# Patient Record
Sex: Male | Born: 1950 | Race: White | Hispanic: No | Marital: Married | Smoking: Former smoker
Health system: Southern US, Community
[De-identification: ages and names within clinical notes are randomized; demographics above are authoritative.]

## PROBLEM LIST (undated history)

## (undated) DIAGNOSIS — M1990 Unspecified osteoarthritis, unspecified site: Secondary | ICD-10-CM

## (undated) DIAGNOSIS — R351 Nocturia: Secondary | ICD-10-CM

## (undated) DIAGNOSIS — K219 Gastro-esophageal reflux disease without esophagitis: Secondary | ICD-10-CM

## (undated) DIAGNOSIS — M7030 Other bursitis of elbow, unspecified elbow: Secondary | ICD-10-CM

## (undated) DIAGNOSIS — R350 Frequency of micturition: Secondary | ICD-10-CM

## (undated) DIAGNOSIS — Z87448 Personal history of other diseases of urinary system: Secondary | ICD-10-CM

## (undated) DIAGNOSIS — K5730 Diverticulosis of large intestine without perforation or abscess without bleeding: Secondary | ICD-10-CM

## (undated) DIAGNOSIS — T8859XA Other complications of anesthesia, initial encounter: Secondary | ICD-10-CM

## (undated) DIAGNOSIS — G629 Polyneuropathy, unspecified: Secondary | ICD-10-CM

## (undated) DIAGNOSIS — J189 Pneumonia, unspecified organism: Secondary | ICD-10-CM

## (undated) DIAGNOSIS — Z8719 Personal history of other diseases of the digestive system: Secondary | ICD-10-CM

## (undated) DIAGNOSIS — Z973 Presence of spectacles and contact lenses: Secondary | ICD-10-CM

## (undated) DIAGNOSIS — F411 Generalized anxiety disorder: Secondary | ICD-10-CM

## (undated) DIAGNOSIS — Q600 Renal agenesis, unilateral: Secondary | ICD-10-CM

## (undated) DIAGNOSIS — Z87438 Personal history of other diseases of male genital organs: Secondary | ICD-10-CM

## (undated) DIAGNOSIS — E785 Hyperlipidemia, unspecified: Secondary | ICD-10-CM

## (undated) DIAGNOSIS — Z9889 Other specified postprocedural states: Secondary | ICD-10-CM

## (undated) DIAGNOSIS — R112 Nausea with vomiting, unspecified: Secondary | ICD-10-CM

## (undated) DIAGNOSIS — I2510 Atherosclerotic heart disease of native coronary artery without angina pectoris: Secondary | ICD-10-CM

## (undated) DIAGNOSIS — N201 Calculus of ureter: Secondary | ICD-10-CM

## (undated) DIAGNOSIS — D3502 Benign neoplasm of left adrenal gland: Secondary | ICD-10-CM

## (undated) DIAGNOSIS — G2581 Restless legs syndrome: Secondary | ICD-10-CM

## (undated) DIAGNOSIS — Z8489 Family history of other specified conditions: Secondary | ICD-10-CM

## (undated) DIAGNOSIS — T4145XA Adverse effect of unspecified anesthetic, initial encounter: Secondary | ICD-10-CM

## (undated) DIAGNOSIS — IMO0002 Reserved for concepts with insufficient information to code with codable children: Secondary | ICD-10-CM

## (undated) DIAGNOSIS — Z87442 Personal history of urinary calculi: Secondary | ICD-10-CM

## (undated) DIAGNOSIS — I10 Essential (primary) hypertension: Secondary | ICD-10-CM

## (undated) DIAGNOSIS — Z972 Presence of dental prosthetic device (complete) (partial): Secondary | ICD-10-CM

## (undated) DIAGNOSIS — K08109 Complete loss of teeth, unspecified cause, unspecified class: Secondary | ICD-10-CM

## (undated) DIAGNOSIS — Z8669 Personal history of other diseases of the nervous system and sense organs: Secondary | ICD-10-CM

## (undated) HISTORY — PX: KNEE ARTHROSCOPY: SUR90

## (undated) HISTORY — PX: ANTERIOR CERVICAL DECOMP/DISCECTOMY FUSION: SHX1161

## (undated) HISTORY — PX: HIP ARTHROSCOPY W/ LABRAL DEBRIDEMENT: SHX1749

## (undated) HISTORY — PX: OTHER SURGICAL HISTORY: SHX169

## (undated) HISTORY — PX: LUMBAR SPINE SURGERY: SHX701

## (undated) HISTORY — PX: TOTAL KNEE ARTHROPLASTY: SHX125

## (undated) HISTORY — PX: SHOULDER ARTHROSCOPY WITH SUBACROMIAL DECOMPRESSION, ROTATOR CUFF REPAIR AND BICEP TENDON REPAIR: SHX5687

---

## 2000-04-20 ENCOUNTER — Inpatient Hospital Stay (HOSPITAL_COMMUNITY): Admission: EM | Admit: 2000-04-20 | Discharge: 2000-04-23 | Payer: Self-pay | Admitting: Emergency Medicine

## 2000-04-20 DIAGNOSIS — Z8719 Personal history of other diseases of the digestive system: Secondary | ICD-10-CM

## 2000-04-20 HISTORY — DX: Personal history of other diseases of the digestive system: Z87.19

## 2001-02-20 ENCOUNTER — Encounter: Admission: RE | Admit: 2001-02-20 | Discharge: 2001-04-19 | Payer: Self-pay | Admitting: Orthopedic Surgery

## 2001-04-20 ENCOUNTER — Encounter: Admission: RE | Admit: 2001-04-20 | Discharge: 2001-04-20 | Payer: Self-pay | Admitting: Orthopedic Surgery

## 2001-05-18 ENCOUNTER — Encounter: Admission: RE | Admit: 2001-05-18 | Discharge: 2001-06-19 | Payer: Self-pay | Admitting: Orthopedic Surgery

## 2001-10-02 ENCOUNTER — Encounter: Payer: Self-pay | Admitting: Orthopedic Surgery

## 2001-10-04 ENCOUNTER — Ambulatory Visit: Admission: RE | Admit: 2001-10-04 | Discharge: 2001-10-04 | Payer: Self-pay | Admitting: Orthopedic Surgery

## 2001-10-09 ENCOUNTER — Inpatient Hospital Stay (HOSPITAL_COMMUNITY): Admission: RE | Admit: 2001-10-09 | Discharge: 2001-10-13 | Payer: Self-pay | Admitting: Orthopedic Surgery

## 2001-10-25 ENCOUNTER — Encounter: Admission: RE | Admit: 2001-10-25 | Discharge: 2002-01-23 | Payer: Self-pay | Admitting: Orthopedic Surgery

## 2001-11-28 ENCOUNTER — Observation Stay (HOSPITAL_COMMUNITY): Admission: RE | Admit: 2001-11-28 | Discharge: 2001-11-29 | Payer: Self-pay | Admitting: Orthopedic Surgery

## 2002-06-27 ENCOUNTER — Ambulatory Visit (HOSPITAL_BASED_OUTPATIENT_CLINIC_OR_DEPARTMENT_OTHER): Admission: RE | Admit: 2002-06-27 | Discharge: 2002-06-28 | Payer: Self-pay | Admitting: Orthopedic Surgery

## 2002-07-09 ENCOUNTER — Encounter: Admission: RE | Admit: 2002-07-09 | Discharge: 2002-08-23 | Payer: Self-pay | Admitting: Orthopedic Surgery

## 2003-08-06 ENCOUNTER — Encounter: Admission: RE | Admit: 2003-08-06 | Discharge: 2003-08-06 | Payer: Self-pay | Admitting: Orthopedic Surgery

## 2004-06-08 ENCOUNTER — Ambulatory Visit (HOSPITAL_COMMUNITY): Admission: RE | Admit: 2004-06-08 | Discharge: 2004-06-08 | Payer: Self-pay | Admitting: Gastroenterology

## 2006-05-17 ENCOUNTER — Encounter: Admission: RE | Admit: 2006-05-17 | Discharge: 2006-05-17 | Payer: Self-pay | Admitting: Orthopedic Surgery

## 2006-06-23 ENCOUNTER — Ambulatory Visit (HOSPITAL_COMMUNITY)
Admission: RE | Admit: 2006-06-23 | Discharge: 2006-06-23 | Payer: Self-pay | Admitting: Physical Medicine and Rehabilitation

## 2006-08-12 ENCOUNTER — Ambulatory Visit (HOSPITAL_COMMUNITY): Admission: RE | Admit: 2006-08-12 | Discharge: 2006-08-12 | Payer: Self-pay | Admitting: Orthopedic Surgery

## 2009-03-03 ENCOUNTER — Encounter: Admission: RE | Admit: 2009-03-03 | Discharge: 2009-03-03 | Payer: Self-pay | Admitting: Family Medicine

## 2009-05-21 ENCOUNTER — Inpatient Hospital Stay (HOSPITAL_COMMUNITY): Admission: RE | Admit: 2009-05-21 | Discharge: 2009-05-23 | Payer: Self-pay | Admitting: Neurosurgery

## 2010-11-23 ENCOUNTER — Other Ambulatory Visit: Payer: Self-pay | Admitting: Neurosurgery

## 2010-11-23 DIAGNOSIS — M542 Cervicalgia: Secondary | ICD-10-CM

## 2010-11-25 ENCOUNTER — Ambulatory Visit
Admission: RE | Admit: 2010-11-25 | Discharge: 2010-11-25 | Disposition: A | Discharge status: Home / Self Care | Payer: Medicare Other | Source: Ambulatory Visit | Attending: Neurosurgery | Admitting: Neurosurgery

## 2010-11-25 DIAGNOSIS — M542 Cervicalgia: Secondary | ICD-10-CM

## 2010-12-26 LAB — URINALYSIS, ROUTINE W REFLEX MICROSCOPIC
Bilirubin Urine: NEGATIVE
Ketones, ur: NEGATIVE mg/dL
Nitrite: NEGATIVE
Specific Gravity, Urine: 1.015 (ref 1.005–1.030)
Urobilinogen, UA: 0.2 mg/dL (ref 0.0–1.0)

## 2010-12-26 LAB — CBC
HCT: 43 % (ref 39.0–52.0)
Hemoglobin: 14.6 g/dL (ref 13.0–17.0)
MCHC: 34 g/dL (ref 30.0–36.0)
MCV: 95.2 fL (ref 78.0–100.0)
RDW: 13.5 % (ref 11.5–15.5)

## 2010-12-26 LAB — DIFFERENTIAL
Basophils Relative: 1 % (ref 0–1)
Lymphocytes Relative: 20 % (ref 12–46)
Lymphs Abs: 1.6 10*3/uL (ref 0.7–4.0)
Monocytes Relative: 8 % (ref 3–12)
Neutro Abs: 5.5 10*3/uL (ref 1.7–7.7)
Neutrophils Relative %: 70 % (ref 43–77)

## 2010-12-26 LAB — COMPREHENSIVE METABOLIC PANEL
Alkaline Phosphatase: 79 U/L (ref 39–117)
BUN: 11 mg/dL (ref 6–23)
Calcium: 9.9 mg/dL (ref 8.4–10.5)
Creatinine, Ser: 1.24 mg/dL (ref 0.4–1.5)
Glucose, Bld: 91 mg/dL (ref 70–99)
Total Protein: 6.4 g/dL (ref 6.0–8.3)

## 2010-12-26 LAB — PROTIME-INR: INR: 0.9 (ref 0.00–1.49)

## 2010-12-26 LAB — URINE MICROSCOPIC-ADD ON

## 2010-12-26 LAB — APTT: aPTT: 30 seconds (ref 24–37)

## 2011-01-18 ENCOUNTER — Other Ambulatory Visit: Payer: Self-pay | Admitting: Neurosurgery

## 2011-01-18 DIAGNOSIS — M47812 Spondylosis without myelopathy or radiculopathy, cervical region: Secondary | ICD-10-CM

## 2011-01-19 ENCOUNTER — Ambulatory Visit
Admission: RE | Admit: 2011-01-19 | Discharge: 2011-01-19 | Disposition: A | Discharge status: Home / Self Care | Payer: Medicare Other | Source: Ambulatory Visit | Attending: Neurosurgery | Admitting: Neurosurgery

## 2011-01-19 DIAGNOSIS — M47812 Spondylosis without myelopathy or radiculopathy, cervical region: Secondary | ICD-10-CM

## 2011-02-02 NOTE — H&P (Signed)
NAME:  EHAN, FREAS NO.:  [id number]   MEDICAL RECORD NO.:  [id number]          PATIENT TYPE:  INP   LOCATION:  3036                         FACILITY:  [facility]   PHYSICIAN:  Payton Doughty, M.D.      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   BODY OF TEXT:  This is a 60 year old gentleman who has undergone  numerous lumbar surgeries that have culminated in effusion at L3-4 and  L4-5.  His presentation is documented today for pain management and had  some difficulty with his shoulder and neck.  Underwent an MR that shows  spondylosis in disk at C5-6 and C6-7, he was sent to my office.  He had  weakness in the right upper extremity and is now admitted for an  anterior decompression and fusion at C5-6 and C6-7.   Medical history is remarkable for hypercholesterolemia.   Surgical history is remarkable for numerous back surgeries, none since  2002.   Allergies are none.   MEDICATIONS:  Tylenol No. 3 and simvastatin.   Family history is remarkable for heart disease and arthritis.   SOCIAL HISTORY:  He does not smoke, does not drink, and is on disability  because of his back.   Review of systems is remarkable for back and leg pain.   His HEENT exam is within normal limits.  He has good range of motion of  his neck with some reproduction of his arm pain.  Chest is clear.  Cardiac exam is regular rate and rhythm.  Neurologically, he is awake,  alert, and oriented.  Cranial nerves are intact.  Motor exam shows 5/5  strength throughout the upper and lower extremities, say for the right  triceps which is 5-/5.  Sensory dysesthesia described in the right C5  and C6 distribution.  Reflexes are 1 throughout, say for the right  triceps which is absent.   MR shows disks at 5-6 and 6-7, mostly has right foraminal narrowing at  both  levels.   CLINICAL IMPRESSION:  Right C6-7 radiculopathy related to disk.   The plan is for an anterior decompression and fusion at C5-6 and C6-7.  The risks and benefits have been discussed with him, he wished  to  proceed.            ______________________________  Payton Doughty, M.D.     MWR/MEDQ  D:  05/21/2009  T:  05/21/2009  Job:  [id number]

## 2011-02-02 NOTE — Op Note (Signed)
NAME:  Bradley Morgan, Bradley Morgan NO.:  [id number]   MEDICAL RECORD NO.:  [id number]          PATIENT TYPE:  INP   LOCATION:  3036                         FACILITY:  [facility]   PHYSICIAN:  Payton Doughty, M.D.      DATE OF BIRTH:  08/31/1951   DATE OF PROCEDURE:  05/21/2009  DATE OF DISCHARGE:                               OPERATIVE REPORT   PREOPERATIVE DIAGNOSIS:  Spondylosis and disk C5-6, C6-7.   POSTOPERATIVE DIAGNOSIS:  Spondylosis and disk C5-6, C6-7.   OPERATIVE PROCEDURE:  C5-6, C6-7 anterior cervical decompression and  fusion with an Aviator plate.   SURGEON:  Payton Doughty, MD   ANESTHESIA:  General endotracheal.   PREPARATION:  Prepped and draped with alcohol wipe.   COMPLICATIONS:  None.   ASSISTANT:  Stefani Dama, MD   BODY OF TEXT:  A 60 year old with severe cervical spondylosis and disk  at C5-6 and C6-7.   Taken to operating room, smoothly anesthetized and intubated, placed  spine on the operating table in the halter head traction with the neck  slightly extended.  Following shave, prep, and drape in the usual  sterile fashion, skin was incised in midline to medial border of the  sternocleidomastoid muscle on the left side.  The platysma was  identified, elevated, divided, and undermined.  Sternocleidomastoid was  identified.  Medial dissection revealed the carotid artery, retracted  laterally to left, trachea and esophagus retracted laterally to the  right, exposing the bones in the anterior cervical spine.  Marker was  placed.  Intraoperative x-ray obtained to confirm correctness of level.  Having confirmed correctness of level, the longus colli was taken down  bilaterally and the Shadow-Line self-retaining retractor placed  transversely in a cephalocaudal direction.  This exposed both 5-6 and 6-  7.  Diskectomy was carried out under gross observation at both levels as  well as curettage of the endplates.  The operating microscope was then  brought  in.  We used microdissection technique to remove the remaining  disks and divided the posterior longitudinal ligament, explored the  neural foramina bilaterally.  Following complete decompression and  removal of disk, wound was irrigated.  Hemostasis was assured.  Both  levels, there was disk.  There was more osteophyte at C6-7.  Following  complete decompression, 8-mm bone grafts were fashioned, patellar  allograft and tapped into place.  A 37-mm Aviator plate was then  selected using 12-mm screws, two in C5, two in C6, and two in C7.  Locking  mechanism was applied.  Intraoperative x-ray showed good placement of  bone graft, plate, and screws.  Successive layers of 3-0 Vicryl and 4-0  Vicryl were used to close.  Benzoin and Steri-Strips were placed and  made occlusive with Telfa and OpSite.  The patient returned to recovery  room in an Aspen collar.           ______________________________  Payton Doughty, M.D.     MWR/MEDQ  D:  05/21/2009  T:  05/22/2009  Job:  [id number]

## 2011-02-05 NOTE — Consult Note (Signed)
[place]  Patient:    Bradley, Morgan                      MRN: [id number] Proc. Date: 04/20/00 Adm. Date:  [id number] Attending:  Wilson Singer CC:         Teena Irani. Arlyce Dice, M.D., [facility]  Lilly Cove, M.D.  Jimmye Norman, M.D.   Consultation Report  DATE OF BIRTH:  [birth year]  CHIEF COMPLAINT:  Heart racing, black stool, and weakness.  ASSESSMENT: 1. Severe anemia with hemoglobin of 4.3 g/dl with melenic stool for the last    three days and history of nonsteroidal use, rule out peptic ulcer disease.    BUN is 29. 2. History of gastroesophageal reflux disease on Alka-Seltzer. 3. History of arthritis. 4. History of five back surgeries for ruptured disks in lumbosacral spine.  RECOMMENDATIONS: 1. Transfuse 4 units of blood ASAP. 2. Emergent EGD to control bleeding. 3. N.P.O. for now. 4. Protonix 40 mg IV q.d. 5. ICU bed.  HISTORY OF PRESENT ILLNESS:  [name] is a 60 year old white male who was in his usual state of health until about three days ago when he started noticing black stool. He felt weak but denies syncope or near-syncopal episodes. This morning, he felt his heart racing and presented to the emergency room at [facility]. On evaluation, he was found to have a hemoglobin of 4.3 g/dl with melenic, guaiac-positive stool on rectal exam which prompted a GI referral. The patient denies any nausea, vomiting, constipation, or diarrhea. His appetite has been fairly good. There is no history of weight loss or weight gain. There is no history of previous ulcer, jaundice, colitis. The patient denies odynophagia or dysphagia. He does have some reflux. There is no history of genitourinary or cardiorespiratory complaints, except for some palpitations experienced this morning. He denies any chest pain. He has a history of arthritis. There are no vascular or neurologic problems.  ALLERGIES:  No known drug  allergies.  CURRENT MEDICATIONS:  Over-the-counter Alka-Seltzer. The patient does not take any other medications on a regular basis.  SOCIAL HISTORY:  He is married with four children and lives in [place], [place]. He smokes two packs per day for over 20 years. He drinks alcohol but denies alcohol abuse. He did drink heavily several years ago but has not done so in the recent past. He is on disability for his back and is also retired from work prematurely. He denies the use of street drugs.  FAMILY HISTORY:  His father is a diabetic but is otherwise healthy. His mother is alive as well. He has one brother and two sisters, all of whom are healthy. There is no family history of cancer or heart disease.  REVIEW OF SYSTEMS: 1. Black stool. 2. Weakness. 3. Palpitations.  PHYSICAL EXAMINATION:  GENERAL:  Reveals a middle-aged, white male who is very pale and anxious in no acute distress.  VITAL SIGNS:  Temperature 97/4, blood pressure 81/40, respiratory rate 22 per minute, pulse 124 per minute.  HEENT:  NCAT. PERRLA. EOMI. ______  NECK:  Palpable. No JVD, thyromegaly, or lymphadenopathy.  CHEST:  Clear to auscultation. No rhonchi, wheezing, or rales.  HEART:  S1/S2 regular. No murmurs, rubs, or gallops.  ABDOMEN:  Soft, nontender with normal abdominal bowel sounds. No hepatosplenomegaly. No masses palpable.  RECTAL:  As per the ER physician revealed black, guaiac-positive stools. No other masses palpable.  LABORATORY  DATA:  Revealed hemoglobin of 4.3 g/dl, with hematocrit 45.4, white count 10.3000, platelets 240,000, 81% neutrophils. Sodium 137, potassium 3.9, chloride 106, CO2 26, BUN 29, creatinine 0.8, glucose 115. PT and PTT were within normal limits with a normal INR.  PLAN:  As outlined above.  FURTHER RECOMMENDATIONS:  EGD is being done. DD:  04/20/00 TD:  04/20/00 Job: [id number]

## 2011-02-05 NOTE — Discharge Summary (Signed)
[place]  Patient:    Bradley Morgan, Bradley Morgan                      MRN: [id number] Adm. Date:  [id number] Disc. Date: [id number] Attending:  Wilson Singer CC:         Delorse Lek, M.D.   Discharge Summary  FINAL DISCHARGE DIAGNOSIS:  Upper gastrointestinal bleed secondary to bleeding duodenal ulcer.  CONDITION ON DISCHARGE:  Stable.  DISCHARGE MEDICATIONS:  Protonix 40 mg q.d.  HISTORY OF PRESENT ILLNESS:  This 60 year old man presented with palpitations, melena, and weakness.  He was found to have a hemoglobin of 4.3 in the emergency room.  Please see initial history and physical examination for initial evaluation.  HOSPITAL COURSE:  He had been ingesting many nonsteroidal anti-inflammatory drugs prior to his episode.  He was seen by Dr. Carolyne Littles, gastroenterologist, who performed upper GI endoscopy.  This showed a duodenal ulcer which had been bleeding.  He was able to inject it and, therefore, cauterize it.  This stopped the bleeding.  Overall, he required approximately nine units of blood during his hospital stay.  On the date of his discharge, his hemoglobin was 9.9, and repeat upper GI endoscopy showed no further bleeding.  He was able to be safely sent home on Protonix 40 mg per day and was asked to follow up with Dr. Carolyne Littles in our office.  He was also asked to follow up with his primary care physician also in one to two weeks time. DD:  05/25/00 TD:  05/26/00 Job: [id number]

## 2011-02-05 NOTE — Op Note (Signed)
NAME:  RAIMUNDO, CORBIT                         ACCOUNT NO.:  [id number]   MEDICAL RECORD NO.:  [id number]                   PATIENT TYPE:  AMB   LOCATION:  DSC                                  FACILITY:  [facility]   PHYSICIAN:  Ollen Gross, MD                   DATE OF BIRTH:  1951/07/17   DATE OF PROCEDURE:  06/27/2002  DATE OF DISCHARGE:                                 OPERATIVE REPORT   PREOPERATIVE DIAGNOSES:  1. Right shoulder impingement.  2. Acromioclavicular arthrosis.  3. Rotator cuff tear.  4. Biceps tendon tear.   POSTOPERATIVE DIAGNOSES:  1. Right shoulder impingement.  2. Acromioclavicular arthrosis.  3. Rotator cuff tear.  4. Biceps tendon tear.   OPERATION:  1. Right shoulder scope.  2. Labral debridement.  3. Subacromial decompression.  4. Distal clavicle resection.  5. Biceps tenodesis.  6. Rotator cuff repair.   SURGEON:  Ollen Gross, M.D.   ASSISTANT:  [name]. Julien Girt, P.A.   ANESTHESIA:  Intrascalene general   ESTIMATED BLOOD LOSS:  Minimal   DRAINS:  None   COMPLICATIONS:  None   CONDITION:  Stable to recovery.   BRIEF CLINICAL NOTE:  The patient is a 60 year old male who has a several  month history of progressively worsening right shoulder pain which has been  refractory to nonoperative management including injections.  Exam and  history is consistent with rotator cuff tear confirmed by MRI.  He presents  now for the above mentioned procedure.   DESCRIPTION OF PROCEDURE:  After successful administration of intrascalene  block and general anesthetic, the patient was placed sitting upright in a  beach chair positioner and right upper extremity shoulder girdle isolated  and prepped and draped in the usual sterile fashion.  Standard arthroscopic  landmarks were marked and incision made for posterior portal.  Cannula was  passed into the joint with the camera. Arthroscopic visualization proceeded.  He had a tremendous amount of  frayed tissue inside his glenohumeral joint,  as well as superiorly with fraying as well as almost full tear of the biceps  tendon.  Intra-articular subcapsulars had fraying without complete tear.  The glenoid and humeral head surfaces looked normal.  Under surface of  supraspinatus also shows a tremendous amount of fraying consistent with high  grade partial tear.  Spinal needle was used to localize the anterior portal.  Small incision made.  Cannula passed and then the shaver used to debride  down the frayed labrum and frayed tissue.  The ArthroCare device is used to  essentially seal that tissue.  The ArthroCare was then used to release the  biceps from the superior glenoid.  The tendon is shown to exit through the  biceps aperture.  Under surface of supraspinatus is then debrided also with  the ArthroCare.  At this point, the glenohumeral joint is exited and  subacromial space entered.  Soft  tissue decompression is performed with the  ArthroCare.  A lateral portal is then established after isolation of the  spinal needle.  A small incision was made and then the remainder of the soft  tissue decompression completed through the lateral portal.  A 4 mm bur is  then placed through the lateral portal and a subacromial bony decompression  performed to create a flat acromial undersurface.  Anterior portal is then  utilized to resect the distal clavicle to create about a 1 cm space between  the medial acromion and distal clavicle.  Once this is completed, then the  arthroscopic equipment is removed from the subacromial space and anterior  incision made.  About a 3 cm incision was made along the anterior lateral  tip of the acromion forcing in the skin lines.  The skin was cut with #10  blade through subcutaneous tissues to the deltoid fascia which was incised.  Raphe was identified between the deltoid fibers and this is dissected down  to the humeral head.  The supraspinatus is visualized and  there is a tear  which is high grade partial.  We debrided this back to normal appearing  tissue.  A small trough is created and Mitek anchor placed.  The tendon is  sutured down into the trough utilizing the sutures attached to the anchor.  This was felt to be a very stable repair.  The bicipital groove was then  visualized.  Biceps tendon identified, placed under tension and then a Mitek  anchor again placed into the groove and then the tendon sewn down to bone to  perform a tenodesis.  It was then oversewn with #1 Ethibon for a stable  repair.  Subscapularis was visualized and palpated and there was no  subscapularis tear.  The wound was irrigated and then the subcutaneous  tissue closed with interrupted 2-0 Vicryl, subcuticular closed with running  4-0 Monocryl.  The portal sites were closed with interrupted 4-0 Nylon.  Steri-Strips and bulky sterile dressing applied.  He was placed into a  sling, awakened and transported to recovery in stable condition.                                                 Ollen Gross, MD    FA/MEDQ  D:  06/27/2002  T:  06/28/2002  Job:  [id number]

## 2011-02-05 NOTE — Procedures (Signed)
[place]. [facility]  Patient:    Bradley Morgan, Bradley Morgan                      MRN: [id number] Proc. Date: 04/20/00 Adm. Date:  [id number] Attending:  Wilson Singer CC:         Teena Irani. Arlyce Dice, M.D.             Lilly Cove, M.D.                           Procedure Report  DATE OF BIRTH:  1951/05/03  REFERRING PHYSICIAN:  PROCEDURE PERFORMED:  Esophagogastroduodenoscopy.  ENDOSCOPIST:  Anselmo Rod, M.D.  INSTRUMENT USED:  Olympus video panendoscope.  INDICATIONS FOR PROCEDURE:  Ongoing evidence of GI bleeding in a 60 year old white male with a history of known duodenal ulcer with a visible vessel injected yesterday.  Repeat EGD is ____________ relook.  The patient has not had an appropriate increase in his hemoglobin after the blood transfusion and has been having ongoing melenicstools.  PREPROCEDURE PREPARATION:  Informed consent was procured from the patient. The patient was fasted for eight hours prior to the procedure.  PREPROCEDURE PHYSICAL:  The patient had stable vital signs.  Neck supple. Chest clear to auscultation.  S1, S2 regular.  Abdomen soft with normal abdominal bowel sounds.  DESCRIPTION OF PROCEDURE:  The patient was placed in left lateral decubitus position and sedated with 60 mg of Demerol and 6 mg of Versed intravenously. Once the patient was adequately sedated and maintained on low-flow oxygen and continuous cardiac monitoring, the Olympus video panendoscope was advanced through the mouthpiece, over the tongue, into the esophagus under direct vision.  The entire esophagus appeared normal without evidence of ring, stricture, masses, lesions or esophagitis.  The scope was then advanced to the stomach.  The entire gastric mucosa appeared healthy.  There was no evidence of any fresh bleeding from the gastric mucosa.  The duodenal bulb showed evidence of a healing ulcer without any evidence of a visible vessel at this time.   Considering the clean base of the ulcer, no intervention was undertaken.  The patient tolerated the procedure well without complications.  IMPRESSION:  Healing duodenal ulcer with no evidence of visible vessel at this time.  RECOMMENDATIONS: 1. Change Protonics from IV to p.o. 2. Allow patient some clear liquids now. 3. CBC after two units of packed red blood cells. 4. Heplock IV fluids. 5. Advance diet as tolerated.  RECOMMENDATION: DD:  04/21/00 TD:  04/24/00 Job: [id number]

## 2011-02-05 NOTE — Discharge Summary (Signed)
[facility]  Patient:    Bradley Morgan, Bradley Morgan Visit Number: [id number] MRN: [id number]          Service Type: SUR Location: 4W 0470 01 Attending Physician:  Loanne Drilling Dictated by:   Sammuel Cooper Mahar, P.A.-C Admit Date:  10/09/2001 Discharge Date: 10/13/2001                             Discharge Summary  DATE OF BIRTH:  06-08-2051  ADMISSION DIAGNOSES: 1. Left knee severe osteoarthritis. 2. History of upper gastrointestinal bleed in August 2002.  DISCHARGE DIAGNOSES: 1. Status post left total knee arthroplasty, doing very well. 2. Postoperative hemorrhagic anemia which is stable, asymptomatic, and did not    require a blood transfusion. 3. Postoperative fever, resolved prior to discharge.  PROCEDURE:  Left total knee arthroplasty.  This was done by Dr. Ollen Gross with the assistance of Irena Cords, P.A.-C.  General anesthesia was used. There were no intraoperative complications.  CONSULTATIONS:  None.  HISTORY OF PRESENT ILLNESS:  The patient is a 60 year old male with a long history of left knee pain and disability prior to admission.  It had been increasing to the point that the pain is almost constant in nature.  He had been having night pain and pain at rest.  It had gotten to the point that it is interfering with his activities of daily living and quality of life.  He had failed conservative management.  Risks and benefits of the proposed surgery were discussed with the patient by Dr. Ollen Gross.  He indicated understanding and opted to proceed.  LABORATORY DATA:  On 10/02/01, CBC was within normal limits.  This was repeated on 10/09/01, and was within normal limits with the exception of red blood cells of 3.99, hemoglobin 12.4, and hematocrit of 36.9.  Hemoglobin and hematocrit were followed through his postoperative course, gradually decreasing down to 9.3 and 28.0 on 10/12/01, however, he was stable and asymptomatic at  this point, and did not require a blood transfusion.  CBC was repeated on 10/13/01, and was within normal limits with the exception of red blood cells of 2.86, hemoglobin of 8.9, and hematocrit of 26.6.  On 10/02/01, PT, INR, and PTT were all within normal limits.  Comprehensive metabolic panel from 10/02/01, was within normal limits with the exception of a glucose of 115, which is slightly elevated.  Basic metabolic panel was monitored on 10/09/01, was within normal limits, with the exception of glucose of 133.  On 10/10/01, sodium was down to 133, glucose was 139, otherwise normal.  On 10/13/01, the basic metabolic panel was within normal limits.  UA from 10/02/01, was negative with the exception of trace hemoglobin and 100 protein.  On 10/09/01, the patients blood type is ArH+, antibody screen negative.  X-rays from 10/02/01, of the chest revealed no acute disease.  No EKG seen on the chart.  HOSPITAL COURSE:  The patient was admitted to the hospital on 10/09/01, was taken to the operating room for the above listed procedure.  He tolerated the procedure very well without any intraoperative complications.  There was one Hemovac drain placed.  Tourniquet time was 62 minutes.  The patient was transferred to the recovery room in stable condition.  Appropriate antibiotics were started postoperatively.  He completed this course without any difficulty or complication.  Pain control was obtained utilizing PCA Dilaudid initially. He was transitioned over to p.o. analgesics, and his  pain control did remain adequate.  His diet was gradually advanced without any difficulty.  Bedside incentive spirometry was used utilized postoperatively.  Neurovascular checks were instituted, and remained intact without any difficulty.  The patient was started on Erixstra postoperatively, was discharged home on this without any difficulty or complications.  Physical therapy and occupational therapy were consulted.  They  assisted the patient with progressive ambulation as well as total knee protocol.  He did very well with physical therapy, eventually increasing up to ambulation of well over 100 feet independently.  The patients operative dressing was taken down on postoperative day #2, to reveal a good looking incision, no signs or symptoms of infection.  Daily dressing changes were done thereafter, and he continued to do very well without any problems.  The patient did develop a low grade fever through postoperative #2 and 3, this was felt to be likely secondary to atelectasis.  Bedside incentive spirometer was encouraged.  His temperature did resolve prior to discharge.  A CPM machine was started on the operative day.  He tolerated that relatively well through his postoperative course.  Hemovac drain was discontinued on postoperative day #2, without any difficulty or complication.  Discharge planning was consulted to assist with arranging the patients home needs, as well as home health services.  Home health physical therapy and nursing was arranged prior to the patient being discharged.  By 10/13/01, the patient had met all orthopedic goals, he was medically stable and ready for discharge to his home.  DISCHARGE MEDICATIONS: 1. Percocet 5/325 one or two tablets p.o. q.4-6h. p.r.n. pain. 2. Robaxin 500 mg one tablet p.o. q.6-8h. p.r.n. spasm. 3. Erixstra per protocol per study. 4. Over-the-counter iron supplement.  ACTIVITY:  Total knee protocol.  Weightbearing as tolerated to the operative extremity.  WOUND CARE:  Dressing changes should be done on a daily basis.  The patient may shower.  FOLLOWUP:  The patient was to follow up on October 24, 2001, with Dr. Ollen Gross.  He is instructed to call for a time for his appointment.  DIET:  He is to resume his preoperative diet as tolerated.  CONDITION ON DISCHARGE:  Stable and improved.  DISPOSITION:  The patient is being discharged to his home  with home health physical therapy and nursing per [place].  It was arranged for the patient get  an hemoglobin and hematocrit drawn on Monday, 10/16/01, by [facility].Dictated by:   Sammuel Cooper. Mahar, P.A.-C Attending Physician:  Loanne Drilling DD:  11/21/01 TD:  11/21/01 Job: [id number]

## 2011-02-05 NOTE — Op Note (Signed)
[facility]  Patient:    Bradley Morgan, Bradley Morgan Visit Number: [id number] MRN: [id number]          Service Type: SUR Location: 4W 0470 01 Attending Physician:  Loanne Drilling Dictated by:   Ollen Gross, M.D. Proc. Date: 10/09/01 Admit Date:  10/09/2001                             Operative Report  PREOPERATIVE DIAGNOSIS:  Osteoarthritis, left knee.  POSTOPERATIVE DIAGNOSIS:  Osteoarthritis, left knee.  PROCEDURE:  Left total knee arthroplasty.  SURGEON:  Ollen Gross, M.D.  ASSISTANT:  Marcie Bal. Troncale, P.A.-C.  ANESTHESIA:  General.  ESTIMATED BLOOD LOSS:  Minimal.  DRAIN:  Hemovac x 1.  COMPLICATIONS:  None.  TOURNIQUET TIME:  62 minutes at 350 mmHg.  CONDITION:  Stable to recovery.  BRIEF CLINICAL NOTE:  Bradley Morgan is a 61 year old  male who has had two previous knee arthroscopies with significantly degenerative lateral patellofemoral compartment. He has had pain, which is now refractory to nonoperative management. He presents now for left total knee arthroplasty.  PROCEDURE IN DETAIL:  After successful administration of general anesthetic, a tourniquet is placed high on the left thigh and left lower extremity, prepped and draped in the usual sterile fashion. Extremity is wrapped in Esmarch, knee flexed, and tourniquet inflated to 350 mmHg. Standard midline incision is made; the skin cut with #10 blade through subcutaneous tissue to the level of the extensor mechanism. A fresh blade is used to make a medial parapatellar arthrotomy and the soft tissue on the proximomedial tibia is subperiosteally elevated to the joint line with the knife. We did a minimal elevation medially as he had a slight valgus deformity. Soft tissue of the proximolateral tibia is elevated, attention being paid to avoid the patellar tendon on tibial tubercle. The patella is then everted, knee flexed, and ACL and PCL removed. The drill is used to create a starting  hole in the distal femur, canal irrigated and the 5 degree left valgus alignment guide placed. Referencing off the posterior condyles, rotation is marked and 11 mm is then resected off the distal femur. Sizing block is placed and size 5 is most appropriate. The rotation corresponds with the epicondylar axis. AP cuts are then made.  Tibia is subluxed forward and the menisci are removed. Extramedullary tibial alignment guide is placed and referencing proximally at the medial third of the tibial tubercle and distally on the second metatarsal axis and tibial crest. The block is subsequently _______ to remove 10 mm of the nondeficient medial side. Resection is made with an oscillating saw. The size 5 was the most appropriate tibial component. The chamfer block and the intercondylar block are then placed on the distal femur and intercondylar and chamfer cuts made. Trial size 5 posterior stabilizer, femur size 5 mobile bearing tibial tray and 10 mm posterior stabilizer rotating platform insert trial are placed. He had full extension, excellent varus/valgus balance, past 100 degrees of flexion. Patella was everted again, thickness measured to be 27, and free hand resection taken down to 15 mm. A 41 mm template is placed and the lug holes are drilled. The trial patella is placed and tracks normally.  Proximal tibia is prepared with the modular drill and keel punch. Osteophytes are removed off the posterior femur with the femoral trial in place. All trials are removed and cut bone surfaces are prepared with pulsatile lavage. Cement is mixed and  once ready for implantation, size 5 mobile bearing tibial tray, size 5 posterior stabilized femur and a 41 mm patella is cemented into place. Patella is held with a clamp. Once the cement is fully hardened and the permanent 10 mm posterior stabilizer rotating platform insert is placed. Wound is copiously irrigated with antibiotic solution and the extensor  mechanism closed over Hemovac drain with interrupted #1 PDS. Tourniquet is loosened with total time of 62 minutes. Subcutaneous is closed with interrupted 2-0 Vicryl, subcuticular with running 4-0 Monocryl. The incision is clean and dry and Steri-Strips and a bulky sterile dressing applied. The patient is subsequently awakened and transported to recovery in stable condition. Dictated by:   Ollen Gross, M.D. Attending Physician:  Loanne Drilling DD:  10/09/01 TD:  10/10/01 Job: [id number]

## 2011-02-05 NOTE — Procedures (Signed)
[place]. [facility]  Patient:    Bradley Morgan, Bradley Morgan                      MRN: [id number] Proc. Date: 04/20/00 Adm. Date:  [id number] Attending:  Wilson Singer CC:         Teena Irani. Arlyce Dice, M.D. - [facility]             Lilly Cove, M.D.                           Procedure Report  DATE OF BIRTH:  08-24-1951  PROCEDURE PERFORMED:  Esophagogastroduodenoscopy with injection of a visible vessel in the duodenal ulcer.  ENDOSCOPIST:  Anselmo Rod, M.D.  INSTRUMENT USED:  Olympus video panendoscope.  INDICATIONS FOR PROCEDURE:  Severe anemia with melanic stool and hypotension with a hemoglobin of 4.3 g/dl in a 60 year old white male with a history of nonsteroidal use.  He has increased BUN of 29.  The patient has a history of black stools for the last three day and has been hypertensive since arrival in the ER.  He is receiving his fifth unit of packed red blood cells.  PREPROCEDURE PREPARATION:  Informed consent was procured from the patient and wife.  Risks and benefits of the procedure were discussed with the family. The patient was in the fasting state prior to the procedure.  PREPROCEDURE PHYSICAL:  VITAL SIGNS:  The patient had hypotension with blood pressure of 80/40, but had been slightly improved with a liter of IV fluids, and before the EGD was done, his blood pressure was 107/60, pulse 119 per minute, respiratory rate 24 per minute.  HEENT:  PERRLA.Anic.EOMI.  NECK:  Supple.  CHEST:  Clear to auscultation, S1 and S2 regular.  ABDOMEN:  Soft and nontender with normal abdominal bowel sounds.  DESCRIPTION OF PROCEDURE:  The patient was placed in the left lateral decubitus position, and sedated with 20 mg of Demerol and 4 mg of Versed intravenously.  Once the patient was adequately sedated, maintained on low flow oxygen and continuous cardiac monitoring, the Olympus video panendoscope was advanced through the mouth  piece over the tongue into the esophagus under direct vision.  The entire esophagus appeared normal without evidence of ring, stricture, masses, lesions, or esophagitis.  The scope was then advanced in the stomach.  A small hiatal hernia was seen.  Fresh blood was seen refluxing back into the stomach from the proximal small bowel.  On examining the small bowel, there was a lipomatous lesion in the duodenal bulb, but did not seem to have any evidence of bleeding around it.  A small ulcer was seen in the duodenal bulb with a visible vessel.  This was injected with 6 cc of epinephrine.  The small bowel distal to the bulb at ____ cm appeared normal. No masses or polyps were seen.  IMPRESSION: 1. Normal-appearing esophagus and stomach. 2. A small ulcer in duodenal bulb with visible vessel injected with 6 cc of    epinephrine and bleeding controlled. 3. Normal small bowel distal to the bulb. 4. Questionable lipomatous lesion in duodenal bulb.  RECOMMENDATIONS: 1. Serial CBCs, transfuse as needed to keep the hematocrit above 25%. 2. No nonsteroidals. 3. IV Protonix 40 mg one p.o. q.d. 4. ICU bed. 5. Repeat EGD if the patient has evidence of recurrent bleeding. DD:  04/20/00 TD:  04/21/00 Job: [id number]

## 2011-02-05 NOTE — Op Note (Signed)
NAME:  Bradley Morgan, Bradley Morgan                         ACCOUNT NO.:  [id number]   MEDICAL RECORD NO.:  [id number]                   PATIENT TYPE:  AMB   LOCATION:  ENDO                                 FACILITY:  [facility]   PHYSICIAN:  Anselmo Rod, M.D.               DATE OF BIRTH:  October 12, 1950   DATE OF PROCEDURE:  06/08/2004  DATE OF DISCHARGE:                                 OPERATIVE REPORT   PROCEDURE PERFORMED:  Screening colonoscopy.   ENDOSCOPIST:  Charna Elizabeth, M.D.   INSTRUMENT USED:  Olympus video colonoscope.   INDICATION FOR PROCEDURE:  A 60 year old white male with a history of guaiac  positive stools, rule out colonic polyps, masses, etc.   PREPROCEDURE PREPARATION:  Informed consent was procured from the patient.  The patient was fasted for 8 hours prior to the procedure and prepped with a  bottle of magnesium citrate and a gallon of GoLYTELY the night prior to the  procedure.   PREPROCEDURE PHYSICAL:  Patient stable vital signs.  NECK:  Supple.  CHEST:  Clear to auscultation.  S1, S2 regular.  ABDOMEN:  Soft with normal bowel sounds.   DESCRIPTION OF THE PROCEDURE:  The patient was placed in the left lateral  decubitus position, sedated with 100 mg of Demerol and 10 mg of Versed in  slow incremental doses.  Once the patient was adequately sedated and  maintained on low flow oxygen and continuous cardiac monitoring.  The  Olympus video colonoscope was advanced from the rectum to the cecum.  With  difficulty, the patient's position had to be changed from the left lateral  to the supine and the right lateral position with gentle application of  abdominal pressure to reach the cecum.  There was a large amount of residual  stool in the colon, multiple washings were done.  The appendiceal orifice  and ileocecal valve were visualized and photographed.  Small lesions could  be missed.  Retroflexion of the rectum revealed small internal hemorrhoids.  There was scattered  diverticula throughout the colon in various stages of  formation.   IMPRESSION:  1.  Small internal hemorrhoids.  2.  Scattered diverticulosis throughout the colon.  3.  Large amount of residual stool in the colon, small lesions could have      been missed.  Multiple washings done.   RECOMMENDATIONS:  Repeat guaiac testing will be done on an outpatient basis  in the next two weeks.  Further recommendations will be made thereafter.  A  repeat colonoscopy has been recommended in the next five years, sooner if  the patient develops any abnormal symptoms in the interim.      JNM/MEDQ  D:  06/08/2004  T:  06/08/2004  Job:  [id number]   cc:   Teena Irani. Arlyce Dice, M.D.  [address]  [place]  [place] [address]  Fax: [phone]

## 2011-02-05 NOTE — Op Note (Signed)
[facility]  Patient:    Bradley Morgan, Bradley Morgan Visit Number: [id number] MRN: [id number]          Service Type: SUR Location: 1W 4782 01 Attending Physician:  Loanne Drilling Dictated by:   Ollen Gross, M.D. Proc. Date: 11/28/01 Admit Date:  11/28/2001 Discharge Date: 11/29/2001                             Operative Report  PREOPERATIVE DIAGNOSIS:  Arthrofibrosis, left total knee.  POSTOPERATIVE DIAGNOSIS:  Arthrofibrosis, left total knee.  PROCEDURE:  Post manipulation, left knee.  SURGEON:  Ollen Gross, M.D.  ANESTHESIA:  General.  COMPLICATIONS:  None.  CONDITION:  Stable to recovery.  BRIEF CLINICAL NOTE:  Mr. Leider is a 60 year old male who underwent a left total knee arthroplasty on January 27. He had an uneventful postoperative course but has had stiffness in the knee. He has tried physical therapy without benefit. He has range of motion of approximately 10 to 80. He presents now for closed manipulation.  PROCEDURE IN DETAIL:  After successful administration of mask anesthetic, the knee is manipulated by applying a flexion force with my chest on his proximal tibia. You could audibly hear the adhesions lysing. _______ flexed 115 degrees. I was also able to get him extended to within 5 degrees of full extension. He is subsequently awakened and transported to recovery in stable condition. Dictated by:   Ollen Gross, M.D. Attending Physician:  Loanne Drilling DD:  11/28/01 TD:  11/29/01 Job: [id number]

## 2011-02-05 NOTE — H&P (Signed)
[place]. [facility]  Patient:    Bradley Morgan, Bradley Morgan                      MRN: [id number] Adm. Date:  [id number] Attending:  Wilson Singer CC:         Delorse Lek, M.D. - [facility]   History and Physical  HISTORY OF PRESENT ILLNESS:  This is a 60 year old man who presents with complaints of palpitations, black stool, and weakness.  He was found to have a hemoglobin f 4.3 in the emergency room and he was hemodynamically unstable, with a blood pressure of 81/40.  He has a history of aspirin ingestion recently, for his chronic back pain.  He also has a history of tobacco abuse, but no history of excessive  alcohol abuse.  He has no previous history of peptic ulcer.  PAST MEDICAL HISTORY:  Several back surgeries.  No other serious illnesses or operations.  CURRENT MEDICATIONS: 1. Aspirin. 2. Tylenol #3.  ALLERGIES:  No known drug allergies.  SOCIAL HISTORY:  He is a married man who currently is on disability.  He smokes but does not have a history of alcohol abuse.  FAMILY HISTORY:  Noncontributory.  REVIEW OF SYSTEMS:  Apart from that mentioned above, there are no other symptoms on the review of all other systems.  PHYSICAL EXAMINATION:  GENERAL:  He is seen after he has had 5 units of blood, and now he looks hemodynamically more stable, compared to the description previously.  VITAL SIGNS:  Blood pressure 105/65, pulse 70 per minute and regular.  HEART:  Sounds present and normal.  No murmurs.  LUNGS:  Fields are clear.  ABDOMEN:  Soft, mildly tender in the epigastric area.  NEUROLOGIC:  He is alert and oriented with no focal neurological signs.  INVESTIGATIONS:  Post-transfusion hemoglobin 8.  Upper GI endoscopy was done by Dr. Anselmo Rod, and she found a duodenal ulcer with active bleeding, and she has injected epinephrine.  IMPRESSION/PLAN:  Upper gastrointestinal bleed, secondary to duodenal  ulcer bleeding, hemodynamically stable currently.  PLAN:  Will watch for further bleeding, and his hemoglobin.  Further recommendations will depend upon his progress. DD:  04/20/00 TD:  04/20/00 Job: [id number]

## 2011-02-05 NOTE — Op Note (Signed)
NAME:  [name], [name] NO.:  [id number]   MEDICAL RECORD NO.:  [id number]          PATIENT TYPE:  AMB   LOCATION:  DAY                          FACILITY:  [facility]   PHYSICIAN:  Ollen Gross, M.D.    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   PROCEDURE:  Left hip arthroscopy with labral debridement and chondroplasty.   SURGEON:  Ollen Gross, M.D.   ASSISTANT:  Avel Peace, PA-C   ANESTHESIA:  General.   ESTIMATED BLOOD LOSS:  Minimal.   COMPLICATIONS:  None.   CONDITION:  Stable to recovery room.   BRIEF CLINICAL NOTE:  [name] is a 60 year old male who has had a several  month history of progressive worsening of left hip pain and mechanical  symptoms.  Exam and history are consistent with a labral tear.  He has  failed nonoperative management and presents now for hip arthroscopy.   PROCEDURE IN DETAIL:  After successful administration of general anesthetic,  the patient was placed in the right lateral decubitus position with the left  side up.  The left lower extremity is placed over the well-padded perineal  post and a foot placed into the well-padded foot holder.  The right lower  extremity is padded on the operating table.  Under fluoroscopic guidance,  traction is applied to the hip until it was adequately distracted.  He was  then prepped and draped in the usual sterile fashion.  The spinal needles  were then passed into the anterior and posterior peritrochanteric portal  sites.  Saline was then injected into the posterior needle and outflow was  coming through the anterior needle confirming that both are intra-articular.  Nitinol wire was passed posteriorly and the needle removed and the posterior  portal established.   The camera was then passed through the cannula into the  joint and arthroscopic visualization proceeds.   The fovea showed some hypertrophy of the synovium there but no loose bodies.  Femoral head looks good throughout the entire extent that we visualized.  The posterior half of the joint superior quadrant also looked fine.  Anteriorly, there was a tear of the anterior-inferior labrum with an  associated chondral defect.  We established the anterior portal and through  the anterior portal  debrided the labral tear back to stable base with the  4.2 mm shaver and sealed it with the ArthroCare device.  Also used a shaver  to debride the unstable cartilage flap on the anterior inferior acetabulum.  There is a small area about 1 x 1.5 cm.  We debrided back to stable bony  base with stable cartilaginous edges and abraded the bone.  The rest of the  cartilage appears stable on the acetabulum.  Once completed, then the  arthroscopic equipment is removed from the anterior portal, and then 20 cc  of 0.25% Marcaine  with  epi are injected through the inflow cannula.  That is subsequently  removed and traction released off the joint.  Fluoro confirms concentric  reduction.  The portals were closed with interrupted 4-0 nylon.  Bulky  sterile dressing is applied.  He is awakened and transported to recovery in  stable condition.      Ollen Gross, M.D.  Electronically Signed     FA/MEDQ  D:  08/12/2006  T:  08/12/2006  Job:  [id number]

## 2011-02-05 NOTE — H&P (Signed)
[facility]  Patient:    Bradley Morgan, Bradley Morgan Visit Number: [id number] MRN: [id number]          Service Type: Attending:  Ollen Gross, M.D. Dictated by:   Sammuel Cooper. Mahar, P.A. Adm. Date:  10/09/01                           History and Physical  DATE OF BIRTH:  [birth year]  CHIEF COMPLAINT:  Left knee pain.  HISTORY OF PRESENT ILLNESS:  The patient is a 60 year old male with a long history of left knee pain and disability.  It has been increasing to the point that the pain is almost constant in nature at this point.  He does have pain at night and at rest.  It has gotten to the point that it is interfering with his activities of daily living and quality of life.  He has failed conservative management.  The risks and benefits of the proposed surgery were discussed with the patient by Dr. Ollen Gross.  He indicated understanding and wished to proceed.  ALLERGIES:  The patient cannot take ASPIRIN and NSAIDs secondary to a history of an upper GI bleed.  MORPHINE causes nausea and vomiting.  MEDICATIONS: 1. Protonix 40 mg q.d. 2. Vicodin p.r.n.  PAST MEDICAL HISTORY:  Upper GI bleed in August 2002.  PAST SURGICAL HISTORY: 1. Lumbar surgery x 5.  Details are unknown. 2. Left knee scope in May 2002 and August 2002.  SOCIAL HISTORY:  Denies tobacco use, denies alcohol use.  He is married and has four grown children.  He lives in a [place] house with four steps to get in.  His wife will be available to help postoperatively, and he would like home health physical therapy if possible.  FAMILY HISTORY:  Mother is alive at age 66, with arthritis and hypertension. Father is alive at age 19, with diabetes, coronary artery disease, and history of prostate cancer.  REVIEW OF SYSTEMS:  The patient denies any fevers, chills, night sweats, or bleeding tendencies.  CNS:  Denies any blurred vision, double vision, headaches, seizure, or paralysis.   CARDIOVASCULAR:  Denies any chest pain, angina, orthopnea, claudication, or palpitations.  PULMONARY:  Denies any shortness of breath, productive cough, or hemoptysis.  GASTROINTESTINAL: Denies any nausea, vomiting, constipation, diarrhea, melena, or bloody stool. GENITOURINARY:  Denies any dysuria, hematuria, or discharge.  MUSCULOSKELETAL: Denies any paresthesias, numbness, or paralysis.  PHYSICAL EXAMINATION:  VITAL SIGNS:  Blood pressure 130/88, respirations 16 and unlabored, pulse 88 and regular.  GENERAL:  A 60 year old male who is alert and oriented, in no acute distress. Well-nourished, well-groomed, pleasant, cooperative to examination.  HEENT:  Head normocephalic, atraumatic.  Pupils are equal, round, and reactive to light.  Extraocular movements intact.  Nares patent bilaterally.  Pharynx is clear, without any erythema or exudate.  NECK:  Soft and supple to palpation.  No bruits appreciated.  No thyromegaly noted.  No lymphadenopathy noted.  CHEST:  Clear to auscultation bilaterally.  No rales, rhonchi, stridor, wheezes, or friction rub.  BREASTS:  Not pertinent, not performed.  HEART:  Regular S1, S2.  Regular rate and rhythm.  No murmurs, gallops, or rubs noted.  ABDOMEN:  Positive bowel sounds throughout.  Soft and supple to palpation. Nontender, nondistended.  No organomegaly noted.  GENITOURINARY:  Not pertinent, not performed.  EXTREMITIES:  Slight effusion.  Range of motion is 5-105 degrees.  No instability.  Diffuse soft-tissue tenderness.  Slight swelling in the calf. Negative Homans.  No popliteal tenderness to palpation.  SKIN:  Intact.  Without any lesions or rashes.  LABORATORY DATA:  X-ray:  Bone on bone left knee.  Valgus deformity.  IMPRESSION: 1. Left knee severe osteoarthritis. 2. History of upper gastrointestinal bleed in August 2002.  PLAN:  Admit to [facility] on October 09, 2001, for left total knee arthroplasty by Dr. Ollen Gross.  The patient will be on the Arixtra study.  PRIMARY CARE PHYSICIAN:  The patients primary care doctor is Dr. Dara Hoyer of [place]. Dictated by:   Sammuel Cooper. Mahar, P.A. Attending:  Ollen Gross, M.D. DD:  10/09/01 TD:  10/09/01 Job: [id number]

## 2011-05-27 ENCOUNTER — Other Ambulatory Visit: Payer: Self-pay | Admitting: Neurosurgery

## 2011-05-27 DIAGNOSIS — M47816 Spondylosis without myelopathy or radiculopathy, lumbar region: Secondary | ICD-10-CM

## 2011-06-02 ENCOUNTER — Ambulatory Visit
Admission: RE | Admit: 2011-06-02 | Discharge: 2011-06-02 | Disposition: A | Discharge status: Home / Self Care | Payer: Medicare Other | Source: Ambulatory Visit | Attending: Neurosurgery | Admitting: Neurosurgery

## 2011-06-02 DIAGNOSIS — M47816 Spondylosis without myelopathy or radiculopathy, lumbar region: Secondary | ICD-10-CM

## 2011-09-05 ENCOUNTER — Emergency Department (HOSPITAL_COMMUNITY): Payer: Medicare Other

## 2011-09-05 ENCOUNTER — Emergency Department (HOSPITAL_COMMUNITY)
Admission: EM | Admit: 2011-09-05 | Discharge: 2011-09-05 | Disposition: A | Discharge status: Home / Self Care | Payer: Medicare Other | Attending: Emergency Medicine | Admitting: Emergency Medicine

## 2011-09-05 ENCOUNTER — Encounter: Payer: Self-pay | Admitting: *Deleted

## 2011-09-05 DIAGNOSIS — M79609 Pain in unspecified limb: Secondary | ICD-10-CM | POA: Insufficient documentation

## 2011-09-05 DIAGNOSIS — S63279A Dislocation of unspecified interphalangeal joint of unspecified finger, initial encounter: Secondary | ICD-10-CM | POA: Insufficient documentation

## 2011-09-05 DIAGNOSIS — IMO0001 Reserved for inherently not codable concepts without codable children: Secondary | ICD-10-CM

## 2011-09-05 DIAGNOSIS — IMO0002 Reserved for concepts with insufficient information to code with codable children: Secondary | ICD-10-CM | POA: Insufficient documentation

## 2011-09-05 DIAGNOSIS — S63289A Dislocation of proximal interphalangeal joint of unspecified finger, initial encounter: Secondary | ICD-10-CM

## 2011-09-05 DIAGNOSIS — W010XXA Fall on same level from slipping, tripping and stumbling without subsequent striking against object, initial encounter: Secondary | ICD-10-CM | POA: Insufficient documentation

## 2011-09-05 MED ORDER — OXYCODONE-ACETAMINOPHEN 5-325 MG PO TABS
1.0000 | ORAL_TABLET | Freq: Once | ORAL | Status: AC
  Administered 2011-09-05: 1 via ORAL
  Filled 2011-09-05: qty 1

## 2011-09-05 MED ORDER — OXYCODONE-ACETAMINOPHEN 5-325 MG PO TABS: 1.0000 | ORAL_TABLET | Freq: Once | ORAL | Status: AC

## 2011-09-05 MED ORDER — LIDOCAINE HCL 2 % IJ SOLN
INTRAMUSCULAR | Status: AC
  Administered 2011-09-05: 22:00:00
  Filled 2011-09-05: qty 1

## 2011-09-05 NOTE — ED Notes (Signed)
Pharmacy called and I changed Oxycodone Rx to read 1 pill q 4 hrs PRN Pain per MD Mahoney v.o.

## 2011-09-05 NOTE — ED Notes (Signed)
Patient reports he tripped over a drop cord and he hurt his middle finger on his right hand.  Denies any other injury.  The finger does appear deformed

## 2011-09-05 NOTE — ED Provider Notes (Signed)
History     CSN: [id number] Arrival date & time: 09/05/2011  7:12 PM   First MD Initiated Contact with Patient 09/05/11 2137      Chief Complaint  Patient presents with  . Hand Pain    (Consider location/radiation/quality/duration/timing/severity/associated sxs/prior treatment) Patient is a 60 y.o. male presenting with hand injury. The history is provided by the patient.  Hand Injury  The incident occurred 3 to 5 hours ago. The incident occurred at home. The injury mechanism was a fall. The pain is present in the right fingers. The quality of the pain is described as sharp. The pain is at a severity of 7/10. The pain has been constant since the incident. Pertinent negatives include no fever. He reports no foreign bodies present. The symptoms are aggravated by movement and use. He has tried nothing for the symptoms. The treatment provided no relief.    History reviewed. No pertinent past medical history.  Past Surgical History  Procedure Date  . Neck surgery     History reviewed. No pertinent family history.  History  Substance Use Topics  . Smoking status: Former Games developer  . Smokeless tobacco: Not on file  . Alcohol Use: No      Review of Systems  Constitutional: Negative for fever and chills.  HENT: Negative for neck pain.   Respiratory: Negative for cough and shortness of breath.   Cardiovascular: Negative for chest pain and palpitations.  Gastrointestinal: Negative for nausea, vomiting and abdominal pain.  Musculoskeletal: Negative for back pain.  Skin: Negative for color change and rash.  Neurological: Negative for light-headedness and headaches.  All other systems reviewed and are negative.    Allergies  Review of patient's allergies indicates no known allergies.  Home Medications   Current Outpatient Rx  Name Route Sig Dispense Refill  . DIPHENHYDRAMINE HCL (SLEEP) 25 MG PO TABS Oral Take 25 mg by mouth at bedtime as needed. For itching       BP 155/87   Pulse 95  Temp(Src) 98.5 F (36.9 C) (Oral)  Resp 20  SpO2 95%  Physical Exam  Nursing note and vitals reviewed. Constitutional: He is oriented to person, place, and time. He appears well-developed and well-nourished.  HENT:  Head: Normocephalic and atraumatic.  Eyes: EOM are normal. Pupils are equal, round, and reactive to light.  Neck:       No midline tenderness  Cardiovascular: Normal rate and regular rhythm.   Pulmonary/Chest: Effort normal and breath sounds normal. No respiratory distress.  Abdominal: Soft. There is no tenderness.  Musculoskeletal:       Hands: Neurological: He is alert and oriented to person, place, and time.  Skin: Skin is warm and dry.  Psychiatric: He has a normal mood and affect.    ED Course  Reduction of dislocation Date/Time: 09/05/2011 9:50 PM Performed by: Theotis Burrow Authorized by: Forbes Cellar Consent: Verbal consent obtained. Risks and benefits: risks, benefits and alternatives were discussed Consent given by: patient Patient understanding: patient states understanding of the procedure being performed Patient consent: the patient's understanding of the procedure matches consent given Procedure consent: procedure consent matches procedure scheduled Relevant documents: relevant documents present and verified Test results: test results available and properly labeled Site marked: the operative site was marked Imaging studies: imaging studies available Patient identity confirmed: verbally with patient Time out: Immediately prior to procedure a "time out" was called to verify the correct patient, procedure, equipment, support staff and site/side marked as required. Local anesthesia used:  yes Anesthesia: local infiltration Local anesthetic: lidocaine 2% without epinephrine Anesthetic total: 1 ml Patient sedated: no Patient tolerance: Patient tolerated the procedure well with no immediate complications.   (including  critical care time)  Labs Reviewed - No data to display Dg Finger Middle Right  09/05/2011  *RADIOLOGY REPORT*  Clinical Data: Post reduction  RIGHT MIDDLE FINGER 2+V  Comparison: 09/05/2011 at 1921 hours  Findings: Interval reduction of the distal third digit at the PIP joint.  Associated volar plate avulsion fracture on the lateral view.  Mild soft tissue swelling.  IMPRESSION: Interval reduction of the distal third digit at the PIP joint.  Associated volar plate avulsion fracture on the lateral view.  Original Report Authenticated By: Charline Bills, M.D.   Dg Finger Middle Right  09/05/2011  *RADIOLOGY REPORT*  Clinical Data: Fall, pain, right third digit deformity  RIGHT MIDDLE FINGER 2+V  Comparison: None.  Findings: Subluxation/dislocation of the distal third digit at the PIP joint.  No fracture is seen.  Mild soft tissue swelling.  IMPRESSION: Subluxation/dislocation of the distal third digit at the PIP joint.  No fracture is seen.  Original Report Authenticated By: Charline Bills, M.D.     1. Dislocation of PIP joint of finger   2. Closed fracture of middle phalanx of third finger of right hand       MDM  60 yo M who suffered a mechanical fall (tripped over a cord), landing on his R hand, with subsequent deformity and pain. Denies any other injuries. Exam remarkable for deformity noted of PIP of R middle finger; confirmed dislocation on X-ray without underlying fracture. Reduction performed as above; afterwards, pt still with significant pain with movement and limited active ROM due to pain, but full passive ROM. Post reduction X-ray shows avulsion fracture. Finger splinted, and pt to follow up with hand surgery. Discussed treatment at home, indications for return, and pt expresses understanding.        Theotis Burrow, MD 09/05/11 2329

## 2011-09-05 NOTE — ED Notes (Signed)
Ice pack provided for hand by [employer]

## 2011-09-05 NOTE — ED Notes (Signed)
Pt reports tripping over a drop cord and falling, landing on his (L) hand.  States that he hurt his middle finger.  Middle finger is noted to have a deformity.  Skin warm, dry and intact.  Neuro intact.

## 2011-09-06 NOTE — ED Provider Notes (Signed)
I saw and evaluated the patient, reviewed the resident's note and I agree with the findings and plan, supervised dislocation reduction.    Neurovasc intact. Small fracture after reduction. Will f/u with hand surgery.  Forbes Cellar, MD 09/06/11 2255

## 2011-09-17 ENCOUNTER — Other Ambulatory Visit: Payer: Self-pay | Admitting: Orthopedic Surgery

## 2011-09-17 ENCOUNTER — Ambulatory Visit
Admission: RE | Admit: 2011-09-17 | Discharge: 2011-09-17 | Disposition: A | Discharge status: Home / Self Care | Payer: Medicare Other | Source: Ambulatory Visit | Attending: Orthopedic Surgery | Admitting: Orthopedic Surgery

## 2011-09-17 DIAGNOSIS — R52 Pain, unspecified: Secondary | ICD-10-CM

## 2011-09-17 DIAGNOSIS — IMO0002 Reserved for concepts with insufficient information to code with codable children: Secondary | ICD-10-CM

## 2012-12-12 ENCOUNTER — Other Ambulatory Visit: Payer: Self-pay | Admitting: Family Medicine

## 2012-12-12 ENCOUNTER — Ambulatory Visit
Admission: RE | Admit: 2012-12-12 | Discharge: 2012-12-12 | Disposition: A | Discharge status: Home / Self Care | Payer: Medicare Other | Source: Ambulatory Visit | Attending: Family Medicine | Admitting: Family Medicine

## 2012-12-12 DIAGNOSIS — S6980XA Other specified injuries of unspecified wrist, hand and finger(s), initial encounter: Secondary | ICD-10-CM

## 2013-12-16 ENCOUNTER — Emergency Department (HOSPITAL_COMMUNITY): Payer: Medicare Other

## 2013-12-16 ENCOUNTER — Encounter (HOSPITAL_COMMUNITY): Payer: Self-pay | Admitting: Emergency Medicine

## 2013-12-16 ENCOUNTER — Observation Stay (HOSPITAL_COMMUNITY)
Admission: EM | Admit: 2013-12-16 | Discharge: 2013-12-17 | Disposition: A | Discharge status: Home / Self Care | Payer: Medicare Other | Attending: Internal Medicine | Admitting: Internal Medicine

## 2013-12-16 DIAGNOSIS — Z8249 Family history of ischemic heart disease and other diseases of the circulatory system: Secondary | ICD-10-CM

## 2013-12-16 DIAGNOSIS — L74519 Primary focal hyperhidrosis, unspecified: Secondary | ICD-10-CM | POA: Insufficient documentation

## 2013-12-16 DIAGNOSIS — Z8719 Personal history of other diseases of the digestive system: Secondary | ICD-10-CM | POA: Insufficient documentation

## 2013-12-16 DIAGNOSIS — R110 Nausea: Secondary | ICD-10-CM | POA: Insufficient documentation

## 2013-12-16 DIAGNOSIS — E669 Obesity, unspecified: Secondary | ICD-10-CM

## 2013-12-16 DIAGNOSIS — E7800 Pure hypercholesterolemia, unspecified: Secondary | ICD-10-CM

## 2013-12-16 DIAGNOSIS — Z87891 Personal history of nicotine dependence: Secondary | ICD-10-CM | POA: Insufficient documentation

## 2013-12-16 DIAGNOSIS — R0789 Other chest pain: Principal | ICD-10-CM | POA: Insufficient documentation

## 2013-12-16 DIAGNOSIS — I10 Essential (primary) hypertension: Secondary | ICD-10-CM

## 2013-12-16 DIAGNOSIS — Z886 Allergy status to analgesic agent status: Secondary | ICD-10-CM | POA: Insufficient documentation

## 2013-12-16 DIAGNOSIS — R079 Chest pain, unspecified: Secondary | ICD-10-CM

## 2013-12-16 DIAGNOSIS — R0602 Shortness of breath: Secondary | ICD-10-CM | POA: Insufficient documentation

## 2013-12-16 DIAGNOSIS — E785 Hyperlipidemia, unspecified: Secondary | ICD-10-CM

## 2013-12-16 DIAGNOSIS — G8929 Other chronic pain: Secondary | ICD-10-CM | POA: Insufficient documentation

## 2013-12-16 HISTORY — DX: Essential (primary) hypertension: I10

## 2013-12-16 LAB — CBC
HCT: 40.9 % (ref 39.0–52.0)
HCT: 41.3 % (ref 39.0–52.0)
HEMOGLOBIN: 13.4 g/dL (ref 13.0–17.0)
HEMOGLOBIN: 13.7 g/dL (ref 13.0–17.0)
MCH: 30.8 pg (ref 26.0–34.0)
MCH: 30.9 pg (ref 26.0–34.0)
MCHC: 32.8 g/dL (ref 30.0–36.0)
MCHC: 33.2 g/dL (ref 30.0–36.0)
MCV: 94 fL (ref 78.0–100.0)
MCV: 93.2 fL (ref 78.0–100.0)
Platelets: 177 10*3/uL (ref 150–400)
Platelets: 182 10*3/uL (ref 150–400)
RBC: 4.35 MIL/uL (ref 4.22–5.81)
RBC: 4.43 MIL/uL (ref 4.22–5.81)
RDW: 13.8 % (ref 11.5–15.5)
RDW: 13.7 % (ref 11.5–15.5)
WBC: 6.3 10*3/uL (ref 4.0–10.5)
WBC: 6 10*3/uL (ref 4.0–10.5)

## 2013-12-16 LAB — BASIC METABOLIC PANEL
BUN: 17 mg/dL (ref 6–23)
CALCIUM: 9.4 mg/dL (ref 8.4–10.5)
CO2: 25 meq/L (ref 19–32)
CREATININE: 1.22 mg/dL (ref 0.50–1.35)
Chloride: 101 mEq/L (ref 96–112)
GFR calc Af Amer: 73 mL/min — ABNORMAL LOW (ref >90)
GFR calc non Af Amer: 63 mL/min — ABNORMAL LOW (ref >90)
GLUCOSE: 78 mg/dL (ref 70–99)
Potassium: 5 mEq/L (ref 3.7–5.3)
Sodium: 141 mEq/L (ref 137–147)

## 2013-12-16 LAB — LIPID PANEL
CHOLESTEROL: 194 mg/dL (ref 0–200)
HDL: 39 mg/dL — ABNORMAL LOW (ref >39)
LDL Cholesterol: 122 mg/dL — ABNORMAL HIGH (ref 0–99)
Total CHOL/HDL Ratio: 5 RATIO
Triglycerides: 164 mg/dL — ABNORMAL HIGH (ref <150)
VLDL: 33 mg/dL (ref 0–40)

## 2013-12-16 LAB — TROPONIN I: Troponin I: <0.3 ng/mL (ref <0.30)

## 2013-12-16 LAB — PRO B NATRIURETIC PEPTIDE: Pro B Natriuretic peptide (BNP): 109.5 pg/mL (ref 0–125)

## 2013-12-16 LAB — I-STAT TROPONIN, ED: Troponin i, poc: 0 ng/mL (ref 0.00–0.08)

## 2013-12-16 LAB — CREATININE, SERUM
CREATININE: 1.15 mg/dL (ref 0.50–1.35)
GFR, EST AFRICAN AMERICAN: 78 mL/min — AB (ref >90)
GFR, EST NON AFRICAN AMERICAN: 67 mL/min — AB (ref >90)

## 2013-12-16 MED ORDER — NITROGLYCERIN 2 % TD OINT
0.5000 [in_us] | TOPICAL_OINTMENT | Freq: Once | TRANSDERMAL | Status: AC
  Administered 2013-12-16: 0.5 [in_us] via TOPICAL
  Filled 2013-12-16: qty 1

## 2013-12-16 MED ORDER — MORPHINE SULFATE 2 MG/ML IJ SOLN: 0.5000 mg | INTRAMUSCULAR | Status: DC | PRN

## 2013-12-16 MED ORDER — ATORVASTATIN CALCIUM 40 MG PO TABS
40.0000 mg | ORAL_TABLET | Freq: Every day | ORAL | Status: DC
  Administered 2013-12-16 – 2013-12-17 (×2): 40 mg via ORAL
  Filled 2013-12-16 (×2): qty 1

## 2013-12-16 MED ORDER — GABAPENTIN 300 MG PO CAPS
300.0000 mg | ORAL_CAPSULE | Freq: Three times a day (TID) | ORAL | Status: DC
  Administered 2013-12-16 – 2013-12-17 (×2): 300 mg via ORAL
  Filled 2013-12-16 (×4): qty 1

## 2013-12-16 MED ORDER — ASPIRIN EC 325 MG PO TBEC
325.0000 mg | DELAYED_RELEASE_TABLET | Freq: Every day | ORAL | Status: DC
  Administered 2013-12-17: 325 mg via ORAL

## 2013-12-16 MED ORDER — CLONAZEPAM 0.5 MG PO TABS
0.5000 mg | ORAL_TABLET | Freq: Two times a day (BID) | ORAL | Status: DC | PRN
  Administered 2013-12-16: 0.5 mg via ORAL
  Filled 2013-12-16: qty 1

## 2013-12-16 MED ORDER — LISINOPRIL 10 MG PO TABS
10.0000 mg | ORAL_TABLET | Freq: Every day | ORAL | Status: DC
  Filled 2013-12-16 (×2): qty 1

## 2013-12-16 MED ORDER — SODIUM CHLORIDE 0.9 % IJ SOLN
3.0000 mL | Freq: Two times a day (BID) | INTRAMUSCULAR | Status: DC
  Administered 2013-12-17: 3 mL via INTRAVENOUS

## 2013-12-16 MED ORDER — NAPHAZOLINE HCL 0.1 % OP SOLN: 1.0000 [drp] | Freq: Four times a day (QID) | OPHTHALMIC | Status: DC | PRN

## 2013-12-16 MED ORDER — ROPINIROLE HCL 0.5 MG PO TABS
0.5000 mg | ORAL_TABLET | Freq: Two times a day (BID) | ORAL | Status: DC
Start: 2013-12-16 — End: 2013-12-17
  Administered 2013-12-16 – 2013-12-17 (×2): 0.5 mg via ORAL
  Filled 2013-12-16 (×3): qty 1

## 2013-12-16 MED ORDER — PANTOPRAZOLE SODIUM 40 MG PO TBEC
40.0000 mg | DELAYED_RELEASE_TABLET | Freq: Every day | ORAL | Status: DC
  Administered 2013-12-17: 40 mg via ORAL

## 2013-12-16 MED ORDER — TRAMADOL HCL 50 MG PO TABS
50.0000 mg | ORAL_TABLET | Freq: Four times a day (QID) | ORAL | Status: DC | PRN
  Administered 2013-12-16 – 2013-12-17 (×2): 50 mg via ORAL
  Filled 2013-12-16 (×3): qty 1

## 2013-12-16 MED ORDER — NITROGLYCERIN 0.4 MG/SPRAY TL SOLN
1.0000 | Status: DC | PRN
  Filled 2013-12-16: qty 4.9

## 2013-12-16 MED ORDER — HEPARIN SODIUM (PORCINE) 5000 UNIT/ML IJ SOLN
5000.0000 [IU] | Freq: Three times a day (TID) | INTRAMUSCULAR | Status: DC
  Administered 2013-12-16 – 2013-12-17 (×2): 5000 [IU] via SUBCUTANEOUS
  Filled 2013-12-16 (×5): qty 1

## 2013-12-16 NOTE — H&P (Signed)
Hospitalist Admission History and Physical  Patient name: Bradley Morgan Medical record number: [id number] Date of birth: [birth year] Age: 63 y.o. Gender: male  Primary Care Provider: Woody Seller, MD  Chief Complaint: chest pain  History of Present Illness:This is a 63 y.o. year old male with significant past medical history of hypertension hyperlipidemia presenting with chest pain. Patient was at [facility] earlier today he number when he noticed sudden onset of central chest pressure without radiation it lasted minutes. Also had some nausea and diaphoresis associated with chest pressure. Wife states that she took him immediately to a nearby urgent care for further evaluation. Patient was given sublingual much worsened as well as aspirin with resolution of chest pressure. Patient was then brought to the ER via EMS. Patient denies any prior history of heart attack. Positive family history of heart disease in father. Former smoker quit greater than 13 years ago. Denies any prior history of esophageal reflux. On presentation to the ER, patient is hemodynamically stable. Troponins and EKG within normal limits. Asked to admit for a ACS rule out. No active CP currently.   CV RFs: age, gender, HTN, HLD, family history   Patient Active Problem List   Diagnosis Date Noted  . Chest pain 12/16/2013   Past Medical History: Past Medical History  Diagnosis Date  . Hypertension   . Hyperlipemia   . Chronic pain     neck;lower back  . Multiple gastric ulcers     Past Surgical History: Past Surgical History  Procedure Laterality Date  . Neck surgery    . Total knee arthroplasty Left   . Total hip arthroplasty Left 2002  . Joint replacement      Social History: History   Social History  . Marital Status: Married    Spouse Name: N/A    Number of Children: N/A  . Years of Education: N/A   Social History Main Topics  . Smoking status: Former Smoker    Types: Cigarettes    Quit date:  10/09/2013  . Smokeless tobacco: None  . Alcohol Use: No  . Drug Use: No  . Sexual Activity: None   Other Topics Concern  . None   Social History Narrative  . None    Family History: Family History  Problem Relation Age of Onset  . Heart failure Father   . Diabetes Father   . Hypertension Father     Allergies: Allergies  Allergen Reactions  . Aspirin     No high doses    Current Facility-Administered Medications  Medication Dose Route Frequency Provider Last Rate Last Dose  . aspirin EC tablet 325 mg  325 mg Oral Daily Shanda Howells, MD      . heparin injection 5,000 Units  5,000 Units Subcutaneous 3 times per day Shanda Howells, MD      . morphine 2 MG/ML injection 0.5-1 mg  0.5-1 mg Intravenous Q2H PRN Shanda Howells, MD      . sodium chloride 0.9 % injection 3 mL  3 mL Intravenous Q12H Shanda Howells, MD       Current Outpatient Prescriptions  Medication Sig Dispense Refill  . clonazePAM (KLONOPIN) 0.5 MG tablet Take 0.5 mg by mouth 2 (two) times daily as needed for anxiety.      . dimenhyDRINATE (DRAMAMINE) 50 MG tablet Take 50 mg by mouth every 8 (eight) hours as needed for nausea or dizziness.      . gabapentin (NEURONTIN) 300 MG capsule Take 300 mg by mouth  3 (three) times daily.      Marland Kitchen ketorolac (TORADOL) 10 MG tablet Take 10 mg by mouth every 6 (six) hours as needed for moderate pain.      Marland Kitchen lisinopril (PRINIVIL,ZESTRIL) 10 MG tablet Take 10 mg by mouth daily.      Marland Kitchen rOPINIRole (REQUIP) 0.5 MG tablet Take 0.5 mg by mouth 2 (two) times daily.      . rosuvastatin (CRESTOR) 20 MG tablet Take 20 mg by mouth daily.      Marland Kitchen tetrahydrozoline 0.05 % ophthalmic solution Place 1 drop into both eyes 3 (three) times daily as needed (dry eyes).      Marland Kitchen aspirin 325 MG tablet Take 325 mg by mouth daily.      . nitroGLYCERIN (NITROSTAT) 0.4 MG SL tablet Place 0.4 mg under the tongue every 5 (five) minutes as needed for chest pain.       Review Of Systems: 12 point ROS negative  except as noted above in HPI.  Physical Exam: Filed Vitals:   12/16/13 1830  BP: 135/78  Pulse: 74  Temp:   Resp: 12    General: alert and cooperative HEENT: PERRLA and extra ocular movement intact Heart: S1, S2 normal, no murmur, rub or gallop, regular rate and rhythm Lungs: clear to auscultation, no wheezes or rales and unlabored breathing Abdomen: abdomen is soft without significant tenderness, masses, organomegaly or guarding Extremities: extremities normal, atraumatic, no cyanosis or edema Skin:no rashes, no ecchymoses Neurology: normal without focal findings  Labs and Imaging: Lab Results  Component Value Date/Time   NA 141 12/16/2013  5:12 PM   K 5.0 12/16/2013  5:12 PM   CL 101 12/16/2013  5:12 PM   CO2 25 12/16/2013  5:12 PM   BUN 17 12/16/2013  5:12 PM   CREATININE 1.22 12/16/2013  5:12 PM   GLUCOSE 78 12/16/2013  5:12 PM   Lab Results  Component Value Date   WBC 6.3 12/16/2013   HGB 13.4 12/16/2013   HCT 40.9 12/16/2013   MCV 94.0 12/16/2013   PLT 177 12/16/2013   EKG: NSR   Dg Chest 2 View  12/16/2013   CLINICAL DATA:  Left-sided chest pain.  Short of breath.  EXAM: CHEST  2 VIEW  COMPARISON:  05/21/2009  FINDINGS: Cardiac silhouette is normal in size. Normal mediastinal and hilar contours.  Clear lungs.  No pleural effusion or pneumothorax.  Changes from a cervical spine fusion as well as right rotator cuff surgery. Bony thorax is demineralized.  IMPRESSION: No acute cardiopulmonary disease.   Electronically Signed   By: Lajean Manes M.D.   On: 12/16/2013 18:13     Assessment and Plan: [name] is a 63 y.o. year old male presenting with chest pain   Chest Pain: fairly typical sxs in pt with mutliple CV RFs. DDx also includes esophageal spasm as CP was assd with eating food and relieved by NTG. Will cycle CEs. Risk stratification labs including A1C, TSH, lipid panel. Cards consult. Start on PPI.    HTN: hemodynamically stable. Continue home meds .  HLD:  check lipid panel. Cont statin FEN/GI: heart healthy diet. PPI Prophylaxis: sub q heparin  Disposition: pending further evaluation  Code Status:Full Code.        Shanda Howells MD  Pager: [phone]

## 2013-12-16 NOTE — ED Provider Notes (Signed)
CSN: [id number]     Arrival date & time 12/16/13  1643 History   First MD Initiated Contact with Patient 12/16/13 1649     Chief Complaint  Patient presents with  . Shortness of Breath  . Chest Pain     (Consider location/radiation/quality/duration/timing/severity/associated sxs/prior Treatment) Patient is a 63 y.o. male presenting with chest pain. The history is provided by the patient.  Chest Pain Pain location:  L chest Pain quality: dull and pressure   Pain radiates to:  Does not radiate Pain radiates to the back: no   Pain severity:  Mild Onset quality:  Sudden Duration:  2 hours Timing:  Constant Progression:  Resolved Chronicity:  New Context: at rest   Context: not breathing, not raising an arm and no trauma   Relieved by:  Nitroglycerin Worsened by:  Nothing tried Ineffective treatments:  None tried Associated symptoms: diaphoresis, nausea and shortness of breath   Associated symptoms: no abdominal pain, no altered mental status, no cough, no dizziness, no fever, no lower extremity edema, no near-syncope, no numbness, no syncope, not vomiting and no weakness   Risk factors: high cholesterol, hypertension, male sex and smoking   Risk factors: no aortic disease, no coronary artery disease, no diabetes mellitus, not obese, no prior DVT/PE and no surgery     Past Medical History  Diagnosis Date  . Hypertension   . Hyperlipemia   . Chronic pain     neck;lower back  . Multiple gastric ulcers    Past Surgical History  Procedure Laterality Date  . Neck surgery    . Total knee arthroplasty Left   . Total hip arthroplasty Left 2002  . Joint replacement     Family History  Problem Relation Age of Onset  . Heart failure Father   . Diabetes Father   . Hypertension Father    History  Substance Use Topics  . Smoking status: Former Smoker    Types: Cigarettes    Quit date: 10/09/2013  . Smokeless tobacco: Not on file  . Alcohol Use: No    Review of Systems   Constitutional: Positive for diaphoresis. Negative for fever, activity change and appetite change.  HENT: Negative for congestion and rhinorrhea.   Eyes: Negative for discharge and itching.  Respiratory: Positive for shortness of breath. Negative for cough and wheezing.   Cardiovascular: Positive for chest pain. Negative for syncope and near-syncope.  Gastrointestinal: Positive for nausea. Negative for vomiting, abdominal pain, diarrhea and constipation.  Genitourinary: Negative for hematuria, decreased urine volume and difficulty urinating.  Skin: Negative for rash and wound.  Neurological: Negative for dizziness, syncope, weakness and numbness.  All other systems reviewed and are negative.      Allergies  Aspirin  Home Medications   No current outpatient prescriptions on file. BP 138/82  Pulse 77  Temp(Src) 98.6 F (37 C) (Oral)  Resp 18  Ht 6\' 1"  (1.854 m)  Wt 231 lb 9.6 oz (105.053 kg)  BMI 30.56 kg/m2  SpO2 98% Physical Exam  Vitals reviewed. Constitutional: He is oriented to person, place, and time. He appears well-developed and well-nourished. No distress.  HENT:  Head: Normocephalic and atraumatic.  Mouth/Throat: Oropharynx is clear and moist. No oropharyngeal exudate.  Eyes: Conjunctivae and EOM are normal. Pupils are equal, round, and reactive to light. Right eye exhibits no discharge. Left eye exhibits no discharge. No scleral icterus.  Neck: Normal range of motion. Neck supple.  Cardiovascular: Normal rate, regular rhythm, normal heart sounds and  intact distal pulses.  Exam reveals no gallop and no friction rub.   No murmur heard. Pulmonary/Chest: Effort normal and breath sounds normal. No respiratory distress. He has no wheezes. He has no rales.  Abdominal: Soft. He exhibits no distension and no mass. There is no tenderness.  Musculoskeletal: Normal range of motion.  Neurological: He is alert and oriented to person, place, and time. No cranial nerve deficit.  He exhibits normal muscle tone. Coordination normal.  Skin: Skin is warm. No rash noted. He is not diaphoretic.    ED Course  Procedures (including critical care time) Labs Review Labs Reviewed  BASIC METABOLIC PANEL - Abnormal; Notable for the following:    GFR calc non Af Amer 63 (*)    GFR calc Af Amer 73 (*)    All other components within normal limits  CREATININE, SERUM - Abnormal; Notable for the following:    GFR calc non Af Amer 67 (*)    GFR calc Af Amer 78 (*)    All other components within normal limits  LIPID PANEL - Abnormal; Notable for the following:    Triglycerides 164 (*)    HDL 39 (*)    LDL Cholesterol 122 (*)    All other components within normal limits  CBC  PRO B NATRIURETIC PEPTIDE  CBC  TROPONIN I  TROPONIN I  TROPONIN I  HEMOGLOBIN A1C  CBC WITH DIFFERENTIAL  COMPREHENSIVE METABOLIC PANEL  TSH  I-STAT TROPOININ, ED   Imaging Review Dg Chest 2 View  12/16/2013   CLINICAL DATA:  Left-sided chest pain.  Short of breath.  EXAM: CHEST  2 VIEW  COMPARISON:  05/21/2009  FINDINGS: Cardiac silhouette is normal in size. Normal mediastinal and hilar contours.  Clear lungs.  No pleural effusion or pneumothorax.  Changes from a cervical spine fusion as well as right rotator cuff surgery. Bony thorax is demineralized.  IMPRESSION: No acute cardiopulmonary disease.   Electronically Signed   By: Lajean Manes M.D.   On: 12/16/2013 18:13     EKG Interpretation None      MDM    MDM: 63 y.o. WM w/ PMHx of HTN, HLD, tobacco abuse w/ cc: of chest pain. Chest pain started at [employer], pressure, L chest non radiating w/ SOB, nausea, diaphoresis. Called EMS given ASA and NTG and resolved. AFVSS< well appearing, no pain in ER. EKG with no ischemic change. Trop negative. CXR negative. No hx of stress test or cath. D/w medicine who will admit for cardiac rule out. Medicine request I speak to cards who states that in the absence of ischemic change and negative trop,  thinks a stress test will be sufficient. Medicine to admit. Pt given NTG paste in ER with no recurrence of pain.   Final diagnoses:  None   Admit    Sol Passer, MD 12/17/13 0030

## 2013-12-16 NOTE — ED Notes (Signed)
Pt reports he wasn't "feeling well" this morning, states he went to [employer] and started to experience non-radiating left sided CP described as pressure. Pt reports SOB, nausea, and diaphoresis with pain. Went to [facility] given 1 nitro, 325 aspirin which relieved pain. Pt now denies pain but reports continued SOB. 100% RA. Denies cough.

## 2013-12-16 NOTE — ED Notes (Signed)
Per [place]: Pt from [facility] reports non-radiating left sided CP with SOB starting appx 2 hours of ago. Pt given 1 nitro, 324 aspirin; Pt now denies CP, but reports continued SOB. 98% RA. 86 NSR. 140/76. AO x4. NAD.

## 2013-12-17 ENCOUNTER — Other Ambulatory Visit: Payer: Self-pay

## 2013-12-17 ENCOUNTER — Observation Stay (HOSPITAL_COMMUNITY): Payer: Medicare Other

## 2013-12-17 DIAGNOSIS — I10 Essential (primary) hypertension: Secondary | ICD-10-CM | POA: Diagnosis present

## 2013-12-17 DIAGNOSIS — R079 Chest pain, unspecified: Secondary | ICD-10-CM

## 2013-12-17 DIAGNOSIS — Z8249 Family history of ischemic heart disease and other diseases of the circulatory system: Secondary | ICD-10-CM

## 2013-12-17 DIAGNOSIS — E669 Obesity, unspecified: Secondary | ICD-10-CM | POA: Diagnosis present

## 2013-12-17 DIAGNOSIS — E7800 Pure hypercholesterolemia, unspecified: Secondary | ICD-10-CM | POA: Diagnosis present

## 2013-12-17 HISTORY — PX: CARDIOVASCULAR STRESS TEST: SHX262

## 2013-12-17 LAB — COMPREHENSIVE METABOLIC PANEL
ALK PHOS: 72 U/L (ref 39–117)
ALT: 13 U/L (ref 0–53)
AST: 14 U/L (ref 0–37)
Albumin: 3.6 g/dL (ref 3.5–5.2)
BUN: 16 mg/dL (ref 6–23)
CALCIUM: 9.3 mg/dL (ref 8.4–10.5)
CO2: 28 meq/L (ref 19–32)
Chloride: 100 mEq/L (ref 96–112)
Creatinine, Ser: 1.36 mg/dL — ABNORMAL HIGH (ref 0.50–1.35)
GFR calc non Af Amer: 55 mL/min — ABNORMAL LOW (ref >90)
GFR, EST AFRICAN AMERICAN: 64 mL/min — AB (ref >90)
GLUCOSE: 98 mg/dL (ref 70–99)
POTASSIUM: 3.9 meq/L (ref 3.7–5.3)
SODIUM: 140 meq/L (ref 137–147)
Total Bilirubin: 0.2 mg/dL — ABNORMAL LOW (ref 0.3–1.2)
Total Protein: 6 g/dL (ref 6.0–8.3)

## 2013-12-17 LAB — CBC WITH DIFFERENTIAL/PLATELET
Basophils Absolute: 0 10*3/uL (ref 0.0–0.1)
Basophils Relative: 1 % (ref 0–1)
EOS PCT: 2 % (ref 0–5)
Eosinophils Absolute: 0.1 10*3/uL (ref 0.0–0.7)
HEMATOCRIT: 39.2 % (ref 39.0–52.0)
Hemoglobin: 12.9 g/dL — ABNORMAL LOW (ref 13.0–17.0)
LYMPHS ABS: 1.9 10*3/uL (ref 0.7–4.0)
LYMPHS PCT: 38 % (ref 12–46)
MCH: 31.1 pg (ref 26.0–34.0)
MCHC: 32.9 g/dL (ref 30.0–36.0)
MCV: 94.5 fL (ref 78.0–100.0)
Monocytes Absolute: 0.5 10*3/uL (ref 0.1–1.0)
Monocytes Relative: 10 % (ref 3–12)
Neutro Abs: 2.6 10*3/uL (ref 1.7–7.7)
Neutrophils Relative %: 50 % (ref 43–77)
PLATELETS: 163 10*3/uL (ref 150–400)
RBC: 4.15 MIL/uL — AB (ref 4.22–5.81)
RDW: 13.9 % (ref 11.5–15.5)
WBC: 5.1 10*3/uL (ref 4.0–10.5)

## 2013-12-17 LAB — HEMOGLOBIN A1C
Hgb A1c MFr Bld: 5.4 % (ref <5.7)
Mean Plasma Glucose: 108 mg/dL (ref <117)

## 2013-12-17 LAB — TSH: TSH: 1.332 u[IU]/mL (ref 0.350–4.500)

## 2013-12-17 LAB — TROPONIN I
Troponin I: <0.3 ng/mL (ref <0.30)
Troponin I: <0.3 ng/mL (ref <0.30)

## 2013-12-17 MED ORDER — TECHNETIUM TC 99M SESTAMIBI GENERIC - CARDIOLITE
10.0000 | Freq: Once | INTRAVENOUS | Status: AC | PRN
  Administered 2013-12-17: 10 via INTRAVENOUS

## 2013-12-17 MED ORDER — TECHNETIUM TC 99M SESTAMIBI GENERIC - CARDIOLITE
30.0000 | Freq: Once | INTRAVENOUS | Status: AC | PRN
  Administered 2013-12-17: 30 via INTRAVENOUS

## 2013-12-17 MED ORDER — REGADENOSON 0.4 MG/5ML IV SOLN
0.4000 mg | Freq: Once | INTRAVENOUS | Status: AC
  Administered 2013-12-17: 0.4 mg via INTRAVENOUS
  Filled 2013-12-17: qty 5

## 2013-12-17 MED ORDER — REGADENOSON 0.4 MG/5ML IV SOLN
INTRAVENOUS | Status: AC
  Administered 2013-12-17: 0.4 mg via INTRAVENOUS
  Filled 2013-12-17: qty 5

## 2013-12-17 NOTE — Discharge Instructions (Signed)
Chest Pain (Nonspecific) °It is often hard to give a specific diagnosis for the cause of chest pain. There is always a chance that your pain could be related to something serious, such as a heart attack or a blood clot in the lungs. You need to follow up with your caregiver for further evaluation. °CAUSES  °· Heartburn. °· Pneumonia or bronchitis. °· Anxiety or stress. °· Inflammation around your heart (pericarditis) or lung (pleuritis or pleurisy). °· A blood clot in the lung. °· A collapsed lung (pneumothorax). It can develop suddenly on its own (spontaneous pneumothorax) or from injury (trauma) to the chest. °· Shingles infection (herpes zoster virus). °The chest wall is composed of bones, muscles, and cartilage. Any of these can be the source of the pain. °· The bones can be bruised by injury. °· The muscles or cartilage can be strained by coughing or overwork. °· The cartilage can be affected by inflammation and become sore (costochondritis). °DIAGNOSIS  °Lab tests or other studies, such as X-rays, electrocardiography, stress testing, or cardiac imaging, may be needed to find the cause of your pain.  °TREATMENT  °· Treatment depends on what may be causing your chest pain. Treatment may include: °· Acid blockers for heartburn. °· Anti-inflammatory medicine. °· Pain medicine for inflammatory conditions. °· Antibiotics if an infection is present. °· You may be advised to change lifestyle habits. This includes stopping smoking and avoiding alcohol, caffeine, and chocolate. °· You may be advised to keep your head raised (elevated) when sleeping. This reduces the chance of acid going backward from your stomach into your esophagus. °· Most of the time, nonspecific chest pain will improve within 2 to 3 days with rest and mild pain medicine. °HOME CARE INSTRUCTIONS  °· If antibiotics were prescribed, take your antibiotics as directed. Finish them even if you start to feel better. °· For the next few days, avoid physical  activities that bring on chest pain. Continue physical activities as directed. °· Do not smoke. °· Avoid drinking alcohol. °· Only take over-the-counter or prescription medicine for pain, discomfort, or fever as directed by your caregiver. °· Follow your caregiver's suggestions for further testing if your chest pain does not go away. °· Keep any follow-up appointments you made. If you do not go to an appointment, you could develop lasting (chronic) problems with pain. If there is any problem keeping an appointment, you must call to reschedule. °SEEK MEDICAL CARE IF:  °· You think you are having problems from the medicine you are taking. Read your medicine instructions carefully. °· Your chest pain does not go away, even after treatment. °· You develop a rash with blisters on your chest. °SEEK IMMEDIATE MEDICAL CARE IF:  °· You have increased chest pain or pain that spreads to your arm, neck, jaw, back, or abdomen. °· You develop shortness of breath, an increasing cough, or you are coughing up blood. °· You have severe back or abdominal pain, feel nauseous, or vomit. °· You develop severe weakness, fainting, or chills. °· You have a fever. °THIS IS AN EMERGENCY. Do not wait to see if the pain will go away. Get medical help at once. Call your local emergency services (911 in [place] Do not drive yourself to the hospital. °MAKE SURE YOU:  °· Understand these instructions. °· Will watch your condition. °· Will get help right away if you are not doing well or get worse. °Document Released: 06/16/2005 Document Revised: 11/29/2011 Document Reviewed: 04/11/2008 °Exit[employer]® Patient Information ©2014 [employer]   [employer]. ° °

## 2013-12-17 NOTE — Plan of Care (Signed)
Problem: Food- and Nutrition-Related Knowledge Deficit (NB-1.1) Goal: Nutrition education Formal process to instruct or train a patient/client in a skill or to impart knowledge to help patients/clients voluntarily manage or modify food choices and eating behavior to maintain or improve health. Outcome: Completed/Met Date Met:  12/17/13  RD consulted for nutrition education regarding weight loss.  Body mass index is 30.56 kg/(m^2). Pt meets criteria for Obesity based on current BMI.  RD provided "Weight Loss Tips" handout from the Academy of [employer]. Emphasized the importance of serving sizes and provided examples of correct portions of common foods. Discussed importance of controlled and consistent intake throughout the day. Provided examples of ways to balance meals/snacks and encouraged intake of high-fiber, whole grain complex carbohydrates. Emphasized the importance of hydration with calorie-free beverages and limiting sugar-sweetened beverages. Encouraged pt to discuss physical activity options with physician. Teach back method used.  Expect good compliance.  Current diet order is heart healthy, patient is consuming approximately 100% of meals at this time. Labs and medications reviewed. No further nutrition interventions warranted at this time. RD contact information provided. If additional nutrition issues arise, please re-consult RD.  Terrace Arabia RD, LDN

## 2013-12-17 NOTE — Discharge Summary (Signed)
Physician Discharge Summary  Bradley Morgan YQI:[id number] DOB: [birth year] DOA: 12/16/2013  PCP: Woody Seller, MD  Admit date: 12/16/2013 Discharge date: 12/17/2013  Recommendations for Outpatient Follow-up:  Pt instructed to hold lisinopril until seen by PCP and his kidney function re-evaluated. To have CBC and BMP rechecked.  Discharge Diagnoses:  Active Problems:   Chest pain   Obesity, unspecified   Essential hypertension, benign   Pure hypercholesterolemia    Discharge Condition: medically stable for discharge home today   Diet recommendation: heart healthy  History of present illness:  Pt is 63 yo male with HLD, HTN, obesity, former smoker, family history of heart disease (father), presented to [facility] ED with main concern of sudden onset of central chest discomfort with no specific radiating symptoms, no specific aggravating or alleviating factors, lasted several minutes and spontaneously resolved, associated with nausea and sweating. He went to urgent care and has received nitroglycerin and aspirin which he explains helped.   Assessment/Plan:   Active Problems:  Chest pain  - in pt with multiple risk factors outlined above  - appreciate cardiology input and stress test recommendations. Stress test was negative for acute ischemia - troponins negative so far and no events on telemetry  Acute renal failure  - sudden increase in Cr overnight  - possibly due to lisinopril which pt instructed to hold for 1 week until he sees his PCP who will recheck BMP Obesity, unspecified  - discussed exercise regimen and and dietary recommendations provided  - nutritionist consulted  Essential hypertension, benign  - reasonable in patient control  Pure hypercholesterolemia  - continue statin   Code Status: Full  Family Communication: Pt at bedside   Consultants:  Cardiology  Procedures:  Stress test --> negative for ischemia Antibiotics:  None     Signed:  Leisa Lenz,  MD  [facility] 12/17/2013, 5:26 PM  Pager #: [phone]   Discharge Exam: Filed Vitals:   12/17/13 1336  BP: 141/71  Pulse: 84  Temp: 98.9 F (37.2 C)  Resp: 17   Filed Vitals:   12/17/13 1115 12/17/13 1116 12/17/13 1117 12/17/13 1336  BP:  120/70 120/71 141/71  Pulse: 83 83 78 84  Temp:    98.9 F (37.2 C)  TempSrc:    Oral  Resp:    17  Height:      Weight:      SpO2:    96%    General: Pt is alert, follows commands appropriately, not in acute distress Cardiovascular: Regular rate and rhythm, S1/S2 +, no murmurs, no rubs, no gallops Respiratory: Clear to auscultation bilaterally, no wheezing, no crackles, no rhonchi Abdominal: Soft, non tender, non distended, bowel sounds +, no guarding Extremities: no edema, no cyanosis, pulses palpable bilaterally DP and PT Neuro: Grossly nonfocal  Discharge Instructions  Discharge Orders   Future Orders Complete By Expires   Call MD for:  difficulty breathing, headache or visual disturbances  As directed    Call MD for:  persistant dizziness or light-headedness  As directed    Call MD for:  persistant nausea and vomiting  As directed    Call MD for:  severe uncontrolled pain  As directed    Diet - low sodium heart healthy  As directed    Increase activity slowly  As directed          Medication List         clonazePAM 0.5 MG tablet  Commonly known as:  KLONOPIN  Take 0.5  mg by mouth 2 (two) times daily as needed for anxiety.     dimenhyDRINATE 50 MG tablet  Commonly known as:  DRAMAMINE  Take 50 mg by mouth every 8 (eight) hours as needed for nausea or dizziness.     gabapentin 300 MG capsule  Commonly known as:  NEURONTIN  Take 300 mg by mouth 3 (three) times daily.     ketorolac 10 MG tablet  Commonly known as:  TORADOL  Take 10 mg by mouth every 6 (six) hours as needed for moderate pain.     nitroGLYCERIN 0.4 MG SL tablet  Commonly known as:  NITROSTAT  Place 0.4 mg under the tongue every 5  (five) minutes as needed for chest pain.     rOPINIRole 1 MG tablet  Commonly known as:  REQUIP  Take 1 mg by mouth 2 (two) times daily.     simvastatin 40 MG tablet  Commonly known as:  ZOCOR  Take 40 mg by mouth at bedtime.     tetrahydrozoline 0.05 % ophthalmic solution  Place 1 drop into both eyes 3 (three) times daily as needed (dry eyes).           Follow-up Information   Follow up with Woody Seller, MD. Schedule an appointment as soon as possible for a visit in 1 week.   Specialty:  Family Medicine   Contact information:   [address] [place] [address]        The results of significant diagnostics from this hospitalization (including imaging, microbiology, ancillary and laboratory) are listed below for reference.    Significant Diagnostic Studies: Dg Chest 2 View  12/16/2013   CLINICAL DATA:  Left-sided chest pain.  Short of breath.  EXAM: CHEST  2 VIEW  COMPARISON:  05/21/2009  FINDINGS: Cardiac silhouette is normal in size. Normal mediastinal and hilar contours.  Clear lungs.  No pleural effusion or pneumothorax.  Changes from a cervical spine fusion as well as right rotator cuff surgery. Bony thorax is demineralized.  IMPRESSION: No acute cardiopulmonary disease.   Electronically Signed   By: Lajean Manes M.D.   On: 12/16/2013 18:13   Nm Myocar Multi W/spect W/wall Motion / Ef  12/17/2013   CLINICAL DATA:  Chest pain  EXAM: MYOCARDIAL IMAGING WITH SPECT (REST AND PHARMACOLOGIC-STRESS)  GATED LEFT VENTRICULAR WALL MOTION STUDY  LEFT VENTRICULAR EJECTION FRACTION  TECHNIQUE: Standard myocardial SPECT imaging was performed after resting intravenous injection of 10 mCi Tc-46m sestamibi. Subsequently, intravenous infusion of Lexiscan was performed under the supervision of the Cardiology staff. At peak effect of the drug, 30 mCi Tc-73m sestamibi was injected intravenously and standard myocardial SPECT imaging was performed. Quantitative gated imaging was also  performed to evaluate left ventricular wall motion, and estimate left ventricular ejection fraction.  COMPARISON:  None.  FINDINGS: SPECT: Fixed defect in the inferoseptal region extending towards the apex is present. No stress-induced perfusion defect.  Wall motion:  Normal motion  Ejection fraction: 63%. End-diastolic volume 191 cc. End systolic volume 37 cc.  IMPRESSION: Fixed defect in the inferoseptal region but there is no stress-induced ischemia.   Electronically Signed   By: Maryclare Bean M.D.   On: 12/17/2013 16:29    Microbiology: No results found for this or any previous visit (from the past 240 hour(s)).   Labs: Basic Metabolic Panel:  Recent Labs Lab 12/16/13 1712 12/16/13 2034 12/17/13 0300  NA 141  --  140  K 5.0  --  3.9  CL 101  --  100  CO2 25  --  28  GLUCOSE 78  --  98  BUN 17  --  16  CREATININE 1.22 1.15 1.36*  CALCIUM 9.4  --  9.3   Liver Function Tests:  Recent Labs Lab 12/17/13 0300  AST 14  ALT 13  ALKPHOS 72  BILITOT 0.2*  PROT 6.0  ALBUMIN 3.6   No results found for this basename: LIPASE, AMYLASE,  in the last 168 hours No results found for this basename: AMMONIA,  in the last 168 hours CBC:  Recent Labs Lab 12/16/13 1712 12/16/13 2034 12/17/13 0300  WBC 6.3 6.0 5.1  NEUTROABS  --   --  2.6  HGB 13.4 13.7 12.9*  HCT 40.9 41.3 39.2  MCV 94.0 93.2 94.5  PLT 177 182 163   Cardiac Enzymes:  Recent Labs Lab 12/16/13 2034 12/17/13 0300 12/17/13 0817  TROPONINI <0.30 <0.30 <0.30   BNP: BNP (last 3 results)  Recent Labs  12/16/13 1658  PROBNP 109.5   CBG: No results found for this basename: GLUCAP,  in the last 168 hours  Time coordinating discharge: Over 30 minutes

## 2013-12-17 NOTE — Progress Notes (Addendum)
Per Dr. Marlou Porch - nuc is low risk and OK to DC from cardiac standpoint. He does not feel the patient needs cardiology f/u at this time. Tylerjames Hoglund PA-C

## 2013-12-17 NOTE — Consult Note (Signed)
Admit date: 12/16/2013 Referring Physician  Dr. Ernestina Patches Primary Physician Woody Seller, MD Primary Cardiologist  None Reason for Consultation  Chest pain  HPI: 63 year old male with hyperlipidemia, hypertension, obesity, father with heart disease, former smoker who presented to the emergency room with chest discomfort. He was at [facility] yesterday when noticed sudden onset central chest pressure. No radiation of symptoms. Lasted a few minutes in duration and may have been associated with nausea and diaphoresis. Went to urgent care with his wife. Nitroglycerin and aspirin seemed to help resolve chest pain. No prior MI history. No current chest pain. He was quite tired this morning.  Troponins are normal. Creatinine slightly elevated at 1.36. LDL cholesterol 122. Hemoglobin 12.9. Hemoglobin A1c 5.4  Chest x-ray is unremarkable. Personally viewed. EKG shows sinus rhythm, poor R wave progression possible old anterior infarct pattern, no ST segment changes.  PMH:   Past Medical History  Diagnosis Date  . Hypertension   . Hyperlipemia   . Chronic pain     neck;lower back  . Multiple gastric ulcers     PSH:   Past Surgical History  Procedure Laterality Date  . Neck surgery    . Total knee arthroplasty Left   . Total hip arthroplasty Left 2002  . Joint replacement     Allergies:  Aspirin Prior to Admit Meds:   Prior to Admission medications   Medication Sig Start Date End Date Taking? Authorizing Provider  clonazePAM (KLONOPIN) 0.5 MG tablet Take 0.5 mg by mouth 2 (two) times daily as needed for anxiety.   Yes Historical Provider, MD  dimenhyDRINATE (DRAMAMINE) 50 MG tablet Take 50 mg by mouth every 8 (eight) hours as needed for nausea or dizziness.   Yes Historical Provider, MD  gabapentin (NEURONTIN) 300 MG capsule Take 300 mg by mouth 3 (three) times daily.   Yes Historical Provider, MD  ketorolac (TORADOL) 10 MG tablet Take 10 mg by mouth every 6 (six) hours as needed for  moderate pain.   Yes Historical Provider, MD  lisinopril (PRINIVIL,ZESTRIL) 10 MG tablet Take 10 mg by mouth daily.   Yes Historical Provider, MD  rOPINIRole (REQUIP) 0.5 MG tablet Take 0.5 mg by mouth 2 (two) times daily.   Yes Historical Provider, MD  rosuvastatin (CRESTOR) 20 MG tablet Take 20 mg by mouth daily.   Yes Historical Provider, MD  tetrahydrozoline 0.05 % ophthalmic solution Place 1 drop into both eyes 3 (three) times daily as needed (dry eyes).   Yes Historical Provider, MD  aspirin 325 MG tablet Take 325 mg by mouth daily.    Historical Provider, MD  nitroGLYCERIN (NITROSTAT) 0.4 MG SL tablet Place 0.4 mg under the tongue every 5 (five) minutes as needed for chest pain.    Historical Provider, MD   Fam HX:    Family History  Problem Relation Age of Onset  . Heart failure Father   . Diabetes Father   . Hypertension Father    Social HX:    History   Social History  . Marital Status: Married    Spouse Name: N/A    Number of Children: N/A  . Years of Education: N/A   Occupational History  . Not on file.   Social History Main Topics  . Smoking status: Former Smoker    Types: Cigarettes    Quit date: 10/09/2013  . Smokeless tobacco: Not on file  . Alcohol Use: No  . Drug Use: No  . Sexual Activity: Not on file  Other Topics Concern  . Not on file   Social History Narrative  . No narrative on file     ROS:  Denies any strokelike symptoms, fevers, rash, syncope, palpitations, orthopnea, bleeding All 11 ROS were addressed and are negative except what is stated in the HPI   Physical Exam: Blood pressure 126/70, pulse 70, temperature 97.8 F (36.6 C), temperature source Oral, resp. rate 18, height 6\' 1"  (1.854 m), weight 231 lb 9.6 oz (105.053 kg), SpO2 99.00%.   General: Well developed, well nourished, in no acute distress Head: Eyes PERRLA, No xanthomas.   Normal cephalic and atramatic  Lungs:   Clear bilaterally to auscultation and percussion. Normal  respiratory effort. No wheezes, no rales. Heart:   HRRR S1 S2 Pulses are 2+ & equal. No murmur, rubs, gallops.  No carotid bruit. No JVD.  No abdominal bruits.  Abdomen: Bowel sounds are positive, abdomen soft and non-tender without masses. No hepatosplenomegaly. Overweight Msk:  Back normal. Normal strength and tone for age. Extremities:  No clubbing, cyanosis or edema.  DP +1 Neuro: Alert and oriented X 3, non-focal, MAE x 4 GU: Deferred Rectal: Deferred Psych:  Good affect, responds appropriately      Labs: Lab Results  Component Value Date   WBC 5.1 12/17/2013   HGB 12.9* 12/17/2013   HCT 39.2 12/17/2013   MCV 94.5 12/17/2013   PLT 163 12/17/2013     Recent Labs Lab 12/17/13 0300  NA 140  K 3.9  CL 100  CO2 28  BUN 16  CREATININE 1.36*  CALCIUM 9.3  PROT 6.0  BILITOT 0.2*  ALKPHOS 72  ALT 13  AST 14  GLUCOSE 98    Recent Labs  12/16/13 2034 12/17/13 0300  TROPONINI <0.30 <0.30   Lab Results  Component Value Date   CHOL 194 12/16/2013   HDL 39* 12/16/2013   LDLCALC 122* 12/16/2013   TRIG 164* 12/16/2013   No results found for this basename: DDIMER     Radiology:  Dg Chest 2 View  12/16/2013   CLINICAL DATA:  Left-sided chest pain.  Short of breath.  EXAM: CHEST  2 VIEW  COMPARISON:  05/21/2009  FINDINGS: Cardiac silhouette is normal in size. Normal mediastinal and hilar contours.  Clear lungs.  No pleural effusion or pneumothorax.  Changes from a cervical spine fusion as well as right rotator cuff surgery. Bony thorax is demineralized.  IMPRESSION: No acute cardiopulmonary disease.   Electronically Signed   By: Lajean Manes M.D.   On: 12/16/2013 18:13   Personally viewed.  EKG:  As above Personally viewed.   ASSESSMENT/PLAN:   63 year old male with hyperlipidemia, hypertension, obesity, father with CAD, former smoker with chest pain.  1. Chest pain-given his multiple cardiac risk factors, I will order nuclear stress test. He did state that he may have  trouble on a treadmill with his right foot. He may need to be a pharmacologic study. Differential diagnosis includes ischemia, GI, musculoskeletal. He does have a history of low back pain.  2. Hyperlipidemia-continue with Crestor. LDL currently 120. Please make sure compliance present.  3. Obesity-encourage weight loss.  Candee Furbish, MD  12/17/2013  8:06 AM

## 2013-12-17 NOTE — Progress Notes (Signed)
UR completed 

## 2013-12-24 NOTE — ED Provider Notes (Signed)
I saw and evaluated the patient, reviewed the resident's note and I agree with the findings and plan.   .Face to face Exam:  General:  Awake HEENT:  Atraumatic Resp:  Normal effort Abd:  Nondistended Neuro:No focal weakness  EKG was reviewed and discussed with resident  Dot Lanes, MD 12/24/13 [phone]

## 2014-04-26 ENCOUNTER — Emergency Department (HOSPITAL_COMMUNITY): Payer: Medicare Other

## 2014-04-26 ENCOUNTER — Encounter (HOSPITAL_COMMUNITY): Payer: Self-pay | Admitting: Emergency Medicine

## 2014-04-26 ENCOUNTER — Inpatient Hospital Stay (HOSPITAL_COMMUNITY)
Admission: EM | Admit: 2014-04-26 | Discharge: 2014-04-28 | DRG: 917 | Disposition: A | Discharge status: Home / Self Care | Payer: Medicare Other | Attending: Internal Medicine | Admitting: Internal Medicine

## 2014-04-26 DIAGNOSIS — I252 Old myocardial infarction: Secondary | ICD-10-CM

## 2014-04-26 DIAGNOSIS — R52 Pain, unspecified: Secondary | ICD-10-CM | POA: Diagnosis present

## 2014-04-26 DIAGNOSIS — Z96659 Presence of unspecified artificial knee joint: Secondary | ICD-10-CM

## 2014-04-26 DIAGNOSIS — T50904A Poisoning by unspecified drugs, medicaments and biological substances, undetermined, initial encounter: Secondary | ICD-10-CM | POA: Diagnosis present

## 2014-04-26 DIAGNOSIS — R4781 Slurred speech: Secondary | ICD-10-CM

## 2014-04-26 DIAGNOSIS — G8929 Other chronic pain: Secondary | ICD-10-CM | POA: Diagnosis present

## 2014-04-26 DIAGNOSIS — G92 Toxic encephalopathy: Secondary | ICD-10-CM | POA: Diagnosis present

## 2014-04-26 DIAGNOSIS — G929 Unspecified toxic encephalopathy: Secondary | ICD-10-CM | POA: Diagnosis present

## 2014-04-26 DIAGNOSIS — K259 Gastric ulcer, unspecified as acute or chronic, without hemorrhage or perforation: Secondary | ICD-10-CM | POA: Diagnosis present

## 2014-04-26 DIAGNOSIS — Z6831 Body mass index (BMI) 31.0-31.9, adult: Secondary | ICD-10-CM

## 2014-04-26 DIAGNOSIS — E7800 Pure hypercholesterolemia, unspecified: Secondary | ICD-10-CM

## 2014-04-26 DIAGNOSIS — Z87891 Personal history of nicotine dependence: Secondary | ICD-10-CM

## 2014-04-26 DIAGNOSIS — R200 Anesthesia of skin: Secondary | ICD-10-CM

## 2014-04-26 DIAGNOSIS — G9340 Encephalopathy, unspecified: Secondary | ICD-10-CM

## 2014-04-26 DIAGNOSIS — R29898 Other symptoms and signs involving the musculoskeletal system: Secondary | ICD-10-CM

## 2014-04-26 DIAGNOSIS — T50901A Poisoning by unspecified drugs, medicaments and biological substances, accidental (unintentional), initial encounter: Principal | ICD-10-CM

## 2014-04-26 DIAGNOSIS — Z79899 Other long term (current) drug therapy: Secondary | ICD-10-CM

## 2014-04-26 DIAGNOSIS — I10 Essential (primary) hypertension: Secondary | ICD-10-CM

## 2014-04-26 DIAGNOSIS — R531 Weakness: Secondary | ICD-10-CM

## 2014-04-26 DIAGNOSIS — G2581 Restless legs syndrome: Secondary | ICD-10-CM | POA: Diagnosis present

## 2014-04-26 DIAGNOSIS — Z96649 Presence of unspecified artificial hip joint: Secondary | ICD-10-CM

## 2014-04-26 DIAGNOSIS — E785 Hyperlipidemia, unspecified: Secondary | ICD-10-CM | POA: Diagnosis present

## 2014-04-26 DIAGNOSIS — H3410 Central retinal artery occlusion, unspecified eye: Secondary | ICD-10-CM | POA: Diagnosis present

## 2014-04-26 DIAGNOSIS — R4182 Altered mental status, unspecified: Secondary | ICD-10-CM

## 2014-04-26 DIAGNOSIS — Z8669 Personal history of other diseases of the nervous system and sense organs: Secondary | ICD-10-CM

## 2014-04-26 DIAGNOSIS — Z886 Allergy status to analgesic agent status: Secondary | ICD-10-CM

## 2014-04-26 DIAGNOSIS — E669 Obesity, unspecified: Secondary | ICD-10-CM

## 2014-04-26 DIAGNOSIS — R202 Paresthesia of skin: Secondary | ICD-10-CM

## 2014-04-26 HISTORY — DX: Personal history of other diseases of the nervous system and sense organs: Z86.69

## 2014-04-26 LAB — DIFFERENTIAL
BASOS PCT: 0 % (ref 0–1)
Basophils Absolute: 0 10*3/uL (ref 0.0–0.1)
Eosinophils Absolute: 0.1 10*3/uL (ref 0.0–0.7)
Eosinophils Relative: 3 % (ref 0–5)
LYMPHS ABS: 1.5 10*3/uL (ref 0.7–4.0)
Lymphocytes Relative: 30 % (ref 12–46)
Monocytes Absolute: 0.4 10*3/uL (ref 0.1–1.0)
Monocytes Relative: 9 % (ref 3–12)
NEUTROS ABS: 2.9 10*3/uL (ref 1.7–7.7)
NEUTROS PCT: 58 % (ref 43–77)

## 2014-04-26 LAB — CBC
HCT: 40.4 % (ref 39.0–52.0)
HEMOGLOBIN: 12.8 g/dL — AB (ref 13.0–17.0)
MCH: 29.7 pg (ref 26.0–34.0)
MCHC: 31.7 g/dL (ref 30.0–36.0)
MCV: 93.7 fL (ref 78.0–100.0)
Platelets: 173 10*3/uL (ref 150–400)
RBC: 4.31 MIL/uL (ref 4.22–5.81)
RDW: 13.4 % (ref 11.5–15.5)
WBC: 5 10*3/uL (ref 4.0–10.5)

## 2014-04-26 LAB — I-STAT TROPONIN, ED: TROPONIN I, POC: 0.01 ng/mL (ref 0.00–0.08)

## 2014-04-26 LAB — COMPREHENSIVE METABOLIC PANEL
ALBUMIN: 3.7 g/dL (ref 3.5–5.2)
ALT: 18 U/L (ref 0–53)
ANION GAP: 12 (ref 5–15)
AST: 30 U/L (ref 0–37)
Alkaline Phosphatase: 87 U/L (ref 39–117)
BILIRUBIN TOTAL: 0.4 mg/dL (ref 0.3–1.2)
BUN: 15 mg/dL (ref 6–23)
CHLORIDE: 102 meq/L (ref 96–112)
CO2: 28 mEq/L (ref 19–32)
Calcium: 9.7 mg/dL (ref 8.4–10.5)
Creatinine, Ser: 1.12 mg/dL (ref 0.50–1.35)
GFR calc Af Amer: 80 mL/min — ABNORMAL LOW (ref >90)
GFR calc non Af Amer: 69 mL/min — ABNORMAL LOW (ref >90)
Glucose, Bld: 104 mg/dL — ABNORMAL HIGH (ref 70–99)
POTASSIUM: 4.5 meq/L (ref 3.7–5.3)
Sodium: 142 mEq/L (ref 137–147)
Total Protein: 6.7 g/dL (ref 6.0–8.3)

## 2014-04-26 LAB — I-STAT CHEM 8, ED
BUN: 15 mg/dL (ref 6–23)
CHLORIDE: 103 meq/L (ref 96–112)
Calcium, Ion: 1.18 mmol/L (ref 1.13–1.30)
Creatinine, Ser: 1.2 mg/dL (ref 0.50–1.35)
Glucose, Bld: 100 mg/dL — ABNORMAL HIGH (ref 70–99)
HCT: 41 % (ref 39.0–52.0)
Hemoglobin: 13.9 g/dL (ref 13.0–17.0)
Potassium: 4.4 mEq/L (ref 3.7–5.3)
SODIUM: 141 meq/L (ref 137–147)
TCO2: 28 mmol/L (ref 0–100)

## 2014-04-26 LAB — PROTIME-INR
INR: 1.03 (ref 0.00–1.49)
PROTHROMBIN TIME: 13.5 s (ref 11.6–15.2)

## 2014-04-26 LAB — ETHANOL: Alcohol, Ethyl (B): <11 mg/dL (ref 0–11)

## 2014-04-26 LAB — APTT: aPTT: 34 seconds (ref 24–37)

## 2014-04-26 NOTE — ED Provider Notes (Signed)
CSN: [id number]     Arrival date & time 04/26/14  2159 History   First MD Initiated Contact with Patient 04/26/14 2221     Chief Complaint  Patient presents with  . Altered Mental Status     (Consider location/radiation/quality/duration/timing/severity/associated sxs/prior Treatment) HPI Comments: Patient presents to the emergency department with chief complaint of altered mental status, and weakness. He is accompanied by his family members, and states the patient has been "groggy." He also stated the patient's complaint of left-sided facial numbness for the past couple of days, and generalized weakness on the right side for the past week or so. They say that the patient has been dropping things. Patient has a history of chronic back and neck pain, and reportedly takes a lot of pain medicine for this. They state that it is common for him to take more pain medicine than is probably good for him.  History from patient is limited 2/2 to mental status.  The history is provided by the patient. No language interpreter was used.    Past Medical History  Diagnosis Date  . Hypertension   . Hyperlipemia   . Chronic pain     neck;lower back  . Multiple gastric ulcers    Past Surgical History  Procedure Laterality Date  . Neck surgery    . Total knee arthroplasty Left   . Total hip arthroplasty Left 2002  . Joint replacement    . Back surgery     Family History  Problem Relation Age of Onset  . Heart failure Father   . Diabetes Father   . Hypertension Father    History  Substance Use Topics  . Smoking status: Former Smoker    Types: Cigarettes    Quit date: 10/09/2013  . Smokeless tobacco: Not on file  . Alcohol Use: Yes    Review of Systems  All other systems reviewed and are negative.     Allergies  Morphine and related and Aspirin  Home Medications   Prior to Admission medications   Medication Sig Start Date End Date Taking? Authorizing Provider  clonazePAM (KLONOPIN)  0.5 MG tablet Take 0.5 mg by mouth 2 (two) times daily as needed for anxiety.    Historical Provider, MD  dimenhyDRINATE (DRAMAMINE) 50 MG tablet Take 50 mg by mouth every 8 (eight) hours as needed for nausea or dizziness.    Historical Provider, MD  gabapentin (NEURONTIN) 300 MG capsule Take 300 mg by mouth 3 (three) times daily.    Historical Provider, MD  ketorolac (TORADOL) 10 MG tablet Take 10 mg by mouth every 6 (six) hours as needed for moderate pain.    Historical Provider, MD  nitroGLYCERIN (NITROSTAT) 0.4 MG SL tablet Place 0.4 mg under the tongue every 5 (five) minutes as needed for chest pain.    Historical Provider, MD  rOPINIRole (REQUIP) 1 MG tablet Take 1 mg by mouth 2 (two) times daily.     Historical Provider, MD  simvastatin (ZOCOR) 40 MG tablet Take 40 mg by mouth at bedtime.    Historical Provider, MD  tetrahydrozoline 0.05 % ophthalmic solution Place 1 drop into both eyes 3 (three) times daily as needed (dry eyes).    Historical Provider, MD   BP 153/86  Pulse 101  Temp(Src) 99 F (37.2 C)  Resp 20  Ht 6\' 1"  (1.854 m)  Wt 255 lb (115.667 kg)  BMI 33.65 kg/m2  SpO2 95% Physical Exam  Nursing note and vitals reviewed. Constitutional: He is  oriented to person, place, and time. He appears well-developed and well-nourished.  Patient is sleepy but arousable  HENT:  Head: Normocephalic and atraumatic.  Eyes: Conjunctivae and EOM are normal. Pupils are equal, round, and reactive to light. Right eye exhibits no discharge. Left eye exhibits no discharge. No scleral icterus.  Neck: Normal range of motion. Neck supple. No JVD present.  Cardiovascular: Normal rate, regular rhythm and normal heart sounds.  Exam reveals no gallop and no friction rub.   No murmur heard. Pulmonary/Chest: Effort normal and breath sounds normal. No respiratory distress. He has no wheezes. He has no rales. He exhibits no tenderness.  Abdominal: Soft. He exhibits no distension and no mass. There is no  tenderness. There is no rebound and no guarding.  No focal abdominal tenderness, no RLQ tenderness or pain at McBurney's point, no RUQ tenderness or Murphy's sign, no left-sided abdominal tenderness, no fluid wave, or signs of peritonitis   Musculoskeletal: Normal range of motion. He exhibits no edema and no tenderness.  Moves all extremities  Neurological: He is alert and oriented to person, place, and time.  CN 3-12 intact, normal finger to nose, no pronator drift  Skin: Skin is warm and dry.  Psychiatric: He has a normal mood and affect. His behavior is normal. Judgment and thought content normal.    ED Course  Procedures (including critical care time) Results for orders placed during the hospital encounter of 04/26/14  ETHANOL      Result Value Ref Range   Alcohol, Ethyl (B) <11  0 - 11 mg/dL  CBC      Result Value Ref Range   WBC 5.0  4.0 - 10.5 K/uL   RBC 4.31  4.22 - 5.81 MIL/uL   Hemoglobin 12.8 (*) 13.0 - 17.0 g/dL   HCT 40.4  39.0 - 52.0 %   MCV 93.7  78.0 - 100.0 fL   MCH 29.7  26.0 - 34.0 pg   MCHC 31.7  30.0 - 36.0 g/dL   RDW 13.4  11.5 - 15.5 %   Platelets 173  150 - 400 K/uL  DIFFERENTIAL      Result Value Ref Range   Neutrophils Relative % 58  43 - 77 %   Neutro Abs 2.9  1.7 - 7.7 K/uL   Lymphocytes Relative 30  12 - 46 %   Lymphs Abs 1.5  0.7 - 4.0 K/uL   Monocytes Relative 9  3 - 12 %   Monocytes Absolute 0.4  0.1 - 1.0 K/uL   Eosinophils Relative 3  0 - 5 %   Eosinophils Absolute 0.1  0.0 - 0.7 K/uL   Basophils Relative 0  0 - 1 %   Basophils Absolute 0.0  0.0 - 0.1 K/uL  COMPREHENSIVE METABOLIC PANEL      Result Value Ref Range   Sodium 142  137 - 147 mEq/L   Potassium 4.5  3.7 - 5.3 mEq/L   Chloride 102  96 - 112 mEq/L   CO2 28  19 - 32 mEq/L   Glucose, Bld 104 (*) 70 - 99 mg/dL   BUN 15  6 - 23 mg/dL   Creatinine, Ser 1.12  0.50 - 1.35 mg/dL   Calcium 9.7  8.4 - 10.5 mg/dL   Total Protein 6.7  6.0 - 8.3 g/dL   Albumin 3.7  3.5 - 5.2 g/dL   AST  30  0 - 37 U/L   ALT 18  0 - 53 U/L  Alkaline Phosphatase 87  39 - 117 U/L   Total Bilirubin 0.4  0.3 - 1.2 mg/dL   GFR calc non Af Amer 69 (*) >90 mL/min   GFR calc Af Amer 80 (*) >90 mL/min   Anion gap 12  5 - 15  URINE RAPID DRUG SCREEN (HOSP PERFORMED)      Result Value Ref Range   Opiates POSITIVE (*) NONE DETECTED   Cocaine NONE DETECTED  NONE DETECTED   Benzodiazepines POSITIVE (*) NONE DETECTED   Amphetamines NONE DETECTED  NONE DETECTED   Tetrahydrocannabinol NONE DETECTED  NONE DETECTED   Barbiturates NONE DETECTED  NONE DETECTED  URINALYSIS, ROUTINE W REFLEX MICROSCOPIC      Result Value Ref Range   Color, Urine AMBER (*) YELLOW   APPearance CLEAR  CLEAR   Specific Gravity, Urine 1.023  1.005 - 1.030   pH 5.0  5.0 - 8.0   Glucose, UA NEGATIVE  NEGATIVE mg/dL   Hgb urine dipstick NEGATIVE  NEGATIVE   Bilirubin Urine SMALL (*) NEGATIVE   Ketones, ur NEGATIVE  NEGATIVE mg/dL   Protein, ur 30 (*) NEGATIVE mg/dL   Urobilinogen, UA 1.0  0.0 - 1.0 mg/dL   Nitrite NEGATIVE  NEGATIVE   Leukocytes, UA NEGATIVE  NEGATIVE  PROTIME-INR      Result Value Ref Range   Prothrombin Time 13.5  11.6 - 15.2 seconds   INR 1.03  0.00 - 1.49  APTT      Result Value Ref Range   aPTT 34  24 - 37 seconds  SALICYLATE LEVEL      Result Value Ref Range   Salicylate Lvl <6.6 (*) 2.8 - 20.0 mg/dL  ACETAMINOPHEN LEVEL      Result Value Ref Range   Acetaminophen (Tylenol), Serum <15.0  10 - 30 ug/mL  URINE MICROSCOPIC-ADD ON      Result Value Ref Range   Bacteria, UA FEW (*) RARE   Urine-Other MUCOUS PRESENT    I-STAT CHEM 8, ED      Result Value Ref Range   Sodium 141  137 - 147 mEq/L   Potassium 4.4  3.7 - 5.3 mEq/L   Chloride 103  96 - 112 mEq/L   BUN 15  6 - 23 mg/dL   Creatinine, Ser 1.20  0.50 - 1.35 mg/dL   Glucose, Bld 100 (*) 70 - 99 mg/dL   Calcium, Ion 1.18  1.13 - 1.30 mmol/L   TCO2 28  0 - 100 mmol/L   Hemoglobin 13.9  13.0 - 17.0 g/dL   HCT 41.0  39.0 - 52.0 %   I-STAT TROPOININ, ED      Result Value Ref Range   Troponin i, poc 0.01  0.00 - 0.08 ng/mL   Comment 3           I-STAT TROPOININ, ED      Result Value Ref Range   Troponin i, poc 0.00  0.00 - 0.08 ng/mL   Comment 3            Ct Head Wo Contrast  04/27/2014   CLINICAL DATA:  Recent falling episodes, general weakness left greater than right, increased somnolence, left facial numbness  EXAM: CT HEAD WITHOUT CONTRAST  TECHNIQUE: Contiguous axial images were obtained from the base of the skull through the vertex without intravenous contrast.  COMPARISON:  None.  FINDINGS: Mild diffuse age-related atrophy. No evidence of mass or infarct. No hemorrhage or extra-axial fluid. Calvarium is intact. No significant inflammatory  change in the paranasal sinuses. There is a radiodense 9 mm lesion in the left scalp posteriorly which appears contiguous with bone, although it is less dense than bone.  IMPRESSION: No acute intracranial findings. Incidental note of a a 9 mm mass in the left posterior scalp likely not of acute significance.   Electronically Signed   By: Skipper Cliche M.D.   On: 04/27/2014 00:27     Imaging Review No results found.   EKG Interpretation   Date/Time:  Friday April 26 2014 22:19:33 EDT Ventricular Rate:  92 PR Interval:  214 QRS Duration: 84 QT Interval:  395 QTC Calculation: 489 R Axis:   7 Text Interpretation:  Sinus rhythm Borderline prolonged PR interval  Anterior infarct, old Confirmed by DOCHERTY  MD, [place] [phone]) on 04/26/2014  10:23:47 PM      MDM   Final diagnoses:  Weakness  Slurred speech  Altered mental status, unspecified altered mental status type    Patient with altered mental status vs. overdose vs. stroke. Check labs. Will reassess. Patient is not in any apparent distress. He is oxygenating well. He is sleepy but arousable.  12:43 AM Patient seen by and discussed with Dr. Sabra Heck, who recommends admission and neurology consultation.  I  discussed patient with Dr. Nicole Kindred from neurology, who recommends MRI brain for stroke rule out. Will patient's medicine.  Patient appears to be responding to Narcan, he is sitting up, is much more alert.  12:56 AM Discussed the patient with Dr. Sheran Fava from [facility], who will admit the patient.  Montine Circle, PA-C 04/27/14 [phone]

## 2014-04-26 NOTE — ED Provider Notes (Signed)
63 year old male with a history of chronic pain presents with a complaint of altered mental status. According to family members she has had approximately one week of gradual decline everyday becoming more severely confused, persistently dropping things with his right hand and have difficulty ambulating. He has had multiple falls. The patient complains of numbness to the left side of his face and on exam he does in fact have an objective difference in sensation to the right and left side of the face. There is no obvious facial droop, cranial nerves III through XII are otherwise intact, he is able to lift all 4 extremities with normal strength but on strength testing he has a slight difference in strength of the right upper extremity which is weaker than the left upper extremity at the biceps. He can straight leg raise bilaterally, there is no pronator drift, he does have a slight slurred speech but appears somnolent. The family member states that he has a history of taking opiate medications more than he should but is not prescribe any at this time. He is prescribed Neurontin and Flexeril. Will give Narcan, CT scan of the head looking for stroke, labs, anticipate admission to the hospital.  Medical screening examination/treatment/procedure(s) were conducted as a shared visit with non-physician practitioner(s) and myself.  I personally evaluated the patient during the encounter.  Clinical Impression: Altered MS      Johnna Acosta, MD 04/27/14 [phone]

## 2014-04-26 NOTE — ED Notes (Signed)
Family reports that pt has been sleeping more and falling at home; pt awake and responsive in triage but groggy; pt c/o left sided facial numbness; pt c/o generalized weakness greater on left than right; c/o increased chronic back and neck pain

## 2014-04-27 ENCOUNTER — Encounter (HOSPITAL_COMMUNITY): Payer: Self-pay | Admitting: Internal Medicine

## 2014-04-27 ENCOUNTER — Inpatient Hospital Stay (HOSPITAL_COMMUNITY): Payer: Medicare Other

## 2014-04-27 DIAGNOSIS — G8929 Other chronic pain: Secondary | ICD-10-CM | POA: Diagnosis present

## 2014-04-27 DIAGNOSIS — R200 Anesthesia of skin: Secondary | ICD-10-CM

## 2014-04-27 DIAGNOSIS — Z886 Allergy status to analgesic agent status: Secondary | ICD-10-CM | POA: Diagnosis not present

## 2014-04-27 DIAGNOSIS — G92 Toxic encephalopathy: Secondary | ICD-10-CM | POA: Diagnosis present

## 2014-04-27 DIAGNOSIS — R29898 Other symptoms and signs involving the musculoskeletal system: Secondary | ICD-10-CM

## 2014-04-27 DIAGNOSIS — G929 Unspecified toxic encephalopathy: Secondary | ICD-10-CM | POA: Diagnosis present

## 2014-04-27 DIAGNOSIS — R209 Unspecified disturbances of skin sensation: Secondary | ICD-10-CM

## 2014-04-27 DIAGNOSIS — R202 Paresthesia of skin: Secondary | ICD-10-CM

## 2014-04-27 DIAGNOSIS — I252 Old myocardial infarction: Secondary | ICD-10-CM | POA: Diagnosis not present

## 2014-04-27 DIAGNOSIS — Z96659 Presence of unspecified artificial knee joint: Secondary | ICD-10-CM | POA: Diagnosis not present

## 2014-04-27 DIAGNOSIS — H3410 Central retinal artery occlusion, unspecified eye: Secondary | ICD-10-CM | POA: Diagnosis present

## 2014-04-27 DIAGNOSIS — I10 Essential (primary) hypertension: Secondary | ICD-10-CM

## 2014-04-27 DIAGNOSIS — T50904A Poisoning by unspecified drugs, medicaments and biological substances, undetermined, initial encounter: Secondary | ICD-10-CM | POA: Diagnosis present

## 2014-04-27 DIAGNOSIS — R5383 Other fatigue: Secondary | ICD-10-CM

## 2014-04-27 DIAGNOSIS — T50901A Poisoning by unspecified drugs, medicaments and biological substances, accidental (unintentional), initial encounter: Secondary | ICD-10-CM

## 2014-04-27 DIAGNOSIS — R5381 Other malaise: Secondary | ICD-10-CM

## 2014-04-27 DIAGNOSIS — R4182 Altered mental status, unspecified: Secondary | ICD-10-CM

## 2014-04-27 DIAGNOSIS — Z79899 Other long term (current) drug therapy: Secondary | ICD-10-CM | POA: Diagnosis not present

## 2014-04-27 DIAGNOSIS — Z96649 Presence of unspecified artificial hip joint: Secondary | ICD-10-CM | POA: Diagnosis not present

## 2014-04-27 DIAGNOSIS — Z87891 Personal history of nicotine dependence: Secondary | ICD-10-CM | POA: Diagnosis not present

## 2014-04-27 DIAGNOSIS — K259 Gastric ulcer, unspecified as acute or chronic, without hemorrhage or perforation: Secondary | ICD-10-CM | POA: Diagnosis present

## 2014-04-27 DIAGNOSIS — G2581 Restless legs syndrome: Secondary | ICD-10-CM | POA: Diagnosis present

## 2014-04-27 DIAGNOSIS — E785 Hyperlipidemia, unspecified: Secondary | ICD-10-CM | POA: Diagnosis present

## 2014-04-27 DIAGNOSIS — E669 Obesity, unspecified: Secondary | ICD-10-CM | POA: Diagnosis present

## 2014-04-27 DIAGNOSIS — Z6831 Body mass index (BMI) 31.0-31.9, adult: Secondary | ICD-10-CM | POA: Diagnosis not present

## 2014-04-27 DIAGNOSIS — R52 Pain, unspecified: Secondary | ICD-10-CM | POA: Diagnosis present

## 2014-04-27 LAB — URINALYSIS, ROUTINE W REFLEX MICROSCOPIC
Glucose, UA: NEGATIVE mg/dL
Hgb urine dipstick: NEGATIVE
KETONES UR: NEGATIVE mg/dL
Leukocytes, UA: NEGATIVE
Nitrite: NEGATIVE
Protein, ur: 30 mg/dL — AB
SPECIFIC GRAVITY, URINE: 1.023 (ref 1.005–1.030)
UROBILINOGEN UA: 1 mg/dL (ref 0.0–1.0)
pH: 5 (ref 5.0–8.0)

## 2014-04-27 LAB — RAPID URINE DRUG SCREEN, HOSP PERFORMED
AMPHETAMINES: NOT DETECTED
BARBITURATES: NOT DETECTED
BENZODIAZEPINES: POSITIVE — AB
COCAINE: NOT DETECTED
Opiates: POSITIVE — AB
TETRAHYDROCANNABINOL: NOT DETECTED

## 2014-04-27 LAB — BLOOD GAS, ARTERIAL
Acid-Base Excess: 2.5 mmol/L — ABNORMAL HIGH (ref 0.0–2.0)
Bicarbonate: 27.2 mEq/L — ABNORMAL HIGH (ref 20.0–24.0)
Drawn by: 308601
FIO2: 0.21 %
O2 Saturation: 87.3 %
PH ART: 7.398 (ref 7.350–7.450)
Patient temperature: 98.6
TCO2: 24.6 mmol/L (ref 0–100)
pCO2 arterial: 45.1 mmHg — ABNORMAL HIGH (ref 35.0–45.0)
pO2, Arterial: 53.7 mmHg — ABNORMAL LOW (ref 80.0–100.0)

## 2014-04-27 LAB — URINE MICROSCOPIC-ADD ON

## 2014-04-27 LAB — I-STAT TROPONIN, ED: Troponin i, poc: 0 ng/mL (ref 0.00–0.08)

## 2014-04-27 LAB — ACETAMINOPHEN LEVEL: Acetaminophen (Tylenol), Serum: <15 ug/mL (ref 10–30)

## 2014-04-27 LAB — SALICYLATE LEVEL: Salicylate Lvl: <2 mg/dL — ABNORMAL LOW (ref 2.8–20.0)

## 2014-04-27 MED ORDER — PANTOPRAZOLE SODIUM 40 MG PO TBEC
40.0000 mg | DELAYED_RELEASE_TABLET | Freq: Every day | ORAL | Status: DC
Start: 2014-04-27 — End: 2014-04-28
  Administered 2014-04-27 – 2014-04-28 (×2): 40 mg via ORAL
  Filled 2014-04-27 (×2): qty 1

## 2014-04-27 MED ORDER — NALOXONE HCL 1 MG/ML IJ SOLN
2.0000 mg | Freq: Once | INTRAMUSCULAR | Status: AC
  Administered 2014-04-27: 2 mg via INTRAVENOUS

## 2014-04-27 MED ORDER — SENNOSIDES-DOCUSATE SODIUM 8.6-50 MG PO TABS: 2.0000 | ORAL_TABLET | Freq: Every evening | ORAL | Status: DC | PRN

## 2014-04-27 MED ORDER — NALOXONE HCL 1 MG/ML IJ SOLN
1.0000 mg | Freq: Once | INTRAMUSCULAR | Status: DC
  Filled 2014-04-27: qty 2

## 2014-04-27 MED ORDER — KETOROLAC TROMETHAMINE 10 MG PO TABS
10.0000 mg | ORAL_TABLET | Freq: Four times a day (QID) | ORAL | Status: DC | PRN
  Administered 2014-04-27: 10 mg via ORAL
  Filled 2014-04-27 (×3): qty 1

## 2014-04-27 MED ORDER — GABAPENTIN 300 MG PO CAPS
300.0000 mg | ORAL_CAPSULE | Freq: Three times a day (TID) | ORAL | Status: DC
  Filled 2014-04-27 (×3): qty 1

## 2014-04-27 MED ORDER — NALOXONE HCL 1 MG/ML IJ SOLN
INTRAMUSCULAR | Status: AC
  Administered 2014-04-27: 2 mg via INTRAVENOUS
  Filled 2014-04-27: qty 2

## 2014-04-27 MED ORDER — LORAZEPAM 2 MG/ML IJ SOLN: 1.0000 mg | Freq: Four times a day (QID) | INTRAMUSCULAR | Status: DC | PRN

## 2014-04-27 MED ORDER — LORAZEPAM 1 MG PO TABS: 1.0000 mg | ORAL_TABLET | Freq: Four times a day (QID) | ORAL | Status: DC | PRN

## 2014-04-27 MED ORDER — ACETAMINOPHEN 650 MG RE SUPP: 650.0000 mg | RECTAL | Status: DC | PRN

## 2014-04-27 MED ORDER — SIMVASTATIN 40 MG PO TABS
40.0000 mg | ORAL_TABLET | Freq: Every day | ORAL | Status: DC
  Administered 2014-04-27: 40 mg via ORAL
  Filled 2014-04-27 (×3): qty 1

## 2014-04-27 MED ORDER — GABAPENTIN 300 MG PO CAPS
300.0000 mg | ORAL_CAPSULE | Freq: Three times a day (TID) | ORAL | Status: DC
  Administered 2014-04-28: 300 mg via ORAL
  Filled 2014-04-27: qty 1

## 2014-04-27 MED ORDER — ASPIRIN 300 MG RE SUPP
300.0000 mg | Freq: Every day | RECTAL | Status: DC
  Filled 2014-04-27: qty 1

## 2014-04-27 MED ORDER — NAPHAZOLINE HCL 0.1 % OP SOLN
1.0000 [drp] | Freq: Four times a day (QID) | OPHTHALMIC | Status: DC | PRN
  Filled 2014-04-27: qty 15

## 2014-04-27 MED ORDER — ASPIRIN 325 MG PO TABS
325.0000 mg | ORAL_TABLET | Freq: Every day | ORAL | Status: DC
  Administered 2014-04-27 – 2014-04-28 (×2): 325 mg via ORAL
  Filled 2014-04-27 (×3): qty 1

## 2014-04-27 MED ORDER — NALOXONE HCL 0.4 MG/ML IJ SOLN: 0.4000 mg | Freq: Once | INTRAMUSCULAR | Status: DC

## 2014-04-27 MED ORDER — LISINOPRIL 10 MG PO TABS
10.0000 mg | ORAL_TABLET | Freq: Every day | ORAL | Status: DC
  Administered 2014-04-27 – 2014-04-28 (×2): 10 mg via ORAL
  Filled 2014-04-27 (×3): qty 1

## 2014-04-27 MED ORDER — ACETAMINOPHEN 325 MG PO TABS
650.0000 mg | ORAL_TABLET | ORAL | Status: DC | PRN
  Administered 2014-04-27: 650 mg via ORAL
  Filled 2014-04-27: qty 2

## 2014-04-27 MED ORDER — ENOXAPARIN SODIUM 40 MG/0.4ML ~~LOC~~ SOLN
40.0000 mg | SUBCUTANEOUS | Status: DC
  Administered 2014-04-27: 40 mg via SUBCUTANEOUS
  Filled 2014-04-27 (×2): qty 0.4

## 2014-04-27 MED ORDER — NITROGLYCERIN 0.4 MG SL SUBL: 0.4000 mg | SUBLINGUAL_TABLET | SUBLINGUAL | Status: DC | PRN

## 2014-04-27 NOTE — Progress Notes (Addendum)
Received report from [place] from [facility] Awaiting arrival of pt to unit.  Pt arrived to 4N08 at [place] by [facility]. Pt slid to bed himself. Alert and oriented to self, place, and situation. Unsure of date. Bed alarm on, call bell in place. Will continue to monitor.

## 2014-04-27 NOTE — ED Provider Notes (Signed)
Medical screening examination/treatment/procedure(s) were conducted as a shared visit with non-physician practitioner(s) and myself.  I personally evaluated the patient during the encounter  Please see my separate respective documentation pertaining to this patient encounter   Johnna Acosta, MD 04/27/14 [phone]

## 2014-04-27 NOTE — H&P (Signed)
[facility] History and Physical  Bradley Morgan GHW:[id number] DOB: Aug 11, 1951 DOA: 04/26/2014  Referring physician:  Noemi Chapel PCP:  Woody Seller, MD   Chief Complaint:  Slurred speech, left sided weakness, confusion  HPI:  The patient is a 63 y.o. year-old male with history of HTN, HLD, RLS, chronic pain, gastric ulcers who presents with falls, left weakness, lethargy.  History obtained primarily from family due to patient somnolence.  The patient has severe chronic pain for which he takes numerous pain medications.  He does not take his medications as prescribed, hides his pills and refuses to have his wife supervise his medications.  Over the last week, he has become increasingly somnolent, confused, slurred in speech.  He has fallen "countless" times this week and since then, he has been walking hunched over and having problems holding objects in his right hand.  His wife states she is worried that he will fall asleep and not wake up.  She found him asleep on the kitchen floor this week with the refrigerator door wide open.  He has baseline weakness with dorsiflexion of the right foot since back surgery, but has been c/o right hand weakness and left face and left foot numbness this week which are new.    In ER, VSS, labs stable.  UDS positive for benzos (expected) and opiates (not on med list and patient denies).  Alcohol level < 11.  Symptoms got somewhat better with narcan.  Head CT unremarkable.  Case discussed with neurology who recommended screening MRI. If positive, neurology consult and further testing.  I had to sternal rub him to get him to arouse for exam and it took him 15 minutes to wake up enough to answer a few questions and complete exam.  Took significant prompting to accomplish that much.      Review of Systems:  Unable to obtain due to patient somnolence.   Past Medical History  Diagnosis Date  . Hypertension   . Hyperlipemia   . Chronic pain     neck;lower  back  . Multiple gastric ulcers    Past Surgical History  Procedure Laterality Date  . Neck surgery    . Total knee arthroplasty Left   . Total hip arthroplasty Left 2002  . Joint replacement    . Back surgery     Social History:  reports that he quit smoking about 6 months ago. His smoking use included Cigarettes. He smoked 0.00 packs per day. He does not have any smokeless tobacco history on file. He reports that he drinks alcohol. He reports that he does not use illicit drugs.  Allergies  Allergen Reactions  . Morphine And Related Shortness Of Breath  . Aspirin     No high doses    Family History  Problem Relation Age of Onset  . Heart failure Father   . Diabetes Father   . Hypertension Father      Prior to Admission medications   Medication Sig Start Date End Date Taking? Authorizing Provider  clonazePAM (KLONOPIN) 0.5 MG tablet Take 0.5 mg by mouth 2 (two) times daily as needed for anxiety.   Yes Historical Provider, MD  cyclobenzaprine (FLEXERIL) 10 MG tablet Take 10 mg by mouth 3 (three) times daily as needed for muscle spasms.   Yes Historical Provider, MD  dimenhyDRINATE (DRAMAMINE) 50 MG tablet Take 50 mg by mouth every 8 (eight) hours as needed for nausea or dizziness.   Yes Historical Provider, MD  gabapentin (NEURONTIN)  300 MG capsule Take 300 mg by mouth 3 (three) times daily.   Yes Historical Provider, MD  ketorolac (TORADOL) 10 MG tablet Take 10 mg by mouth every 6 (six) hours as needed for moderate pain.   Yes Historical Provider, MD  lisinopril (PRINIVIL,ZESTRIL) 10 MG tablet Take 10 mg by mouth daily.   Yes Historical Provider, MD  rOPINIRole (REQUIP) 1 MG tablet Take 1 mg by mouth 2 (two) times daily.    Yes Historical Provider, MD  simvastatin (ZOCOR) 40 MG tablet Take 40 mg by mouth at bedtime.   Yes Historical Provider, MD  tetrahydrozoline 0.05 % ophthalmic solution Place 1 drop into both eyes 3 (three) times daily as needed (dry eyes).   Yes Historical  Provider, MD  nitroGLYCERIN (NITROSTAT) 0.4 MG SL tablet Place 0.4 mg under the tongue every 5 (five) minutes as needed for chest pain.    Historical Provider, MD   Physical Exam: Filed Vitals:   04/26/14 2202 04/27/14 0036  BP: 153/86 142/97  Pulse: 101 85  Temp: 99 F (37.2 C) 98.1 F (36.7 C)  TempSrc:  Oral  Resp: 20 18  Height: 6\' 1"  (1.854 m)   Weight: 115.667 kg (255 lb)   SpO2: 95% 97%     General:  CM, snoring and difficult to arouse, NAD  Eyes:  PERRL, anicteric, non-injected.  ENT:  Nares clear.  OP clear, non-erythematous without plaques or exudates.  MMM.  Graham cracker partially chewed in mouth while asleep  Neck:  Supple without TM or JVD.    Lymph:  No cervical, supraclavicular, or submandibular LAD.  Cardiovascular:  RRR, normal S1, S2, without m/r/g.  2+ pulses, warm extremities  Respiratory:  CTA bilaterally without increased WOB.  Abdomen:  NABS.  Soft, ND/NT.    Skin:  Appears to have linear burn marks on inner left knee  Musculoskeletal:  Normal bulk and tone.  No LE edema.  Psychiatric:  Asleep and difficult to arouse Neurologic:  Face symmetric, but numb over lower third of left face.  4/5 strength right hand and foot drop right foot.  Difficulty with hip flexion of left leg secondary to pain.  Decreased sensation over left foot.  No dysmetria hand, left leg heel to shin unable to assess secondary to pain but right appears intact.   Labs on Admission:  Basic Metabolic Panel:  Recent Labs Lab 04/26/14 2236 04/26/14 2248  NA 142 141  K 4.5 4.4  CL 102 103  CO2 28  --   GLUCOSE 104* 100*  BUN 15 15  CREATININE 1.12 1.20  CALCIUM 9.7  --    Liver Function Tests:  Recent Labs Lab 04/26/14 2236  AST 30  ALT 18  ALKPHOS 87  BILITOT 0.4  PROT 6.7  ALBUMIN 3.7   No results found for this basename: LIPASE, AMYLASE,  in the last 168 hours No results found for this basename: AMMONIA,  in the last 168 hours CBC:  Recent Labs Lab  04/26/14 2236 04/26/14 2248  WBC 5.0  --   NEUTROABS 2.9  --   HGB 12.8* 13.9  HCT 40.4 41.0  MCV 93.7  --   PLT 173  --    Cardiac Enzymes: No results found for this basename: CKTOTAL, CKMB, CKMBINDEX, TROPONINI,  in the last 168 hours  BNP (last 3 results)  Recent Labs  12/16/13 1658  PROBNP 109.5   CBG: No results found for this basename: GLUCAP,  in the last 168 hours  Radiological Exams on Admission: Ct Head Wo Contrast  04/27/2014   CLINICAL DATA:  Recent falling episodes, general weakness left greater than right, increased somnolence, left facial numbness  EXAM: CT HEAD WITHOUT CONTRAST  TECHNIQUE: Contiguous axial images were obtained from the base of the skull through the vertex without intravenous contrast.  COMPARISON:  None.  FINDINGS: Mild diffuse age-related atrophy. No evidence of mass or infarct. No hemorrhage or extra-axial fluid. Calvarium is intact. No significant inflammatory change in the paranasal sinuses. There is a radiodense 9 mm lesion in the left scalp posteriorly which appears contiguous with bone, although it is less dense than bone.  IMPRESSION: No acute intracranial findings. Incidental note of a a 9 mm mass in the left posterior scalp likely not of acute significance.   Electronically Signed   By: Skipper Cliche M.D.   On: 04/27/2014 00:27    EKG: Independently reviewed.  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  Assessment/Plan Active Problems:   * No active hospital problems. *  ---  Right hand weakness and left face and foot numbness, somnolence, and slurred speech most likely due to sedating medications, but rule out stroke.  Patient with risk factors of HTN, HLD, obesity, and former smoker. -  Telemetry and neuro checks -  RN stroke swallow -  MRI brain  -  Minimize sedating medications, including dramamine, flexeril, requip -  D/c clonazepam and start CIWA with prn ativan for signs of withdrawal -   Continue gabapentin, but hold next dose until morning -  Needs pain clinic follow up.  At high risk of fatal unintentional overdose.  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  Patient almost obtunded and still c/o severe pain wanting more pain medication.   -  Start with spine X-rays -  Reexamine once not so sedated and consider MRI of painful spine areas -  No additional sedating pain medications until reassessed.    Diet:  Healthy heart Access:  PIV IVF:  yes Proph:  lovenox  Code Status: full Family Communication: patient and family Disposition Plan: Admit to telemetry  Time spent: 60 min Janece Canterbury [facility] Pager [phone]  If 7PM-7AM, please contact night-coverage [email or web address] Password [facility] 04/27/2014, 12:54 AM

## 2014-04-28 DIAGNOSIS — G9340 Encephalopathy, unspecified: Secondary | ICD-10-CM | POA: Diagnosis present

## 2014-04-28 DIAGNOSIS — T50901A Poisoning by unspecified drugs, medicaments and biological substances, accidental (unintentional), initial encounter: Principal | ICD-10-CM

## 2014-04-28 NOTE — Discharge Summary (Signed)
Physician Discharge Summary  Bradley Morgan BJS:[id number] DOB: [birth year] DOA: 04/26/2014  PCP: Bradley Seller, MD  Admit date: 04/26/2014 Discharge date: 04/28/2014  Time spent: 35 minutes  Recommendations for Outpatient Follow-up:  1. Follow up with PCP in 2-4 weeks. 2. Will consider referring to pain clinic as an outpatinet  Discharge Diagnoses:  Active Problems:   Numbness and tingling of left side of face   Weakness of right hand   Drug overdose   Discharge Condition: stable  Diet recommendation: heart healthy  Filed Weights   04/26/14 2202 04/27/14 0218 04/27/14 0500  Weight: 115.667 kg (255 lb) 105.3 kg (232 lb 2.3 oz) 107.911 kg (237 lb 14.4 oz)    History of present illness:  63 y.o. year-old male with history of HTN, HLD, RLS, chronic pain, gastric ulcers who presents with falls, left weakness, lethargy. History obtained primarily from family due to patient somnolence. The patient has severe chronic pain for which he takes numerous pain medications. He does not take his medications as prescribed, hides his pills and refuses to have his wife supervise his medications. Over the last week, he has become increasingly somnolent, confused, slurred in speech. He has fallen "countless" times this week and since then, he has been walking hunched over and having problems holding objects in his right hand. His wife states she is worried that he will fall asleep and not wake up   Hospital Course:  Numbness and tingling of left side of face/  Acute encephalopathy due to Drug overdose - held narcotics on admission. Pt was able to protect airways. - MRI brain showed no CVA. - UDS positive for BZD and opiates, pt also taking flexeril at home. - have instructed wife she needs to take control of this medications and follow up at the pain clinic. - pt has had previous neck surgeries.  Procedures:  MRI  Consultations:  none  Discharge Exam: Filed Vitals:   04/28/14 0538  BP:  140/71  Pulse: 82  Temp: 98 F (36.7 C)  Resp: 18    General: A&O x3 Cardiovascular: RRR Respiratory: good air movement CTA B/L  Discharge Instructions You were cared for by a hospitalist during your hospital stay. If you have any questions about your discharge medications or the care you received while you were in the hospital after you are discharged, you can call the unit and asked to speak with the hospitalist on call if the hospitalist that took care of you is not available. Once you are discharged, your primary care physician will handle any further medical issues. Please note that NO REFILLS for any discharge medications will be authorized once you are discharged, as it is imperative that you return to your primary care physician (or establish a relationship with a primary care physician if you do not have one) for your aftercare needs so that they can reassess your need for medications and monitor your lab values.      Discharge Instructions   Diet - low sodium heart healthy    Complete by:  As directed      Increase activity slowly    Complete by:  As directed             Medication List         clonazePAM 0.5 MG tablet  Commonly known as:  KLONOPIN  Take 0.5 mg by mouth 2 (two) times daily as needed for anxiety.     cyclobenzaprine 10 MG tablet  Commonly known as:  FLEXERIL  Take 10 mg by mouth 3 (three) times daily as needed for muscle spasms.     dimenhyDRINATE 50 MG tablet  Commonly known as:  DRAMAMINE  Take 50 mg by mouth every 8 (eight) hours as needed for nausea or dizziness.     gabapentin 300 MG capsule  Commonly known as:  NEURONTIN  Take 300 mg by mouth 3 (three) times daily.     ketorolac 10 MG tablet  Commonly known as:  TORADOL  Take 10 mg by mouth every 6 (six) hours as needed for moderate pain.     lisinopril 10 MG tablet  Commonly known as:  PRINIVIL,ZESTRIL  Take 10 mg by mouth daily.     nitroGLYCERIN 0.4 MG SL tablet  Commonly known  as:  NITROSTAT  Place 0.4 mg under the tongue every 5 (five) minutes as needed for chest pain.     rOPINIRole 1 MG tablet  Commonly known as:  REQUIP  Take 1 mg by mouth 2 (two) times daily.     simvastatin 40 MG tablet  Commonly known as:  ZOCOR  Take 40 mg by mouth at bedtime.     tetrahydrozoline 0.05 % ophthalmic solution  Place 1 drop into both eyes 3 (three) times daily as needed (dry eyes).       Allergies  Allergen Reactions  . Morphine And Related Shortness Of Breath  . Aspirin     No high doses   Follow-up Information   Follow up with Bradley Seller, MD In 3 weeks. (hospital follow up)    Specialty:  Family Medicine   Contact information:   [address] [place]  [address]        The results of significant diagnostics from this hospitalization (including imaging, microbiology, ancillary and laboratory) are listed below for reference.    Significant Diagnostic Studies: Ct Head Wo Contrast  04/27/2014   CLINICAL DATA:  Recent falling episodes, general weakness left greater than right, increased somnolence, left facial numbness  EXAM: CT HEAD WITHOUT CONTRAST  TECHNIQUE: Contiguous axial images were obtained from the base of the skull through the vertex without intravenous contrast.  COMPARISON:  None.  FINDINGS: Mild diffuse age-related atrophy. No evidence of mass or infarct. No hemorrhage or extra-axial fluid. Calvarium is intact. No significant inflammatory change in the paranasal sinuses. There is a radiodense 9 mm lesion in the left scalp posteriorly which appears contiguous with bone, although it is less dense than bone.  IMPRESSION: No acute intracranial findings. Incidental note of a a 9 mm mass in the left posterior scalp likely not of acute significance.   Electronically Signed   By: Skipper Cliche M.D.   On: 04/27/2014 00:27   [name]  04/27/2014   CLINICAL DATA:  63 year old male with lower extremity weakness, lethargy, confusion,  slurred speech Initial encounter.  EXAM: MRI HEAD WITHOUT CONTRAST  TECHNIQUE: Multiplanar, multiecho pulse sequences of the brain and surrounding structures were obtained without intravenous contrast.  COMPARISON:  Head CT without contrast 04/26/2014. Cervical spine MRI 11/25/2010.  FINDINGS: Mild susceptibility artifact in the upper cervical spine compatible with previous surgery. Otherwise negative visualized cervical spine.  Cerebral volume is within normal limits for age. No restricted diffusion to suggest acute infarction. No midline shift, mass effect, evidence of mass lesion, ventriculomegaly, extra-axial collection or acute intracranial hemorrhage. Cervicomedullary junction and pituitary are within normal limits. Major intracranial vascular flow voids are preserved.  Pearline Cables and white matter signal  is within normal limits for age throughout the brain.  Visible internal auditory structures appear normal. Visualized paranasal sinuses and mastoids are clear. Visualized orbit soft tissues are within normal limits. Visualized scalp soft tissues are within normal limits.  IMPRESSION: Negative non contrast MRI appearance of the brain.   Electronically Signed   By: Lars Pinks M.D.   On: 04/27/2014 17:24    Microbiology: No results found for this or any previous visit (from the past 240 hour(s)).   Labs: Basic Metabolic Panel:  Recent Labs Lab 04/26/14 2236 04/26/14 2248  NA 142 141  K 4.5 4.4  CL 102 103  CO2 28  --   GLUCOSE 104* 100*  BUN 15 15  CREATININE 1.12 1.20  CALCIUM 9.7  --    Liver Function Tests:  Recent Labs Lab 04/26/14 2236  AST 30  ALT 18  ALKPHOS 87  BILITOT 0.4  PROT 6.7  ALBUMIN 3.7   No results found for this basename: LIPASE, AMYLASE,  in the last 168 hours No results found for this basename: AMMONIA,  in the last 168 hours CBC:  Recent Labs Lab 04/26/14 2236 04/26/14 2248  WBC 5.0  --   NEUTROABS 2.9  --   HGB 12.8* 13.9  HCT 40.4 41.0  MCV 93.7  --    PLT 173  --    Cardiac Enzymes: No results found for this basename: CKTOTAL, CKMB, CKMBINDEX, TROPONINI,  in the last 168 hours BNP: BNP (last 3 results)  Recent Labs  12/16/13 1658  PROBNP 109.5   CBG: No results found for this basename: GLUCAP,  in the last 168 hours   Signed:  Charlynne Cousins  [facility] 04/28/2014, 9:04 AM

## 2014-04-28 NOTE — Evaluation (Signed)
Physical Therapy Evaluation Patient Details Name: Bradley Morgan MRN: [id number] DOB: 07-11-1951 Today's Date: 04/28/2014   History of Present Illness  Patient is a 63 yo male admitted 04/26/14 with slurred speech, Lt-sided weakness, and confusion.  MRI was negative for stroke. ? drug overdose per MD note.  PMH:  ETOH abuse, HTN, chronic pain, back surgery, knee surgery.  Clinical Impression  Patient presents with problems listed below.  Per patient and wife, patient at baseline functional mobility, with decreased balance.  Patient has had multiple falls.  Recommended patient use RW at all times to avoid falls.  Recommended OP PT for balance therapy - patient declined.  Patient to d/c home today.    Follow Up Recommendations Outpatient PT;Supervision/Assistance - 24 hour (Patient declined any f/u PT)    Equipment Recommendations  None recommended by PT    Recommendations for Other Services       Precautions / Restrictions Precautions Precautions: Fall Precaution Comments: Patient and wife report patient "falls all the time" Restrictions Weight Bearing Restrictions: No      Mobility  Bed Mobility Overal bed mobility: Independent                Transfers Overall transfer level: Needs assistance Equipment used: None Transfers: Sit to/from Stand Sit to Stand: Supervision         General transfer comment: Supervision for safety only  Ambulation/Gait Ambulation/Gait assistance: Min assist Ambulation Distance (Feet): 120 Feet Assistive device: None Gait Pattern/deviations: Step-through pattern;Decreased stride length;Ataxic;Staggering left;Staggering right;Wide base of support Gait velocity: Decreased Gait velocity interpretation: Below normal speed for age/gender General Gait Details: Patient with unsteady gait - reports this is baseline.  Patient staggers to both sides.  Loses balance with any high level activity such as turning head.  Requires assist to prevent fall.   Instructed patient (and wife) to use RW at all times during gait to try to avoid falls.  Stairs            Wheelchair Mobility    Modified Rankin (Stroke Patients Only) Modified Rankin (Stroke Patients Only) Pre-Morbid Rankin Score: Moderate disability Modified Rankin: Moderately severe disability     Balance Overall balance assessment: Needs assistance         Standing balance support: No upper extremity supported;During functional activity Standing balance-Leahy Scale: Fair Standing balance comment: Patient with decreased balance with any dynamic activity.             High level balance activites: Turns;Sudden stops;Head turns High Level Balance Comments: Loss of balance, requiring assist to prevent falls with any high level balance activity.             Pertinent Vitals/Pain Pain Assessment: No/denies pain    Home Living Family/patient expects to be discharged to:: Private residence Living Arrangements: Spouse/significant other Available Help at Discharge: Family;Available 24 hours/day Type of Home: House Home Access: Level entry     Home Layout: One level Home Equipment: Walker - 2 wheels;Cane - single point      Prior Function Level of Independence: Independent         Comments: Reports he has been instructed to use cane at all times by MD.  Patient does not use cane.     Hand Dominance        Extremity/Trunk Assessment   Upper Extremity Assessment: Overall WFL for tasks assessed           Lower Extremity Assessment: Overall WFL for tasks assessed (Rt DF/PF weak from  prior back issue/surgery)         Communication   Communication: Expressive difficulties  Cognition Arousal/Alertness: Awake/alert Behavior During Therapy: Impulsive Overall Cognitive Status: History of cognitive impairments - at baseline                      General Comments      Exercises        Assessment/Plan    PT Assessment All  further PT needs can be met in the next venue of care  PT Diagnosis Difficulty walking;Abnormality of gait;Altered mental status   PT Problem List Decreased balance;Decreased mobility;Decreased cognition;Decreased safety awareness;Decreased knowledge of precautions  PT Treatment Interventions     PT Goals (Current goals can be found in the Care Plan section) Acute Rehab PT Goals Patient Stated Goal: To go home    Frequency     Barriers to discharge        Co-evaluation               End of Session Equipment Utilized During Treatment: Gait belt Activity Tolerance: Patient tolerated treatment well Patient left: in bed;with call bell/phone within reach;with family/visitor present (sitting EOB; SLP in room) Nurse Communication: Mobility status         Time: 1046-1101 PT Time Calculation (min): 15 min   Charges:   PT Evaluation $Initial PT Evaluation Tier I: 1 Procedure PT Treatments $Gait Training: 8-22 mins   PT G CodesDespina Pole 04/28/2014, 5:24 PM Carita Pian. Sanjuana Kava, [place] Pager [phone]

## 2014-04-28 NOTE — Evaluation (Signed)
Speech Language Pathology Evaluation Patient Details Name: Bradley Morgan MRN: [id number] DOB: 1950-10-07 Today's Date: 04/28/2014 Time: 7544-9201 SLP Time Calculation (min): 16 min  Problem List:  Patient Active Problem List   Diagnosis Date Noted  . Acute encephalopathy 04/28/2014  . Numbness and tingling of left side of face 04/27/2014  . Weakness of right hand 04/27/2014  . Drug overdose 04/27/2014  . Obesity, unspecified 12/17/2013  . Essential hypertension, benign 12/17/2013  . Pure hypercholesterolemia 12/17/2013  . Chest pain 12/16/2013   Past Medical History:  Past Medical History  Diagnosis Date  . Hypertension   . Hyperlipemia   . Chronic pain     neck;lower back  . Multiple gastric ulcers    Past Surgical History:  Past Surgical History  Procedure Laterality Date  . Neck surgery      3 total  . Total knee arthroplasty Left   . Total hip arthroplasty Left 2002  . Joint replacement    . Back surgery      5 back  . Rotator cuff repair Bilateral    HPI:  63 y.o. year-old male with history of HTN, HLD, RLS, chronic pain, gastric ulcers who presents with falls, left weakness, lethargy. Diagnosed with acute encephalopathy due to drug overdose (UDS positive for BZD and opiates).    Assessment / Plan / Recommendation Clinical Impression  Cognitive-linguistic evaluation complete. Patient with obvious impairements in sustained attention (easily distracted), judgement, reasoning, and memory however per patient and spouse who was present with evaluation, this is baseline for patient. All acute changes in mentation have subsided per wife. Educated patient and wife on need for close supervision with high risk activities such as medication management, cooking, etc in order to maximize safety due to deficits noted above. Wife verbalized understanding. No f/u SLP needs indicated at this time given chronic nature of cognitive impairements.     SLP Assessment  Patient does not  need any further Speech Lanaguage Pathology Services    Follow Up Recommendations  None       Pertinent Vitals/Pain Pain Assessment: No/denies pain       SLP Evaluation Prior Functioning  Cognitive/Linguistic Baseline: Baseline deficits Baseline deficit details: see clinical impression statement  Lives With: Spouse   Cognition  Overall Cognitive Status: History of cognitive impairments - at baseline    Comprehension  Auditory Comprehension Overall Auditory Comprehension: Appears within functional limits for tasks assessed Visual Recognition/Discrimination Discrimination: Not tested Reading Comprehension Reading Status: Not tested    Expression Expression Primary Mode of Expression: Verbal Verbal Expression Overall Verbal Expression: Appears within functional limits for tasks assessed   Oral / Motor Motor Speech Overall Motor Speech: Other (comment) (mumbles)   [name], CCC-SLP [phone]  [name] [name] 04/28/2014, 11:09 AM

## 2014-04-29 NOTE — Progress Notes (Signed)
UR complete.  Galen Malkowski RN, MSN 

## 2014-04-29 NOTE — Care Management Note (Addendum)
  Page 1 of 1   04/29/2014     10:32:57 AM CARE MANAGEMENT NOTE 04/29/2014  Patient:  Bradley Morgan, Bradley Morgan   Account Number:  [id number]  Date Initiated:  04/29/2014  Documentation initiated by:  Lorne Skeens  Subjective/Objective Assessment:   Patient was admitted with  Numbness and tingling of left side of face    Weakness of right hand    Drug overdose. Lives at home with wife     Action/Plan:   Will follow for discharge needs pending PT/OT evals and physician orders.   Anticipated DC Date:     Anticipated DC Plan:  HOME/SELF CARE         Choice offered to / List presented to:             Status of service:  Completed, signed off Medicare Important Message given?  NA - LOS <3 / Initial given by admissions (If response is "NO", the following Medicare IM given date fields will be blank) Date Medicare IM given:   Medicare IM given by:   Date Additional Medicare IM given:   Additional Medicare IM given by:    Discharge Disposition:  HOME/SELF CARE  Per UR Regulation:  Reviewed for med. necessity/level of care/duration of stay  If discussed at [place] of Stay Meetings, dates discussed:    Comments:

## 2014-06-27 ENCOUNTER — Other Ambulatory Visit: Payer: Self-pay | Admitting: Surgical

## 2014-07-02 ENCOUNTER — Encounter (HOSPITAL_COMMUNITY): Payer: Self-pay | Admitting: Pharmacy Technician

## 2014-07-03 ENCOUNTER — Other Ambulatory Visit (HOSPITAL_COMMUNITY): Payer: Self-pay | Admitting: *Deleted

## 2014-07-03 NOTE — Patient Instructions (Addendum)
Bradley Morgan  07/03/2014                           YOUR PROCEDURE IS SCHEDULED ON: 07/11/14                ENTER FROM [address] - GO TO PARKING DECK               LOOK FOR [employer]ING  / [employer]                              FOLLOW  SIGNS TO SHORT STAY CENTER                 ARRIVE AT SHORT STAY AT:  11:00 AM               CALL THIS NUMBER IF ANY PROBLEMS THE DAY OF SURGERY :               [phone]                                REMEMBER:   Do not eat food  AFTER MIDNIGHT               MAY HAVE CLEAR LIQUIDS UNTIL 7:00 AM     CLEAR LIQUID DIET   Foods Allowed                                                                     Foods Excluded  Coffee and tea, regular and decaf                             liquids that you cannot  Plain Jell-O in any flavor                                             see through such as: Fruit ices (not with fruit pulp)                                     milk, soups, orange juice  Iced Popsicles                                    All solid food Carbonated beverages, regular and diet                                    Cranberry, grape and apple juices Sports drinks like Gatorade Lightly seasoned clear broth or consume(fat free) Sugar, honey syrup                    Take these medicines the morning of surgery with  A SIPS OF WATER :     PROTONIX / CLARITIN / GABAPENTIN / FLEXERIL / HYDROCODONE / MAY TAKE CLONAZEPAM IF NEEDED    Do not wear jewelry, make-up   Do not wear lotions, powders, or perfumes.   Do not shave legs or underarms 12 hrs. before surgery (men may shave face)  Do not bring valuables to the hospital.  Contacts, dentures or bridgework may not be worn into surgery.  Leave suitcase in the car. After surgery it may be brought to your room.  For patients admitted to the hospital more than one night, checkout time is            11:00 AM                                                          ________________________________________________________________________                                                                                                  [place]  Before surgery, you can play an important role.  Because skin is not sterile, your skin needs to be as free of germs as possible.  You can reduce the number of germs on your skin by washing with CHG (chlorahexidine gluconate) soap before surgery.  CHG is an antiseptic cleaner which kills germs and bonds with the skin to continue killing germs even after washing. Please DO NOT use if you have an allergy to CHG or antibacterial soaps.  If your skin becomes reddened/irritated stop using the CHG and inform your nurse when you arrive at Short Stay. Do not shave (including legs and underarms) for at least 48 hours prior to the first CHG shower.  You may shave your face. Please follow these instructions carefully:   1.  Shower with CHG Soap the night before surgery and the  morning of Surgery.   2.  If you choose to wash your hair, wash your hair first as usual with your  normal  Shampoo.   3.  After you shampoo, rinse your hair and body thoroughly to remove the  shampoo.                                         4.  Use CHG as you would any other liquid soap.  You can apply chg directly  to the skin and wash . Gently wash with scrungie or clean wascloth    5.  Apply the CHG Soap to your body ONLY FROM THE NECK DOWN.   Do not use on open                           Wound or open sores. Avoid contact with eyes, ears mouth and  genitals (private parts).                        Genitals (private parts) with your normal soap.              6.  Wash thoroughly, paying special attention to the area where your surgery  will be performed.   7.  Thoroughly rinse your body with warm water from the neck down.   8.  DO NOT shower/wash with your normal soap after using and rinsing off  the CHG Soap .                 9.  Pat yourself dry with a clean towel.             10.  Wear clean pajamas.             11.  Place clean sheets on your bed the night of your first shower and do not  sleep with pets.  Day of Surgery : Do not apply any lotions/deodorants the morning of surgery.  Please wear clean clothes to the hospital/surgery center.  FAILURE TO FOLLOW THESE INSTRUCTIONS MAY RESULT IN THE CANCELLATION OF YOUR SURGERY    PATIENT SIGNATURE_________________________________  ______________________________________________________________________     [name]  An incentive spirometer is a tool that can help keep your lungs clear and active. This tool measures how well you are filling your lungs with each breath. Taking long deep breaths may help reverse or decrease the chance of developing breathing (pulmonary) problems (especially infection) following:  A long period of time when you are unable to move or be active. BEFORE THE PROCEDURE   If the spirometer includes an indicator to show your best effort, your nurse or respiratory therapist will set it to a desired goal.  If possible, sit up straight or lean slightly forward. Try not to slouch.  Hold the incentive spirometer in an upright position. INSTRUCTIONS FOR USE  1. Sit on the edge of your bed if possible, or sit up as far as you can in bed or on a chair. 2. Hold the incentive spirometer in an upright position. 3. Breathe out normally. 4. Place the mouthpiece in your mouth and seal your lips tightly around it. 5. Breathe in slowly and as deeply as possible, raising the piston or the ball toward the top of the column. 6. Hold your breath for 3-5 seconds or for as long as possible. Allow the piston or ball to fall to the bottom of the column. 7. Remove the mouthpiece from your mouth and breathe out normally. 8. Rest for a few seconds and repeat Steps 1 through 7 at least 10 times every 1-2 hours when you are awake.  Take your time and take a few normal breaths between deep breaths. 9. The spirometer may include an indicator to show your best effort. Use the indicator as a goal to work toward during each repetition. 10. After each set of 10 deep breaths, practice coughing to be sure your lungs are clear. If you have an incision (the cut made at the time of surgery), support your incision when coughing by placing a pillow or rolled up towels firmly against it. Once you are able to get out of bed, walk around indoors and cough well. You may stop using the incentive spirometer when instructed by your caregiver.  RISKS AND COMPLICATIONS  Take your time so you do not get dizzy or light-headed.  If you are in pain, you may need to take or ask for pain medication before doing incentive spirometry. It is harder to take a deep breath if you are having pain. AFTER USE  Rest and breathe slowly and easily.  It can be helpful to keep track of a log of your progress. Your caregiver can provide you with a simple table to help with this. If you are using the spirometer at home, follow these instructions: [place] IF:   You are having difficultly using the spirometer.  You have trouble using the spirometer as often as instructed.  Your pain medication is not giving enough relief while using the spirometer.  You develop fever of 100.5 F (38.1 C) or higher. SEEK IMMEDIATE MEDICAL CARE IF:   You cough up bloody sputum that had not been present before.  You develop fever of 102 F (38.9 C) or greater.  You develop worsening pain at or near the incision site. MAKE SURE YOU:   Understand these instructions.  Will watch your condition.  Will get help right away if you are not doing well or get worse. Document Released: 01/17/2007 Document Revised: 11/29/2011 Document Reviewed: 03/20/2007 [facility] Patient Information 2014 [place],  [place].   ________________________________________________________________________

## 2014-07-05 ENCOUNTER — Encounter (HOSPITAL_COMMUNITY)
Admission: RE | Admit: 2014-07-05 | Discharge: 2014-07-05 | Disposition: A | Discharge status: Home / Self Care | Payer: Medicare Other | Source: Ambulatory Visit | Attending: Orthopedic Surgery | Admitting: Orthopedic Surgery

## 2014-07-05 ENCOUNTER — Encounter (HOSPITAL_COMMUNITY): Payer: Self-pay

## 2014-07-05 ENCOUNTER — Encounter (INDEPENDENT_AMBULATORY_CARE_PROVIDER_SITE_OTHER): Payer: Self-pay

## 2014-07-05 DIAGNOSIS — Z01812 Encounter for preprocedural laboratory examination: Secondary | ICD-10-CM | POA: Diagnosis present

## 2014-07-05 HISTORY — DX: Adverse effect of unspecified anesthetic, initial encounter: T41.45XA

## 2014-07-05 HISTORY — DX: Other specified postprocedural states: R11.2

## 2014-07-05 HISTORY — DX: Other specified postprocedural states: Z98.890

## 2014-07-05 HISTORY — DX: Restless legs syndrome: G25.81

## 2014-07-05 HISTORY — DX: Unspecified osteoarthritis, unspecified site: M19.90

## 2014-07-05 HISTORY — DX: Family history of other specified conditions: Z84.89

## 2014-07-05 HISTORY — DX: Other complications of anesthesia, initial encounter: T88.59XA

## 2014-07-05 HISTORY — DX: Gastro-esophageal reflux disease without esophagitis: K21.9

## 2014-07-05 LAB — CBC
HCT: 40.9 % (ref 39.0–52.0)
HEMOGLOBIN: 13.3 g/dL (ref 13.0–17.0)
MCH: 29.9 pg (ref 26.0–34.0)
MCHC: 32.5 g/dL (ref 30.0–36.0)
MCV: 91.9 fL (ref 78.0–100.0)
PLATELETS: 172 10*3/uL (ref 150–400)
RBC: 4.45 MIL/uL (ref 4.22–5.81)
RDW: 13.4 % (ref 11.5–15.5)
WBC: 5.8 10*3/uL (ref 4.0–10.5)

## 2014-07-05 LAB — URINALYSIS, ROUTINE W REFLEX MICROSCOPIC
Bilirubin Urine: NEGATIVE
Glucose, UA: NEGATIVE mg/dL
Hgb urine dipstick: NEGATIVE
Ketones, ur: NEGATIVE mg/dL
Leukocytes, UA: NEGATIVE
Nitrite: NEGATIVE
Protein, ur: NEGATIVE mg/dL
Specific Gravity, Urine: 1.017 (ref 1.005–1.030)
Urobilinogen, UA: 0.2 mg/dL (ref 0.0–1.0)
pH: 6.5 (ref 5.0–8.0)

## 2014-07-05 LAB — PROTIME-INR
INR: 1.01 (ref 0.00–1.49)
Prothrombin Time: 13.4 seconds (ref 11.6–15.2)

## 2014-07-05 LAB — COMPREHENSIVE METABOLIC PANEL
ALT: 13 U/L (ref 0–53)
AST: 17 U/L (ref 0–37)
Albumin: 4 g/dL (ref 3.5–5.2)
Alkaline Phosphatase: 89 U/L (ref 39–117)
Anion gap: 11 (ref 5–15)
BUN: 19 mg/dL (ref 6–23)
CO2: 28 mEq/L (ref 19–32)
Calcium: 9.4 mg/dL (ref 8.4–10.5)
Chloride: 102 mEq/L (ref 96–112)
Creatinine, Ser: 1.06 mg/dL (ref 0.50–1.35)
GFR calc Af Amer: 86 mL/min — ABNORMAL LOW (ref >90)
GFR calc non Af Amer: 74 mL/min — ABNORMAL LOW (ref >90)
Glucose, Bld: 91 mg/dL (ref 70–99)
Potassium: 4.8 mEq/L (ref 3.7–5.3)
Sodium: 141 mEq/L (ref 137–147)
Total Bilirubin: 0.4 mg/dL (ref 0.3–1.2)
Total Protein: 6.9 g/dL (ref 6.0–8.3)

## 2014-07-05 NOTE — Progress Notes (Signed)
07/05/14 1329  OBSTRUCTIVE SLEEP APNEA  Have you ever been diagnosed with sleep apnea through a sleep study? No  Do you snore loudly (loud enough to be heard through closed doors)?  1  Do you often feel tired, fatigued, or sleepy during the daytime? 0  Has anyone observed you stop breathing during your sleep? 1  Do you have, or are you being treated for high blood pressure? 1  BMI more than 35 kg/m2? 0  Age over 61 years old? 1  Neck circumference greater than 40 cm/16 inches? 1  Gender: 1  Obstructive Sleep Apnea Score 6  Score 4 or greater  Results sent to PCP

## 2014-07-05 NOTE — H&P (Signed)
Bradley Morgan is an 63 y.o. male.   Chief Complaint: left shoulder pain HPI: Th patient presented with the chief complaint of left shoulder pain after a fall at home. He fell out of a chair and landed on his left shoulder. He developed pain, decreased ROM, and weakness in the left shoulder. MRI of the left shoulder revealed a tear of the left rotator cuff.   Past Medical History  Diagnosis Date  . Hypertension   . Hyperlipemia   . Chronic pain     neck;lower back  . Multiple gastric ulcers     Past Surgical History  Procedure Laterality Date  . Neck surgery      3 total  . Total knee arthroplasty Left   . Total hip arthroplasty Left 2002  . Joint replacement    . Back surgery      5 back  . Rotator cuff repair Bilateral     Family History  Problem Relation Age of Onset  . Heart failure Father   . Diabetes Father   . Hypertension Father   . Anxiety disorder Mother     also tended to overdose on anxiety medication   Social History:  reports that he quit smoking about 15 years ago. His smoking use included Cigarettes. He smoked 1.00 packs per day. He reports that he does not drink alcohol or use illicit drugs.  Allergies:  Allergies  Allergen Reactions  . Morphine And Related Shortness Of Breath  . Aspirin     No high doses     Current outpatient prescriptions: acetaminophen (TYLENOL) 500 MG tablet, Take 1,000 mg by mouth as needed for mild pain or headache., Disp: , Rfl: ;   clonazePAM (KLONOPIN) 0.5 MG tablet, Take 0.5 mg by mouth 2 (two) times daily as needed for anxiety., Disp: , Rfl: ;   cyclobenzaprine (FLEXERIL) 10 MG tablet, Take 10 mg by mouth 3 (three) times daily as needed for muscle spasms., Disp: , Rfl:  dimenhyDRINATE (DRAMAMINE) 50 MG tablet, Take 50 mg by mouth every 8 (eight) hours as needed for nausea or dizziness., Disp: , Rfl: ;   gabapentin (NEURONTIN) 300 MG capsule, Take 300 mg by mouth 3 (three) times daily., Disp: , Rfl: ;    HYDROcodone-acetaminophen (NORCO) 10-325 MG per tablet, Take 1 tablet by mouth 4 (four) times daily., Disp: , Rfl:  ketorolac (TORADOL) 10 MG tablet, Take 10 mg by mouth every 6 (six) hours as needed for moderate pain., Disp: , Rfl: ;   lisinopril (PRINIVIL,ZESTRIL) 10 MG tablet, Take 10 mg by mouth at bedtime. , Disp: , Rfl: ;   Loratadine-Pseudoephedrine (CLARITIN-D 12 HOUR PO), Take 1 tablet by mouth every morning., Disp: , Rfl:  nitroGLYCERIN (NITROSTAT) 0.4 MG SL tablet, Place 0.4 mg under the tongue every 5 (five) minutes as needed for chest pain., Disp: , Rfl: ;   rOPINIRole (REQUIP) 1 MG tablet, Take 1 mg by mouth 2 (two) times daily. , Disp: , Rfl: ;   simvastatin (ZOCOR) 40 MG tablet, Take 40 mg by mouth at bedtime., Disp: , Rfl: ;   tetrahydrozoline 0.05 % ophthalmic solution, Place 1 drop into both eyes 3 (three) times daily as needed (dry eyes)., Disp: , Rfl:   Review of Systems  Constitutional: Negative.   HENT: Negative.   Eyes: Negative.   Respiratory: Negative.   Cardiovascular: Negative.   Gastrointestinal: Negative.   Genitourinary: Negative.   Musculoskeletal: Positive for back pain, falls and joint pain. Negative for  myalgias and neck pain.       Left shoulder pain  Skin: Negative.   Neurological: Negative.   Endo/Heme/Allergies: Negative.   Psychiatric/Behavioral: Negative.    Vitals Weight: 247 lb Height: 73in Body Surface Area: 2.4 m Body Mass Index: 32.59 kg/m BP: 160/98 (Sitting, Left Arm, Standard) HR: 72 bpm  Physical Exam  Constitutional: He is oriented to person, place, and time. He appears well-developed. No distress.  Obese  HENT:  Head: Normocephalic and atraumatic.  Right Ear: External ear normal.  Left Ear: External ear normal.  Nose: Nose normal.  Mouth/Throat: Oropharynx is clear and moist.  Eyes: Conjunctivae and EOM are normal.  Neck: Normal range of motion. Neck supple.  Cardiovascular: Normal rate, regular rhythm, normal  heart sounds and intact distal pulses.   No murmur heard. Respiratory: Effort normal and breath sounds normal. No respiratory distress. He has no wheezes.  GI: Soft. Bowel sounds are normal. He exhibits no distension. There is no tenderness.  Neurological: He is alert and oriented to person, place, and time. He has normal strength and normal reflexes. No sensory deficit.  Skin: No rash noted. He is not diaphoretic. No erythema.  Psychiatric: He has a normal mood and affect. His behavior is normal.     Assessment/Plan Left shoulder pain Open repair, open acromionectomy and repair of the rotator cuff on the left. Possible graft and possible anchor. Risks and benefits of the surgery discussed. We may or may not need to use a graft material that is made from calf skin. This is approved by the FDA and I have not had a rejection of that graft yet. Also, we may need to use anchors. These are polyethylene anchors that stay in the bone and we use those anchors to suture the tendon down in certain cases where the tendon is completely pulled off the bone. There is always a chance of a secondary infection obviously with any surgery but we do use antibiotics preop.    H&P performed BY Dr. Latanya Maudlin H&P documented by Ardeen Jourdain, PA-C  [place], Cullen Vanallen Ander Purpura 07/05/2014, 8:56 AM

## 2014-07-10 NOTE — Anesthesia Preprocedure Evaluation (Addendum)
Anesthesia Evaluation  Patient identified by MRN, date of birth, ID band Patient awake    Reviewed: Allergy & Precautions, H&P , NPO status , Patient's Chart, lab work & pertinent test results  History of Anesthesia Complications (+) PONV and history of anesthetic complications (denies any family history of problems with anesthesia, reports that he was "aware" during a back surgery)  Airway Mallampati: II TM Distance: >3 FB Neck ROM: Full    Dental  (+) Edentulous Upper, Edentulous Lower   Pulmonary former smoker,  breath sounds clear to auscultation  Pulmonary exam normal       Cardiovascular Exercise Tolerance: Good hypertension, Pt. on medications Rhythm:Regular Rate:Normal     Neuro/Psych Anxiety negative neurological ROS     GI/Hepatic Neg liver ROS, GERD-  Medicated and Controlled,  Endo/Other  negative endocrine ROS  Renal/GU negative Renal ROS  negative genitourinary   Musculoskeletal  (+) Arthritis -, Osteoarthritis,    Abdominal   Peds negative pediatric ROS (+)  Hematology negative hematology ROS (+)   Anesthesia Other Findings   Reproductive/Obstetrics negative OB ROS                          Anesthesia Physical Anesthesia Plan  ASA: II  Anesthesia Plan: General   Post-op Pain Management:    Induction: Intravenous  Airway Management Planned: Oral ETT  Additional Equipment:   Intra-op Plan:   Post-operative Plan: Extubation in OR  Informed Consent: I have reviewed the patients History and Physical, chart, labs and discussed the procedure including the risks, benefits and alternatives for the proposed anesthesia with the patient or authorized representative who has indicated his/her understanding and acceptance.   Dental advisory given  Plan Discussed with: CRNA  Anesthesia Plan Comments:         Anesthesia Quick Evaluation

## 2014-07-11 ENCOUNTER — Encounter (HOSPITAL_COMMUNITY)
Admission: RE | Disposition: A | Discharge status: Home / Self Care | Payer: Self-pay | Source: Ambulatory Visit | Attending: Orthopedic Surgery

## 2014-07-11 ENCOUNTER — Encounter (HOSPITAL_COMMUNITY): Payer: Medicare Other | Admitting: Anesthesiology

## 2014-07-11 ENCOUNTER — Observation Stay (HOSPITAL_COMMUNITY)
Admission: RE | Admit: 2014-07-11 | Discharge: 2014-07-12 | Disposition: A | Discharge status: Home / Self Care | Payer: Medicare Other | Source: Ambulatory Visit | Attending: Orthopedic Surgery | Admitting: Orthopedic Surgery

## 2014-07-11 ENCOUNTER — Observation Stay (HOSPITAL_COMMUNITY): Payer: Medicare Other

## 2014-07-11 ENCOUNTER — Ambulatory Visit (HOSPITAL_COMMUNITY): Payer: Medicare Other | Admitting: Anesthesiology

## 2014-07-11 ENCOUNTER — Encounter (HOSPITAL_COMMUNITY): Payer: Self-pay | Admitting: *Deleted

## 2014-07-11 DIAGNOSIS — G8929 Other chronic pain: Secondary | ICD-10-CM | POA: Diagnosis not present

## 2014-07-11 DIAGNOSIS — M75122 Complete rotator cuff tear or rupture of left shoulder, not specified as traumatic: Principal | ICD-10-CM | POA: Insufficient documentation

## 2014-07-11 DIAGNOSIS — M7542 Impingement syndrome of left shoulder: Secondary | ICD-10-CM | POA: Insufficient documentation

## 2014-07-11 DIAGNOSIS — Z6832 Body mass index (BMI) 32.0-32.9, adult: Secondary | ICD-10-CM | POA: Insufficient documentation

## 2014-07-11 DIAGNOSIS — Y92009 Unspecified place in unspecified non-institutional (private) residence as the place of occurrence of the external cause: Secondary | ICD-10-CM

## 2014-07-11 DIAGNOSIS — E669 Obesity, unspecified: Secondary | ICD-10-CM | POA: Diagnosis not present

## 2014-07-11 DIAGNOSIS — Y9389 Activity, other specified: Secondary | ICD-10-CM | POA: Insufficient documentation

## 2014-07-11 DIAGNOSIS — M545 Low back pain: Secondary | ICD-10-CM | POA: Insufficient documentation

## 2014-07-11 DIAGNOSIS — G2581 Restless legs syndrome: Secondary | ICD-10-CM | POA: Insufficient documentation

## 2014-07-11 DIAGNOSIS — W07XXXA Fall from chair, initial encounter: Secondary | ICD-10-CM | POA: Diagnosis not present

## 2014-07-11 DIAGNOSIS — K219 Gastro-esophageal reflux disease without esophagitis: Secondary | ICD-10-CM | POA: Diagnosis not present

## 2014-07-11 DIAGNOSIS — I10 Essential (primary) hypertension: Secondary | ICD-10-CM | POA: Insufficient documentation

## 2014-07-11 DIAGNOSIS — Y92018 Other place in single-family (private) house as the place of occurrence of the external cause: Secondary | ICD-10-CM | POA: Insufficient documentation

## 2014-07-11 DIAGNOSIS — E785 Hyperlipidemia, unspecified: Secondary | ICD-10-CM | POA: Diagnosis not present

## 2014-07-11 DIAGNOSIS — Y998 Other external cause status: Secondary | ICD-10-CM | POA: Insufficient documentation

## 2014-07-11 DIAGNOSIS — Z885 Allergy status to narcotic agent status: Secondary | ICD-10-CM | POA: Diagnosis not present

## 2014-07-11 DIAGNOSIS — M1990 Unspecified osteoarthritis, unspecified site: Secondary | ICD-10-CM | POA: Diagnosis not present

## 2014-07-11 DIAGNOSIS — F419 Anxiety disorder, unspecified: Secondary | ICD-10-CM | POA: Diagnosis not present

## 2014-07-11 DIAGNOSIS — Z886 Allergy status to analgesic agent status: Secondary | ICD-10-CM | POA: Insufficient documentation

## 2014-07-11 DIAGNOSIS — W19XXXA Unspecified fall, initial encounter: Secondary | ICD-10-CM

## 2014-07-11 DIAGNOSIS — M75120 Complete rotator cuff tear or rupture of unspecified shoulder, not specified as traumatic: Secondary | ICD-10-CM | POA: Diagnosis present

## 2014-07-11 HISTORY — PX: SHOULDER OPEN ROTATOR CUFF REPAIR: SHX2407

## 2014-07-11 SURGERY — REPAIR, ROTATOR CUFF, OPEN
Anesthesia: General | Site: Shoulder | Laterality: Left

## 2014-07-11 MED ORDER — SIMVASTATIN 40 MG PO TABS
40.0000 mg | ORAL_TABLET | Freq: Every day | ORAL | Status: DC
  Administered 2014-07-11: 40 mg via ORAL
  Filled 2014-07-11 (×2): qty 1

## 2014-07-11 MED ORDER — FLEET ENEMA 7-19 GM/118ML RE ENEM: 1.0000 | ENEMA | Freq: Once | RECTAL | Status: AC | PRN

## 2014-07-11 MED ORDER — FENTANYL CITRATE 0.05 MG/ML IJ SOLN
25.0000 ug | INTRAMUSCULAR | Status: DC | PRN
  Administered 2014-07-11 (×2): 50 ug via INTRAVENOUS

## 2014-07-11 MED ORDER — SODIUM CHLORIDE 0.9 % IR SOLN
Status: AC
  Filled 2014-07-11: qty 1

## 2014-07-11 MED ORDER — CEFAZOLIN SODIUM-DEXTROSE 2-3 GM-% IV SOLR
2.0000 g | INTRAVENOUS | Status: AC
  Administered 2014-07-11: 3 g via INTRAVENOUS

## 2014-07-11 MED ORDER — ROCURONIUM BROMIDE 100 MG/10ML IV SOLN
INTRAVENOUS | Status: DC | PRN
  Administered 2014-07-11: 15 mg via INTRAVENOUS
  Administered 2014-07-11: 50 mg via INTRAVENOUS

## 2014-07-11 MED ORDER — PROPOFOL 10 MG/ML IV BOLUS
INTRAVENOUS | Status: AC
  Filled 2014-07-11: qty 20

## 2014-07-11 MED ORDER — PHENOL 1.4 % MT LIQD
1.0000 | OROMUCOSAL | Status: DC | PRN
  Filled 2014-07-11: qty 177

## 2014-07-11 MED ORDER — BUPIVACAINE LIPOSOME 1.3 % IJ SUSP
INTRAMUSCULAR | Status: DC | PRN
  Administered 2014-07-11: 20 mL

## 2014-07-11 MED ORDER — HYDROMORPHONE HCL 2 MG/ML IJ SOLN
INTRAMUSCULAR | Status: AC
  Filled 2014-07-11: qty 1

## 2014-07-11 MED ORDER — ONDANSETRON HCL 4 MG/2ML IJ SOLN
INTRAMUSCULAR | Status: DC | PRN
  Administered 2014-07-11: 4 mg via INTRAVENOUS

## 2014-07-11 MED ORDER — ONDANSETRON HCL 4 MG PO TABS: 4.0000 mg | ORAL_TABLET | Freq: Four times a day (QID) | ORAL | Status: DC | PRN

## 2014-07-11 MED ORDER — LACTATED RINGERS IV SOLN
INTRAVENOUS | Status: DC
  Administered 2014-07-12: 02:00:00 via INTRAVENOUS

## 2014-07-11 MED ORDER — BUPIVACAINE LIPOSOME 1.3 % IJ SUSP
20.0000 mL | Freq: Once | INTRAMUSCULAR | Status: DC
  Filled 2014-07-11: qty 20

## 2014-07-11 MED ORDER — NAPHAZOLINE HCL 0.1 % OP SOLN
1.0000 [drp] | Freq: Four times a day (QID) | OPHTHALMIC | Status: DC | PRN
  Filled 2014-07-11: qty 15

## 2014-07-11 MED ORDER — MIDAZOLAM HCL 2 MG/2ML IJ SOLN
INTRAMUSCULAR | Status: AC
  Filled 2014-07-11: qty 2

## 2014-07-11 MED ORDER — LIDOCAINE HCL (CARDIAC) 20 MG/ML IV SOLN
INTRAVENOUS | Status: DC | PRN
  Administered 2014-07-11: 50 mg via INTRAVENOUS

## 2014-07-11 MED ORDER — PROPOFOL 10 MG/ML IV BOLUS
INTRAVENOUS | Status: DC | PRN
  Administered 2014-07-11: 170 mg via INTRAVENOUS
  Administered 2014-07-11: 10 mg via INTRAVENOUS
  Administered 2014-07-11: 20 mg via INTRAVENOUS

## 2014-07-11 MED ORDER — LACTATED RINGERS IV SOLN
INTRAVENOUS | Status: DC
Start: 2014-07-11 — End: 2014-07-11
  Administered 2014-07-11: 16:00:00 via INTRAVENOUS

## 2014-07-11 MED ORDER — POLYETHYLENE GLYCOL 3350 17 G PO PACK
17.0000 g | PACK | Freq: Every day | ORAL | Status: DC | PRN
  Filled 2014-07-11: qty 1

## 2014-07-11 MED ORDER — HYDROMORPHONE HCL 1 MG/ML IJ SOLN
INTRAMUSCULAR | Status: AC
  Filled 2014-07-11: qty 1

## 2014-07-11 MED ORDER — MIDAZOLAM HCL 5 MG/5ML IJ SOLN
INTRAMUSCULAR | Status: DC | PRN
  Administered 2014-07-11: 2 mg via INTRAVENOUS

## 2014-07-11 MED ORDER — HYDROCODONE-ACETAMINOPHEN 10-325 MG PO TABS
1.0000 | ORAL_TABLET | Freq: Four times a day (QID) | ORAL | Status: DC
  Administered 2014-07-11 – 2014-07-12 (×3): 1 via ORAL
  Filled 2014-07-11 (×3): qty 1

## 2014-07-11 MED ORDER — OXYCODONE-ACETAMINOPHEN 5-325 MG PO TABS: 1.0000 | ORAL_TABLET | ORAL | Status: DC | PRN

## 2014-07-11 MED ORDER — ROCURONIUM BROMIDE 100 MG/10ML IV SOLN
INTRAVENOUS | Status: AC
  Filled 2014-07-11: qty 1

## 2014-07-11 MED ORDER — GLYCOPYRROLATE 0.2 MG/ML IJ SOLN
INTRAMUSCULAR | Status: DC | PRN
  Administered 2014-07-11: 0.4 mg via INTRAVENOUS

## 2014-07-11 MED ORDER — FENTANYL CITRATE 0.05 MG/ML IJ SOLN
INTRAMUSCULAR | Status: DC | PRN
  Administered 2014-07-11: 50 ug via INTRAVENOUS
  Administered 2014-07-11: 100 ug via INTRAVENOUS

## 2014-07-11 MED ORDER — PANTOPRAZOLE SODIUM 20 MG PO TBEC
20.0000 mg | DELAYED_RELEASE_TABLET | Freq: Every day | ORAL | Status: DC
  Administered 2014-07-11 – 2014-07-12 (×2): 20 mg via ORAL
  Filled 2014-07-11 (×2): qty 1

## 2014-07-11 MED ORDER — ONDANSETRON HCL 4 MG/2ML IJ SOLN: 4.0000 mg | Freq: Once | INTRAMUSCULAR | Status: DC | PRN

## 2014-07-11 MED ORDER — FENTANYL CITRATE 0.05 MG/ML IJ SOLN
INTRAMUSCULAR | Status: AC
  Filled 2014-07-11: qty 5

## 2014-07-11 MED ORDER — EPHEDRINE SULFATE 50 MG/ML IJ SOLN
INTRAMUSCULAR | Status: DC | PRN
  Administered 2014-07-11 (×3): 5 mg via INTRAVENOUS

## 2014-07-11 MED ORDER — LACTATED RINGERS IV SOLN
INTRAVENOUS | Status: DC | PRN
  Administered 2014-07-11 (×2): via INTRAVENOUS

## 2014-07-11 MED ORDER — HYDROMORPHONE HCL 1 MG/ML IJ SOLN
0.5000 mg | INTRAMUSCULAR | Status: DC | PRN
  Administered 2014-07-11 (×3): 0.5 mg via INTRAVENOUS
  Administered 2014-07-12 (×3): 1 mg via INTRAVENOUS
  Filled 2014-07-11 (×6): qty 1

## 2014-07-11 MED ORDER — HYDROMORPHONE HCL 1 MG/ML IJ SOLN
0.2500 mg | INTRAMUSCULAR | Status: DC | PRN
  Administered 2014-07-11 (×4): 0.5 mg via INTRAVENOUS

## 2014-07-11 MED ORDER — ACETAMINOPHEN 325 MG PO TABS: 650.0000 mg | ORAL_TABLET | Freq: Four times a day (QID) | ORAL | Status: DC | PRN

## 2014-07-11 MED ORDER — METHOCARBAMOL 1000 MG/10ML IJ SOLN
500.0000 mg | Freq: Four times a day (QID) | INTRAMUSCULAR | Status: DC | PRN
  Administered 2014-07-11 – 2014-07-12 (×2): 500 mg via INTRAVENOUS
  Filled 2014-07-11 (×2): qty 5

## 2014-07-11 MED ORDER — LIDOCAINE HCL (CARDIAC) 20 MG/ML IV SOLN
INTRAVENOUS | Status: AC
  Filled 2014-07-11: qty 5

## 2014-07-11 MED ORDER — FENTANYL CITRATE 0.05 MG/ML IJ SOLN
INTRAMUSCULAR | Status: AC
Start: 2014-07-11 — End: 2014-07-12
  Filled 2014-07-11: qty 2

## 2014-07-11 MED ORDER — ACETAMINOPHEN 500 MG PO TABS: 1000.0000 mg | ORAL_TABLET | ORAL | Status: DC | PRN

## 2014-07-11 MED ORDER — ONDANSETRON HCL 4 MG/2ML IJ SOLN: 4.0000 mg | Freq: Four times a day (QID) | INTRAMUSCULAR | Status: DC | PRN

## 2014-07-11 MED ORDER — BISACODYL 5 MG PO TBEC
5.0000 mg | DELAYED_RELEASE_TABLET | Freq: Every day | ORAL | Status: DC | PRN
Start: 2014-07-11 — End: 2014-07-12

## 2014-07-11 MED ORDER — ACETAMINOPHEN 650 MG RE SUPP
650.0000 mg | Freq: Four times a day (QID) | RECTAL | Status: DC | PRN
Start: 2014-07-11 — End: 2014-07-12

## 2014-07-11 MED ORDER — HYDROMORPHONE HCL 1 MG/ML IJ SOLN
INTRAMUSCULAR | Status: DC | PRN
  Administered 2014-07-11: 0.5 mg via INTRAVENOUS
  Administered 2014-07-11: .25 mg via INTRAVENOUS
  Administered 2014-07-11: 1 mg via INTRAVENOUS
  Administered 2014-07-11: .25 mg via INTRAVENOUS

## 2014-07-11 MED ORDER — CYCLOBENZAPRINE HCL 10 MG PO TABS: 10.0000 mg | ORAL_TABLET | Freq: Three times a day (TID) | ORAL | Status: DC | PRN

## 2014-07-11 MED ORDER — THROMBIN 5000 UNITS EX SOLR
CUTANEOUS | Status: AC
  Filled 2014-07-11: qty 5000

## 2014-07-11 MED ORDER — HYDROMORPHONE HCL 1 MG/ML IJ SOLN
INTRAMUSCULAR | Status: AC
  Administered 2014-07-12: 1 mg via INTRAVENOUS
  Filled 2014-07-11: qty 1

## 2014-07-11 MED ORDER — CLONAZEPAM 0.5 MG PO TABS: 0.5000 mg | ORAL_TABLET | Freq: Two times a day (BID) | ORAL | Status: DC | PRN

## 2014-07-11 MED ORDER — ONDANSETRON HCL 4 MG/2ML IJ SOLN
INTRAMUSCULAR | Status: AC
  Filled 2014-07-11: qty 2

## 2014-07-11 MED ORDER — NITROGLYCERIN 0.4 MG SL SUBL: 0.4000 mg | SUBLINGUAL_TABLET | SUBLINGUAL | Status: DC | PRN

## 2014-07-11 MED ORDER — MENTHOL 3 MG MT LOZG
1.0000 | LOZENGE | OROMUCOSAL | Status: DC | PRN
  Filled 2014-07-11: qty 9

## 2014-07-11 MED ORDER — GABAPENTIN 300 MG PO CAPS
300.0000 mg | ORAL_CAPSULE | Freq: Three times a day (TID) | ORAL | Status: DC
  Administered 2014-07-11 – 2014-07-12 (×2): 300 mg via ORAL
  Filled 2014-07-11 (×4): qty 1

## 2014-07-11 MED ORDER — CEFAZOLIN SODIUM 1-5 GM-% IV SOLN
1.0000 g | Freq: Four times a day (QID) | INTRAVENOUS | Status: AC
  Administered 2014-07-11 – 2014-07-12 (×3): 1 g via INTRAVENOUS
  Filled 2014-07-11 (×4): qty 50

## 2014-07-11 MED ORDER — ACETAMINOPHEN 10 MG/ML IV SOLN
1000.0000 mg | Freq: Once | INTRAVENOUS | Status: AC
  Administered 2014-07-11: 1000 mg via INTRAVENOUS
  Filled 2014-07-11: qty 100

## 2014-07-11 MED ORDER — METHOCARBAMOL 500 MG PO TABS
500.0000 mg | ORAL_TABLET | Freq: Four times a day (QID) | ORAL | Status: DC | PRN
Start: 2014-07-11 — End: 2014-07-12
  Administered 2014-07-11: 500 mg via ORAL
  Filled 2014-07-11: qty 1

## 2014-07-11 MED ORDER — LISINOPRIL 10 MG PO TABS
10.0000 mg | ORAL_TABLET | Freq: Every day | ORAL | Status: DC
  Administered 2014-07-11: 10 mg via ORAL
  Filled 2014-07-11 (×2): qty 1

## 2014-07-11 MED ORDER — POLYMYXIN B SULFATE 500000 UNITS IJ SOLR
INTRAMUSCULAR | Status: DC | PRN
  Administered 2014-07-11: 14:00:00

## 2014-07-11 MED ORDER — OXYCODONE-ACETAMINOPHEN 5-325 MG PO TABS
1.0000 | ORAL_TABLET | ORAL | Status: DC | PRN
  Administered 2014-07-11 – 2014-07-12 (×4): 2 via ORAL
  Filled 2014-07-11 (×3): qty 2

## 2014-07-11 MED ORDER — THROMBIN 5000 UNITS EX SOLR
CUTANEOUS | Status: DC | PRN
  Administered 2014-07-11: 5000 [IU] via TOPICAL

## 2014-07-11 MED ORDER — NEOSTIGMINE METHYLSULFATE 10 MG/10ML IV SOLN
INTRAVENOUS | Status: DC | PRN
Start: 2014-07-11 — End: 2014-07-11
  Administered 2014-07-11: 3 mg via INTRAVENOUS

## 2014-07-11 MED ORDER — OXYCODONE-ACETAMINOPHEN 5-325 MG PO TABS
ORAL_TABLET | ORAL | Status: AC
  Administered 2014-07-12: 2 via ORAL
  Filled 2014-07-11: qty 2

## 2014-07-11 MED ORDER — ROPINIROLE HCL 1 MG PO TABS
1.0000 mg | ORAL_TABLET | Freq: Two times a day (BID) | ORAL | Status: DC
  Administered 2014-07-11 – 2014-07-12 (×2): 1 mg via ORAL
  Filled 2014-07-11 (×3): qty 1

## 2014-07-11 MED ORDER — CEFAZOLIN SODIUM-DEXTROSE 2-3 GM-% IV SOLR
INTRAVENOUS | Status: AC
  Filled 2014-07-11: qty 50

## 2014-07-11 SURGICAL SUPPLY — 47 items
ADH SKN CLS APL DERMABOND .7 (GAUZE/BANDAGES/DRESSINGS) ×1
BAG SPEC THK2 15X12 ZIP CLS (MISCELLANEOUS)
BAG ZIPLOCK 12X15 (MISCELLANEOUS) IMPLANT
BLADE OSCILLATING/SAGITTAL (BLADE) ×2
BLADE SW THK.38XMED LNG THN (BLADE) ×1 IMPLANT
BNDG COHESIVE 6X5 TAN NS LF (GAUZE/BANDAGES/DRESSINGS) ×2 IMPLANT
BUR OVAL CARBIDE 4.0 (BURR) ×2 IMPLANT
CLEANER TIP ELECTROSURG 2X2 (MISCELLANEOUS) ×2 IMPLANT
DERMABOND ADVANCED (GAUZE/BANDAGES/DRESSINGS) ×1
DERMABOND ADVANCED .7 DNX12 (GAUZE/BANDAGES/DRESSINGS) ×1 IMPLANT
DRAPE POUCH INSTRU U-SHP 10X18 (DRAPES) ×2 IMPLANT
DRSG AQUACEL AG ADV 3.5X 6 (GAUZE/BANDAGES/DRESSINGS) ×2 IMPLANT
DURAPREP 26ML APPLICATOR (WOUND CARE) ×2 IMPLANT
ELECT REM PT RETURN 9FT ADLT (ELECTROSURGICAL) ×2
ELECTRODE REM PT RTRN 9FT ADLT (ELECTROSURGICAL) ×1 IMPLANT
GLOVE BIOGEL PI IND STRL 6.5 (GLOVE) ×1 IMPLANT
GLOVE BIOGEL PI IND STRL 8 (GLOVE) ×1 IMPLANT
GLOVE BIOGEL PI INDICATOR 6.5 (GLOVE) ×1
GLOVE BIOGEL PI INDICATOR 8 (GLOVE) ×4
GLOVE ECLIPSE 8.0 STRL XLNG CF (GLOVE) ×2 IMPLANT
GLOVE SURG SS PI 6.5 STRL IVOR (GLOVE) ×2 IMPLANT
GOWN STRL REUS W/TWL LRG LVL3 (GOWN DISPOSABLE) ×4 IMPLANT
GOWN STRL REUS W/TWL XL LVL3 (GOWN DISPOSABLE) ×1 IMPLANT
KIT BASIN OR (CUSTOM PROCEDURE TRAY) ×2 IMPLANT
KIT POSITION SHOULDER SCHLEI (MISCELLANEOUS) ×2 IMPLANT
MANIFOLD NEPTUNE II (INSTRUMENTS) ×2 IMPLANT
NDL MA TROC 1/2 (NEEDLE) IMPLANT
NEEDLE MA TROC 1/2 (NEEDLE) IMPLANT
NS IRRIG 1000ML POUR BTL (IV SOLUTION) IMPLANT
PACK SHOULDER CUSTOM OPM052 (CUSTOM PROCEDURE TRAY) ×2 IMPLANT
PATCH TISSUE MEND 3X3CM (Orthopedic Implant) ×1 IMPLANT
POSITIONER SURGICAL ARM (MISCELLANEOUS) ×1 IMPLANT
SLING ARM IMMOBILIZER LRG (SOFTGOODS) ×2 IMPLANT
SPONGE LAP 4X18 X RAY DECT (DISPOSABLE) IMPLANT
SPONGE SURGIFOAM ABS GEL 100 (HEMOSTASIS) ×2 IMPLANT
STAPLER VISISTAT 35W (STAPLE) IMPLANT
SUCTION FRAZIER 12FR DISP (SUCTIONS) ×2 IMPLANT
SUT BONE WAX W31G (SUTURE) ×2 IMPLANT
SUT ETHIBOND NAB CT1 #1 30IN (SUTURE) ×3 IMPLANT
SUT MNCRL AB 4-0 PS2 18 (SUTURE) ×2 IMPLANT
SUT VIC AB 0 CT1 27 (SUTURE) ×2
SUT VIC AB 0 CT1 27XBRD ANTBC (SUTURE) IMPLANT
SUT VIC AB 1 CT1 27 (SUTURE) ×4
SUT VIC AB 1 CT1 27XBRD ANTBC (SUTURE) ×2 IMPLANT
SUT VIC AB 2-0 CT1 27 (SUTURE) ×4
SUT VIC AB 2-0 CT1 TAPERPNT 27 (SUTURE) ×2 IMPLANT
TOWEL OR 17X26 10 PK STRL BLUE (TOWEL DISPOSABLE) ×2 IMPLANT

## 2014-07-11 NOTE — Progress Notes (Signed)
Patient fell at home tripping over puppy. Sustained injury to left wrist. To make Dr Gladstone Lighter aware.

## 2014-07-11 NOTE — Anesthesia Postprocedure Evaluation (Signed)
  Anesthesia Post-op Note  Patient: Bradley Morgan  Procedure(s) Performed: Procedure(s) (LRB): ROTATOR CUFF REPAIR SHOULDER OPEN WITH  GRAFT  (Left)  Patient Location: PACU  Anesthesia Type: General  Level of Consciousness: awake and alert   Airway and Oxygen Therapy: Patient Spontanous Breathing  Post-op Pain: mild  Post-op Assessment: Post-op Vital signs reviewed, Patient's Cardiovascular Status Stable, Respiratory Function Stable, Patent Airway and No signs of Nausea or vomiting  Last Vitals:  Filed Vitals:   07/11/14 1112  BP: 161/83  Pulse: 95  Temp: 36.8 C  Resp: 20    Post-op Vital Signs: stable   Complications: No apparent anesthesia complications

## 2014-07-11 NOTE — Interval H&P Note (Signed)
History and Physical Interval Note:  07/11/2014 12:40 PM  Bradley Morgan  has presented today for surgery, with the diagnosis of LEFT SHOULDER ROTATOR CUFF TEAR  The various methods of treatment have been discussed with the patient and family. After consideration of risks, benefits and other options for treatment, the patient has consented to  Procedure(s): [place] (Left) as a surgical intervention .  The patient's history has been reviewed, patient examined, no change in status, stable for surgery.  I have reviewed the patient's chart and labs.  Questions were answered to the patient's satisfaction.     Shanine Kreiger A

## 2014-07-11 NOTE — Brief Op Note (Signed)
07/11/2014  2:30 PM  PATIENT:  Monna Fam  63 y.o. male  PRE-OPERATIVE DIAGNOSIS:  LEFT SHOULDER ROTATOR CUFF TEAR  POST-OPERATIVE DIAGNOSIS:  left shoulder rotator cuff tear  PROCEDURE:  Procedure(s): ROTATOR CUFF REPAIR SHOULDER OPEN WITH  GRAFT  (Left)  SURGEON:  Surgeon(s) and Role:    * Tobi Bastos, MD - Primary  PHYSICIAN ASSISTANT:Amber Bloomfield PA   ASSISTANTS: Ardeen Jourdain PA  ANESTHESIA:   general  EBL:     BLOOD ADMINISTERED:none  DRAINS: none   LOCAL MEDICATIONS USED:  BUPIVICAINE  Exparel 20cc  SPECIMEN:  No Specimen  DISPOSITION OF SPECIMEN:  N/A  COUNTS:  YES  TOURNIQUET:  * No tourniquets in log *  DICTATION: .Other Dictation: Dictation Number [id number]  PLAN OF CARE: Admit for overnight observation  PATIENT DISPOSITION:  Stable in OR   Delay start of Pharmacological VTE agent (>24hrs) due to surgical blood loss or risk of bleeding: yes

## 2014-07-11 NOTE — Discharge Instructions (Signed)
Keep your sling on at all times, including sleeping in your sling. °The only time you should remove your sling is to shower only but you need to keep your hand against your chest while you shower.  °Do not remove your dressing.  °Call Dr. Gioffre if any wound complications or temperature of 101 degrees F or over.  °Call the office for an appointment to see Dr. Gioffre in two weeks: [phone] and ask for Dr. Gioffre's nurse, Tammy Johnson.  °

## 2014-07-11 NOTE — Transfer of Care (Signed)
Immediate Anesthesia Transfer of Care Note  Patient: Bradley Morgan  Procedure(s) Performed: Procedure(s) (LRB): ROTATOR CUFF REPAIR SHOULDER OPEN WITH  GRAFT  (Left)  Patient Location: PACU  Anesthesia Type: General  Level of Consciousness: sedated, patient cooperative and responds to stimulation  Airway & Oxygen Therapy: Patient Spontanous Breathing and Patient connected to face mask oxgen  Post-op Assessment: Report given to PACU RN and Post -op Vital signs reviewed and stable  Post vital signs: Reviewed and stable  Complications: No apparent anesthesia complications

## 2014-07-12 ENCOUNTER — Encounter (HOSPITAL_COMMUNITY): Payer: Self-pay | Admitting: Orthopedic Surgery

## 2014-07-12 DIAGNOSIS — M75122 Complete rotator cuff tear or rupture of left shoulder, not specified as traumatic: Secondary | ICD-10-CM | POA: Diagnosis not present

## 2014-07-12 NOTE — Evaluation (Addendum)
Occupational Therapy Evaluation Patient Details Name: CHAITANYA AMEDEE MRN: [id number] DOB: 12-Aug-1951 Today's Date: 07/12/2014    History of Present Illness Pt is s/p left ROTATOR CUFF REPAIR SHOULDER OPEN WITH  GRAFT     Clinical Impression   Pt up to the bathroom to use urinal. Pt states he usually uses a cane so noted pt holding to wall and furniture today. Pt is familiar with shoulder care information from a previous shoulder surgery. Pt only with a pullover shirt so donned shirt over top of the sling on the L UE. Emphasized need for button down shirts for home. Pt with a soft wrist brace for L UE as he reports a recent fall  But he did not have the brace on. Assisted pt to don L wrist brace before donning sling. Instructed pt on all shoulder care and he reports feeling ok to verbalize information to his wife on how to assist. Pt needed intermittent cues to not move L shoulder initially and keep UE supported on pillow.  Demonstrated how to flex/extend digits and elbow exercises. Pt verbalizes understanding of how to perform. Wrist not encouraged due to pain with movement in wrist/wrist support required.  Encouraged ice frequently to L shoulder also. No further acute OT needs.     Follow Up Recommendations  Supervision/Assistance - 24 hour;No OT follow up    Equipment Recommendations  None recommended by OT    Recommendations for Other Services       Precautions / Restrictions Precautions Precautions: Shoulder Type of Shoulder Precautions: Issued shoulder care handout and reviewed all information with pt Shoulder Interventions: Shoulder sling/immobilizer;Off for dressing/bathing/exercises Precaution Booklet Issued: Yes (comment) Required Braces or Orthoses: Other Brace/Splint Other Brace/Splint: wrist brace for L UE Restrictions Weight Bearing Restrictions: No      Mobility Bed Mobility                  Transfers                      Balance                                             ADL                                               Vision                     Perception     Praxis      Pertinent Vitals/Pain Pain Assessment: 0-10 Pain Score: 7  Pain Location: L shoulder Pain Descriptors / Indicators: Aching Pain Intervention(s): Repositioned;Patient requesting pain meds-RN notified;Ice applied     Hand Dominance Right   Extremity/Trunk Assessment             Communication Communication Communication: No difficulties   Cognition Arousal/Alertness: Awake/alert Behavior During Therapy: WFL for tasks assessed/performed Overall Cognitive Status: Within Functional Limits for tasks assessed                     General Comments       Exercises       Shoulder Instructions Shoulder Instructions Donning/doffing shirt without moving shoulder: Patient able to independently direct caregiver  Method for sponge bathing under operated UE: Patient able to independently direct caregiver Donning/doffing sling/immobilizer: Patient able to independently direct caregiver Correct positioning of sling/immobilizer: Patient able to independently direct caregiver Sling wearing schedule (on at all times/off for ADL's): Independent Proper positioning of operated UE when showering: Independent Positioning of UE while sleeping: Patient able to independently direct caregiver    Home Living Family/patient expects to be discharged to:: Private residence Living Arrangements: Spouse/significant other                           Home Equipment: Bedside commode;Cane - single point          Prior Functioning/Environment Level of Independence: Independent with assistive device(s)             OT Diagnosis: Generalized weakness   OT Problem List:     OT Treatment/Interventions:      OT Goals(Current goals can be found in the care plan section) Acute Rehab OT Goals Patient  Stated Goal: home  OT Frequency:     Barriers to D/C:            Co-evaluation              End of Session    Activity Tolerance: Patient tolerated treatment well Patient left: in chair;with call bell/phone within reach   Time: 1012-1100 OT Time Calculation (min): 48 min Charges:  OT General Charges $OT Visit: 1 Procedure OT Evaluation $Initial OT Evaluation Tier I: 1 Procedure OT Treatments $Self Care/Home Management : 38-52 mins G-Codes: OT G-codes **NOT FOR INPATIENT CLASS** Functional Assessment Tool Used: clinical judgement Functional Limitation: Self care Self Care Current Status (F5732): At least 20 percent but less than 40 percent impaired, limited or restricted Self Care Goal Status (K0254): At least 20 percent but less than 40 percent impaired, limited or restricted Self Care Discharge Status [phone]): At least 20 percent but less than 40 percent impaired, limited or restricted  Jules Schick [phone] 07/12/2014, 11:17 AM

## 2014-07-12 NOTE — Op Note (Signed)
NAME:  Bradley Morgan, Bradley Morgan NO.:  [id number]  MEDICAL RECORD NO.:  [id number]  LOCATION:  4920                         FACILITY:  [facility]  PHYSICIAN:  Kipp Brood. Barton Want, M.D.DATE OF BIRTH:  [birth year]  DATE OF PROCEDURE:  07/11/2014 DATE OF DISCHARGE:                              OPERATIVE REPORT   SURGEON:  Kipp Brood. Gladstone Lighter, M.D.  ASSISTANT:  Ardeen Jourdain, [place]  PREOPERATIVE DIAGNOSIS: 1. Rotator cuff tendon tear, left shoulder. 2. Severe impingement syndrome, left shoulder.  POSTOPERATIVE DIAGNOSIS: 1. Rotator cuff tendon tear, left shoulder. 2. Severe impingement syndrome, left shoulder.  PROCEDURE: 1. Open acromionectomy and acromioplasty, left shoulder. 2. Repair of a tear of the rotator cuff tendon, left shoulder. 3. Would be a TissueMend graft to the rotator cuff tendon, left     shoulder.  DESCRIPTION OF PROCEDURE:  Under general anesthesia, a routine orthopedic prep and draping of the left upper extremity was carried out. He had 2 g of IV Ancef.  Note prior to surgery, we did the appropriate time-out.  We also marked the appropriate left shoulder in the holding area.  At this time, with the patient in the beach chair position, as I mentioned, he had 2 g of IV Ancef.  An incision was made over the anterior aspect of the left shoulder.  Bleeders were identified and cauterized.  I identified the acromion and then by sharp dissection, I separated the deltoid tendon, I split a small proximal portion of the deltoid muscle.  The self-retaining retractors were advanced.  I then noted he had a large area of impingement into the rotator cuff.  I protected the underlying cuff and then with the oscillating saw, then acromionectomy and then used a bur for acromioplasty.  Once we re- established the subacromial space, I then bone waxed the undersurface of the acromion.  We did a partial excision of the subdeltoid bursa as well.  We noted a large hole in the  rotator cuff.  I sutured that primarily, then we reinforced it with a TissueMend 3 x 3 graft.  No anchors were necessary.  We thoroughly irrigated out the area, reapproximated deltoid tendon muscle in usual fashion.  Subcutaneous was closed with 0 Vicryl.  We used a subcuticular Monocryl suture and sterile dressings were applied.  He was placed in a shoulder immobilizer.  Note, the patient had a tick that we noticed on his chest that was removed, and it was on the left lower chest.  We removed it with a forceps.  He had certainly no signs of any [facility] spotted fever or Lyme disease preop.  He will be observed postop.          ______________________________ Kipp Brood. Gladstone Lighter, M.D.     RAG/MEDQ  D:  07/11/2014  T:  07/11/2014  Job:  [id number]

## 2014-07-15 NOTE — Discharge Summary (Signed)
Physician Discharge Summary   Patient ID: Bradley Morgan MRN: [id number] DOB/AGE: Aug 05, 1951 63 y.o.  Admit date: 07/11/2014 Discharge date: 07/12/2014  Primary Diagnosis: Left shoulder rotator cuff tear   Admission Diagnoses:  Past Medical History  Diagnosis Date  . Hypertension   . Hyperlipemia   . Chronic pain     neck;lower back  . Multiple gastric ulcers     ruptured gastric ulcer 2001  . Complication of anesthesia     ANESTHESIA DID NOT TAKE EFFECT WITH BACK SURGERY "I FELT THEM CUT"  . PONV (postoperative nausea and vomiting)   . Family history of anesthesia complication     "hard time waking up my dad" many yrs ago  . Arthritis   . Rotator cuff tear     left  . GERD (gastroesophageal reflux disease)   . History of transfusion   . Restless leg syndrome   . Anxiety    Discharge Diagnoses:   Active Problems:   Complete rotator cuff tear  Estimated body mass index is 34.46 kg/(m^2) as calculated from the following:   Height as of this encounter: 5' 11" (1.803 m).   Weight as of this encounter: 112.038 kg (247 lb).  Procedure:  Procedure(s) (LRB): ROTATOR CUFF REPAIR SHOULDER OPEN WITH  GRAFT  (Left)   Consults: None  HPI: The patient presented with the chief complaint of left shoulder pain after a fall at home. He fell out of a chair and landed on his left shoulder. He developed pain, decreased ROM, and weakness in the left shoulder. MRI of the left shoulder revealed a tear of the left rotator cuff.   Laboratory Data: Hospital Outpatient Visit on 07/05/2014  Component Date Value Ref Range Status  . WBC 07/05/2014 5.8  4.0 - 10.5 K/uL Final  . RBC 07/05/2014 4.45  4.22 - 5.81 MIL/uL Final  . Hemoglobin 07/05/2014 13.3  13.0 - 17.0 g/dL Final  . HCT 07/05/2014 40.9  39.0 - 52.0 % Final  . MCV 07/05/2014 91.9  78.0 - 100.0 fL Final  . MCH 07/05/2014 29.9  26.0 - 34.0 pg Final  . MCHC 07/05/2014 32.5  30.0 - 36.0 g/dL Final  . RDW 07/05/2014 13.4  11.5 -  15.5 % Final  . Platelets 07/05/2014 172  150 - 400 K/uL Final  . Sodium 07/05/2014 141  137 - 147 mEq/L Final  . Potassium 07/05/2014 4.8  3.7 - 5.3 mEq/L Final  . Chloride 07/05/2014 102  96 - 112 mEq/L Final  . CO2 07/05/2014 28  19 - 32 mEq/L Final  . Glucose, Bld 07/05/2014 91  70 - 99 mg/dL Final  . BUN 07/05/2014 19  6 - 23 mg/dL Final  . Creatinine, Ser 07/05/2014 1.06  0.50 - 1.35 mg/dL Final  . Calcium 07/05/2014 9.4  8.4 - 10.5 mg/dL Final  . Total Protein 07/05/2014 6.9  6.0 - 8.3 g/dL Final  . Albumin 07/05/2014 4.0  3.5 - 5.2 g/dL Final  . AST 07/05/2014 17  0 - 37 U/L Final  . ALT 07/05/2014 13  0 - 53 U/L Final  . Alkaline Phosphatase 07/05/2014 89  39 - 117 U/L Final  . Total Bilirubin 07/05/2014 0.4  0.3 - 1.2 mg/dL Final  . GFR calc non Af Amer 07/05/2014 74* >90 mL/min Final  . GFR calc Af Amer 07/05/2014 86* >90 mL/min Final   Comment: (NOTE)  The eGFR has been calculated using the CKD EPI equation.                          This calculation has not been validated in all clinical situations.                          eGFR's persistently <90 mL/min signify possible Chronic Kidney                          Disease.  . Anion gap 07/05/2014 11  5 - 15 Final  . Prothrombin Time 07/05/2014 13.4  11.6 - 15.2 seconds Final  . INR 07/05/2014 1.01  0.00 - 1.49 Final  . Color, Urine 07/05/2014 YELLOW  YELLOW Final  . APPearance 07/05/2014 CLEAR  CLEAR Final  . Specific Gravity, Urine 07/05/2014 1.017  1.005 - 1.030 Final  . pH 07/05/2014 6.5  5.0 - 8.0 Final  . Glucose, UA 07/05/2014 NEGATIVE  NEGATIVE mg/dL Final  . Hgb urine dipstick 07/05/2014 NEGATIVE  NEGATIVE Final  . Bilirubin Urine 07/05/2014 NEGATIVE  NEGATIVE Final  . Ketones, ur 07/05/2014 NEGATIVE  NEGATIVE mg/dL Final  . Protein, ur 07/05/2014 NEGATIVE  NEGATIVE mg/dL Final  . Urobilinogen, UA 07/05/2014 0.2  0.0 - 1.0 mg/dL Final  . Nitrite 07/05/2014 NEGATIVE  NEGATIVE Final  .  Leukocytes, UA 07/05/2014 NEGATIVE  NEGATIVE Final   MICROSCOPIC NOT DONE ON URINES WITH NEGATIVE PROTEIN, BLOOD, LEUKOCYTES, NITRITE, OR GLUCOSE <1000 mg/dL.     X-Rays:Dg Wrist 2 Views Left  07/11/2014   CLINICAL DATA:  [place] 5 days ago, LEFT wrist pain, question wrist/distal forearm injury, post LEFT shoulder surgery today  EXAM: LEFT WRIST - 2 VIEW  COMPARISON:  Portable exam 1520 hr compared to LEFT hand radiographs 12/12/2012  FINDINGS: Joint spaces preserved.  Osseous mineralization normal for technique.  No acute fracture, dislocation or bone destruction.  Soft tissue swelling at dorsum of carpus.  IMPRESSION: No definite acute osseous abnormalities.   Electronically Signed   By: Lavonia Dana M.D.   On: 07/11/2014 18:53    EKG: Orders placed during the hospital encounter of 04/26/14  . EKG 12-LEAD  . EKG 12-LEAD  . EKG     Hospital Course: PERLIE SCHEURING is a 63 y.o. who was admitted to [facility]. They were brought to the operating room on 07/11/2014 - 07/12/2014 and underwent Procedure(s): [place] .  Patient tolerated the procedure well and was later transferred to the recovery room and then to the orthopaedic floor for postoperative care.  They were given PO and IV analgesics for pain control following their surgery.  They were given 24 hours of postoperative antibiotics of  Anti-infectives   Start     Dose/Rate Route Frequency Ordered Stop   07/11/14 1900  ceFAZolin (ANCEF) IVPB 1 g/50 mL premix     1 g 100 mL/hr over 30 Minutes Intravenous Every 6 hours 07/11/14 1641 07/12/14 0636   07/11/14 1355  polymyxin B 500,000 Units, bacitracin 50,000 Units in sodium chloride irrigation 0.9 % 500 mL irrigation  Status:  Discontinued       As needed 07/11/14 1355 07/11/14 1447   07/11/14 1112  ceFAZolin (ANCEF) IVPB 2 g/50 mL premix     2 g 100 mL/hr over 30 Minutes Intravenous On call to O.R. 07/11/14 1112 07/11/14  1307    OT were  ordered. Discharge planning consulted to help with postop disposition and equipment needs.  Patient had a fair night on the evening of surgery.  Patient was seen in rounds and was ready to go home.   Diet: Regular diet Activity:Wear sling at all times Follow-up:in 2 weeks Disposition - Home Discharged Condition: good   Discharge Instructions   Call MD / Call 911    Complete by:  As directed   If you experience chest pain or shortness of breath, CALL 911 and be transported to the hospital emergency room.  If you develope a fever above 101 F, pus (white drainage) or increased drainage or redness at the wound, or calf pain, call your surgeon's office.     Constipation Prevention    Complete by:  As directed   Drink plenty of fluids.  Prune juice may be helpful.  You may use a stool softener, such as Colace (over the counter) 100 mg twice a day.  Use MiraLax (over the counter) for constipation as needed.     Diet - low sodium heart healthy    Complete by:  As directed      Discharge instructions    Complete by:  As directed   Keep your sling on at all times, including sleeping in your sling. Do not remove your dressing The only time you should remove your sling is to shower only but you need to keep your hand against your chest while you shower.  Call Dr. Gioffre if any wound complications or temperature of 101 degrees F or over.  Call the office for an appointment to see Dr. Gioffre in two weeks: [phone] and ask for Dr. Gioffre's nurse, Tammy Johnson.     Driving restrictions    Complete by:  As directed   No driving     Increase activity slowly as tolerated    Complete by:  As directed      Lifting restrictions    Complete by:  As directed   No lifting            Medication List    STOP taking these medications       acetaminophen 500 MG tablet  Commonly known as:  TYLENOL     HYDROcodone-acetaminophen 10-325 MG per tablet  Commonly known as:  NORCO      TAKE these  medications       CLARITIN-D 12 HOUR PO  Take 1 tablet by mouth every morning.     clonazePAM 0.5 MG tablet  Commonly known as:  KLONOPIN  Take 0.5 mg by mouth 2 (two) times daily as needed for anxiety.     cyclobenzaprine 10 MG tablet  Commonly known as:  FLEXERIL  Take 1 tablet (10 mg total) by mouth 3 (three) times daily as needed for muscle spasms.     dimenhyDRINATE 50 MG tablet  Commonly known as:  DRAMAMINE  Take 50 mg by mouth every 8 (eight) hours as needed for nausea or dizziness.     gabapentin 300 MG capsule  Commonly known as:  NEURONTIN  Take 300 mg by mouth 3 (three) times daily.     ketorolac 10 MG tablet  Commonly known as:  TORADOL  Take 10 mg by mouth every 6 (six) hours as needed for moderate pain.     lisinopril 10 MG tablet  Commonly known as:  PRINIVIL,ZESTRIL  Take 10 mg by mouth at bedtime.     nitroGLYCERIN   0.4 MG SL tablet  Commonly known as:  NITROSTAT  Place 0.4 mg under the tongue every 5 (five) minutes as needed for chest pain.     oxyCODONE-acetaminophen 5-325 MG per tablet  Commonly known as:  PERCOCET/ROXICET  Take 1-2 tablets by mouth every 4 (four) hours as needed for moderate pain.     pantoprazole 20 MG tablet  Commonly known as:  PROTONIX  Take 20 mg by mouth daily.     rOPINIRole 1 MG tablet  Commonly known as:  REQUIP  Take 1 mg by mouth 2 (two) times daily.     simvastatin 40 MG tablet  Commonly known as:  ZOCOR  Take 40 mg by mouth at bedtime.     tetrahydrozoline 0.05 % ophthalmic solution  Place 1 drop into both eyes 3 (three) times daily as needed (dry eyes).           Follow-up Information   Follow up with GIOFFRE,RONALD A, MD. Schedule an appointment as soon as possible for a visit in 2 weeks.   Specialty:  Orthopedic Surgery   Contact information:   [address] [place] [address] [phone]       Signed: Ardeen Jourdain, PA-C Orthopaedic Surgery 07/15/2014, 9:07 AM

## 2014-10-04 ENCOUNTER — Encounter (HOSPITAL_COMMUNITY): Payer: Self-pay | Admitting: Emergency Medicine

## 2014-10-04 ENCOUNTER — Emergency Department (HOSPITAL_COMMUNITY): Payer: Self-pay

## 2014-10-04 ENCOUNTER — Emergency Department (HOSPITAL_COMMUNITY)
Admission: EM | Admit: 2014-10-04 | Discharge: 2014-10-04 | Disposition: A | Discharge status: Home / Self Care | Payer: Self-pay | Attending: Emergency Medicine | Admitting: Emergency Medicine

## 2014-10-04 DIAGNOSIS — E785 Hyperlipidemia, unspecified: Secondary | ICD-10-CM | POA: Insufficient documentation

## 2014-10-04 DIAGNOSIS — M1990 Unspecified osteoarthritis, unspecified site: Secondary | ICD-10-CM | POA: Insufficient documentation

## 2014-10-04 DIAGNOSIS — G2581 Restless legs syndrome: Secondary | ICD-10-CM | POA: Insufficient documentation

## 2014-10-04 DIAGNOSIS — Z87891 Personal history of nicotine dependence: Secondary | ICD-10-CM | POA: Insufficient documentation

## 2014-10-04 DIAGNOSIS — K219 Gastro-esophageal reflux disease without esophagitis: Secondary | ICD-10-CM | POA: Insufficient documentation

## 2014-10-04 DIAGNOSIS — Z79899 Other long term (current) drug therapy: Secondary | ICD-10-CM | POA: Insufficient documentation

## 2014-10-04 DIAGNOSIS — I493 Ventricular premature depolarization: Secondary | ICD-10-CM | POA: Insufficient documentation

## 2014-10-04 DIAGNOSIS — R0602 Shortness of breath: Secondary | ICD-10-CM | POA: Insufficient documentation

## 2014-10-04 DIAGNOSIS — R002 Palpitations: Secondary | ICD-10-CM

## 2014-10-04 DIAGNOSIS — Z79891 Long term (current) use of opiate analgesic: Secondary | ICD-10-CM | POA: Insufficient documentation

## 2014-10-04 DIAGNOSIS — I10 Essential (primary) hypertension: Secondary | ICD-10-CM | POA: Insufficient documentation

## 2014-10-04 DIAGNOSIS — G8929 Other chronic pain: Secondary | ICD-10-CM | POA: Insufficient documentation

## 2014-10-04 DIAGNOSIS — Z791 Long term (current) use of non-steroidal anti-inflammatories (NSAID): Secondary | ICD-10-CM | POA: Insufficient documentation

## 2014-10-04 DIAGNOSIS — R42 Dizziness and giddiness: Secondary | ICD-10-CM | POA: Insufficient documentation

## 2014-10-04 LAB — BASIC METABOLIC PANEL
ANION GAP: 11 (ref 5–15)
BUN: 16 mg/dL (ref 6–23)
CALCIUM: 9.5 mg/dL (ref 8.4–10.5)
CO2: 25 mmol/L (ref 19–32)
Chloride: 103 mEq/L (ref 96–112)
Creatinine, Ser: 0.91 mg/dL (ref 0.50–1.35)
GFR calc non Af Amer: 90 mL/min — ABNORMAL LOW (ref >90)
Glucose, Bld: 100 mg/dL — ABNORMAL HIGH (ref 70–99)
Potassium: 4 mmol/L (ref 3.5–5.1)
Sodium: 139 mmol/L (ref 135–145)

## 2014-10-04 LAB — CBC WITH DIFFERENTIAL/PLATELET
BASOS ABS: 0 10*3/uL (ref 0.0–0.1)
BASOS PCT: 0 % (ref 0–1)
EOS PCT: 1 % (ref 0–5)
Eosinophils Absolute: 0.1 10*3/uL (ref 0.0–0.7)
HCT: 36.4 % — ABNORMAL LOW (ref 39.0–52.0)
HEMOGLOBIN: 11.7 g/dL — AB (ref 13.0–17.0)
Lymphocytes Relative: 19 % (ref 12–46)
Lymphs Abs: 1 10*3/uL (ref 0.7–4.0)
MCH: 29 pg (ref 26.0–34.0)
MCHC: 32.1 g/dL (ref 30.0–36.0)
MCV: 90.3 fL (ref 78.0–100.0)
MONOS PCT: 5 % (ref 3–12)
Monocytes Absolute: 0.3 10*3/uL (ref 0.1–1.0)
Neutro Abs: 4 10*3/uL (ref 1.7–7.7)
Neutrophils Relative %: 75 % (ref 43–77)
Platelets: 190 10*3/uL (ref 150–400)
RBC: 4.03 MIL/uL — AB (ref 4.22–5.81)
RDW: 14 % (ref 11.5–15.5)
WBC: 5.3 10*3/uL (ref 4.0–10.5)

## 2014-10-04 LAB — PROTIME-INR
INR: 1.01 (ref 0.00–1.49)
PROTHROMBIN TIME: 13.4 s (ref 11.6–15.2)

## 2014-10-04 LAB — BRAIN NATRIURETIC PEPTIDE: B NATRIURETIC PEPTIDE 5: 82.3 pg/mL (ref 0.0–100.0)

## 2014-10-04 LAB — TROPONIN I: Troponin I: <0.03 ng/mL (ref <0.031)

## 2014-10-04 LAB — APTT: APTT: 30 s (ref 24–37)

## 2014-10-04 MED ORDER — METOCLOPRAMIDE HCL 5 MG/ML IJ SOLN
10.0000 mg | Freq: Once | INTRAMUSCULAR | Status: AC
  Administered 2014-10-04: 10 mg via INTRAVENOUS
  Filled 2014-10-04: qty 2

## 2014-10-04 MED ORDER — KETOROLAC TROMETHAMINE 30 MG/ML IJ SOLN
30.0000 mg | Freq: Once | INTRAMUSCULAR | Status: AC
  Administered 2014-10-04: 30 mg via INTRAVENOUS
  Filled 2014-10-04: qty 1

## 2014-10-04 NOTE — ED Notes (Addendum)
Pt comes via EMS from Dr office, c/o "feeling bad, can't take a good deep breath". Pt reports SOB, N, denies vomiting.  Pt reports new onset yesterday, denies cardiac hx, denies pain. NAD noted at this time.

## 2014-10-04 NOTE — ED Provider Notes (Signed)
CSN: [id number]     Arrival date & time 10/04/14  1415 History   First MD Initiated Contact with Patient 10/04/14 1438     Chief Complaint  Patient presents with  . Atrial Fibrillation     (Consider location/radiation/quality/duration/timing/severity/associated sxs/prior Treatment) HPI Comments: PT comes in with cc of afib. Pt has hx of HTN, HL. Reports that he was at his PCP's office for palpitations and dib. Pt's sx started about 2 days ago. He gets palpitations, feels that he has some dib with that. PT's sx are intermittent. Pt has no hx of PE, DVT and denies any exogenous estrogen use, long distance travels or surgery in the past 6 weeks, active cancer, recent immobilization. Pt has no cardiac dz. He had an EKG as PCP, which was interpreted as afib, and pt was sent to the ER. PT is noted to be in sinus rhythm when i saw him.   Patient is a 64 y.o. male presenting with atrial fibrillation. The history is provided by the patient.  Atrial Fibrillation Associated symptoms include shortness of breath. Pertinent negatives include no chest pain and no headaches.    Past Medical History  Diagnosis Date  . Hypertension   . Hyperlipemia   . Chronic pain     neck;lower back  . Multiple gastric ulcers     ruptured gastric ulcer 2001  . Complication of anesthesia     ANESTHESIA DID NOT TAKE EFFECT WITH BACK SURGERY "I FELT THEM CUT"  . PONV (postoperative nausea and vomiting)   . Family history of anesthesia complication     "hard time waking up my dad" many yrs ago  . Arthritis   . Rotator cuff tear     left  . GERD (gastroesophageal reflux disease)   . History of transfusion   . Restless leg syndrome   . Anxiety    Past Surgical History  Procedure Laterality Date  . Neck surgery  2012    2  total RODS / PLATES IN NECK  . Total knee arthroplasty Left   . Joint replacement    . Back surgery      5 back  . Rotator cuff repair Bilateral   . Hip surgery      LEFT - TORN TENDON   . Shoulder open rotator cuff repair Left 07/11/2014    Procedure: ROTATOR CUFF REPAIR SHOULDER OPEN WITH  GRAFT ;  Surgeon: Tobi Bastos, MD;  Location: [facility];  Service: Orthopedics;  Laterality: Left;   Family History  Problem Relation Age of Onset  . Heart failure Father   . Diabetes Father   . Hypertension Father   . Anxiety disorder Mother     also tended to overdose on anxiety medication   History  Substance Use Topics  . Smoking status: Former Smoker    Types: Cigarettes    Quit date: 10/09/1998  . Smokeless tobacco: Not on file  . Alcohol Use: No    Review of Systems  Constitutional: Negative for fever, chills and activity change.  Eyes: Negative for visual disturbance.  Respiratory: Positive for shortness of breath. Negative for cough and chest tightness.   Cardiovascular: Positive for palpitations. Negative for chest pain.  Gastrointestinal: Negative for abdominal distention.  Genitourinary: Negative for dysuria, enuresis and difficulty urinating.  Musculoskeletal: Negative for arthralgias and neck pain.  Neurological: Positive for dizziness. Negative for light-headedness and headaches.  Psychiatric/Behavioral: Negative for confusion.      Allergies  Morphine and related and  Aspirin  Home Medications   Prior to Admission medications   Medication Sig Start Date End Date Taking? Authorizing Provider  clonazePAM (KLONOPIN) 0.5 MG tablet Take 0.5 mg by mouth 2 (two) times daily as needed for anxiety.    Historical Provider, MD  cyclobenzaprine (FLEXERIL) 10 MG tablet Take 1 tablet (10 mg total) by mouth 3 (three) times daily as needed for muscle spasms. 07/11/14   Amber Renelda Loma, PA-C  dimenhyDRINATE (DRAMAMINE) 50 MG tablet Take 50 mg by mouth every 8 (eight) hours as needed for nausea or dizziness.    Historical Provider, MD  gabapentin (NEURONTIN) 300 MG capsule Take 300 mg by mouth 3 (three) times daily.    Historical Provider, MD  ketorolac  (TORADOL) 10 MG tablet Take 10 mg by mouth every 6 (six) hours as needed for moderate pain.    Historical Provider, MD  lisinopril (PRINIVIL,ZESTRIL) 10 MG tablet Take 10 mg by mouth at bedtime.     Historical Provider, MD  Loratadine-Pseudoephedrine (CLARITIN-D 12 HOUR PO) Take 1 tablet by mouth every morning.    Historical Provider, MD  nitroGLYCERIN (NITROSTAT) 0.4 MG SL tablet Place 0.4 mg under the tongue every 5 (five) minutes as needed for chest pain.    Historical Provider, MD  oxyCODONE-acetaminophen (PERCOCET/ROXICET) 5-325 MG per tablet Take 1-2 tablets by mouth every 4 (four) hours as needed for moderate pain. 07/11/14   Amber Renelda Loma, PA-C  pantoprazole (PROTONIX) 20 MG tablet Take 20 mg by mouth daily.    Historical Provider, MD  rOPINIRole (REQUIP) 1 MG tablet Take 1 mg by mouth 2 (two) times daily.     Historical Provider, MD  simvastatin (ZOCOR) 40 MG tablet Take 40 mg by mouth at bedtime.    Historical Provider, MD  tetrahydrozoline 0.05 % ophthalmic solution Place 1 drop into both eyes 3 (three) times daily as needed (dry eyes).    Historical Provider, MD   BP 171/88 mmHg  Pulse 87  Temp(Src) 98.3 F (36.8 C) (Oral)  Resp 15  SpO2 95% Physical Exam  Constitutional: He is oriented to person, place, and time. He appears well-developed.  HENT:  Head: Normocephalic and atraumatic.  Eyes: Conjunctivae and EOM are normal. Pupils are equal, round, and reactive to light.  Neck: Normal range of motion. Neck supple. No JVD present.  Cardiovascular: Normal rate and regular rhythm.   No murmur heard. Pulmonary/Chest: Effort normal and breath sounds normal.  Abdominal: Soft. Bowel sounds are normal. He exhibits no distension. There is no tenderness. There is no rebound and no guarding.  Musculoskeletal: He exhibits no edema or tenderness.  Neurological: He is alert and oriented to person, place, and time.  Skin: Skin is warm.  Nursing note and vitals reviewed.   ED  Course  Procedures (including critical care time) Labs Review Labs Reviewed  CBC WITH DIFFERENTIAL - Abnormal; Notable for the following:    RBC 4.03 (*)    Hemoglobin 11.7 (*)    HCT 36.4 (*)    All other components within normal limits  BASIC METABOLIC PANEL - Abnormal; Notable for the following:    Glucose, Bld 100 (*)    GFR calc non Af Amer 90 (*)    All other components within normal limits  BRAIN NATRIURETIC PEPTIDE  APTT  PROTIME-INR  TROPONIN I  TSH    Imaging Review Dg Chest 2 View  10/04/2014   CLINICAL DATA:  Shortness of breath.  EXAM: CHEST  2 VIEW  COMPARISON:  December 16, 2013.  FINDINGS: No pneumothorax or significant pleural effusion is noted. Stable cardiomediastinal silhouette. Status post surgical fusion of lower cervical spine. Elevated left hemidiaphragm is noted with associated left basilar subsegmental atelectasis. Right lung is clear.  IMPRESSION: Elevated left hemidiaphragm is noted with associated left basilar subsegmental atelectasis. Given that this is a new finding, paralysis of the left hemidiaphragm cannot be excluded.   Electronically Signed   By: Sabino Dick M.D.   On: 10/04/2014 15:26     EKG Interpretation   Date/Time:  Friday October 04 2014 14:28:16 EST Ventricular Rate:  83 PR Interval:  223 QRS Duration: 89 QT Interval:  395 QTC Calculation: 464 R Axis:   1 Text Interpretation:  Sinus rhythm Prolonged PR interval Confirmed by  Kathrynn Humble, MD, Thelma Comp [phone]) on 10/04/2014 4:16:01 PM      MDM   Final diagnoses:  Shortness of breath    Pt comes in with cc of dib and palpitations, and concerns for Afib. Pt is in sinus rhythm here. He had 2 EKGs, and the 1st ekg showed PVC with sinus rhythm. Reviewed the EKG from the PCP's office, and it has a lot of artifact, but also appears to be sinus. I had another EDP review the EKG, and Cardiologist look at both of the EKG here as well, and they all agree that there is no signs of afib.  Results  discussed with pt and family. He is certainly having PCP. There are no PE concerns. Trop and BNP are normal. Pt to get tele monitoring for extended period of time, and if there is no afib, we fill comfortable sending him home. Advised seeing PCP and getting a HOLTER. Pt and family agree with the plan.  Varney Biles, MD 10/04/14 1645

## 2014-10-04 NOTE — Discharge Instructions (Signed)
We saw you in the ER for the shortness of breath and palpitations. All the results in the ER are normal, labs and imaging. THERE IS NO AFIB seen here. You have PVC (read below). We are not sure what is causing your symptoms. The workup in the ER is not complete, and is limited to screening for life threatening and emergent conditions only, so please see a primary care doctor for further evaluation or call the Cardiologist.   Cardiac Event Monitoring A cardiac event monitor is a small recording device used to help detect abnormal heart rhythms (arrhythmias). The monitor is used to record heart rhythm when noticeable symptoms such as the following occur:  Fast heartbeats (palpitations), such as heart racing or fluttering.  Dizziness.  Fainting or light-headedness.  Unexplained weakness. The monitor is wired to two electrodes placed on your chest. Electrodes are flat, sticky disks that attach to your skin. The monitor can be worn for up to 30 days. You will wear the monitor at all times, except when bathing.  HOW TO USE YOUR CARDIAC EVENT MONITOR A technician will prepare your chest for the electrode placement. The technician will show you how to place the electrodes, how to work the monitor, and how to replace the batteries. Take time to practice using the monitor before you leave the office. Make sure you understand how to send the information from the monitor to your health care provider. This requires a telephone with a landline, not a cell phone. You need to:  Wear your monitor at all times, except when you are in water:  Do not get the monitor wet.  Take the monitor off when bathing. Do not swim or use a hot tub with it on.  Keep your skin clean. Do not put body lotion or moisturizer on your chest.  Change the electrodes daily or any time they stop sticking to your skin. You might need to use tape to keep them on.  It is possible that your skin under the electrodes could become  irritated. To keep this from happening, try to put the electrodes in slightly different places on your chest. However, they must remain in the area under your left breast and in the upper right section of your chest.  Make sure the monitor is safely clipped to your clothing or in a location close to your body that your health care provider recommends.  Press the button to record when you feel symptoms of heart trouble, such as dizziness, weakness, light-headedness, palpitations, thumping, shortness of breath, unexplained weakness, or a fluttering or racing heart. The monitor is always on and records what happened slightly before you pressed the button, so do not worry about being too late to get good information.  Keep a diary of your activities, such as walking, doing chores, and taking medicine. It is especially important to note what you were doing when you pushed the button to record your symptoms. This will help your health care provider determine what might be contributing to your symptoms. The information stored in your monitor will be reviewed by your health care provider alongside your diary entries.  Send the recorded information as recommended by your health care provider. It is important to understand that it will take some time for your health care provider to process the results.  Change the batteries as recommended by your health care provider. SEEK IMMEDIATE MEDICAL CARE IF:   You have chest pain.  You have extreme difficulty breathing or shortness of  breath.  You develop a very fast heartbeat that persists.  You develop dizziness that does not go away.  You faint or constantly feel you are about to faint. Document Released: 06/15/2008 Document Revised: 01/21/2014 Document Reviewed: 03/05/2013 [facility] Patient Information 2015 [place], [place]. This information is not intended to replace advice given to you by your health care provider. Make sure you discuss any questions you  have with your health care provider. Premature Ventricular Contraction Premature ventricular contraction (PVC) is an irregularity of the heart rhythm involving extra or skipped heartbeats. In some cases, they may occur without obvious cause or heart disease. Other times, they can be caused by an electrolyte change in the blood. These need to be corrected. They can also be seen when there is not enough oxygen going to the heart. A common cause of this is plaque or cholesterol buildup. This buildup decreases the blood supply to the heart. In addition, extra beats may be caused or aggravated by:  Excessive smoking.  Alcohol consumption.  Caffeine.  Certain medications  Some street drugs. SYMPTOMS   The sensation of feeling your heart skipping a beat (palpitations).  In many cases, the person may have no symptoms. SIGNS AND TESTS   A physical examination may show an occasional irregularity, but if the PVC beats do not happen often, they may not be found on physical exam.  Blood pressure is usually normal.  Other tests that may find extra beats of the heart are:  An EKG (electrocardiogram)  A Holter monitor which can monitor your heart over longer periods of time  An Angiogram (study of the heart arteries). TREATMENT  Usually extra heartbeats do not need treatment. The condition is treated only if symptoms are severe or if extra beats are very frequent or are causing problems. An underlying cause, if discovered, may also require treatment.  Treatment may also be needed if there may be a risk for other more serious cardiac arrhythmias.  PREVENTION   Moderation in caffeine, alcohol, and tobacco use may reduce the risk of ectopic heartbeats in some people.  Exercise often helps people who lead a sedentary (inactive) lifestyle. PROGNOSIS  PVC heartbeats are generally harmless and do not need treatment.  RISKS AND COMPLICATIONS   Ventricular tachycardia (occasionally).  There  usually are no complications.  Other arrhythmias (occasionally). SEEK IMMEDIATE MEDICAL CARE IF:   You feel palpitations that are frequent or continual.  You develop chest pain or other problems such as shortness of breath, sweating, or nausea and vomiting.  You become light-headed or faint (pass out).  You get worse or do not improve with treatment. Document Released: 04/23/2004 Document Revised: 11/29/2011 Document Reviewed: 11/03/2007 [facility] Patient Information 2015 [place], [place]. This information is not intended to replace advice given to you by your health care provider. Make sure you discuss any questions you have with your health care provider.

## 2014-10-05 ENCOUNTER — Emergency Department (HOSPITAL_COMMUNITY)
Admission: EM | Admit: 2014-10-05 | Discharge: 2014-10-05 | Disposition: A | Discharge status: Home / Self Care | Payer: Medicare Other | Attending: Emergency Medicine | Admitting: Emergency Medicine

## 2014-10-05 ENCOUNTER — Encounter (HOSPITAL_COMMUNITY): Payer: Self-pay | Admitting: *Deleted

## 2014-10-05 ENCOUNTER — Emergency Department (HOSPITAL_COMMUNITY): Payer: Medicare Other

## 2014-10-05 DIAGNOSIS — G8929 Other chronic pain: Secondary | ICD-10-CM | POA: Insufficient documentation

## 2014-10-05 DIAGNOSIS — G2581 Restless legs syndrome: Secondary | ICD-10-CM | POA: Insufficient documentation

## 2014-10-05 DIAGNOSIS — E785 Hyperlipidemia, unspecified: Secondary | ICD-10-CM | POA: Insufficient documentation

## 2014-10-05 DIAGNOSIS — M1990 Unspecified osteoarthritis, unspecified site: Secondary | ICD-10-CM | POA: Diagnosis not present

## 2014-10-05 DIAGNOSIS — Z87891 Personal history of nicotine dependence: Secondary | ICD-10-CM | POA: Diagnosis not present

## 2014-10-05 DIAGNOSIS — K219 Gastro-esophageal reflux disease without esophagitis: Secondary | ICD-10-CM | POA: Insufficient documentation

## 2014-10-05 DIAGNOSIS — R1084 Generalized abdominal pain: Secondary | ICD-10-CM | POA: Insufficient documentation

## 2014-10-05 DIAGNOSIS — I10 Essential (primary) hypertension: Secondary | ICD-10-CM | POA: Insufficient documentation

## 2014-10-05 DIAGNOSIS — K5900 Constipation, unspecified: Secondary | ICD-10-CM | POA: Diagnosis not present

## 2014-10-05 DIAGNOSIS — R110 Nausea: Secondary | ICD-10-CM | POA: Diagnosis not present

## 2014-10-05 DIAGNOSIS — F419 Anxiety disorder, unspecified: Secondary | ICD-10-CM | POA: Diagnosis not present

## 2014-10-05 DIAGNOSIS — R109 Unspecified abdominal pain: Secondary | ICD-10-CM

## 2014-10-05 LAB — CBC WITH DIFFERENTIAL/PLATELET
Basophils Absolute: 0 10*3/uL (ref 0.0–0.1)
Basophils Relative: 0 % (ref 0–1)
EOS ABS: 0.1 10*3/uL (ref 0.0–0.7)
EOS PCT: 1 % (ref 0–5)
HCT: 35.8 % — ABNORMAL LOW (ref 39.0–52.0)
HEMOGLOBIN: 11.9 g/dL — AB (ref 13.0–17.0)
Lymphocytes Relative: 19 % (ref 12–46)
Lymphs Abs: 1.1 10*3/uL (ref 0.7–4.0)
MCH: 29.7 pg (ref 26.0–34.0)
MCHC: 33.2 g/dL (ref 30.0–36.0)
MCV: 89.3 fL (ref 78.0–100.0)
MONOS PCT: 6 % (ref 3–12)
Monocytes Absolute: 0.3 10*3/uL (ref 0.1–1.0)
NEUTROS ABS: 4.2 10*3/uL (ref 1.7–7.7)
NEUTROS PCT: 74 % (ref 43–77)
Platelets: 184 10*3/uL (ref 150–400)
RBC: 4.01 MIL/uL — ABNORMAL LOW (ref 4.22–5.81)
RDW: 13.9 % (ref 11.5–15.5)
WBC: 5.6 10*3/uL (ref 4.0–10.5)

## 2014-10-05 LAB — COMPREHENSIVE METABOLIC PANEL
ALT: 17 U/L (ref 0–53)
AST: 20 U/L (ref 0–37)
Albumin: 3.8 g/dL (ref 3.5–5.2)
Alkaline Phosphatase: 77 U/L (ref 39–117)
Anion gap: 5 (ref 5–15)
BILIRUBIN TOTAL: 1 mg/dL (ref 0.3–1.2)
BUN: 15 mg/dL (ref 6–23)
CO2: 29 mmol/L (ref 19–32)
CREATININE: 1.03 mg/dL (ref 0.50–1.35)
Calcium: 9.5 mg/dL (ref 8.4–10.5)
Chloride: 106 mEq/L (ref 96–112)
GFR, EST AFRICAN AMERICAN: 89 mL/min — AB (ref >90)
GFR, EST NON AFRICAN AMERICAN: 76 mL/min — AB (ref >90)
GLUCOSE: 108 mg/dL — AB (ref 70–99)
POTASSIUM: 3.5 mmol/L (ref 3.5–5.1)
Sodium: 140 mmol/L (ref 135–145)
Total Protein: 6 g/dL (ref 6.0–8.3)

## 2014-10-05 LAB — CBG MONITORING, ED: Glucose-Capillary: 98 mg/dL (ref 70–99)

## 2014-10-05 LAB — URINALYSIS, ROUTINE W REFLEX MICROSCOPIC
Bilirubin Urine: NEGATIVE
GLUCOSE, UA: NEGATIVE mg/dL
Hgb urine dipstick: NEGATIVE
KETONES UR: NEGATIVE mg/dL
LEUKOCYTES UA: NEGATIVE
Nitrite: NEGATIVE
Protein, ur: NEGATIVE mg/dL
Specific Gravity, Urine: 1.016 (ref 1.005–1.030)
Urobilinogen, UA: 0.2 mg/dL (ref 0.0–1.0)
pH: 7 (ref 5.0–8.0)

## 2014-10-05 LAB — LIPASE, BLOOD: Lipase: 31 U/L (ref 11–59)

## 2014-10-05 LAB — POC OCCULT BLOOD, ED: FECAL OCCULT BLD: NEGATIVE

## 2014-10-05 MED ORDER — GI COCKTAIL ~~LOC~~
30.0000 mL | Freq: Once | ORAL | Status: AC
  Administered 2014-10-05: 30 mL via ORAL
  Filled 2014-10-05: qty 30

## 2014-10-05 MED ORDER — ONDANSETRON HCL 4 MG/2ML IJ SOLN
4.0000 mg | Freq: Once | INTRAMUSCULAR | Status: AC
  Administered 2014-10-05: 4 mg via INTRAVENOUS
  Filled 2014-10-05: qty 2

## 2014-10-05 MED ORDER — IOHEXOL 300 MG/ML  SOLN
80.0000 mL | Freq: Once | INTRAMUSCULAR | Status: AC | PRN
  Administered 2014-10-05: 80 mL via INTRAVENOUS

## 2014-10-05 MED ORDER — HYDROMORPHONE HCL 1 MG/ML IJ SOLN
1.0000 mg | Freq: Once | INTRAMUSCULAR | Status: AC
  Administered 2014-10-05: 1 mg via INTRAVENOUS
  Filled 2014-10-05: qty 1

## 2014-10-05 MED ORDER — PANTOPRAZOLE SODIUM 40 MG IV SOLR
40.0000 mg | Freq: Once | INTRAVENOUS | Status: AC
  Administered 2014-10-05: 40 mg via INTRAVENOUS
  Filled 2014-10-05: qty 40

## 2014-10-05 MED ORDER — ONDANSETRON HCL 4 MG PO TABS: 4.0000 mg | ORAL_TABLET | Freq: Four times a day (QID) | ORAL | Status: DC

## 2014-10-05 NOTE — ED Notes (Signed)
Missed IV insertion attempt.

## 2014-10-05 NOTE — Discharge Instructions (Signed)

## 2014-10-05 NOTE — ED Notes (Signed)
Pt reports abd pain x 3 days. Pt states he was seen here earlier for abnormal HR and was dc'd around 5:30pm. Pt has hx of stomach ulcers. Pt reports pain is burning sensation in lower abd. Per pt's wife. Pt has not had a bowel movement in 3-4 days. Pt has nausea.

## 2014-10-05 NOTE — ED Provider Notes (Signed)
Care assumed from Dr. Christy Gentles. CT scan negative for acute abnormalities. Advised patient to increase Protonix or add Zantac. He will also follow-up closely with his primary doctor. Abdomen soft without rigidity, rebound, or guarding at time of discharge.  Clinical Impression: 1. Abdominal pain       Bradley Siren III, MD 10/05/14 1037

## 2014-10-05 NOTE — ED Notes (Signed)
Pt signed signature pad, but it did not cross over.

## 2014-10-05 NOTE — ED Notes (Signed)
No bm for 3-4 days

## 2014-10-05 NOTE — ED Notes (Signed)
The pt is c/o abd pain for 2-3ays.  He was seen  Here earlier for another problem and he was given toradol.  The pt thinks the toradol  Hurt his stomach

## 2014-10-05 NOTE — ED Provider Notes (Signed)
CSN: [id number]     Arrival date & time 10/05/14  0449 History   First MD Initiated Contact with Patient 10/05/14 0534     Chief Complaint  Patient presents with  . Abdominal Pain     Patient is a 64 y.o. male presenting with abdominal pain. The history is provided by the patient and a significant other.  Abdominal Pain Pain location:  Generalized Pain quality: burning   Pain severity:  Moderate Onset quality:  Gradual Duration:  3 days Timing:  Intermittent Progression:  Worsening Chronicity:  New Relieved by:  Nothing Worsened by:  Palpation Associated symptoms: constipation, dysuria and nausea   Associated symptoms: no chest pain, no diarrhea, no fever and no vomiting   Risk factors: has not had multiple surgeries   Patient reports abdominal burning for over 3 days He reports constipation He reports h/o ulcers previously and feels similar    Past Medical History  Diagnosis Date  . Hypertension   . Hyperlipemia   . Chronic pain     neck;lower back  . Multiple gastric ulcers     ruptured gastric ulcer 2001  . Complication of anesthesia     ANESTHESIA DID NOT TAKE EFFECT WITH BACK SURGERY "I FELT THEM CUT"  . PONV (postoperative nausea and vomiting)   . Family history of anesthesia complication     "hard time waking up my dad" many yrs ago  . Arthritis   . Rotator cuff tear     left  . GERD (gastroesophageal reflux disease)   . History of transfusion   . Restless leg syndrome   . Anxiety    Past Surgical History  Procedure Laterality Date  . Neck surgery  2012    2  total RODS / PLATES IN NECK  . Total knee arthroplasty Left   . Joint replacement    . Back surgery      5 back  . Rotator cuff repair Bilateral   . Hip surgery      LEFT - TORN TENDON  . Shoulder open rotator cuff repair Left 07/11/2014    Procedure: ROTATOR CUFF REPAIR SHOULDER OPEN WITH  GRAFT ;  Surgeon: Tobi Bastos, MD;  Location: [facility];  Service: Orthopedics;  Laterality: Left;    Family History  Problem Relation Age of Onset  . Heart failure Father   . Diabetes Father   . Hypertension Father   . Anxiety disorder Mother     also tended to overdose on anxiety medication   History  Substance Use Topics  . Smoking status: Former Smoker    Types: Cigarettes    Quit date: 10/09/1998  . Smokeless tobacco: Not on file  . Alcohol Use: No    Review of Systems  Constitutional: Negative for fever.  Cardiovascular: Negative for chest pain.  Gastrointestinal: Positive for nausea, abdominal pain and constipation. Negative for vomiting and diarrhea.  Genitourinary: Positive for dysuria.  All other systems reviewed and are negative.     Allergies  Morphine and related and Aspirin  Home Medications   Prior to Admission medications   Medication Sig Start Date End Date Taking? Authorizing Provider  clonazePAM (KLONOPIN) 0.5 MG tablet Take 0.5 mg by mouth 2 (two) times daily as needed for anxiety.    Historical Provider, MD  cyclobenzaprine (FLEXERIL) 10 MG tablet Take 1 tablet (10 mg total) by mouth 3 (three) times daily as needed for muscle spasms. 07/11/14   Amber Renelda Loma, PA-C  dimenhyDRINATE (DRAMAMINE)  50 MG tablet Take 50 mg by mouth every 8 (eight) hours as needed for nausea or dizziness.    Historical Provider, MD  gabapentin (NEURONTIN) 300 MG capsule Take 300 mg by mouth 3 (three) times daily.    Historical Provider, MD  ketorolac (TORADOL) 10 MG tablet Take 10 mg by mouth every 6 (six) hours as needed for moderate pain.    Historical Provider, MD  lisinopril (PRINIVIL,ZESTRIL) 10 MG tablet Take 10 mg by mouth at bedtime.     Historical Provider, MD  Loratadine-Pseudoephedrine (CLARITIN-D 12 HOUR PO) Take 1 tablet by mouth every morning.    Historical Provider, MD  nitroGLYCERIN (NITROSTAT) 0.4 MG SL tablet Place 0.4 mg under the tongue every 5 (five) minutes as needed for chest pain.    Historical Provider, MD  oxyCODONE-acetaminophen  (PERCOCET/ROXICET) 5-325 MG per tablet Take 1-2 tablets by mouth every 4 (four) hours as needed for moderate pain. 07/11/14   Amber Renelda Loma, PA-C  pantoprazole (PROTONIX) 20 MG tablet Take 20 mg by mouth daily.    Historical Provider, MD  rOPINIRole (REQUIP) 1 MG tablet Take 1 mg by mouth 2 (two) times daily.     Historical Provider, MD  simvastatin (ZOCOR) 40 MG tablet Take 40 mg by mouth at bedtime.    Historical Provider, MD  tetrahydrozoline 0.05 % ophthalmic solution Place 1 drop into both eyes 3 (three) times daily as needed (dry eyes).    Historical Provider, MD   BP 148/86 mmHg  Pulse 76  Temp(Src) 98.1 F (36.7 C) (Oral)  Resp 18  Ht 6\' 1"  (1.854 m)  Wt 246 lb (111.585 kg)  BMI 32.46 kg/m2  SpO2 98% Physical Exam CONSTITUTIONAL: Well developed/well nourished HEAD: Normocephalic/atraumatic EYES: EOMI/PERRL ENMT: Mucous membranes moist NECK: supple no meningeal signs SPINE/BACK:entire spine nontender CV: S1/S2 noted, no murmurs/rubs/gallops noted LUNGS: Lungs are clear to auscultation bilaterally, no apparent distress ABDOMEN: soft, diffuse moderate tenderness, no rebound or guarding, bowel sounds noted throughout abdomen GU:no cva tenderness, no hernia noted, chaperone present Rectal - minimal stool in vault.  No impaction.  No blood/melena noted.  No rectal mass noted.  Chaperone present NEURO: Pt is awake/alert/appropriate, moves all extremitiesx4.  No facial droop.   EXTREMITIES: pulses normal/equal, full ROM SKIN: warm, color normal PSYCH: no abnormalities of mood noted, alert and oriented to situation  ED Course  Procedures   7:43 AM Pt with continued abd pain of unclear etiology Will proceed with CT imaging D/w dr Doy Mince - f/u on CT imaging If negative can likely be discharged home Labs Review Labs Reviewed  COMPREHENSIVE METABOLIC PANEL - Abnormal; Notable for the following:    Glucose, Bld 108 (*)    GFR calc non Af Amer 76 (*)    GFR calc Af  Amer 89 (*)    All other components within normal limits  CBC WITH DIFFERENTIAL - Abnormal; Notable for the following:    RBC 4.01 (*)    Hemoglobin 11.9 (*)    HCT 35.8 (*)    All other components within normal limits  LIPASE, BLOOD  URINALYSIS, ROUTINE W REFLEX MICROSCOPIC  POC OCCULT BLOOD, ED    Imaging Review Dg Chest 2 View  10/04/2014   CLINICAL DATA:  Shortness of breath.  EXAM: CHEST  2 VIEW  COMPARISON:  December 16, 2013.  FINDINGS: No pneumothorax or significant pleural effusion is noted. Stable cardiomediastinal silhouette. Status post surgical fusion of lower cervical spine. Elevated left hemidiaphragm is noted with associated  left basilar subsegmental atelectasis. Right lung is clear.  IMPRESSION: Elevated left hemidiaphragm is noted with associated left basilar subsegmental atelectasis. Given that this is a new finding, paralysis of the left hemidiaphragm cannot be excluded.   Electronically Signed   By: Sabino Dick M.D.   On: 10/04/2014 15:26     EKG Interpretation   Date/Time:  Saturday October 05 2014 06:11:05 EST Ventricular Rate:  79 PR Interval:  227 QRS Duration: 90 QT Interval:  397 QTC Calculation: 455 R Axis:   15 Text Interpretation:  Sinus rhythm Prolonged PR interval Borderline T  abnormalities, inferior leads No significant change since last tracing  Confirmed by Christy Gentles  MD, Krystn Dermody ([id number]) on 10/05/2014 6:16:16 AM     Medications  ondansetron (ZOFRAN) injection 4 mg (4 mg Intravenous Given 10/05/14 0700)  pantoprazole (PROTONIX) injection 40 mg (40 mg Intravenous Given 10/05/14 0700)  HYDROmorphone (DILAUDID) injection 1 mg (1 mg Intravenous Given 10/05/14 0731)  ondansetron (ZOFRAN) injection 4 mg (4 mg Intravenous Given 10/05/14 0731)    MDM   Final diagnoses:  None    Nursing notes including past medical history and social history reviewed and considered in documentation Labs/vital reviewed myself and considered during evaluation Previous  records reviewed and considered     Sharyon Cable, MD 10/05/14 0745

## 2015-03-05 ENCOUNTER — Other Ambulatory Visit: Payer: Self-pay

## 2015-03-05 DIAGNOSIS — L02413 Cutaneous abscess of right upper limb: Secondary | ICD-10-CM

## 2015-03-06 ENCOUNTER — Ambulatory Visit (HOSPITAL_BASED_OUTPATIENT_CLINIC_OR_DEPARTMENT_OTHER)
Admission: RE | Admit: 2015-03-06 | Discharge: 2015-03-06 | Disposition: A | Discharge status: Home / Self Care | Payer: Medicare Other | Source: Ambulatory Visit | Attending: Orthopedic Surgery | Admitting: Orthopedic Surgery

## 2015-03-06 ENCOUNTER — Encounter (HOSPITAL_BASED_OUTPATIENT_CLINIC_OR_DEPARTMENT_OTHER): Payer: Self-pay | Admitting: Anesthesiology

## 2015-03-06 ENCOUNTER — Encounter (HOSPITAL_BASED_OUTPATIENT_CLINIC_OR_DEPARTMENT_OTHER)
Admission: RE | Disposition: A | Discharge status: Home / Self Care | Payer: Self-pay | Source: Ambulatory Visit | Attending: Orthopedic Surgery

## 2015-03-06 ENCOUNTER — Ambulatory Visit (HOSPITAL_BASED_OUTPATIENT_CLINIC_OR_DEPARTMENT_OTHER): Payer: Medicare Other | Admitting: Anesthesiology

## 2015-03-06 DIAGNOSIS — G8929 Other chronic pain: Secondary | ICD-10-CM | POA: Insufficient documentation

## 2015-03-06 DIAGNOSIS — K219 Gastro-esophageal reflux disease without esophagitis: Secondary | ICD-10-CM | POA: Diagnosis not present

## 2015-03-06 DIAGNOSIS — Z79899 Other long term (current) drug therapy: Secondary | ICD-10-CM | POA: Insufficient documentation

## 2015-03-06 DIAGNOSIS — Z886 Allergy status to analgesic agent status: Secondary | ICD-10-CM | POA: Insufficient documentation

## 2015-03-06 DIAGNOSIS — F419 Anxiety disorder, unspecified: Secondary | ICD-10-CM | POA: Insufficient documentation

## 2015-03-06 DIAGNOSIS — G2581 Restless legs syndrome: Secondary | ICD-10-CM | POA: Insufficient documentation

## 2015-03-06 DIAGNOSIS — L02413 Cutaneous abscess of right upper limb: Secondary | ICD-10-CM | POA: Diagnosis not present

## 2015-03-06 DIAGNOSIS — Z96652 Presence of left artificial knee joint: Secondary | ICD-10-CM | POA: Insufficient documentation

## 2015-03-06 DIAGNOSIS — Z87891 Personal history of nicotine dependence: Secondary | ICD-10-CM | POA: Diagnosis not present

## 2015-03-06 DIAGNOSIS — M1990 Unspecified osteoarthritis, unspecified site: Secondary | ICD-10-CM | POA: Diagnosis not present

## 2015-03-06 DIAGNOSIS — I10 Essential (primary) hypertension: Secondary | ICD-10-CM | POA: Diagnosis not present

## 2015-03-06 DIAGNOSIS — Z885 Allergy status to narcotic agent status: Secondary | ICD-10-CM | POA: Insufficient documentation

## 2015-03-06 DIAGNOSIS — E785 Hyperlipidemia, unspecified: Secondary | ICD-10-CM | POA: Insufficient documentation

## 2015-03-06 HISTORY — PX: INCISION AND DRAINAGE ABSCESS: SHX5864

## 2015-03-06 LAB — POCT I-STAT, CHEM 8
BUN: 20 mg/dL (ref 6–20)
CALCIUM ION: 1.18 mmol/L (ref 1.13–1.30)
CHLORIDE: 104 mmol/L (ref 101–111)
CREATININE: 1.2 mg/dL (ref 0.61–1.24)
Glucose, Bld: 104 mg/dL — ABNORMAL HIGH (ref 65–99)
HCT: 41 % (ref 39.0–52.0)
Hemoglobin: 13.9 g/dL (ref 13.0–17.0)
POTASSIUM: 4.7 mmol/L (ref 3.5–5.1)
Sodium: 137 mmol/L (ref 135–145)
TCO2: 22 mmol/L (ref 0–100)

## 2015-03-06 SURGERY — INCISION AND DRAINAGE, ABSCESS
Anesthesia: General | Laterality: Right

## 2015-03-06 MED ORDER — MEPERIDINE HCL 25 MG/ML IJ SOLN: 6.2500 mg | INTRAMUSCULAR | Status: DC | PRN

## 2015-03-06 MED ORDER — LACTATED RINGERS IV SOLN
INTRAVENOUS | Status: DC
  Administered 2015-03-06 (×2): via INTRAVENOUS

## 2015-03-06 MED ORDER — OXYCODONE HCL 5 MG/5ML PO SOLN: 5.0000 mg | Freq: Once | ORAL | Status: AC | PRN

## 2015-03-06 MED ORDER — SCOPOLAMINE 1 MG/3DAYS TD PT72
MEDICATED_PATCH | TRANSDERMAL | Status: AC
  Filled 2015-03-06: qty 1

## 2015-03-06 MED ORDER — OXYCODONE HCL 5 MG PO TABS
ORAL_TABLET | ORAL | Status: AC
  Filled 2015-03-06: qty 1

## 2015-03-06 MED ORDER — BUPIVACAINE HCL (PF) 0.25 % IJ SOLN
INTRAMUSCULAR | Status: DC | PRN
  Administered 2015-03-06: 6 mL

## 2015-03-06 MED ORDER — PROPOFOL 10 MG/ML IV BOLUS
INTRAVENOUS | Status: DC | PRN
  Administered 2015-03-06: 200 mg via INTRAVENOUS

## 2015-03-06 MED ORDER — DEXTROSE 5 % IV SOLN
1.0000 g | INTRAVENOUS | Status: DC | PRN
  Administered 2015-03-06: 1 g via INTRAVENOUS

## 2015-03-06 MED ORDER — OXYCODONE-ACETAMINOPHEN 10-325 MG PO TABS: 1.0000 | ORAL_TABLET | ORAL | Status: DC | PRN

## 2015-03-06 MED ORDER — OXYCODONE HCL 5 MG PO TABS
5.0000 mg | ORAL_TABLET | Freq: Once | ORAL | Status: AC | PRN
  Administered 2015-03-06: 5 mg via ORAL

## 2015-03-06 MED ORDER — FENTANYL CITRATE (PF) 100 MCG/2ML IJ SOLN
INTRAMUSCULAR | Status: AC
  Filled 2015-03-06: qty 4

## 2015-03-06 MED ORDER — ONDANSETRON HCL 4 MG/2ML IJ SOLN
INTRAMUSCULAR | Status: DC | PRN
  Administered 2015-03-06: 4 mg via INTRAVENOUS

## 2015-03-06 MED ORDER — SULFAMETHOXAZOLE-TRIMETHOPRIM 400-80 MG PO TABS: 1.0000 | ORAL_TABLET | Freq: Two times a day (BID) | ORAL | Status: DC

## 2015-03-06 MED ORDER — 0.9 % SODIUM CHLORIDE (POUR BTL) OPTIME
TOPICAL | Status: DC | PRN
  Administered 2015-03-06: 1000 mL

## 2015-03-06 MED ORDER — CHLORHEXIDINE GLUCONATE 4 % EX LIQD: 60.0000 mL | Freq: Once | CUTANEOUS | Status: DC

## 2015-03-06 MED ORDER — MIDAZOLAM HCL 5 MG/5ML IJ SOLN
INTRAMUSCULAR | Status: DC | PRN
  Administered 2015-03-06: 2 mg via INTRAVENOUS

## 2015-03-06 MED ORDER — LIDOCAINE HCL (CARDIAC) 20 MG/ML IV SOLN
INTRAVENOUS | Status: DC | PRN
  Administered 2015-03-06: 50 mg via INTRAVENOUS

## 2015-03-06 MED ORDER — FENTANYL CITRATE (PF) 100 MCG/2ML IJ SOLN
INTRAMUSCULAR | Status: DC | PRN
  Administered 2015-03-06 (×4): 50 ug via INTRAVENOUS

## 2015-03-06 MED ORDER — HYDROMORPHONE HCL 1 MG/ML IJ SOLN: 0.2500 mg | INTRAMUSCULAR | Status: DC | PRN

## 2015-03-06 MED ORDER — SCOPOLAMINE 1 MG/3DAYS TD PT72
1.0000 | MEDICATED_PATCH | TRANSDERMAL | Status: DC
  Administered 2015-03-06: 1.5 mg via TRANSDERMAL

## 2015-03-06 MED ORDER — MIDAZOLAM HCL 2 MG/2ML IJ SOLN
INTRAMUSCULAR | Status: AC
  Filled 2015-03-06: qty 2

## 2015-03-06 MED ORDER — DEXAMETHASONE SODIUM PHOSPHATE 4 MG/ML IJ SOLN
INTRAMUSCULAR | Status: DC | PRN
  Administered 2015-03-06: 10 mg via INTRAVENOUS

## 2015-03-06 SURGICAL SUPPLY — 50 items
BAG DECANTER FOR FLEXI CONT (MISCELLANEOUS) IMPLANT
BLADE MINI RND TIP GREEN BEAV (BLADE) IMPLANT
BLADE SURG 15 STRL LF DISP TIS (BLADE) ×1 IMPLANT
BLADE SURG 15 STRL SS (BLADE) ×2
BNDG CMPR 9X4 STRL LF SNTH (GAUZE/BANDAGES/DRESSINGS) ×1
BNDG COHESIVE 1X5 TAN STRL LF (GAUZE/BANDAGES/DRESSINGS) IMPLANT
BNDG COHESIVE 3X5 TAN STRL LF (GAUZE/BANDAGES/DRESSINGS) IMPLANT
BNDG COHESIVE 4X5 TAN STRL (GAUZE/BANDAGES/DRESSINGS) ×1 IMPLANT
BNDG ESMARK 4X9 LF (GAUZE/BANDAGES/DRESSINGS) ×1 IMPLANT
BNDG GAUZE ELAST 4 BULKY (GAUZE/BANDAGES/DRESSINGS) ×1 IMPLANT
CHLORAPREP W/TINT 26ML (MISCELLANEOUS) ×1 IMPLANT
CORDS BIPOLAR (ELECTRODE) ×2 IMPLANT
COVER BACK TABLE 60X90IN (DRAPES) ×2 IMPLANT
COVER MAYO STAND STRL (DRAPES) ×2 IMPLANT
CUFF TOURNIQUET SINGLE 18IN (TOURNIQUET CUFF) IMPLANT
DRAPE EXTREMITY T 121X128X90 (DRAPE) ×2 IMPLANT
DRAPE SURG 17X23 STRL (DRAPES) ×2 IMPLANT
DRSG KUZMA FLUFF (GAUZE/BANDAGES/DRESSINGS) ×1 IMPLANT
GAUZE PACKING IODOFORM 1/4X15 (GAUZE/BANDAGES/DRESSINGS) ×1 IMPLANT
GAUZE SPONGE 4X4 12PLY STRL (GAUZE/BANDAGES/DRESSINGS) ×2 IMPLANT
GAUZE XEROFORM 1X8 LF (GAUZE/BANDAGES/DRESSINGS) ×1 IMPLANT
GLOVE BIOGEL PI IND STRL 7.0 (GLOVE) IMPLANT
GLOVE BIOGEL PI IND STRL 8.5 (GLOVE) ×1 IMPLANT
GLOVE BIOGEL PI INDICATOR 7.0 (GLOVE) ×1
GLOVE BIOGEL PI INDICATOR 8.5 (GLOVE) ×1
GLOVE ECLIPSE 6.5 STRL STRAW (GLOVE) ×1 IMPLANT
GLOVE EXAM NITRILE MD LF STRL (GLOVE) ×1 IMPLANT
GLOVE SURG ORTHO 8.0 STRL STRW (GLOVE) ×2 IMPLANT
GOWN STRL REUS W/ TWL LRG LVL3 (GOWN DISPOSABLE) ×1 IMPLANT
GOWN STRL REUS W/TWL LRG LVL3 (GOWN DISPOSABLE) ×2
GOWN STRL REUS W/TWL XL LVL3 (GOWN DISPOSABLE) ×2 IMPLANT
LOOP VESSEL MAXI BLUE (MISCELLANEOUS) IMPLANT
NDL PRECISIONGLIDE 27X1.5 (NEEDLE) IMPLANT
NEEDLE PRECISIONGLIDE 27X1.5 (NEEDLE) ×2 IMPLANT
NS IRRIG 1000ML POUR BTL (IV SOLUTION) ×2 IMPLANT
PACK BASIN DAY SURGERY FS (CUSTOM PROCEDURE TRAY) ×2 IMPLANT
PAD CAST 3X4 CTTN HI CHSV (CAST SUPPLIES) IMPLANT
PADDING CAST ABS 4INX4YD NS (CAST SUPPLIES)
PADDING CAST ABS COTTON 4X4 ST (CAST SUPPLIES) ×1 IMPLANT
PADDING CAST COTTON 3X4 STRL (CAST SUPPLIES)
SPLINT PLASTER CAST XFAST 3X15 (CAST SUPPLIES) IMPLANT
SPLINT PLASTER XTRA FASTSET 3X (CAST SUPPLIES)
STOCKINETTE 4X48 STRL (DRAPES) ×2 IMPLANT
SWAB COLLECTION DEVICE MRSA (MISCELLANEOUS) ×1 IMPLANT
SYR BULB 3OZ (MISCELLANEOUS) ×2 IMPLANT
SYR CONTROL 10ML LL (SYRINGE) ×1 IMPLANT
TOWEL OR 17X24 6PK STRL BLUE (TOWEL DISPOSABLE) ×2 IMPLANT
TUBE ANAEROBIC SPECIMEN COL (MISCELLANEOUS) ×1 IMPLANT
TUBE FEEDING 5FR 15 INCH (TUBING) IMPLANT
UNDERPAD 30X30 (UNDERPADS AND DIAPERS) ×2 IMPLANT

## 2015-03-06 NOTE — Discharge Instructions (Addendum)

## 2015-03-06 NOTE — H&P (Signed)
Bradley Morgan is a 64 year-old right-hand dominant male referred by Dr. Rosendo Gros for consultation with respect to an infection on his right proximal forearm. This has been present for six weeks.  He has been seen at [facility].  He has been on multiple antibiotics including Septra, doxycycline, Keflex, apparently no cultures have been taken.  He complains of constant, severe, stabbing burning pain with a feeling of numbness and weakness in the area.  There is no history of injury.   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   He has no history of MRSA.  He is not on any anticoagulants.  ALLERGIES:     Morphine and aspirin.   MEDICATIONS:    Carvedilol, cephalexin, clonazepam, cyclobenzaprine, doxycycline , fexofenadine, hydrocodone-acetaminophen, Lisinopril, mupirocin calcium, pantoprazole sodium, ropinirole, simvastatin, sulfamethoxazole-trimethoprim, Viagra.  SURGICAL HISTORY:     He has had surgery on his neck, back, shoulders and knee.  FAMILY MEDICAL HISTORY:    Positive for diabetes, heart disease, high blood pressure, arthritis.  SOCIAL HISTORY:     He does not smoke or drink.  Married.  Disabled.    REVIEW OF SYSTEMS:     Positive for weight loss, fever, glasses, high blood pressure, stomach ulcer, skin ulcers, lumps, otherwise negative. Bradley Morgan is an 64 y.o. male.   Chief Complaint: lesion with drainage right arm HPI: see above  Past Medical History  Diagnosis Date  . Hypertension   . Hyperlipemia   . Chronic pain     neck;lower back  . Multiple gastric ulcers     ruptured gastric ulcer 2001  . Complication of anesthesia     ANESTHESIA DID NOT TAKE EFFECT WITH BACK SURGERY "I FELT THEM CUT"  . PONV (postoperative nausea and vomiting)   . Family history of anesthesia complication     "hard time waking up my dad" many yrs ago  . Arthritis   . Rotator cuff tear     left  . GERD (gastroesophageal reflux disease)   . History of transfusion   .  Restless leg syndrome   . Anxiety     Past Surgical History  Procedure Laterality Date  . Neck surgery  2012    2  total RODS / PLATES IN NECK  . Total knee arthroplasty Left   . Joint replacement    . Back surgery      5 back  . Rotator cuff repair Bilateral   . Hip surgery      LEFT - TORN TENDON  . Shoulder open rotator cuff repair Left 07/11/2014    Procedure: ROTATOR CUFF REPAIR SHOULDER OPEN WITH  GRAFT ;  Surgeon: Tobi Bastos, MD;  Location: [facility];  Service: Orthopedics;  Laterality: Left;    Family History  Problem Relation Age of Onset  . Heart failure Father   . Diabetes Father   . Hypertension Father   . Anxiety disorder Mother     also tended to overdose on anxiety medication   Social History:  reports that he quit smoking about 16 years ago. His smoking use included Cigarettes. He does not have any smokeless tobacco history on file. He reports that he does not drink alcohol or use illicit drugs.  Allergies:  Allergies  Allergen Reactions  . Morphine And Related Shortness Of Breath  . Aspirin     No high doses    No prescriptions prior to admission    No results found for this or any  previous visit (from the past 48 hour(s)).  No results found.   Pertinent items are noted in HPI.  There were no vitals taken for this visit.  General appearance: alert, cooperative and appears stated age Head: Normocephalic, without obvious abnormality Neck: no JVD Resp: clear to auscultation bilaterally Cardio: regular rate and rhythm, S1, S2 normal, no murmur, click, rub or gallop GI: soft, non-tender; bowel sounds normal; no masses,  no organomegaly Extremities: lesion with drainage right arm Pulses: 2+ and symmetric Skin: Skin color, texture, turgor normal. No rashes or lesions Neurologic: Grossly normal Incision/Wound: na  Assessment/Plan RADIOGRAPHS:     X-rays are negative.   DIAGNOSIS:      Infection, right arm.  RECOMMENDATIONS/PLAN:   I would  recommend I&D  as an outpatient.  We will also biopsy this to be certain there is not an insidious underlying pathology.   Bradley Morgan 03/06/2015, 11:13 AM

## 2015-03-06 NOTE — Op Note (Signed)
Dictation Number [id number]

## 2015-03-06 NOTE — Transfer of Care (Signed)
Immediate Anesthesia Transfer of Care Note  Patient: Bradley Morgan  Procedure(s) Performed: Procedure(s): INCISION AND DRAINAGE ABSCESS RIGHT ARM (Right)  Patient Location: PACU  Anesthesia Type:General  Level of Consciousness: awake and patient cooperative  Airway & Oxygen Therapy: Patient Spontanous Breathing and Patient connected to face mask oxygen  Post-op Assessment: Report given to RN and Post -op Vital signs reviewed and stable  Post vital signs: Reviewed and stable  Last Vitals:  Filed Vitals:   03/06/15 1213  BP: 163/89  Pulse: 93  Temp: 36.9 C  Resp: 18    Complications: No apparent anesthesia complications

## 2015-03-06 NOTE — Anesthesia Procedure Notes (Signed)
Procedure Name: LMA Insertion Date/Time: 03/06/2015 1:19 PM Performed by: Toula Moos L Pre-anesthesia Checklist: Patient identified, Emergency Drugs available, Suction available, Patient being monitored and Timeout performed Patient Re-evaluated:Patient Re-evaluated prior to inductionOxygen Delivery Method: Circle System Utilized Preoxygenation: Pre-oxygenation with 100% oxygen Intubation Type: IV induction Ventilation: Mask ventilation without difficulty LMA: LMA inserted LMA Size: 5.0 Number of attempts: 1 Airway Equipment and Method: Bite block Placement Confirmation: positive ETCO2 Tube secured with: Tape Dental Injury: Teeth and Oropharynx as per pre-operative assessment

## 2015-03-06 NOTE — Brief Op Note (Signed)
03/06/2015  1:53 PM  PATIENT:  Monna Fam  64 y.o. male  PRE-OPERATIVE DIAGNOSIS:  infection abscess right arm  POST-OPERATIVE DIAGNOSIS:  infection abscess right arm  PROCEDURE:  Procedure(s): INCISION AND DRAINAGE ABSCESS RIGHT ARM (Right)  SURGEON:  Surgeon(s) and Role:    * Daryll Brod, MD - Primary  PHYSICIAN ASSISTANT:   ASSISTANTS: none   ANESTHESIA:   local and general  EBL:  Total I/O In: 1000 [I.V.:1000] Out: -   BLOOD ADMINISTERED:none  DRAINS: none   LOCAL MEDICATIONS USED:  BUPIVICAINE   SPECIMEN:  Excision  DISPOSITION OF SPECIMEN:  PATHOLOGY  COUNTS:  YES  TOURNIQUET:   Total Tourniquet Time Documented: Upper Arm (Right) - 17 minutes Total: Upper Arm (Right) - 17 minutes   DICTATION: .Other Dictation: Dictation Number [id number]  PLAN OF CARE: Discharge to home after PACU  PATIENT DISPOSITION:  PACU - hemodynamically stable.

## 2015-03-06 NOTE — Anesthesia Postprocedure Evaluation (Signed)
  Anesthesia Post-op Note  Patient: Bradley Morgan  Procedure(s) Performed: Procedure(s): INCISION AND DRAINAGE ABSCESS RIGHT ARM (Right)  Patient Location: PACU  Anesthesia Type: General   Level of Consciousness: awake, alert  and oriented  Airway and Oxygen Therapy: Patient Spontanous Breathing  Post-op Pain: mild  Post-op Assessment: Post-op Vital signs reviewed  Post-op Vital Signs: Reviewed  Last Vitals:  Filed Vitals:   03/06/15 1500  BP: 140/77  Pulse: 88  Temp: 36.5 C  Resp: 16    Complications: No apparent anesthesia complications

## 2015-03-06 NOTE — Anesthesia Preprocedure Evaluation (Signed)
Anesthesia Evaluation  Patient identified by MRN, date of birth, ID band Patient awake    Reviewed: Allergy & Precautions, NPO status , Patient's Chart, lab work & pertinent test results  History of Anesthesia Complications (+) PONV  Airway Mallampati: I  TM Distance: >3 FB Neck ROM: Full    Dental  (+) Lower Dentures   Pulmonary former smoker,  breath sounds clear to auscultation        Cardiovascular hypertension, Pt. on medications Rhythm:Regular Rate:Normal     Neuro/Psych    GI/Hepatic PUD, GERD-  Medicated and Controlled,  Endo/Other    Renal/GU      Musculoskeletal   Abdominal   Peds  Hematology   Anesthesia Other Findings   Reproductive/Obstetrics                             Anesthesia Physical Anesthesia Plan  ASA: II  Anesthesia Plan: General   Post-op Pain Management:    Induction: Intravenous  Airway Management Planned: LMA  Additional Equipment:   Intra-op Plan:   Post-operative Plan: Extubation in OR  Informed Consent: I have reviewed the patients History and Physical, chart, labs and discussed the procedure including the risks, benefits and alternatives for the proposed anesthesia with the patient or authorized representative who has indicated his/her understanding and acceptance.   Dental advisory given  Plan Discussed with: CRNA, Anesthesiologist and Surgeon  Anesthesia Plan Comments:         Anesthesia Quick Evaluation

## 2015-03-07 ENCOUNTER — Encounter (HOSPITAL_BASED_OUTPATIENT_CLINIC_OR_DEPARTMENT_OTHER): Payer: Self-pay | Admitting: Orthopedic Surgery

## 2015-03-07 ENCOUNTER — Other Ambulatory Visit (HOSPITAL_COMMUNITY): Payer: Self-pay | Admitting: *Deleted

## 2015-03-07 ENCOUNTER — Encounter (HOSPITAL_COMMUNITY)
Admission: RE | Admit: 2015-03-07 | Discharge: 2015-03-07 | Disposition: A | Discharge status: Home / Self Care | Payer: Medicare Other | Source: Ambulatory Visit | Attending: Orthopedic Surgery | Admitting: Orthopedic Surgery

## 2015-03-07 DIAGNOSIS — Z9889 Other specified postprocedural states: Secondary | ICD-10-CM | POA: Insufficient documentation

## 2015-03-07 DIAGNOSIS — L02419 Cutaneous abscess of limb, unspecified: Secondary | ICD-10-CM | POA: Diagnosis present

## 2015-03-07 MED ORDER — SODIUM CHLORIDE 0.9 % IV SOLN
400.0000 mg | INTRAVENOUS | Status: DC
  Administered 2015-03-07: 400 mg via INTRAVENOUS
  Filled 2015-03-07: qty 8

## 2015-03-07 NOTE — Discharge Instructions (Signed)
Daptomycin injection What is this medicine? DAPTOMYCIN (DAP toe MYE sin) is a lipopeptide antibiotic. It is used to treat certain kinds of bacterial infections. It will not work for colds, flu, or other viral infections. This medicine may be used for other purposes; ask your health care provider or pharmacist if you have questions. COMMON BRAND NAME(S): Cubicin What should I tell my health care provider before I take this medicine? They need to know if you have any of these conditions: -kidney disease -an unusual or allergic reaction to daptomycin, other medicines, foods, dyes, or preservatives -pregnant or trying to get pregnant -breast-feeding How should I use this medicine? This medicine is for infusion into a vein. It is usually given by a health care professional in a hospital or clinic setting. If you get this medicine at home, you will be taught how to prepare and give this medicine. Use exactly as directed. Take your medicine at regular intervals. Do not take your medicine more often than directed. Take all of your medicine as directed even if you think you are better. Do not skip doses or stop your medicine early. It is important that you put your used needles and syringes in a special sharps container. Do not put them in a trash can. If you do not have a sharps container, call your pharmacist or healthcare provider to get one. Talk to your pediatrician regarding the use of this medicine in children. Special care may be needed. Overdosage: If you think you have taken too much of this medicine contact a poison control center or emergency room at once. NOTE: This medicine is only for you. Do not share this medicine with others. What if I miss a dose? If you miss a dose, take it as soon as you can. If it is almost time for your next dose, take only that dose. Do not take double or extra doses. What may interact with this medicine? -birth control pills -some antibiotics like  tobramycin This list may not describe all possible interactions. Give your health care provider a list of all the medicines, herbs, non-prescription drugs, or dietary supplements you use. Also tell them if you smoke, drink alcohol, or use illegal drugs. Some items may interact with your medicine. What should I watch for while using this medicine? Your condition will be monitored carefully while you are receiving this medicine. Do not treat diarrhea with over the counter products. Contact your doctor if you have diarrhea that lasts more than 2 days or if it is severe and watery. What side effects may I notice from receiving this medicine? Side effects that you should report to your doctor or health care professional as soon as possible: -allergic reactions like skin rash, itching or hives, swelling of the face, lips, or tongue -breathing problems -fever, infection -high or low blood pressure -muscle pain -numb or tingling pain -trouble passing urine or change in the amount of urine -unusually tired or weak -vomiting Side effects that usually do not require medical attention (report to your doctor or health care professional if they continue or are bothersome): -constipation or diarrhea -trouble sleeping -headache -nausea -stomach upset This list may not describe all possible side effects. Call your doctor for medical advice about side effects. You may report side effects to FDA at 1-800-FDA-1088. Where should I keep my medicine? Keep out of the reach of children. If you are using this medicine at home, you will be instructed on how to store this medicine. Throw away   any unused medicine after the expiration date on the label. NOTE: This sheet is a summary. It may not cover all possible information. If you have questions about this medicine, talk to your doctor, pharmacist, or health care provider.  2015, Elsevier/Gold Standard. (2013-04-13 07:33:48)  

## 2015-03-07 NOTE — Op Note (Signed)
NAME:  Bradley Morgan, Bradley Morgan NO.:  [id number]  MEDICAL RECORD NO.:  [id number]  LOCATION:                                 FACILITY:  PHYSICIAN:  Daryll Brod, M.D.       DATE OF BIRTH:  March 07, 1951  DATE OF PROCEDURE:  03/06/2015 DATE OF DISCHARGE:                              OPERATIVE REPORT   PREOPERATIVE DIAGNOSIS:  Infection, right arm.  POSTOPERATIVE DIAGNOSIS:  Infection, right arm.  OPERATION:  Incision and drainage with biopsy, right arm.  SURGEON:  Daryll Brod, MD  ASSISTANT:  None.  ANESTHESIA:  General with local infiltration.  ANESTHESIOLOGIST:  Lorrene Reid, MD  HISTORY:  The patient is a 64 year old male with a history of infection of his right arm that has been going on for a considerable period time. He has been on multiple antibiotics, this had not cleared.  He is admitted for incision and drainage, biopsy of a lesion on the proximal right forearm.  He is aware that there is no guarantee with the surgery; possibility of infection; recurrence of injury to arteries, nerves, tendons; incomplete relief of symptoms; dystrophy.  In preoperative area, the patient was seen, the extremity marked by both patient and surgeon.  PROCEDURE IN DETAIL:  The patient was brought to the operating room, where general anesthetic was carried out without difficulty under the direction of Dr. Al Corpus.  He was prepped using Betadine scrub and solution with the right arm free.  Time-out taken, confirming patient and procedure.  The arm was elevated for exsanguination.  Tourniquet placed high on the arm was inflated to 250 mmHg.  A biopsy was then taken of the draining area to be certain there was no underlying problem, this was sent to Pathology.  The wound was opened leading to an area of necrotic tissue, this had a secondary lesion more lateral to it, thus the incision was extended to include both area of necrotic tissue was removed.  Cultures were taken, aerobic,  anaerobic, fungal, acid-fast bacteria.  The area was thoroughly debrided of necrotic tissue with sharp dissection.  This was then copiously irrigated with saline and the wound packed with Iodoform gauze.  Sterile compressive dressing was applied.  On deflation of the tourniquet, all fingers immediately pinked.  He was taken to the recovery room for observation in satisfactory condition.  He will be discharged home to return in 4 days on Cubicin as an outpatient along with Septra DS for antibiotic coverage.  It is noted that part of fascia was debrided.          ______________________________ Daryll Brod, M.D.     GK/MEDQ  D:  03/06/2015  T:  03/07/2015  Job:  [id number]

## 2015-03-08 ENCOUNTER — Encounter (HOSPITAL_COMMUNITY): Payer: Self-pay | Admitting: Emergency Medicine

## 2015-03-08 ENCOUNTER — Emergency Department (HOSPITAL_COMMUNITY)
Admission: EM | Admit: 2015-03-08 | Discharge: 2015-03-08 | Disposition: A | Discharge status: Home / Self Care | Payer: Medicare Other | Attending: Emergency Medicine | Admitting: Emergency Medicine

## 2015-03-08 DIAGNOSIS — F419 Anxiety disorder, unspecified: Secondary | ICD-10-CM | POA: Insufficient documentation

## 2015-03-08 DIAGNOSIS — M1990 Unspecified osteoarthritis, unspecified site: Secondary | ICD-10-CM | POA: Diagnosis not present

## 2015-03-08 DIAGNOSIS — K219 Gastro-esophageal reflux disease without esophagitis: Secondary | ICD-10-CM | POA: Diagnosis not present

## 2015-03-08 DIAGNOSIS — E785 Hyperlipidemia, unspecified: Secondary | ICD-10-CM | POA: Insufficient documentation

## 2015-03-08 DIAGNOSIS — G8929 Other chronic pain: Secondary | ICD-10-CM | POA: Diagnosis not present

## 2015-03-08 DIAGNOSIS — I10 Essential (primary) hypertension: Secondary | ICD-10-CM | POA: Insufficient documentation

## 2015-03-08 DIAGNOSIS — Z79899 Other long term (current) drug therapy: Secondary | ICD-10-CM | POA: Insufficient documentation

## 2015-03-08 DIAGNOSIS — L089 Local infection of the skin and subcutaneous tissue, unspecified: Secondary | ICD-10-CM | POA: Insufficient documentation

## 2015-03-08 DIAGNOSIS — Z87891 Personal history of nicotine dependence: Secondary | ICD-10-CM | POA: Insufficient documentation

## 2015-03-08 DIAGNOSIS — K259 Gastric ulcer, unspecified as acute or chronic, without hemorrhage or perforation: Secondary | ICD-10-CM | POA: Diagnosis not present

## 2015-03-08 DIAGNOSIS — M79631 Pain in right forearm: Secondary | ICD-10-CM | POA: Diagnosis present

## 2015-03-08 MED ORDER — SODIUM CHLORIDE 0.9 % IV SOLN
4.0000 mg/kg | INTRAVENOUS | Status: AC
  Administered 2015-03-08: 419 mg via INTRAVENOUS
  Filled 2015-03-08: qty 8.38

## 2015-03-08 MED ORDER — OXYCODONE-ACETAMINOPHEN 5-325 MG PO TABS
2.0000 | ORAL_TABLET | Freq: Once | ORAL | Status: AC
  Administered 2015-03-08: 2 via ORAL
  Filled 2015-03-08: qty 2

## 2015-03-08 NOTE — ED Provider Notes (Signed)
CSN: [id number]     Arrival date & time 03/08/15  4481 History   First MD Initiated Contact with Patient 03/08/15 1000     Chief Complaint  Patient presents with  . IV Medication     (Consider location/radiation/quality/duration/timing/severity/associated sxs/prior Treatment) HPI   Pt with infection of right forearm x 6 weeks, on multiple rounds of antibiotics, I&D with biopsy and cultures by Dr Daryll Brod two days ago, presents for IV antibiotics.  Dr Fredna Dow has prescribed PO bactrim and IV daptomycin that patient has been getting at Short Stay but it is closed over the weekend.  Pt notes pain in his right forearm and stiffness in his hand that is unchanged.  Denies fevers, chills, myalgias, swelling in his arm, weakness or numbness in his hand.   Per Rx paper pt has, Dr Fredna Dow has prescribed 4mg /kg daptomycin IV Q 24 hours.    Past Medical History  Diagnosis Date  . Hypertension   . Hyperlipemia   . Chronic pain     neck;lower back  . Multiple gastric ulcers     ruptured gastric ulcer 2001  . Complication of anesthesia     ANESTHESIA DID NOT TAKE EFFECT WITH BACK SURGERY "I FELT THEM CUT"  . PONV (postoperative nausea and vomiting)   . Family history of anesthesia complication     "hard time waking up my dad" many yrs ago  . Arthritis   . Rotator cuff tear     left  . GERD (gastroesophageal reflux disease)   . History of transfusion   . Restless leg syndrome   . Anxiety    Past Surgical History  Procedure Laterality Date  . Neck surgery  2012    2  total RODS / PLATES IN NECK  . Total knee arthroplasty Left   . Joint replacement    . Back surgery      5 back  . Rotator cuff repair Bilateral   . Hip surgery      LEFT - TORN TENDON  . Shoulder open rotator cuff repair Left 07/11/2014    Procedure: ROTATOR CUFF REPAIR SHOULDER OPEN WITH  GRAFT ;  Surgeon: Tobi Bastos, MD;  Location: [facility];  Service: Orthopedics;  Laterality: Left;  . Incision and drainage abscess  Right 03/06/2015    Procedure: INCISION AND DRAINAGE ABSCESS RIGHT ARM;  Surgeon: Daryll Brod, MD;  Location: [facility];  Service: Orthopedics;  Laterality: Right;   Family History  Problem Relation Age of Onset  . Heart failure Father   . Diabetes Father   . Hypertension Father   . Anxiety disorder Mother     also tended to overdose on anxiety medication   History  Substance Use Topics  . Smoking status: Former Smoker    Types: Cigarettes    Quit date: 10/09/1998  . Smokeless tobacco: Not on file  . Alcohol Use: No    Review of Systems  Constitutional: Negative for fever and chills.  Respiratory: Negative for shortness of breath.   Cardiovascular: Negative for chest pain.  Gastrointestinal: Negative for nausea, vomiting, abdominal pain and diarrhea.  Musculoskeletal: Negative for myalgias.  Skin: Positive for wound. Negative for rash.  Allergic/Immunologic: Negative for immunocompromised state.  Neurological: Negative for weakness and numbness.      Allergies  Morphine and related and Aspirin  Home Medications   Prior to Admission medications   Medication Sig Start Date End Date Taking? Authorizing Provider  clonazePAM (KLONOPIN) 0.5 MG  tablet Take 0.5 mg by mouth 2 (two) times daily as needed for anxiety.    Historical Provider, MD  cyclobenzaprine (FLEXERIL) 10 MG tablet Take 1 tablet (10 mg total) by mouth 3 (three) times daily as needed for muscle spasms. 07/11/14   Amber Constable, PA-C  gabapentin (NEURONTIN) 300 MG capsule Take 300 mg by mouth 3 (three) times daily.    Historical Provider, MD  lisinopril (PRINIVIL,ZESTRIL) 10 MG tablet Take 10 mg by mouth at bedtime.     Historical Provider, MD  Loratadine-Pseudoephedrine (CLARITIN-D 12 HOUR PO) Take 1 tablet by mouth every morning.    Historical Provider, MD  ondansetron (ZOFRAN) 4 MG tablet Take 1 tablet (4 mg total) by mouth every 6 (six) hours. 10/05/14   Serita Grit, MD  oxyCODONE-acetaminophen  (PERCOCET) 10-325 MG per tablet Take 1 tablet by mouth every 4 (four) hours as needed for pain. 03/06/15   Daryll Brod, MD  oxyCODONE-acetaminophen (PERCOCET/ROXICET) 5-325 MG per tablet Take 1-2 tablets by mouth every 4 (four) hours as needed for moderate pain. 07/11/14   Amber Constable, PA-C  pantoprazole (PROTONIX) 20 MG tablet Take 20 mg by mouth daily.    Historical Provider, MD  rOPINIRole (REQUIP) 1 MG tablet Take 1 mg by mouth 2 (two) times daily.     Historical Provider, MD  simvastatin (ZOCOR) 40 MG tablet Take 40 mg by mouth at bedtime.    Historical Provider, MD  sulfamethoxazole-trimethoprim (BACTRIM) 400-80 MG per tablet Take 1 tablet by mouth 2 (two) times daily. 03/06/15   Daryll Brod, MD  tetrahydrozoline 0.05 % ophthalmic solution Place 1 drop into both eyes 3 (three) times daily as needed (dry eyes).    Historical Provider, MD   BP 149/78 mmHg  Pulse 90  Temp(Src) 98 F (36.7 C) (Oral)  Resp 18  Ht 5\' 10"  (1.778 m)  Wt 231 lb (104.781 kg)  BMI 33.15 kg/m2  SpO2 94% Physical Exam  Constitutional: He appears well-developed and well-nourished. No distress.  HENT:  Head: Normocephalic and atraumatic.  Neck: Neck supple.  Pulmonary/Chest: Effort normal.  Musculoskeletal:  Right forearm with bandage in place.  Right hand with 5/5 grip strength, sensation intact, radial pulses intact, capillary refill < 2 seconds, no edema.    Neurological: He is alert.  Skin: He is not diaphoretic.  Nursing note and vitals reviewed.   ED Course  Procedures (including critical care time) Labs Review Labs Reviewed - No data to display  Imaging Review No results found.   EKG Interpretation None      MDM   Final diagnoses:  Skin infection    Afebrile, nontoxic patient with ongoing right arm infection, receiving IV abx per Dr Fredna Dow.  Is to go to short stay during the week and ED during the weekend for IV daptomycin.  Received infusion without side effects or any problems.   D/C  home with return precautions. Will return tomorrow for same.   Discussed result, findings, treatment, and follow up  with patient.  Pt given return precautions.  Pt verbalizes understanding and agrees with plan.        Clayton Bibles, PA-C 03/08/15 [place], MD 03/18/15 [phone]

## 2015-03-08 NOTE — Discharge Instructions (Signed)
Read the information below.  You may return to the Emergency Department at any time for worsening condition or any new symptoms that concern you.   If you develop uncontrolled pain, weakness or numbness of the extremity, severe discoloration of the skin, or you are unable to move your fingers, call Dr Levell July office and return to the ER for a recheck.   If you develop rash, itching or swelling of the mouth or throat, difficulty swallowing or breathing, call 911 and return to the ER immediately for a recheck.

## 2015-03-08 NOTE — Progress Notes (Signed)
ANTIBIOTIC CONSULT NOTE - INITIAL  Pharmacy Consult for Cubicin Indication: R-arm infection, s/p I&D with biopsy  Allergies  Allergen Reactions  . Morphine And Related Shortness Of Breath  . Aspirin     No high doses    Patient Measurements: Height: 5\' 10"  (177.8 cm) Weight: 231 lb (104.781 kg) IBW/kg (Calculated) : 73 Adjusted Body Weight:   Vital Signs: Temp: 98 F (36.7 C) (06/18 0952) Temp Source: Oral (06/18 0952) BP: 144/80 mmHg (06/18 1020) Pulse Rate: 85 (06/18 1020) Intake/Output from previous day:   Intake/Output from this shift:    Labs:  Recent Labs  03/06/15 1245  HGB 13.9  CREATININE 1.20   Estimated Creatinine Clearance: 77.4 mL/min (by C-G formula based on Cr of 1.2). No results for input(s): VANCOTROUGH, VANCOPEAK, VANCORANDOM, GENTTROUGH, GENTPEAK, GENTRANDOM, TOBRATROUGH, TOBRAPEAK, TOBRARND, AMIKACINPEAK, AMIKACINTROU, AMIKACIN in the last 72 hours.   Microbiology: Recent Results (from the past 720 hour(s))  Anaerobic culture     Status: None (Preliminary result)   Collection Time: 03/06/15  1:38 PM  Result Value Ref Range Status   Specimen Description ABSCESS ARM RIGHT  Final   Special Requests RIGHT ARM MASS PT ON SEPTRA,DOXYCYCLINE,KEFLEX  Final   Gram Stain   Final    FEW WBC PRESENT,BOTH PMN AND MONONUCLEAR NO SQUAMOUS EPITHELIAL CELLS SEEN NO ORGANISMS SEEN Performed at [facility]    Culture   Final    NO ANAEROBES ISOLATED; CULTURE IN PROGRESS FOR 5 DAYS Performed at [facility]    Report Status PENDING  Incomplete  Fungus Culture with Smear     Status: None (Preliminary result)   Collection Time: 03/06/15  1:38 PM  Result Value Ref Range Status   Specimen Description ABSCESS ARM RIGHT  Final   Special Requests RIGHT ARM MASS PT ON SEPTRA,DOXYCYCLINE,KEFLEX  Final   Fungal Smear   Final    NO YEAST OR FUNGAL ELEMENTS SEEN Performed at [facility]    Culture   Final    CULTURE IN PROGRESS FOR  FOUR WEEKS Performed at [facility]    Report Status PENDING  Incomplete  AFB culture with smear     Status: None (Preliminary result)   Collection Time: 03/06/15  1:38 PM  Result Value Ref Range Status   Specimen Description ABSCESS ARM RIGHT  Final   Special Requests RIGHT ARM MASS PT ON SEPTRA,DOXYCYCLINE,KEFLEX  Final   Acid Fast Smear   Final    NO ACID FAST BACILLI SEEN Performed at [facility]    Culture   Final    CULTURE WILL BE EXAMINED FOR 6 WEEKS BEFORE ISSUING A FINAL REPORT Performed at [facility]    Report Status PENDING  Incomplete  Culture, routine-abscess     Status: None (Preliminary result)   Collection Time: 03/06/15  1:38 PM  Result Value Ref Range Status   Specimen Description ABSCESS ARM RIGHT  Final   Special Requests RIGHT ARM MASS PT ON SEPTRA,DOXYCYCLINE,KEFLEX  Final   Gram Stain   Final    FEW WBC PRESENT,BOTH PMN AND MONONUCLEAR NO SQUAMOUS EPITHELIAL CELLS SEEN NO ORGANISMS SEEN Performed at [facility]    Culture   Final    Culture reincubated for better growth Performed at [facility]    Report Status PENDING  Incomplete    Medical History: Past Medical History  Diagnosis Date  . Hypertension   . Hyperlipemia   . Chronic pain  neck;lower back  . Multiple gastric ulcers     ruptured gastric ulcer 2001  . Complication of anesthesia     ANESTHESIA DID NOT TAKE EFFECT WITH BACK SURGERY "I FELT THEM CUT"  . PONV (postoperative nausea and vomiting)   . Family history of anesthesia complication     "hard time waking up my dad" many yrs ago  . Arthritis   . Rotator cuff tear     left  . GERD (gastroesophageal reflux disease)   . History of transfusion   . Restless leg syndrome   . Anxiety     Medications:  Scheduled:  Assessment: 64yo male with infection of R-forearm x 6 wks and failing Septra, doxycycline, and Cephalexin.  No cx have been taken during this time.  Pt is now s/p I&D  with intra-operative cx (p) from 6/16.  He is presenting to ED for Cubicin dose today, which has received previously in Short Stay.  I have verified with RN that he is here for 1 dose and then is going home, as Short Stay is closed today.  Cr 1.2 on 6/16, est CrCl ~38ml/min  Goal of Therapy:  Treatment of infection  Plan:  Cubicin 4mg /kg IV x 1  Gracy Bruins, PharmD Clinical Pharmacist [facility]

## 2015-03-08 NOTE — ED Notes (Signed)
Pt here for IV antibiotics for infection in right arm. Pt usually goes to short stay.

## 2015-03-09 ENCOUNTER — Encounter (HOSPITAL_COMMUNITY): Payer: Self-pay | Admitting: Emergency Medicine

## 2015-03-09 ENCOUNTER — Emergency Department (HOSPITAL_COMMUNITY)
Admission: EM | Admit: 2015-03-09 | Discharge: 2015-03-09 | Disposition: A | Discharge status: Home / Self Care | Payer: Medicare Other | Attending: Emergency Medicine | Admitting: Emergency Medicine

## 2015-03-09 DIAGNOSIS — K219 Gastro-esophageal reflux disease without esophagitis: Secondary | ICD-10-CM | POA: Diagnosis not present

## 2015-03-09 DIAGNOSIS — Z79899 Other long term (current) drug therapy: Secondary | ICD-10-CM | POA: Diagnosis not present

## 2015-03-09 DIAGNOSIS — I10 Essential (primary) hypertension: Secondary | ICD-10-CM | POA: Insufficient documentation

## 2015-03-09 DIAGNOSIS — G8929 Other chronic pain: Secondary | ICD-10-CM | POA: Diagnosis not present

## 2015-03-09 DIAGNOSIS — Z792 Long term (current) use of antibiotics: Secondary | ICD-10-CM | POA: Diagnosis not present

## 2015-03-09 DIAGNOSIS — G2581 Restless legs syndrome: Secondary | ICD-10-CM | POA: Diagnosis not present

## 2015-03-09 DIAGNOSIS — L089 Local infection of the skin and subcutaneous tissue, unspecified: Secondary | ICD-10-CM | POA: Diagnosis not present

## 2015-03-09 DIAGNOSIS — Z87891 Personal history of nicotine dependence: Secondary | ICD-10-CM | POA: Diagnosis not present

## 2015-03-09 DIAGNOSIS — E785 Hyperlipidemia, unspecified: Secondary | ICD-10-CM | POA: Diagnosis not present

## 2015-03-09 DIAGNOSIS — F419 Anxiety disorder, unspecified: Secondary | ICD-10-CM | POA: Insufficient documentation

## 2015-03-09 DIAGNOSIS — M1990 Unspecified osteoarthritis, unspecified site: Secondary | ICD-10-CM | POA: Insufficient documentation

## 2015-03-09 MED ORDER — SODIUM CHLORIDE 0.9 % IV SOLN
4.0000 mg/kg | INTRAVENOUS | Status: AC
  Administered 2015-03-09: 419 mg via INTRAVENOUS
  Filled 2015-03-09 (×2): qty 8.38

## 2015-03-09 NOTE — Progress Notes (Signed)
ANTIBIOTIC CONSULT NOTE - INITIAL  Pharmacy Consult for Cubicin Indication: R-arm infection, s/p I&D with biopsy  Allergies  Allergen Reactions  . Morphine And Related Shortness Of Breath  . Aspirin     No high doses    Patient Measurements: Height: 5\' 10"  (177.8 cm) Weight: 231 lb (104.781 kg) IBW/kg (Calculated) : 73 Adjusted Body Weight:   Vital Signs: Temp: 98.4 F (36.9 C) (06/19 1041) Temp Source: Oral (06/19 1041) BP: 142/75 mmHg (06/19 1209) Pulse Rate: 84 (06/19 1209) Intake/Output from previous day:   Intake/Output from this shift:    Labs:  Recent Labs  03/06/15 1245  HGB 13.9  CREATININE 1.20   Estimated Creatinine Clearance: 77.4 mL/min (by C-G formula based on Cr of 1.2). No results for input(s): VANCOTROUGH, VANCOPEAK, VANCORANDOM, GENTTROUGH, GENTPEAK, GENTRANDOM, TOBRATROUGH, TOBRAPEAK, TOBRARND, AMIKACINPEAK, AMIKACINTROU, AMIKACIN in the last 72 hours.   Microbiology: Recent Results (from the past 720 hour(s))  Anaerobic culture     Status: None (Preliminary result)   Collection Time: 03/06/15  1:38 PM  Result Value Ref Range Status   Specimen Description ABSCESS ARM RIGHT  Final   Special Requests RIGHT ARM MASS PT ON SEPTRA,DOXYCYCLINE,KEFLEX  Final   Gram Stain   Final    FEW WBC PRESENT,BOTH PMN AND MONONUCLEAR NO SQUAMOUS EPITHELIAL CELLS SEEN NO ORGANISMS SEEN Performed at [facility]    Culture   Final    NO ANAEROBES ISOLATED; CULTURE IN PROGRESS FOR 5 DAYS Performed at [facility]    Report Status PENDING  Incomplete  Fungus Culture with Smear     Status: None (Preliminary result)   Collection Time: 03/06/15  1:38 PM  Result Value Ref Range Status   Specimen Description ABSCESS ARM RIGHT  Final   Special Requests RIGHT ARM MASS PT ON SEPTRA,DOXYCYCLINE,KEFLEX  Final   Fungal Smear   Final    NO YEAST OR FUNGAL ELEMENTS SEEN Performed at [facility]    Culture   Final    CULTURE IN PROGRESS FOR  FOUR WEEKS Performed at [facility]    Report Status PENDING  Incomplete  AFB culture with smear     Status: None (Preliminary result)   Collection Time: 03/06/15  1:38 PM  Result Value Ref Range Status   Specimen Description ABSCESS ARM RIGHT  Final   Special Requests RIGHT ARM MASS PT ON SEPTRA,DOXYCYCLINE,KEFLEX  Final   Acid Fast Smear   Final    NO ACID FAST BACILLI SEEN Performed at [facility]    Culture   Final    CULTURE WILL BE EXAMINED FOR 6 WEEKS BEFORE ISSUING A FINAL REPORT Performed at [facility]    Report Status PENDING  Incomplete  Culture, routine-abscess     Status: None (Preliminary result)   Collection Time: 03/06/15  1:38 PM  Result Value Ref Range Status   Specimen Description ABSCESS ARM RIGHT  Final   Special Requests RIGHT ARM MASS PT ON SEPTRA,DOXYCYCLINE,KEFLEX  Final   Gram Stain   Final    FEW WBC PRESENT,BOTH PMN AND MONONUCLEAR NO SQUAMOUS EPITHELIAL CELLS SEEN NO ORGANISMS SEEN Performed at [facility]    Culture   Final    RARE STAPHYLOCOCCUS AUREUS Note: RIFAMPIN AND GENTAMICIN SHOULD NOT BE USED AS SINGLE DRUGS FOR TREATMENT OF STAPH INFECTIONS. Performed at [facility]    Report Status PENDING  Incomplete    Medical History: Past Medical History  Diagnosis Date  .  Hypertension   . Hyperlipemia   . Chronic pain     neck;lower back  . Multiple gastric ulcers     ruptured gastric ulcer 2001  . Complication of anesthesia     ANESTHESIA DID NOT TAKE EFFECT WITH BACK SURGERY "I FELT THEM CUT"  . PONV (postoperative nausea and vomiting)   . Family history of anesthesia complication     "hard time waking up my dad" many yrs ago  . Arthritis   . Rotator cuff tear     left  . GERD (gastroesophageal reflux disease)   . History of transfusion   . Restless leg syndrome   . Anxiety     Medications:  Scheduled:   Assessment: 64yo male with infection of R-forearm x 6 wks and failing  Septra, doxycycline, and Cephalexin. No cx have been taken during this time. Pt is now s/p I&D with intra-operative cx (p) from 6/16. He is presenting to ED for Cubicin dose today, which has received previously in Short Stay. I have verified with RN that he is here for 1 dose and then is going home, as Short Stay is closed today.  Cr 1.2 on 6/16, est CrCl ~66ml/min  Goal of Therapy:  Treatment of infection  Plan:  Cubicin 4mg /kg IV x 1  Gracy Bruins, PharmD Clinical Pharmacist [facility]

## 2015-03-09 NOTE — ED Notes (Signed)
Pt here for IV antibiotics. Pt usually goes to short stay.

## 2015-03-09 NOTE — ED Notes (Signed)
Pt had surgery on right arm for abscess on his arm. He usually goes to short stay to receive IV abx called Cubicin but short stay is closed today and they were instructed to come here.

## 2015-03-09 NOTE — ED Provider Notes (Signed)
CSN: [id number]     Arrival date & time 03/09/15  1010 History   First MD Initiated Contact with Patient 03/09/15 1159     Chief Complaint  Patient presents with  . IV Medication     HPI As the emergency department with a skin infection of his right upper extremity.  He is currently receiving IV antibiotics as an outpatient.  He is receiving daptomycin.  He normally gets a short stay to receive his IV antibiotic but they're closed on the weekend therefore he was instructed to come to the emergency department for 4 mg/kg daptomycin dose.  He has no complaints at this time.  No fevers or chills.  He feels well.   Past Medical History  Diagnosis Date  . Hypertension   . Hyperlipemia   . Chronic pain     neck;lower back  . Multiple gastric ulcers     ruptured gastric ulcer 2001  . Complication of anesthesia     ANESTHESIA DID NOT TAKE EFFECT WITH BACK SURGERY "I FELT THEM CUT"  . PONV (postoperative nausea and vomiting)   . Family history of anesthesia complication     "hard time waking up my dad" many yrs ago  . Arthritis   . Rotator cuff tear     left  . GERD (gastroesophageal reflux disease)   . History of transfusion   . Restless leg syndrome   . Anxiety    Past Surgical History  Procedure Laterality Date  . Neck surgery  2012    2  total RODS / PLATES IN NECK  . Total knee arthroplasty Left   . Joint replacement    . Back surgery      5 back  . Rotator cuff repair Bilateral   . Hip surgery      LEFT - TORN TENDON  . Shoulder open rotator cuff repair Left 07/11/2014    Procedure: ROTATOR CUFF REPAIR SHOULDER OPEN WITH  GRAFT ;  Surgeon: Tobi Bastos, MD;  Location: [facility];  Service: Orthopedics;  Laterality: Left;  . Incision and drainage abscess Right 03/06/2015    Procedure: INCISION AND DRAINAGE ABSCESS RIGHT ARM;  Surgeon: Daryll Brod, MD;  Location: [facility];  Service: Orthopedics;  Laterality: Right;   Family History  Problem Relation Age of  Onset  . Heart failure Father   . Diabetes Father   . Hypertension Father   . Anxiety disorder Mother     also tended to overdose on anxiety medication   History  Substance Use Topics  . Smoking status: Former Smoker    Types: Cigarettes    Quit date: 10/09/1998  . Smokeless tobacco: Not on file  . Alcohol Use: No    Review of Systems  Constitutional: Negative for fever and fatigue.      Allergies  Morphine and related and Aspirin  Home Medications   Prior to Admission medications   Medication Sig Start Date End Date Taking? Authorizing Provider  clonazePAM (KLONOPIN) 0.5 MG tablet Take 0.5 mg by mouth 2 (two) times daily as needed for anxiety.   Yes Historical Provider, MD  gabapentin (NEURONTIN) 300 MG capsule Take 300 mg by mouth 3 (three) times daily.   Yes Historical Provider, MD  lisinopril (PRINIVIL,ZESTRIL) 10 MG tablet Take 10 mg by mouth at bedtime.    Yes Historical Provider, MD  Loratadine-Pseudoephedrine (CLARITIN-D 12 HOUR PO) Take 1 tablet by mouth every morning.   Yes Historical Provider, MD  oxyCODONE-acetaminophen (  PERCOCET) 10-325 MG per tablet Take 1 tablet by mouth every 4 (four) hours as needed for pain. 03/06/15  Yes Daryll Brod, MD  pantoprazole (PROTONIX) 20 MG tablet Take 20 mg by mouth daily.   Yes Historical Provider, MD  rOPINIRole (REQUIP) 1 MG tablet Take 1 mg by mouth 2 (two) times daily.    Yes Historical Provider, MD  simvastatin (ZOCOR) 40 MG tablet Take 40 mg by mouth at bedtime.   Yes Historical Provider, MD  sulfamethoxazole-trimethoprim (BACTRIM) 400-80 MG per tablet Take 1 tablet by mouth 2 (two) times daily. 03/06/15  Yes Daryll Brod, MD  tetrahydrozoline 0.05 % ophthalmic solution Place 1 drop into both eyes 3 (three) times daily as needed (dry eyes).   Yes Historical Provider, MD  cyclobenzaprine (FLEXERIL) 10 MG tablet Take 1 tablet (10 mg total) by mouth 3 (three) times daily as needed for muscle spasms. Patient not taking: Reported on  03/08/2015 07/11/14   Ardeen Jourdain, PA-C  ondansetron (ZOFRAN) 4 MG tablet Take 1 tablet (4 mg total) by mouth every 6 (six) hours. Patient not taking: Reported on 03/08/2015 10/05/14   Serita Grit, MD  oxyCODONE-acetaminophen (PERCOCET/ROXICET) 5-325 MG per tablet Take 1-2 tablets by mouth every 4 (four) hours as needed for moderate pain. Patient not taking: Reported on 03/08/2015 07/11/14   Amber Constable, PA-C   BP 142/75 mmHg  Pulse 84  Temp(Src) 98.4 F (36.9 C) (Oral)  Resp 16  Ht 5\' 10"  (1.778 m)  Wt 231 lb (104.781 kg)  BMI 33.15 kg/m2  SpO2 95% Physical Exam  Constitutional: He is oriented to person, place, and time. He appears well-developed and well-nourished.  HENT:  Head: Normocephalic.  Eyes: EOM are normal.  Neck: Normal range of motion.  Pulmonary/Chest: Effort normal.  Abdominal: He exhibits no distension.  Musculoskeletal: Normal range of motion.  Neurological: He is alert and oriented to person, place, and time.  Psychiatric: He has a normal mood and affect.  Nursing note and vitals reviewed.   ED Course  Procedures (including critical care time) Labs Review Labs Reviewed - No data to display  Imaging Review No results found.   EKG Interpretation None      MDM   Final diagnoses:  Skin infection       Jola Schmidt, MD 03/09/15 1506

## 2015-03-10 ENCOUNTER — Encounter (HOSPITAL_COMMUNITY)
Admission: RE | Admit: 2015-03-10 | Discharge: 2015-03-10 | Disposition: A | Discharge status: Home / Self Care | Payer: Medicare Other | Source: Ambulatory Visit | Attending: Orthopedic Surgery | Admitting: Orthopedic Surgery

## 2015-03-10 DIAGNOSIS — L02419 Cutaneous abscess of limb, unspecified: Secondary | ICD-10-CM | POA: Diagnosis not present

## 2015-03-10 LAB — CULTURE, ROUTINE-ABSCESS

## 2015-03-10 MED ORDER — SODIUM CHLORIDE 0.9 % IV SOLN
400.0000 mg | INTRAVENOUS | Status: DC
  Administered 2015-03-10: 400 mg via INTRAVENOUS
  Filled 2015-03-10: qty 8

## 2015-03-11 ENCOUNTER — Encounter (HOSPITAL_COMMUNITY)
Admission: RE | Admit: 2015-03-11 | Discharge: 2015-03-11 | Disposition: A | Discharge status: Home / Self Care | Payer: Medicare Other | Source: Ambulatory Visit | Attending: Orthopedic Surgery | Admitting: Orthopedic Surgery

## 2015-03-11 DIAGNOSIS — L02419 Cutaneous abscess of limb, unspecified: Secondary | ICD-10-CM | POA: Diagnosis not present

## 2015-03-11 LAB — ANAEROBIC CULTURE

## 2015-03-11 MED ORDER — SODIUM CHLORIDE 0.9 % IV SOLN
4.0000 mg/kg | INTRAVENOUS | Status: DC
  Administered 2015-03-11: 419 mg via INTRAVENOUS
  Filled 2015-03-11: qty 8.38

## 2015-03-12 ENCOUNTER — Encounter (HOSPITAL_COMMUNITY)
Admission: RE | Admit: 2015-03-12 | Discharge: 2015-03-12 | Disposition: A | Discharge status: Home / Self Care | Payer: Medicare Other | Source: Ambulatory Visit | Attending: Orthopedic Surgery | Admitting: Orthopedic Surgery

## 2015-03-12 ENCOUNTER — Other Ambulatory Visit (HOSPITAL_COMMUNITY): Payer: Self-pay | Admitting: *Deleted

## 2015-03-12 DIAGNOSIS — L02419 Cutaneous abscess of limb, unspecified: Secondary | ICD-10-CM | POA: Diagnosis not present

## 2015-03-12 MED ORDER — SODIUM CHLORIDE 0.9 % IV SOLN
4.0000 mg/kg | INTRAVENOUS | Status: DC
  Administered 2015-03-12: 419 mg via INTRAVENOUS
  Filled 2015-03-12: qty 8.38

## 2015-03-13 ENCOUNTER — Encounter (HOSPITAL_COMMUNITY)
Admission: RE | Admit: 2015-03-13 | Discharge: 2015-03-13 | Disposition: A | Discharge status: Home / Self Care | Payer: Medicare Other | Source: Ambulatory Visit | Attending: Orthopedic Surgery | Admitting: Orthopedic Surgery

## 2015-03-13 DIAGNOSIS — L02419 Cutaneous abscess of limb, unspecified: Secondary | ICD-10-CM | POA: Diagnosis not present

## 2015-03-13 MED ORDER — SODIUM CHLORIDE 0.9 % IV SOLN
4.0000 mg/kg | INTRAVENOUS | Status: DC
  Administered 2015-03-13: 419 mg via INTRAVENOUS
  Filled 2015-03-13 (×3): qty 8.38

## 2015-03-14 ENCOUNTER — Encounter (HOSPITAL_COMMUNITY)
Admission: RE | Admit: 2015-03-14 | Discharge: 2015-03-14 | Disposition: A | Discharge status: Home / Self Care | Payer: Medicare Other | Source: Ambulatory Visit | Attending: Orthopedic Surgery | Admitting: Orthopedic Surgery

## 2015-03-14 DIAGNOSIS — L02419 Cutaneous abscess of limb, unspecified: Secondary | ICD-10-CM | POA: Diagnosis not present

## 2015-03-14 MED ORDER — SODIUM CHLORIDE 0.9 % IV SOLN
4.0000 mg/kg | INTRAVENOUS | Status: DC
  Administered 2015-03-14: 419 mg via INTRAVENOUS
  Filled 2015-03-14: qty 8.38

## 2015-03-15 ENCOUNTER — Encounter (HOSPITAL_COMMUNITY): Payer: Self-pay | Admitting: *Deleted

## 2015-03-15 ENCOUNTER — Emergency Department (HOSPITAL_COMMUNITY)
Admission: EM | Admit: 2015-03-15 | Discharge: 2015-03-15 | Disposition: A | Discharge status: Home / Self Care | Payer: Medicare Other | Attending: Emergency Medicine | Admitting: Emergency Medicine

## 2015-03-15 DIAGNOSIS — F419 Anxiety disorder, unspecified: Secondary | ICD-10-CM | POA: Diagnosis not present

## 2015-03-15 DIAGNOSIS — I10 Essential (primary) hypertension: Secondary | ICD-10-CM | POA: Diagnosis not present

## 2015-03-15 DIAGNOSIS — Z79899 Other long term (current) drug therapy: Secondary | ICD-10-CM | POA: Insufficient documentation

## 2015-03-15 DIAGNOSIS — L089 Local infection of the skin and subcutaneous tissue, unspecified: Secondary | ICD-10-CM | POA: Diagnosis present

## 2015-03-15 DIAGNOSIS — M1990 Unspecified osteoarthritis, unspecified site: Secondary | ICD-10-CM | POA: Diagnosis not present

## 2015-03-15 DIAGNOSIS — E785 Hyperlipidemia, unspecified: Secondary | ICD-10-CM | POA: Insufficient documentation

## 2015-03-15 DIAGNOSIS — Z87891 Personal history of nicotine dependence: Secondary | ICD-10-CM | POA: Diagnosis not present

## 2015-03-15 DIAGNOSIS — K219 Gastro-esophageal reflux disease without esophagitis: Secondary | ICD-10-CM | POA: Insufficient documentation

## 2015-03-15 DIAGNOSIS — G8929 Other chronic pain: Secondary | ICD-10-CM | POA: Diagnosis not present

## 2015-03-15 MED ORDER — DAPTOMYCIN 500 MG IV SOLR
410.0000 mg | Freq: Once | INTRAVENOUS | Status: AC
  Administered 2015-03-15: 410 mg via INTRAVENOUS
  Filled 2015-03-15: qty 8.2

## 2015-03-15 NOTE — Progress Notes (Signed)
Familiar with patient from Short Stay visits.  To get daptomycin infusion in ED today (6/25) and tomorrow (6/26) per plans from short stay orders.  Dose of Daptomycin is 410mg  (4mg /kg)    Ander Purpura D. Thereasa Iannello, PharmD, BCPS Clinical Pharmacist Pager: [phone] 03/15/2015 10:12 AM

## 2015-03-15 NOTE — ED Notes (Signed)
Pt arrived via POV for IV abx.  Pt had surgery on right arm for abscess and has to come get abx daily per pt.  Pt a x 4, NAD.  Denies fevers.

## 2015-03-15 NOTE — ED Provider Notes (Addendum)
CSN: [id number]     Arrival date & time 03/15/15  9528 History   First MD Initiated Contact with Patient 03/15/15 [phone]     Chief Complaint  Patient presents with  . IV Medication     (Consider location/radiation/quality/duration/timing/severity/associated sxs/prior Treatment) HPI Comments: Pt with hx of soft tissue infection of the right arm presenting for Daptomycin dose for  Today.  Pt is on bactrim and daptomycin for infection.  Pt and wife state the wound is healing and deny and fever, redness, drainage or other sx at this time.  The history is provided by the patient and the spouse.    Past Medical History  Diagnosis Date  . Hypertension   . Hyperlipemia   . Chronic pain     neck;lower back  . Multiple gastric ulcers     ruptured gastric ulcer 2001  . Complication of anesthesia     ANESTHESIA DID NOT TAKE EFFECT WITH BACK SURGERY "I FELT THEM CUT"  . PONV (postoperative nausea and vomiting)   . Family history of anesthesia complication     "hard time waking up my dad" many yrs ago  . Arthritis   . Rotator cuff tear     left  . GERD (gastroesophageal reflux disease)   . History of transfusion   . Restless leg syndrome   . Anxiety    Past Surgical History  Procedure Laterality Date  . Neck surgery  2012    2  total RODS / PLATES IN NECK  . Total knee arthroplasty Left   . Joint replacement    . Back surgery      5 back  . Rotator cuff repair Bilateral   . Hip surgery      LEFT - TORN TENDON  . Shoulder open rotator cuff repair Left 07/11/2014    Procedure: ROTATOR CUFF REPAIR SHOULDER OPEN WITH  GRAFT ;  Surgeon: Tobi Bastos, MD;  Location: [facility];  Service: Orthopedics;  Laterality: Left;  . Incision and drainage abscess Right 03/06/2015    Procedure: INCISION AND DRAINAGE ABSCESS RIGHT ARM;  Surgeon: Daryll Brod, MD;  Location: [place];  Service: Orthopedics;  Laterality: Right;   Family History  Problem Relation Age of Onset  . Heart  failure Father   . Diabetes Father   . Hypertension Father   . Anxiety disorder Mother     also tended to overdose on anxiety medication   History  Substance Use Topics  . Smoking status: Former Smoker    Types: Cigarettes    Quit date: 10/09/1998  . Smokeless tobacco: Not on file  . Alcohol Use: No    Review of Systems  All other systems reviewed and are negative.     Allergies  Morphine and related and Aspirin  Home Medications   Prior to Admission medications   Medication Sig Start Date End Date Taking? Authorizing Provider  clonazePAM (KLONOPIN) 0.5 MG tablet Take 0.5 mg by mouth 2 (two) times daily as needed for anxiety.    Historical Provider, MD  cyclobenzaprine (FLEXERIL) 10 MG tablet Take 1 tablet (10 mg total) by mouth 3 (three) times daily as needed for muscle spasms. Patient not taking: Reported on 03/08/2015 07/11/14   Ardeen Jourdain, PA-C  gabapentin (NEURONTIN) 300 MG capsule Take 300 mg by mouth 3 (three) times daily.    Historical Provider, MD  lisinopril (PRINIVIL,ZESTRIL) 10 MG tablet Take 10 mg by mouth at bedtime.     Historical Provider,  MD  Loratadine-Pseudoephedrine (CLARITIN-D 12 HOUR PO) Take 1 tablet by mouth every morning.    Historical Provider, MD  ondansetron (ZOFRAN) 4 MG tablet Take 1 tablet (4 mg total) by mouth every 6 (six) hours. Patient not taking: Reported on 03/08/2015 10/05/14   Serita Grit, MD  oxyCODONE-acetaminophen (PERCOCET) 10-325 MG per tablet Take 1 tablet by mouth every 4 (four) hours as needed for pain. 03/06/15   Daryll Brod, MD  oxyCODONE-acetaminophen (PERCOCET/ROXICET) 5-325 MG per tablet Take 1-2 tablets by mouth every 4 (four) hours as needed for moderate pain. Patient not taking: Reported on 03/08/2015 07/11/14   Ardeen Jourdain, PA-C  pantoprazole (PROTONIX) 20 MG tablet Take 20 mg by mouth daily.    Historical Provider, MD  rOPINIRole (REQUIP) 1 MG tablet Take 1 mg by mouth 2 (two) times daily.     Historical Provider, MD   simvastatin (ZOCOR) 40 MG tablet Take 40 mg by mouth at bedtime.    Historical Provider, MD  sulfamethoxazole-trimethoprim (BACTRIM) 400-80 MG per tablet Take 1 tablet by mouth 2 (two) times daily. 03/06/15   Daryll Brod, MD  tetrahydrozoline 0.05 % ophthalmic solution Place 1 drop into both eyes 3 (three) times daily as needed (dry eyes).    Historical Provider, MD   There were no vitals taken for this visit. Physical Exam  Constitutional: He is oriented to person, place, and time. He appears well-developed and well-nourished. No distress.  HENT:  Head: Normocephalic and atraumatic.  Eyes: EOM are normal. Pupils are equal, round, and reactive to light.  Cardiovascular: Normal rate.   Pulmonary/Chest: Effort normal.  Musculoskeletal:  Right arm bandage without drainage or exudate on the bandage.  Good mobility and color in the hand  Neurological: He is alert and oriented to person, place, and time.  Skin: Skin is warm and dry.  Psychiatric: He has a normal mood and affect. His behavior is normal.  Nursing note and vitals reviewed.   ED Course  Procedures (including critical care time) Labs Review Labs Reviewed - No data to display  Imaging Review No results found.   EKG Interpretation None      MDM   Final diagnoses:  Soft tissue infection    Patient presenting here for his dose of IV daptomycin because short stays closed today. He is getting this antibiotic for a right arm infection.  Patient has no other complaints at this time.  Last dose was yesterday.    Blanchie Dessert, MD 03/15/15 [place], MD 03/15/15 [phone]

## 2015-03-16 ENCOUNTER — Encounter (HOSPITAL_COMMUNITY): Payer: Self-pay

## 2015-03-16 ENCOUNTER — Emergency Department (HOSPITAL_COMMUNITY)
Admission: EM | Admit: 2015-03-16 | Discharge: 2015-03-16 | Disposition: A | Discharge status: Home / Self Care | Payer: Medicare Other | Attending: Emergency Medicine | Admitting: Emergency Medicine

## 2015-03-16 DIAGNOSIS — S51801A Unspecified open wound of right forearm, initial encounter: Secondary | ICD-10-CM | POA: Insufficient documentation

## 2015-03-16 DIAGNOSIS — Y999 Unspecified external cause status: Secondary | ICD-10-CM | POA: Insufficient documentation

## 2015-03-16 DIAGNOSIS — Z79899 Other long term (current) drug therapy: Secondary | ICD-10-CM | POA: Diagnosis not present

## 2015-03-16 DIAGNOSIS — G2581 Restless legs syndrome: Secondary | ICD-10-CM | POA: Diagnosis not present

## 2015-03-16 DIAGNOSIS — Y939 Activity, unspecified: Secondary | ICD-10-CM | POA: Insufficient documentation

## 2015-03-16 DIAGNOSIS — X58XXXA Exposure to other specified factors, initial encounter: Secondary | ICD-10-CM | POA: Diagnosis not present

## 2015-03-16 DIAGNOSIS — Z87891 Personal history of nicotine dependence: Secondary | ICD-10-CM | POA: Diagnosis not present

## 2015-03-16 DIAGNOSIS — Y929 Unspecified place or not applicable: Secondary | ICD-10-CM | POA: Diagnosis not present

## 2015-03-16 DIAGNOSIS — S59911A Unspecified injury of right forearm, initial encounter: Secondary | ICD-10-CM | POA: Diagnosis present

## 2015-03-16 DIAGNOSIS — I10 Essential (primary) hypertension: Secondary | ICD-10-CM | POA: Insufficient documentation

## 2015-03-16 DIAGNOSIS — M1990 Unspecified osteoarthritis, unspecified site: Secondary | ICD-10-CM | POA: Insufficient documentation

## 2015-03-16 DIAGNOSIS — E785 Hyperlipidemia, unspecified: Secondary | ICD-10-CM | POA: Diagnosis not present

## 2015-03-16 DIAGNOSIS — K219 Gastro-esophageal reflux disease without esophagitis: Secondary | ICD-10-CM | POA: Diagnosis not present

## 2015-03-16 DIAGNOSIS — F419 Anxiety disorder, unspecified: Secondary | ICD-10-CM | POA: Diagnosis not present

## 2015-03-16 DIAGNOSIS — T148XXA Other injury of unspecified body region, initial encounter: Secondary | ICD-10-CM

## 2015-03-16 DIAGNOSIS — G8929 Other chronic pain: Secondary | ICD-10-CM | POA: Insufficient documentation

## 2015-03-16 MED ORDER — SODIUM CHLORIDE 0.9 % IV SOLN
410.0000 mg | Freq: Once | INTRAVENOUS | Status: AC
  Administered 2015-03-16: 410 mg via INTRAVENOUS
  Filled 2015-03-16: qty 8.2

## 2015-03-16 NOTE — ED Provider Notes (Signed)
CSN: [id number]     Arrival date & time 03/16/15  1032 History   First MD Initiated Contact with Patient 03/16/15 1041     Chief Complaint  Patient presents with  . IV Medication     (Consider location/radiation/quality/duration/timing/severity/associated sxs/prior Treatment) HPI Comments: The patient is a 64 year old male who has a history of a right arm infection which has required a surgical procedure, he is currently getting IV at about exam every day, daptomycin. He is tolerating the medications well. He is unable to go to the clinic to get this today so he comes to the emergency department. He has no systemic symptoms, no complaints other than arm pain which is a chronic finding since he had the infection. He does not state that it is getting worse, it is unchanged, he has no other complaints  The history is provided by the patient.    Past Medical History  Diagnosis Date  . Hypertension   . Hyperlipemia   . Chronic pain     neck;lower back  . Multiple gastric ulcers     ruptured gastric ulcer 2001  . Complication of anesthesia     ANESTHESIA DID NOT TAKE EFFECT WITH BACK SURGERY "I FELT THEM CUT"  . PONV (postoperative nausea and vomiting)   . Family history of anesthesia complication     "hard time waking up my dad" many yrs ago  . Arthritis   . Rotator cuff tear     left  . GERD (gastroesophageal reflux disease)   . History of transfusion   . Restless leg syndrome   . Anxiety    Past Surgical History  Procedure Laterality Date  . Neck surgery  2012    2  total RODS / PLATES IN NECK  . Total knee arthroplasty Left   . Joint replacement    . Back surgery      5 back  . Rotator cuff repair Bilateral   . Hip surgery      LEFT - TORN TENDON  . Shoulder open rotator cuff repair Left 07/11/2014    Procedure: ROTATOR CUFF REPAIR SHOULDER OPEN WITH  GRAFT ;  Surgeon: Tobi Bastos, MD;  Location: [facility];  Service: Orthopedics;  Laterality: Left;  . Incision and  drainage abscess Right 03/06/2015    Procedure: INCISION AND DRAINAGE ABSCESS RIGHT ARM;  Surgeon: Daryll Brod, MD;  Location: [place];  Service: Orthopedics;  Laterality: Right;   Family History  Problem Relation Age of Onset  . Heart failure Father   . Diabetes Father   . Hypertension Father   . Anxiety disorder Mother     also tended to overdose on anxiety medication   History  Substance Use Topics  . Smoking status: Former Smoker    Types: Cigarettes    Quit date: 10/09/1998  . Smokeless tobacco: Not on file  . Alcohol Use: No    Review of Systems  Constitutional: Negative for fever.  Skin: Positive for wound.      Allergies  Morphine and related and Aspirin  Home Medications   Prior to Admission medications   Medication Sig Start Date End Date Taking? Authorizing Provider  clonazePAM (KLONOPIN) 0.5 MG tablet Take 0.5 mg by mouth 2 (two) times daily as needed for anxiety.   Yes Historical Provider, MD  gabapentin (NEURONTIN) 300 MG capsule Take 300 mg by mouth 3 (three) times daily.   Yes Historical Provider, MD  lisinopril (PRINIVIL,ZESTRIL) 10 MG tablet Take  10 mg by mouth at bedtime.    Yes Historical Provider, MD  Loratadine-Pseudoephedrine (CLARITIN-D 12 HOUR PO) Take 1 tablet by mouth every morning.   Yes Historical Provider, MD  oxyCODONE-acetaminophen (PERCOCET) 10-325 MG per tablet Take 1 tablet by mouth every 4 (four) hours as needed for pain. 03/06/15  Yes Daryll Brod, MD  pantoprazole (PROTONIX) 20 MG tablet Take 20 mg by mouth daily.   Yes Historical Provider, MD  rOPINIRole (REQUIP) 1 MG tablet Take 1 mg by mouth 2 (two) times daily.    Yes Historical Provider, MD  simvastatin (ZOCOR) 40 MG tablet Take 40 mg by mouth at bedtime.   Yes Historical Provider, MD  sulfamethoxazole-trimethoprim (BACTRIM) 400-80 MG per tablet Take 1 tablet by mouth 2 (two) times daily. 03/06/15  Yes Daryll Brod, MD  tetrahydrozoline 0.05 % ophthalmic solution Place 1  drop into both eyes 3 (three) times daily as needed (dry eyes).   Yes Historical Provider, MD  cyclobenzaprine (FLEXERIL) 10 MG tablet Take 1 tablet (10 mg total) by mouth 3 (three) times daily as needed for muscle spasms. Patient not taking: Reported on 03/08/2015 07/11/14   Ardeen Jourdain, PA-C  ondansetron (ZOFRAN) 4 MG tablet Take 1 tablet (4 mg total) by mouth every 6 (six) hours. Patient not taking: Reported on 03/08/2015 10/05/14   Serita Grit, MD  oxyCODONE-acetaminophen (PERCOCET/ROXICET) 5-325 MG per tablet Take 1-2 tablets by mouth every 4 (four) hours as needed for moderate pain. Patient not taking: Reported on 03/08/2015 07/11/14   Amber Constable, PA-C   BP 132/74 mmHg  Pulse 78  Temp(Src) 98.8 F (37.1 C) (Oral)  Resp 18  SpO2 93% Physical Exam  Constitutional: He appears well-developed and well-nourished. No distress.  HENT:  Head: Normocephalic.  Eyes: Conjunctivae are normal. No scleral icterus.  Cardiovascular: Normal rate and regular rhythm.   Pulmonary/Chest: Effort normal and breath sounds normal.  Musculoskeletal: Normal range of motion. He exhibits tenderness ( ttp over the open wound of the right forearm). He exhibits no edema.  Neurological: He is alert. Coordination normal.  Sensation and motor intact  Skin: Skin is warm and dry. He is not diaphoretic.  Wound inspected after dressings have been taken down, there is good granulation tissue, no purulence, no foul smell, no drainage, no surrounding erythema or induration, compartments are soft    ED Course  Procedures (including critical care time) Labs Review Labs Reviewed - No data to display  Imaging Review No results found.    MDM   Final diagnoses:  Open wound    The patient has normal neurologic function and vascular perfusion distal to his forearm injury. It appears to be healing, vital signs show no fever, he is stable for discharge after at about it. He will follow up tomorrow in the clinic  to find out if he needs to go further with an T biotics  Antibiotics given, stable for d/c.  Filed Vitals:   03/16/15 1155 03/16/15 1200 03/16/15 1230 03/16/15 1315  BP: 118/81 125/75 122/79 132/74  Pulse: 75 72 70 78  Temp:      TempSrc:      Resp: 18  20 18   SpO2: 94% 95% 95% 93%     Noemi Chapel, MD 03/16/15 1324

## 2015-03-16 NOTE — Progress Notes (Signed)
Familiar with patient from Short Stay visits.  Received daptomycin infusion in ED yesterday (6/25). To get another dose today (6/26) per plans from short stay orders.  Dose of Daptomycin is 410mg  (4mg /kg)   Ander Purpura D. Kristian Mogg, PharmD, BCPS Clinical Pharmacist Pager: [phone] 03/16/2015 11:06 AM

## 2015-03-16 NOTE — ED Notes (Signed)
Pt. Presents for IV abx for recent surgical abcess. Pt. AxO x4. Surgical incision dressed and cleaned.

## 2015-03-17 ENCOUNTER — Encounter (HOSPITAL_COMMUNITY)
Admission: RE | Admit: 2015-03-17 | Discharge: 2015-03-17 | Disposition: A | Discharge status: Home / Self Care | Payer: Medicare Other | Source: Ambulatory Visit | Attending: Orthopedic Surgery | Admitting: Orthopedic Surgery

## 2015-03-17 DIAGNOSIS — L02419 Cutaneous abscess of limb, unspecified: Secondary | ICD-10-CM | POA: Diagnosis not present

## 2015-03-17 MED ORDER — SODIUM CHLORIDE 0.9 % IV SOLN
4.0000 mg/kg | INTRAVENOUS | Status: DC
  Administered 2015-03-17: 419 mg via INTRAVENOUS
  Filled 2015-03-17: qty 8.38

## 2015-04-03 LAB — FUNGUS CULTURE W SMEAR: Fungal Smear: NONE SEEN

## 2015-04-18 LAB — AFB CULTURE WITH SMEAR (NOT AT ARMC): Acid Fast Smear: NONE SEEN

## 2015-05-02 ENCOUNTER — Encounter (HOSPITAL_COMMUNITY): Payer: Self-pay | Admitting: Emergency Medicine

## 2015-05-02 ENCOUNTER — Emergency Department (HOSPITAL_COMMUNITY)
Admission: EM | Admit: 2015-05-02 | Discharge: 2015-05-02 | Disposition: A | Discharge status: Home / Self Care | Payer: Medicare Other | Attending: Emergency Medicine | Admitting: Emergency Medicine

## 2015-05-02 DIAGNOSIS — E785 Hyperlipidemia, unspecified: Secondary | ICD-10-CM | POA: Diagnosis not present

## 2015-05-02 DIAGNOSIS — F419 Anxiety disorder, unspecified: Secondary | ICD-10-CM | POA: Diagnosis not present

## 2015-05-02 DIAGNOSIS — G8929 Other chronic pain: Secondary | ICD-10-CM | POA: Diagnosis not present

## 2015-05-02 DIAGNOSIS — I10 Essential (primary) hypertension: Secondary | ICD-10-CM | POA: Insufficient documentation

## 2015-05-02 DIAGNOSIS — K219 Gastro-esophageal reflux disease without esophagitis: Secondary | ICD-10-CM | POA: Insufficient documentation

## 2015-05-02 DIAGNOSIS — R1030 Lower abdominal pain, unspecified: Secondary | ICD-10-CM | POA: Diagnosis not present

## 2015-05-02 DIAGNOSIS — R339 Retention of urine, unspecified: Secondary | ICD-10-CM | POA: Insufficient documentation

## 2015-05-02 DIAGNOSIS — Z79899 Other long term (current) drug therapy: Secondary | ICD-10-CM | POA: Diagnosis not present

## 2015-05-02 DIAGNOSIS — R300 Dysuria: Secondary | ICD-10-CM | POA: Diagnosis present

## 2015-05-02 DIAGNOSIS — G2581 Restless legs syndrome: Secondary | ICD-10-CM | POA: Diagnosis not present

## 2015-05-02 DIAGNOSIS — M1990 Unspecified osteoarthritis, unspecified site: Secondary | ICD-10-CM | POA: Diagnosis not present

## 2015-05-02 DIAGNOSIS — Z87891 Personal history of nicotine dependence: Secondary | ICD-10-CM | POA: Insufficient documentation

## 2015-05-02 DIAGNOSIS — R338 Other retention of urine: Secondary | ICD-10-CM

## 2015-05-02 LAB — URINALYSIS, ROUTINE W REFLEX MICROSCOPIC
BILIRUBIN URINE: NEGATIVE
Glucose, UA: NEGATIVE mg/dL
KETONES UR: NEGATIVE mg/dL
Leukocytes, UA: NEGATIVE
Nitrite: NEGATIVE
Protein, ur: 30 mg/dL — AB
Specific Gravity, Urine: 1.018 (ref 1.005–1.030)
Urobilinogen, UA: 0.2 mg/dL (ref 0.0–1.0)
pH: 5.5 (ref 5.0–8.0)

## 2015-05-02 LAB — BASIC METABOLIC PANEL
Anion gap: 10 (ref 5–15)
BUN: 17 mg/dL (ref 6–20)
CALCIUM: 9.3 mg/dL (ref 8.9–10.3)
CO2: 23 mmol/L (ref 22–32)
CREATININE: 1.13 mg/dL (ref 0.61–1.24)
Chloride: 107 mmol/L (ref 101–111)
GFR calc Af Amer: >60 mL/min (ref >60)
GFR calc non Af Amer: >60 mL/min (ref >60)
GLUCOSE: 104 mg/dL — AB (ref 65–99)
Potassium: 4.1 mmol/L (ref 3.5–5.1)
SODIUM: 140 mmol/L (ref 135–145)

## 2015-05-02 LAB — CBC WITH DIFFERENTIAL/PLATELET
Basophils Absolute: 0 10*3/uL (ref 0.0–0.1)
Basophils Relative: 0 % (ref 0–1)
Eosinophils Absolute: 0.1 10*3/uL (ref 0.0–0.7)
Eosinophils Relative: 1 % (ref 0–5)
HCT: 41.7 % (ref 39.0–52.0)
HEMOGLOBIN: 13.4 g/dL (ref 13.0–17.0)
LYMPHS PCT: 12 % (ref 12–46)
Lymphs Abs: 0.9 10*3/uL (ref 0.7–4.0)
MCH: 30.2 pg (ref 26.0–34.0)
MCHC: 32.1 g/dL (ref 30.0–36.0)
MCV: 94.1 fL (ref 78.0–100.0)
MONOS PCT: 4 % (ref 3–12)
Monocytes Absolute: 0.3 10*3/uL (ref 0.1–1.0)
NEUTROS ABS: 6.1 10*3/uL (ref 1.7–7.7)
NEUTROS PCT: 83 % — AB (ref 43–77)
Platelets: 175 10*3/uL (ref 150–400)
RBC: 4.43 MIL/uL (ref 4.22–5.81)
RDW: 15.2 % (ref 11.5–15.5)
WBC: 7.3 10*3/uL (ref 4.0–10.5)

## 2015-05-02 LAB — URINE MICROSCOPIC-ADD ON

## 2015-05-02 NOTE — Discharge Instructions (Signed)
Acute Urinary Retention °Acute urinary retention is the temporary inability to urinate. °This is a common problem in older men. As men age their prostates become larger and block the flow of urine from the bladder. This is usually a problem that has come on gradually.  °HOME CARE INSTRUCTIONS °If you are sent home with a Foley catheter and a drainage system, you will need to discuss the best course of action with your health care provider. While the catheter is in, maintain a good intake of fluids. Keep the drainage bag emptied and lower than your catheter. This is so that contaminated urine will not flow back into your bladder, which could lead to a urinary tract infection. °There are two main types of drainage bags. One is a large bag that usually is used at night. It has a good capacity that will allow you to sleep through the night without having to empty it. The second type is called a leg bag. It has a smaller capacity, so it needs to be emptied more frequently. However, the main advantage is that it can be attached by a leg strap and can go underneath your clothing, allowing you the freedom to move about or leave your home. °Only take over-the-counter or prescription medicines for pain, discomfort, or fever as directed by your health care provider.  °SEEK MEDICAL CARE IF: °· You develop a low-grade fever. °· You experience spasms or leakage of urine with the spasms. °SEEK IMMEDIATE MEDICAL CARE IF:  °· You develop chills or fever. °· Your catheter stops draining urine. °· Your catheter falls out. °· You start to develop increased bleeding that does not respond to rest and increased fluid intake. °MAKE SURE YOU: °· Understand these instructions. °· Will watch your condition. °· Will get help right away if you are not doing well or get worse. °Document Released: 12/13/2000 Document Revised: 09/11/2013 Document Reviewed: 02/15/2013 °[employer]® Patient Information ©2015 [employer]. This information is not  intended to replace advice given to you by your health care provider. Make sure you discuss any questions you have with your health care provider. ° °

## 2015-05-02 NOTE — ED Notes (Signed)
Pt from home with c/o urge to urinate without being able to void.  Pt reports he last voided at 1-2 am.  Pt reports 8/10 discomfort in his genital region.  Denies change in appearance of urine or other complaints.  Pt in NAD, A&O.

## 2015-05-02 NOTE — ED Provider Notes (Signed)
CSN: [id number]     Arrival date & time 05/02/15  1032 History   First MD Initiated Contact with Patient 05/02/15 1037     Chief Complaint  Patient presents with  . Dysuria      Patient is a 64 y.o. male presenting with dysuria. The history is provided by the patient. No language interpreter was used.  Dysuria   Mr. Pontillo presents for evaluation of urinary retention. He states that he last voided at 1 in the morning and has been unable to void since that time. He has urge to urinate but cannot pass urine. He has some lower abdominal/genital pain.  He does have some associated nausea. He denies any fevers, vomiting, diarrhea. He has a history of multiple orthopedic problems and hypertension. He denies any history of kidney disease or diabetes. 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 Symptoms are moderate, constant, worsening.   Past Medical History  Diagnosis Date  . Hypertension   . Hyperlipemia   . Chronic pain     neck;lower back  . Multiple gastric ulcers     ruptured gastric ulcer 2001  . Complication of anesthesia     ANESTHESIA DID NOT TAKE EFFECT WITH BACK SURGERY "I FELT THEM CUT"  . PONV (postoperative nausea and vomiting)   . Family history of anesthesia complication     "hard time waking up my dad" many yrs ago  . Arthritis   . Rotator cuff tear     left  . GERD (gastroesophageal reflux disease)   . History of transfusion   . Restless leg syndrome   . Anxiety    Past Surgical History  Procedure Laterality Date  . Neck surgery  2012    2  total RODS / PLATES IN NECK  . Total knee arthroplasty Left   . Joint replacement    . Back surgery      5 back  . Rotator cuff repair Bilateral   . Hip surgery      LEFT - TORN TENDON  . Shoulder open rotator cuff repair Left 07/11/2014    Procedure: ROTATOR CUFF REPAIR SHOULDER OPEN WITH  GRAFT ;  Surgeon: Tobi Bastos, MD;  Location: [facility];  Service: Orthopedics;  Laterality: Left;  . Incision  and drainage abscess Right 03/06/2015    Procedure: INCISION AND DRAINAGE ABSCESS RIGHT ARM;  Surgeon: Daryll Brod, MD;  Location: [place];  Service: Orthopedics;  Laterality: Right;   Family History  Problem Relation Age of Onset  . Heart failure Father   . Diabetes Father   . Hypertension Father   . Anxiety disorder Mother     also tended to overdose on anxiety medication   Social History  Substance Use Topics  . Smoking status: Former Smoker    Types: Cigarettes    Quit date: 10/09/1998  . Smokeless tobacco: None  . Alcohol Use: No    Review of Systems  Genitourinary: Positive for dysuria.  All other systems reviewed and are negative.     Allergies  Morphine and related and Aspirin  Home Medications   Prior to Admission medications   Medication Sig Start Date End Date Taking? Authorizing Provider  clonazePAM (KLONOPIN) 0.5 MG tablet Take 0.5 mg by mouth 2 (two) times daily as needed for anxiety.    Historical Provider, MD  cyclobenzaprine (FLEXERIL) 10 MG tablet Take 1 tablet (10 mg total) by mouth 3 (three) times daily as needed for  muscle spasms. Patient not taking: Reported on 03/08/2015 07/11/14   Ardeen Jourdain, PA-C  gabapentin (NEURONTIN) 300 MG capsule Take 300 mg by mouth 3 (three) times daily.    Historical Provider, MD  lisinopril (PRINIVIL,ZESTRIL) 10 MG tablet Take 10 mg by mouth at bedtime.     Historical Provider, MD  Loratadine-Pseudoephedrine (CLARITIN-D 12 HOUR PO) Take 1 tablet by mouth every morning.    Historical Provider, MD  ondansetron (ZOFRAN) 4 MG tablet Take 1 tablet (4 mg total) by mouth every 6 (six) hours. Patient not taking: Reported on 03/08/2015 10/05/14   Serita Grit, MD  oxyCODONE-acetaminophen (PERCOCET) 10-325 MG per tablet Take 1 tablet by mouth every 4 (four) hours as needed for pain. 03/06/15   Daryll Brod, MD  oxyCODONE-acetaminophen (PERCOCET/ROXICET) 5-325 MG per tablet Take 1-2 tablets by mouth every 4 (four) hours  as needed for moderate pain. Patient not taking: Reported on 03/08/2015 07/11/14   Ardeen Jourdain, PA-C  pantoprazole (PROTONIX) 20 MG tablet Take 20 mg by mouth daily.    Historical Provider, MD  rOPINIRole (REQUIP) 1 MG tablet Take 1 mg by mouth 2 (two) times daily.     Historical Provider, MD  simvastatin (ZOCOR) 40 MG tablet Take 40 mg by mouth at bedtime.    Historical Provider, MD  sulfamethoxazole-trimethoprim (BACTRIM) 400-80 MG per tablet Take 1 tablet by mouth 2 (two) times daily. 03/06/15   Daryll Brod, MD  tetrahydrozoline 0.05 % ophthalmic solution Place 1 drop into both eyes 3 (three) times daily as needed (dry eyes).    Historical Provider, MD   BP 167/80 mmHg  Pulse 87  Temp(Src) 98.4 F (36.9 C) (Oral)  Resp 14  Ht 6' (1.829 m)  Wt 230 lb (104.327 kg)  BMI 31.19 kg/m2  SpO2 99% Physical Exam  Constitutional: He is oriented to person, place, and time. He appears well-developed and well-nourished.  HENT:  Head: Normocephalic and atraumatic.  Cardiovascular: Normal rate and regular rhythm.   No murmur heard. Pulmonary/Chest: Effort normal and breath sounds normal. No respiratory distress.  Abdominal: Soft. There is no rebound and no guarding.  Mild suprapubic tenderness  Genitourinary: Penis normal. No penile tenderness.  Musculoskeletal: He exhibits no edema or tenderness.  Neurological: He is alert and oriented to person, place, and time.  5 out of 5 strength in bilateral lower extremities, sensation to light touch intact throughout bilateral lower extremities, no saddle anesthesia.  Skin: Skin is warm and dry.  Psychiatric: He has a normal mood and affect. His behavior is normal.  Nursing note and vitals reviewed.   ED Course  Procedures (including critical care time) Labs Review Labs Reviewed  URINALYSIS, ROUTINE W REFLEX MICROSCOPIC (NOT AT [facility]) - Abnormal; Notable for the following:    Hgb urine dipstick TRACE (*)    Protein, ur 30 (*)    All other  components within normal limits  BASIC METABOLIC PANEL - Abnormal; Notable for the following:    Glucose, Bld 104 (*)    All other components within normal limits  CBC WITH DIFFERENTIAL/PLATELET - Abnormal; Notable for the following:    Neutrophils Relative % 83 (*)    All other components within normal limits  URINE MICROSCOPIC-ADD ON - Abnormal; Notable for the following:    Bacteria, UA FEW (*)    All other components within normal limits  URINE CULTURE    Imaging Review No results found. I, Loyce Flaming, personally reviewed and evaluated these images and lab results as part  of my medical decision-making.   EKG Interpretation None      MDM   Final diagnoses:  Acute urinary retention    Patient here for inability to urinate, and out cath in the ED demonstrated 700 mL of urine and bladder. Patient is asymptomatic after and not fully. There is no evidence of acute UTI or acute kidney injury. Patient doesn't have any neurologic deficits on examination and no history concerning for acute spinal cord injury. He is on multiple chronic pain and sedating medicines that could contribute to urinary retention but these are ongoing medications with no recent dose adjustments. Discussed with patient treatment options for urinary retention that include DC home with careful return precautions versus Foley catheter placement. Patient prefers no Foley this time but will return if he has evidence of recurrent urinary retention.    Quintella Reichert, MD 05/02/15 1318

## 2015-05-03 LAB — URINE CULTURE: Culture: NO GROWTH

## 2015-12-31 ENCOUNTER — Emergency Department (HOSPITAL_COMMUNITY)
Admission: EM | Admit: 2015-12-31 | Discharge: 2015-12-31 | Disposition: A | Discharge status: Home / Self Care | Payer: Medicare Other | Attending: Emergency Medicine | Admitting: Emergency Medicine

## 2015-12-31 ENCOUNTER — Encounter (HOSPITAL_COMMUNITY): Payer: Self-pay | Admitting: Emergency Medicine

## 2015-12-31 DIAGNOSIS — K219 Gastro-esophageal reflux disease without esophagitis: Secondary | ICD-10-CM | POA: Insufficient documentation

## 2015-12-31 DIAGNOSIS — G2581 Restless legs syndrome: Secondary | ICD-10-CM | POA: Insufficient documentation

## 2015-12-31 DIAGNOSIS — Z87891 Personal history of nicotine dependence: Secondary | ICD-10-CM | POA: Diagnosis not present

## 2015-12-31 DIAGNOSIS — E785 Hyperlipidemia, unspecified: Secondary | ICD-10-CM | POA: Insufficient documentation

## 2015-12-31 DIAGNOSIS — G8929 Other chronic pain: Secondary | ICD-10-CM | POA: Diagnosis not present

## 2015-12-31 DIAGNOSIS — T50901A Poisoning by unspecified drugs, medicaments and biological substances, accidental (unintentional), initial encounter: Secondary | ICD-10-CM

## 2015-12-31 DIAGNOSIS — Y9289 Other specified places as the place of occurrence of the external cause: Secondary | ICD-10-CM | POA: Insufficient documentation

## 2015-12-31 DIAGNOSIS — T402X1A Poisoning by other opioids, accidental (unintentional), initial encounter: Secondary | ICD-10-CM | POA: Diagnosis present

## 2015-12-31 DIAGNOSIS — M1990 Unspecified osteoarthritis, unspecified site: Secondary | ICD-10-CM | POA: Diagnosis not present

## 2015-12-31 DIAGNOSIS — F419 Anxiety disorder, unspecified: Secondary | ICD-10-CM | POA: Insufficient documentation

## 2015-12-31 DIAGNOSIS — I10 Essential (primary) hypertension: Secondary | ICD-10-CM | POA: Diagnosis not present

## 2015-12-31 DIAGNOSIS — Y998 Other external cause status: Secondary | ICD-10-CM | POA: Diagnosis not present

## 2015-12-31 DIAGNOSIS — Y9389 Activity, other specified: Secondary | ICD-10-CM | POA: Diagnosis not present

## 2015-12-31 DIAGNOSIS — Z79899 Other long term (current) drug therapy: Secondary | ICD-10-CM | POA: Insufficient documentation

## 2015-12-31 LAB — ACETAMINOPHEN LEVEL: Acetaminophen (Tylenol), Serum: 37 ug/mL — ABNORMAL HIGH (ref 10–30)

## 2015-12-31 LAB — CBG MONITORING, ED: Glucose-Capillary: 99 mg/dL (ref 65–99)

## 2015-12-31 NOTE — Discharge Instructions (Signed)

## 2015-12-31 NOTE — ED Notes (Signed)
EMS report initial RR-4 prior to narcan then RR-12 after, CBG-145, BP-126/73, SpO-99% on room.

## 2015-12-31 NOTE — ED Provider Notes (Addendum)
CSN: [id number]     Arrival date & time 12/31/15  2040 History   First MD Initiated Contact with Patient 12/31/15 2051     Chief Complaint  Patient presents with  . Drug Overdose     (Consider location/radiation/quality/duration/timing/severity/associated sxs/prior Treatment) HPI Comments: Patient here after taking an extra dose of his home pain medication. He took 2 tablets of 10 mg hydrocodone tablets. Patient has chronic pain in his right elbow and had it tapped today by his orthopedist and have more pain after the procedure. Denies any fever. Denies any distal numbness or tingling to his right hand. After he took the extra dose of pain meds, he became more sleepy and lethargic. His wife called EMS who found the patient to have sonorous respirations and was given 1 mg of IV Narcan with good response.  Patient is a 65 y.o. male presenting with Overdose. The history is provided by the patient.  Drug Overdose    Past Medical History  Diagnosis Date  . Hypertension   . Hyperlipemia   . Chronic pain     neck;lower back  . Multiple gastric ulcers     ruptured gastric ulcer 2001  . Complication of anesthesia     ANESTHESIA DID NOT TAKE EFFECT WITH BACK SURGERY "I FELT THEM CUT"  . PONV (postoperative nausea and vomiting)   . Family history of anesthesia complication     "hard time waking up my dad" many yrs ago  . Arthritis   . Rotator cuff tear     left  . GERD (gastroesophageal reflux disease)   . History of transfusion   . Restless leg syndrome   . Anxiety    Past Surgical History  Procedure Laterality Date  . Neck surgery  2012    2  total RODS / PLATES IN NECK  . Total knee arthroplasty Left   . Joint replacement    . Back surgery      5 back  . Rotator cuff repair Bilateral   . Hip surgery      LEFT - TORN TENDON  . Shoulder open rotator cuff repair Left 07/11/2014    Procedure: ROTATOR CUFF REPAIR SHOULDER OPEN WITH  GRAFT ;  Surgeon: Tobi Bastos, MD;   Location: [facility];  Service: Orthopedics;  Laterality: Left;  . Incision and drainage abscess Right 03/06/2015    Procedure: INCISION AND DRAINAGE ABSCESS RIGHT ARM;  Surgeon: Daryll Brod, MD;  Location: [place];  Service: Orthopedics;  Laterality: Right;   Family History  Problem Relation Age of Onset  . Heart failure Father   . Diabetes Father   . Hypertension Father   . Anxiety disorder Mother     also tended to overdose on anxiety medication   Social History  Substance Use Topics  . Smoking status: Former Smoker    Types: Cigarettes    Quit date: 10/09/1998  . Smokeless tobacco: None  . Alcohol Use: No    Review of Systems  All other systems reviewed and are negative.     Allergies  Morphine and related and Aspirin  Home Medications   Prior to Admission medications   Medication Sig Start Date End Date Taking? Authorizing Provider  clonazePAM (KLONOPIN) 0.5 MG tablet Take 0.5 mg by mouth 2 (two) times daily as needed for anxiety.    Historical Provider, MD  cyclobenzaprine (FLEXERIL) 10 MG tablet Take 1 tablet (10 mg total) by mouth 3 (three) times daily as needed  for muscle spasms. Patient not taking: Reported on 03/08/2015 07/11/14   Ardeen Jourdain, PA-C  gabapentin (NEURONTIN) 300 MG capsule Take 300 mg by mouth 3 (three) times daily.    Historical Provider, MD  HYDROcodone-acetaminophen (NORCO) 10-325 MG per tablet Take 1 tablet by mouth every 6 (six) hours as needed for moderate pain or severe pain.    Historical Provider, MD  lisinopril (PRINIVIL,ZESTRIL) 10 MG tablet Take 10 mg by mouth at bedtime.     Historical Provider, MD  Loratadine-Pseudoephedrine (CLARITIN-D 12 HOUR PO) Take 1 tablet by mouth every morning.    Historical Provider, MD  ondansetron (ZOFRAN) 4 MG tablet Take 1 tablet (4 mg total) by mouth every 6 (six) hours. Patient taking differently: Take 4 mg by mouth every 8 (eight) hours as needed for nausea or vomiting.  10/05/14   Serita Grit, MD  oxyCODONE-acetaminophen (PERCOCET) 10-325 MG per tablet Take 1 tablet by mouth every 4 (four) hours as needed for pain. Patient not taking: Reported on 05/02/2015 03/06/15   Daryll Brod, MD  oxyCODONE-acetaminophen (PERCOCET/ROXICET) 5-325 MG per tablet Take 1-2 tablets by mouth every 4 (four) hours as needed for moderate pain. Patient not taking: Reported on 03/08/2015 07/11/14   Ardeen Jourdain, PA-C  pantoprazole (PROTONIX) 20 MG tablet Take 20 mg by mouth daily.    Historical Provider, MD  rOPINIRole (REQUIP) 1 MG tablet Take 1 mg by mouth 2 (two) times daily.     Historical Provider, MD  simvastatin (ZOCOR) 40 MG tablet Take 40 mg by mouth at bedtime.    Historical Provider, MD  sulfamethoxazole-trimethoprim (BACTRIM) 400-80 MG per tablet Take 1 tablet by mouth 2 (two) times daily. Patient not taking: Reported on 05/02/2015 03/06/15   Daryll Brod, MD  tetrahydrozoline 0.05 % ophthalmic solution Place 1 drop into both eyes 3 (three) times daily as needed (dry eyes).    Historical Provider, MD   Pulse 74  Temp(Src) 98.2 F (36.8 C) (Oral)  Ht 6' (1.829 m)  Wt 111.131 kg  BMI 33.22 kg/m2  SpO2 92% Physical Exam  Constitutional: He is oriented to person, place, and time. He appears well-developed and well-nourished.  Non-toxic appearance. No distress.  HENT:  Head: Normocephalic and atraumatic.  Eyes: Conjunctivae, EOM and lids are normal. Pupils are equal, round, and reactive to light.  Neck: Normal range of motion. Neck supple. No tracheal deviation present. No thyroid mass present.  Cardiovascular: Normal rate, regular rhythm and normal heart sounds.  Exam reveals no gallop.   No murmur heard. Pulmonary/Chest: Effort normal and breath sounds normal. No stridor. No respiratory distress. He has no decreased breath sounds. He has no wheezes. He has no rhonchi. He has no rales.  Abdominal: Soft. Normal appearance and bowel sounds are normal. He exhibits no distension. There is no  tenderness. There is no rebound and no CVA tenderness.  Musculoskeletal: Normal range of motion. He exhibits no edema or tenderness.  Neurological: He is alert and oriented to person, place, and time. He has normal strength. No cranial nerve deficit or sensory deficit. GCS eye subscore is 4. GCS verbal subscore is 5. GCS motor subscore is 6.  Skin: Skin is warm and dry. No abrasion and no rash noted.  Psychiatric: He has a normal mood and affect. His speech is normal and behavior is normal. He expresses no suicidal ideation. He expresses no suicidal plans and no homicidal plans.  Nursing note and vitals reviewed.   ED Course  Procedures (including critical  care time) Labs Review Labs Reviewed  ACETAMINOPHEN LEVEL    Imaging Review No results found. I have personally reviewed and evaluated these images and lab results as part of my medical decision-making.   EKG Interpretation None      MDM   Final diagnoses:  None    10:49 PM Patient rechecked and is sleepy but easily aroused. We'll continue to monitor  11:25 PM Patient able to ambulate this time. No assistance needed and patient stable for discharge Lacretia Leigh, MD 12/31/15 2249  Lacretia Leigh, MD 12/31/15 2325

## 2015-12-31 NOTE — ED Notes (Signed)
Pt presents to ED via EMS for incidental overdose. Pt taken his usual Vicodin 10-325x2 morning, 2x in afternoon, then took extra 2 tonight after a procedure at right elbow today to draw off fluid. Pt reported to passed out and immediately woke up after 1mg  narcan administration by EMS. Spouse called EMS. Pt still drowsy but answer questions appropriately.

## 2015-12-31 NOTE — ED Notes (Signed)
Bed: RESB Expected date:  Expected time:  Means of arrival:  Comments: EMS 65 yo male overdosed on pain meds/narcan 1 mg

## 2017-07-15 NOTE — Progress Notes (Signed)
Please place orders in EPIC as patient  Is being scheduled for a pre-op appointment! Thank you!

## 2017-07-17 ENCOUNTER — Ambulatory Visit: Payer: Self-pay | Admitting: Orthopedic Surgery

## 2017-07-28 ENCOUNTER — Other Ambulatory Visit (HOSPITAL_COMMUNITY): Payer: Self-pay | Admitting: Emergency Medicine

## 2017-07-28 NOTE — Patient Instructions (Addendum)
Bradley Morgan  07/28/2017   Your procedure is scheduled on: 08-03-17  Report to [facility] Main  Entrance     Report to admitting at 1:00PM   Call this number if you have problems the morning of surgery  [phone]   Remember: ONLY 1 PERSON MAY GO WITH YOU TO SHORT STAY TO GET  READY MORNING OF YOUR SURGERY.    Do not eat food After Midnight. You may have clear liquids from midnight until 930am day of surgery. Nothing by mouth after 930am!!     Take these medicines the morning of surgery with A SIP OF WATER: carvedilol(coreg), clonazepam(klonopin) if needed, pantoprazole(protonix),                                 You may not have any metal on your body including hair pins and              piercings  Do not wear jewelry, make-up, lotions, powders or perfumes, deodorant                   Men may shave face and neck.   Do not bring valuables to the hospital. [place].  Contacts, dentures or bridgework may not be worn into surgery.  Leave suitcase in the car. After surgery it may be brought to your room.                Please read over the following fact sheets you were given: _____________________________________________________________________    CLEAR LIQUID DIET   Foods Allowed                                                                     Foods Excluded  Coffee and tea, regular and decaf                             liquids that you cannot  Plain Jell-O in any flavor                                             see through such as: Fruit ices (not with fruit pulp)                                     milk, soups, orange juice  Iced Popsicles                                    All solid food Carbonated beverages, regular and diet  Cranberry, grape and apple juices Sports drinks like Gatorade Lightly seasoned clear broth or consume(fat free) Sugar, honey  syrup  Sample Menu Breakfast                                Lunch                                     Supper Cranberry juice                    Beef broth                            Chicken broth Jell-O                                     Grape juice                           Apple juice Coffee or tea                        Jell-O                                      Popsicle                                                Coffee or tea                        Coffee or tea  _____________________________________________________________________  [facility] - Preparing for Surgery Before surgery, you can play an important role.  Because skin is not sterile, your skin needs to be as free of germs as possible.  You can reduce the number of germs on your skin by washing with CHG (chlorahexidine gluconate) soap before surgery.  CHG is an antiseptic cleaner which kills germs and bonds with the skin to continue killing germs even after washing. Please DO NOT use if you have an allergy to CHG or antibacterial soaps.  If your skin becomes reddened/irritated stop using the CHG and inform your nurse when you arrive at Short Stay. Do not shave (including legs and underarms) for at least 48 hours prior to the first CHG shower.  You may shave your face/neck. Please follow these instructions carefully:  1.  Shower with CHG Soap the night before surgery and the  morning of Surgery.  2.  If you choose to wash your hair, wash your hair first as usual with your  normal  shampoo.  3.  After you shampoo, rinse your hair and body thoroughly to remove the  shampoo.                           4.  Use CHG as you would any other liquid soap.  You can apply chg directly  to the skin and wash  Gently with a scrungie or clean washcloth.  5.  Apply the CHG Soap to your body ONLY FROM THE NECK DOWN.   Do not use on face/ open                           Wound or open sores. Avoid contact with eyes, ears mouth  and genitals (private parts).                       Wash face,  Genitals (private parts) with your normal soap.             6.  Wash thoroughly, paying special attention to the area where your surgery  will be performed.  7.  Thoroughly rinse your body with warm water from the neck down.  8.  DO NOT shower/wash with your normal soap after using and rinsing off  the CHG Soap.                9.  Pat yourself dry with a clean towel.            10.  Wear clean pajamas.            11.  Place clean sheets on your bed the night of your first shower and do not  sleep with pets. Day of Surgery : Do not apply any lotions/deodorants the morning of surgery.  Please wear clean clothes to the hospital/surgery center.  FAILURE TO FOLLOW THESE INSTRUCTIONS MAY RESULT IN THE CANCELLATION OF YOUR SURGERY PATIENT SIGNATURE_________________________________  NURSE SIGNATURE__________________________________  ________________________________________________________________________   Bradley Morgan  An incentive spirometer is a tool that can help keep your lungs clear and active. This tool measures how well you are filling your lungs with each breath. Taking long deep breaths may help reverse or decrease the chance of developing breathing (pulmonary) problems (especially infection) following:  A long period of time when you are unable to move or be active. BEFORE THE PROCEDURE   If the spirometer includes an indicator to show your best effort, your nurse or respiratory therapist will set it to a desired goal.  If possible, sit up straight or lean slightly forward. Try not to slouch.  Hold the incentive spirometer in an upright position. INSTRUCTIONS FOR USE  1. Sit on the edge of your bed if possible, or sit up as far as you can in bed or on a chair. 2. Hold the incentive spirometer in an upright position. 3. Breathe out normally. 4. Place the mouthpiece in your mouth and seal your lips tightly  around it. 5. Breathe in slowly and as deeply as possible, raising the piston or the ball toward the top of the column. 6. Hold your breath for 3-5 seconds or for as long as possible. Allow the piston or ball to fall to the bottom of the column. 7. Remove the mouthpiece from your mouth and breathe out normally. 8. Rest for a few seconds and repeat Steps 1 through 7 at least 10 times every 1-2 hours when you are awake. Take your time and take a few normal breaths between deep breaths. 9. The spirometer may include an indicator to show your best effort. Use the indicator as a goal to work toward during each repetition. 10. After each set of 10 deep breaths, practice coughing to be sure your lungs are clear. If you have an incision (the cut made at the time of  surgery), support your incision when coughing by placing a pillow or rolled up towels firmly against it. Once you are able to get out of bed, walk around indoors and cough well. You may stop using the incentive spirometer when instructed by your caregiver.  RISKS AND COMPLICATIONS  Take your time so you do not get dizzy or light-headed.  If you are in pain, you may need to take or ask for pain medication before doing incentive spirometry. It is harder to take a deep breath if you are having pain. AFTER USE  Rest and breathe slowly and easily.  It can be helpful to keep track of a log of your progress. Your caregiver can provide you with a simple table to help with this. If you are using the spirometer at home, follow these instructions: [place] IF:   You are having difficultly using the spirometer.  You have trouble using the spirometer as often as instructed.  Your pain medication is not giving enough relief while using the spirometer.  You develop fever of 100.5 F (38.1 C) or higher. SEEK IMMEDIATE MEDICAL CARE IF:   You cough up bloody sputum that had not been present before.  You develop fever of 102 F (38.9 C) or  greater.  You develop worsening pain at or near the incision site. MAKE SURE YOU:   Understand these instructions.  Will watch your condition.  Will get help right away if you are not doing well or get worse. Document Released: 01/17/2007 Document Revised: 11/29/2011 Document Reviewed: 03/20/2007 Exit[employer] Patient Information 2014 ExitCare, [place].   ________________________________________________________________________  WHAT IS A BLOOD TRANSFUSION? Blood Transfusion Information  A transfusion is the replacement of blood or some of its parts. Blood is made up of multiple cells which provide different functions.  Red blood cells carry oxygen and are used for blood loss replacement.  White blood cells fight against infection.  Platelets control bleeding.  Plasma helps clot blood.  Other blood products are available for specialized needs, such as hemophilia or other clotting disorders. BEFORE THE TRANSFUSION  Who gives blood for transfusions?   Healthy volunteers who are fully evaluated to make sure their blood is safe. This is blood bank blood. Transfusion therapy is the safest it has ever been in the practice of medicine. Before blood is taken from a donor, a complete history is taken to make sure that person has no history of diseases nor engages in risky social behavior (examples are intravenous drug use or sexual activity with multiple partners). The donor's travel history is screened to minimize risk of transmitting infections, such as malaria. The donated blood is tested for signs of infectious diseases, such as HIV and hepatitis. The blood is then tested to be sure it is compatible with you in order to minimize the chance of a transfusion reaction. If you or a relative donates blood, this is often done in anticipation of surgery and is not appropriate for emergency situations. It takes many days to process the donated blood. RISKS AND COMPLICATIONS Although transfusion therapy  is very safe and saves many lives, the main dangers of transfusion include:   Getting an infectious disease.  Developing a transfusion reaction. This is an allergic reaction to something in the blood you were given. Every precaution is taken to prevent this. The decision to have a blood transfusion has been considered carefully by your caregiver before blood is given. Blood is not given unless the benefits outweigh the risks. AFTER THE  TRANSFUSION  Right after receiving a blood transfusion, you will usually feel much better and more energetic. This is especially true if your red blood cells have gotten low (anemic). The transfusion raises the level of the red blood cells which carry oxygen, and this usually causes an energy increase.  The nurse administering the transfusion will monitor you carefully for complications. HOME CARE INSTRUCTIONS  No special instructions are needed after a transfusion. You may find your energy is better. Speak with your caregiver about any limitations on activity for underlying diseases you may have. SEEK MEDICAL CARE IF:   Your condition is not improving after your transfusion.  You develop redness or irritation at the intravenous (IV) site. SEEK IMMEDIATE MEDICAL CARE IF:  Any of the following symptoms occur over the next 12 hours:  Shaking chills.  You have a temperature by mouth above 102 F (38.9 C), not controlled by medicine.  Chest, back, or muscle pain.  People around you feel you are not acting correctly or are confused.  Shortness of breath or difficulty breathing.  Dizziness and fainting.  You get a rash or develop hives.  You have a decrease in urine output.  Your urine turns a dark color or changes to pink, red, or brown. Any of the following symptoms occur over the next 10 days:  You have a temperature by mouth above 102 F (38.9 C), not controlled by medicine.  Shortness of breath.  Weakness after normal activity.  The white  part of the eye turns yellow (jaundice).  You have a decrease in the amount of urine or are urinating less often.  Your urine turns a dark color or changes to pink, red, or brown. Document Released: 09/03/2000 Document Revised: 11/29/2011 Document Reviewed: 04/22/2008 [facility] Patient Information 2014 [place], [place].  _______________________________________________________________________

## 2017-07-28 NOTE — Progress Notes (Signed)
Clearance Dr Redmond Pulling on chart 05-02-17

## 2017-07-29 ENCOUNTER — Other Ambulatory Visit: Payer: Self-pay

## 2017-07-29 ENCOUNTER — Encounter (HOSPITAL_COMMUNITY): Payer: Self-pay

## 2017-07-29 ENCOUNTER — Encounter (HOSPITAL_COMMUNITY)
Admission: RE | Admit: 2017-07-29 | Discharge: 2017-07-29 | Disposition: A | Discharge status: Home / Self Care | Payer: Medicare Other | Source: Ambulatory Visit | Attending: Orthopedic Surgery | Admitting: Orthopedic Surgery

## 2017-07-29 DIAGNOSIS — Z0183 Encounter for blood typing: Secondary | ICD-10-CM | POA: Insufficient documentation

## 2017-07-29 DIAGNOSIS — Z01818 Encounter for other preprocedural examination: Secondary | ICD-10-CM | POA: Diagnosis present

## 2017-07-29 DIAGNOSIS — T84023A Instability of internal left knee prosthesis, initial encounter: Secondary | ICD-10-CM | POA: Diagnosis not present

## 2017-07-29 DIAGNOSIS — R9431 Abnormal electrocardiogram [ECG] [EKG]: Secondary | ICD-10-CM | POA: Diagnosis not present

## 2017-07-29 DIAGNOSIS — I10 Essential (primary) hypertension: Secondary | ICD-10-CM | POA: Insufficient documentation

## 2017-07-29 DIAGNOSIS — I440 Atrioventricular block, first degree: Secondary | ICD-10-CM | POA: Insufficient documentation

## 2017-07-29 DIAGNOSIS — Z01812 Encounter for preprocedural laboratory examination: Secondary | ICD-10-CM | POA: Diagnosis not present

## 2017-07-29 DIAGNOSIS — Y838 Other surgical procedures as the cause of abnormal reaction of the patient, or of later complication, without mention of misadventure at the time of the procedure: Secondary | ICD-10-CM | POA: Insufficient documentation

## 2017-07-29 LAB — COMPREHENSIVE METABOLIC PANEL
ALK PHOS: 92 U/L (ref 38–126)
ALT: 14 U/L — ABNORMAL LOW (ref 17–63)
ANION GAP: 9 (ref 5–15)
AST: 18 U/L (ref 15–41)
Albumin: 4.1 g/dL (ref 3.5–5.0)
BILIRUBIN TOTAL: 0.3 mg/dL (ref 0.3–1.2)
BUN: 22 mg/dL — AB (ref 6–20)
CALCIUM: 9.4 mg/dL (ref 8.9–10.3)
CO2: 25 mmol/L (ref 22–32)
Chloride: 108 mmol/L (ref 101–111)
Creatinine, Ser: 0.97 mg/dL (ref 0.61–1.24)
GFR calc Af Amer: >60 mL/min (ref >60)
Glucose, Bld: 96 mg/dL (ref 65–99)
POTASSIUM: 4.7 mmol/L (ref 3.5–5.1)
Sodium: 142 mmol/L (ref 135–145)
TOTAL PROTEIN: 6.6 g/dL (ref 6.5–8.1)

## 2017-07-29 LAB — CBC
HCT: 38.7 % — ABNORMAL LOW (ref 39.0–52.0)
Hemoglobin: 12.3 g/dL — ABNORMAL LOW (ref 13.0–17.0)
MCH: 30.1 pg (ref 26.0–34.0)
MCHC: 31.8 g/dL (ref 30.0–36.0)
MCV: 94.6 fL (ref 78.0–100.0)
Platelets: 192 10*3/uL (ref 150–400)
RBC: 4.09 MIL/uL — AB (ref 4.22–5.81)
RDW: 13.7 % (ref 11.5–15.5)
WBC: 8 10*3/uL (ref 4.0–10.5)

## 2017-07-29 LAB — PROTIME-INR
INR: 0.92
PROTHROMBIN TIME: 12.3 s (ref 11.4–15.2)

## 2017-07-29 LAB — ABO/RH: ABO/RH(D): A POS

## 2017-07-29 LAB — APTT: aPTT: 29 seconds (ref 24–36)

## 2017-07-29 LAB — SURGICAL PCR SCREEN
MRSA, PCR: NEGATIVE
STAPHYLOCOCCUS AUREUS: NEGATIVE

## 2017-07-29 NOTE — Progress Notes (Signed)
Patient presents to PAT appt in wheelchair with with accompanying. When patient asked to stand and step on scale for weight , RN observed patient guarding RUE. patient reports that after stretching his right arm over his head upon waking this morning, he has been experiencing pain to right shoulder. Patient reports hx of right shoulder rotator cuff repair. RN assessed patient(over clothest) to have appropriate blood return to RUE digits and appropriately palpable radial and brachial pulse. RUE did not appear discolored. Patient report [place] from shoulder radiates to right above elbow. Patient also denies stroke sx when asked (see EKG 07-29-17). Patient able to complete PAT appt, however endorsed progression of pain as appt continued, compensating by continued guarding of affected extremity. RN strongly encouraged patient to visit emergency room right after appt to have extremity assessed by a physician. Patient agrees, however wife states "we dont want anything to stop this surgery but how long is the wait time over there, I can't wait all day; I work from home. ". RN explained unaware of wait time there and again stressed importance of patient being seen by a doctor to determine severity of injury and to prevent worsening. Patient and wife both verbalized understanding.

## 2017-07-29 NOTE — Progress Notes (Signed)
   07/29/17 1101  OBSTRUCTIVE SLEEP APNEA  Have you ever been diagnosed with sleep apnea through a sleep study? No  Do you snore loudly (loud enough to be heard through closed doors)?  1  Do you often feel tired, fatigued, or sleepy during the daytime (such as falling asleep during driving or talking to someone)? 1  Has anyone observed you stop breathing during your sleep? 1  Do you have, or are you being treated for high blood pressure? 1  BMI more than 35 kg/m2? 1  Age > 50 (1-yes) 1  Neck circumference greater than:Male 16 inches or larger, Male 17inches or larger? 1  Male Gender (Yes=1) 1  Obstructive Sleep Apnea Score 8

## 2017-07-31 ENCOUNTER — Ambulatory Visit: Payer: Self-pay | Admitting: Orthopedic Surgery

## 2017-07-31 NOTE — H&P (View-Only) (Signed)
Bradley Morgan DOB: 1951-07-21 Married / Language: English / Race: White Male Date of admission:  08/03/2017 CC:  Unstable left total knee History of Present Illness The patient is a 66 year old male who comes in  for a preoperative History and Physical. The patient is scheduled for a left polyethylene liner revision versus total knee revision to be performed by Dr. Dione Plover. Aluisio, MD at [facility] on 08-03-2017. The patient is a 66 year old male who is 15 years out from left total knee arthroplasty. The patient states that he is not doing well at this time. The pain is under poor control at this time and describe their pain as moderate to severe. They are currently on Hydrocodone (Rx from PCP) for their pain. The patient feels that they are progressing poorly at this time. Bradley Morgan had has original knee was put in back in 2003. He has been having some progressive pain and discomfort over the past year or more. He has always had a little bit of discomfort in the knee but is changed over the past year he describes burning sensation on the front of his thigh and knee. He describes some night pain and actually states that when he is lying flat the pain is worse in the evening but he does have pain at rest and some increased pain with weightbearing and walking. There is some numbness and tingling in the leg but he said that for a while and may be associated with his 5 back surgeries. He states the leg does feel weak and has been buckling on him. He has noticed some swelling, popping, and buckling with the knee. He does describe some pain in and around the left hip but the knee is the more problematic of the two. He states when he is walking, the knee feels like it shifting and buckling and it is worse with incline decline, going up and down stairs. He is felt to have an unstable knee on exam and would require revision of the left knee. Risks and benefits of the surgery have been discussed  at length with the patient by Dr. Wynelle Link. It is felt that he would at least require revision of the polyethylene liner for stability but it has been discussed that he may require full revision of the previous total knee.  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) [10/09/2001]: Chronic Pain  Anxiety Disorder  Depression  R LF PIP dislocation  Hypertension  Gastric Ulcer  Gastroesophageal Reflux Disease     Allergies  nonsteroidals [12/30/2000]: Ulcers ASA [12/30/2000]: Bleeding Ulcers MORPHINE [11/08/2005]: Confusion, Difficulty Breathing  Family History  Heart Disease  father Diabetes Mellitus  father Cancer  mother Hypertension  mother and father Depression  mother Congestive Heart Failure  father Kidney disease  mother Osteoarthritis  mother  Social History Drug/Alcohol Rehab (Currently)  no Drug/Alcohol Rehab (Previously)  no Pain Contract  no Exercise  Exercises daily; does running / walking Marital status  married Number of flights of stairs before winded  less than 1 Illicit drug use  no Tobacco use  former smoker Children  4 Current work status  disabled [place] With Family, Home, Straight to Outpatient Therapy. Outpatient Therapy is just a line exchange performed on 08/03/2017  Medication History  Gabapentin (300MG  Capsule, Oral) Active. Hydrocodone-Acetaminophen (10-325MG  Tablet, Oral) Active.  Zocor (Oral) Specific strength unknown - Active. ROPINIRole HCl (Oral) Specific strength unknown - Active. ClonazePAM (Oral) Specific strength unknown - Active. Protonix (Oral) Specific strength unknown - Active. BP med Active.  Past Surgical History  Total Hip Replacement  left Neck Disc Surgery  Hip Fracture and Surgery  left Spinal Fusion  neck and lower  back Rotator Cuff Repair  bilateral Arthroscopy of Knee  left Spinal Surgery    Review of Systems  General Not Present- Chills, Fatigue, Fever, Memory Loss, Night Sweats, Weight Gain and Weight Loss. Skin Not Present- Eczema, Hives, Itching, Lesions and Rash. HEENT Not Present- Dentures, Double Vision, Headache, Hearing Loss, Tinnitus and Visual Loss. Respiratory Not Present- Allergies, Chronic Cough, Coughing up blood, Shortness of breath at rest and Shortness of breath with exertion. Cardiovascular Not Present- Chest Pain, Difficulty Breathing Lying Down, Murmur, Palpitations, Racing/skipping heartbeats and Swelling. Gastrointestinal Not Present- Abdominal Pain, Bloody Stool, Constipation, Diarrhea, Difficulty Swallowing, Heartburn, Jaundice, Loss of appetitie, Nausea and Vomiting. Male Genitourinary Not Present- Blood in Urine, Discharge, Flank Pain, Incontinence, Painful Urination, Urgency, Urinary frequency, Urinary Retention, Urinating at Night and Weak urinary stream. Musculoskeletal Not Present- Back Pain, Joint Pain, Joint Swelling, Morning Stiffness, Muscle Pain, Muscle Weakness and Spasms. Neurological Not Present- Blackout spells, Difficulty with balance, Dizziness, Paralysis, Tremor and Weakness. Psychiatric Not Present- Insomnia.  Vitals  Weight: 240 lb Height: 73in Weight was reported by patient. Height was reported by patient. Body Surface Area: 2.33 m Body Mass Index: 31.66 kg/m  Pulse: 84 (Regular)  BP: 152/86 (Sitting, Left Arm, Standard)   Physical Exam  General Mental Status -Alert, cooperative and good historian. General Appearance-pleasant, Not in acute distress. Orientation-Oriented X3. Build & Nutrition-Well nourished and Well developed.  Head and Neck Head-normocephalic, atraumatic . Neck Global Assessment - supple, no bruit auscultated on the right, no bruit auscultated on the left.  Eye Pupil - Bilateral-Regular and  Round. Motion - Bilateral-EOMI.  Chest and Lung Exam Auscultation Breath sounds - clear at anterior chest wall. Adventitious sounds - Coarse rales - Right Lower Lobe (Posterior) and Right Upper Lobe (Posterior).  Cardiovascular Auscultation Rhythm - Regular rate and rhythm. Heart Sounds - S1 WNL and S2 WNL. Murmurs & Other Heart Sounds - Auscultation of the heart reveals - No Murmurs.  Abdomen Palpation/Percussion Tenderness - Abdomen is non-tender to palpation. Rigidity (guarding) - Abdomen is soft. Auscultation Auscultation of the abdomen reveals - Bowel sounds normal.  Male Genitourinary Note: Not done, not pertinent to present illness   Musculoskeletal Note: Left knee shows a previous anterior incision, he has near full extension with flexion back to 115. Slight effusionsome varus and valgus instability is noted at full extension with moderate laxity noted at 45 of flexion with varus and valgus stressing. He has a positive anterior drawer maneuver with shifting of tibia anteriorly. Neurovascular intact throughout the lower extremity.  X-rays AP and lateral view of the left knee shows a left total knee replacement arthroplasty. 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 Kathryne Eriksson - Patient has been seen preoperatively and felt to be stable for  surgery.  IV TXA  Anesthesia Issues: Difficult to wake up in the past  Patient was instructed on what medications to stop prior to surgery.  Signed electronically by Joelene Millin, III  PA-C

## 2017-07-31 NOTE — H&P (Signed)
Bradley Morgan DOB: [birth year] Married / Language: English / Race: White Male Date of admission:  08/03/2017 CC:  Unstable left total knee History of Present Illness The patient is a 66 year old male who comes in  for a preoperative History and Physical. The patient is scheduled for a left polyethylene liner revision versus total knee revision to be performed by Dr. Dione Plover. Aluisio, MD at [facility] on 08-03-2017. The patient is a 66 year old male who is 15 years out from left total knee arthroplasty. The patient states that he is not doing well at this time. The pain is under poor control at this time and describe their pain as moderate to severe. They are currently on Hydrocodone (Rx from PCP) for their pain. The patient feels that they are progressing poorly at this time. Bradley Morgan had has original knee was put in back in 2003. He has been having some progressive pain and discomfort over the past year or more. He has always had a little bit of discomfort in the knee but is changed over the past year he describes burning sensation on the front of his thigh and knee. He describes some night pain and actually states that when he is lying flat the pain is worse in the evening but he does have pain at rest and some increased pain with weightbearing and walking. There is some numbness and tingling in the leg but he said that for a while and may be associated with his 5 back surgeries. He states the leg does feel weak and has been buckling on him. He has noticed some swelling, popping, and buckling with the knee. He does describe some pain in and around the left hip but the knee is the more problematic of the two. He states when he is walking, the knee feels like it shifting and buckling and it is worse with incline decline, going up and down stairs. He is felt to have an unstable knee on exam and would require revision of the left knee. Risks and benefits of the surgery have been discussed  at length with the patient by Dr. Wynelle Link. It is felt that he would at least require revision of the polyethylene liner for stability but it has been discussed that he may require full revision of the previous total knee.  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) [10/09/2001]: Chronic Pain  Anxiety Disorder  Depression  R LF PIP dislocation  Hypertension  Gastric Ulcer  Gastroesophageal Reflux Disease     Allergies  nonsteroidals [12/30/2000]: Ulcers ASA [12/30/2000]: Bleeding Ulcers MORPHINE [11/08/2005]: Confusion, Difficulty Breathing  Family History  Heart Disease  father Diabetes Mellitus  father Cancer  mother Hypertension  mother and father Depression  mother Congestive Heart Failure  father Kidney disease  mother Osteoarthritis  mother  Social History Drug/Alcohol Rehab (Currently)  no Drug/Alcohol Rehab (Previously)  no Pain Contract  no Exercise  Exercises daily; does running / walking Marital status  married Number of flights of stairs before winded  less than 1 Illicit drug use  no Tobacco use  former smoker Children  4 Current work status  disabled [place] With Family, Home, Straight to Outpatient Therapy. Outpatient Therapy is just a line exchange performed on 08/03/2017  Medication History  Gabapentin (300MG  Capsule, Oral) Active. Hydrocodone-Acetaminophen (10-325MG  Tablet, Oral) Active.  Zocor (Oral) Specific strength unknown - Active. ROPINIRole HCl (Oral) Specific strength unknown - Active. ClonazePAM (Oral) Specific strength unknown - Active. Protonix (Oral) Specific strength unknown - Active. BP med Active.  Past Surgical History  Total Hip Replacement  left Neck Disc Surgery  Hip Fracture and Surgery  left Spinal Fusion  neck and lower  back Rotator Cuff Repair  bilateral Arthroscopy of Knee  left Spinal Surgery    Review of Systems  General Not Present- Chills, Fatigue, Fever, Memory Loss, Night Sweats, Weight Gain and Weight Loss. Skin Not Present- Eczema, Hives, Itching, Lesions and Rash. HEENT Not Present- Dentures, Double Vision, Headache, Hearing Loss, Tinnitus and Visual Loss. Respiratory Not Present- Allergies, Chronic Cough, Coughing up blood, Shortness of breath at rest and Shortness of breath with exertion. Cardiovascular Not Present- Chest Pain, Difficulty Breathing Lying Down, Murmur, Palpitations, Racing/skipping heartbeats and Swelling. Gastrointestinal Not Present- Abdominal Pain, Bloody Stool, Constipation, Diarrhea, Difficulty Swallowing, Heartburn, Jaundice, Loss of appetitie, Nausea and Vomiting. Male Genitourinary Not Present- Blood in Urine, Discharge, Flank Pain, Incontinence, Painful Urination, Urgency, Urinary frequency, Urinary Retention, Urinating at Night and Weak urinary stream. Musculoskeletal Not Present- Back Pain, Joint Pain, Joint Swelling, Morning Stiffness, Muscle Pain, Muscle Weakness and Spasms. Neurological Not Present- Blackout spells, Difficulty with balance, Dizziness, Paralysis, Tremor and Weakness. Psychiatric Not Present- Insomnia.  Vitals  Weight: 240 lb Height: 73in Weight was reported by patient. Height was reported by patient. Body Surface Area: 2.33 m Body Mass Index: 31.66 kg/m  Pulse: 84 (Regular)  BP: 152/86 (Sitting, Left Arm, Standard)   Physical Exam  General Mental Status -Alert, cooperative and good historian. General Appearance-pleasant, Not in acute distress. Orientation-Oriented X3. Build & Nutrition-Well nourished and Well developed.  Head and Neck Head-normocephalic, atraumatic . Neck Global Assessment - supple, no bruit auscultated on the right, no bruit auscultated on the left.  Eye Pupil - Bilateral-Regular and  Round. Motion - Bilateral-EOMI.  Chest and Lung Exam Auscultation Breath sounds - clear at anterior chest wall. Adventitious sounds - Coarse rales - Right Lower Lobe (Posterior) and Right Upper Lobe (Posterior).  Cardiovascular Auscultation Rhythm - Regular rate and rhythm. Heart Sounds - S1 WNL and S2 WNL. Murmurs & Other Heart Sounds - Auscultation of the heart reveals - No Murmurs.  Abdomen Palpation/Percussion Tenderness - Abdomen is non-tender to palpation. Rigidity (guarding) - Abdomen is soft. Auscultation Auscultation of the abdomen reveals - Bowel sounds normal.  Male Genitourinary Note: Not done, not pertinent to present illness   Musculoskeletal Note: Left knee shows a previous anterior incision, he has near full extension with flexion back to 115. Slight effusionsome varus and valgus instability is noted at full extension with moderate laxity noted at 45 of flexion with varus and valgus stressing. He has a positive anterior drawer maneuver with shifting of tibia anteriorly. Neurovascular intact throughout the lower extremity.  X-rays AP and lateral view of the left knee shows a left total knee replacement arthroplasty. Components appear to be in good position and alignment x-rays also show a healed fibular fracture there appears to be some changes of lucency noted under the tibial tray especially on the medial side   Assessment & Plan Status post total left knee replacement (W73.710) Note:Surgical Plans: Left Knee Polyethylene Liner Revision versus Left Total Knee Hip Revision  Disposition: Home with family, Straight to outpatient on Friday Nov 16th if only liner exchange  PCP: Dr. Kathryne Eriksson - Patient has been seen preoperatively and felt to be stable for  surgery.  IV TXA  Anesthesia Issues: Difficult to wake up in the past  Patient was instructed on what medications to stop prior to surgery.  Signed electronically by Joelene Millin, III  PA-C

## 2017-08-03 ENCOUNTER — Other Ambulatory Visit: Payer: Self-pay

## 2017-08-03 ENCOUNTER — Inpatient Hospital Stay (HOSPITAL_COMMUNITY): Payer: Medicare Other | Admitting: Anesthesiology

## 2017-08-03 ENCOUNTER — Encounter (HOSPITAL_COMMUNITY): Payer: Self-pay

## 2017-08-03 ENCOUNTER — Inpatient Hospital Stay (HOSPITAL_COMMUNITY)
Admission: RE | Admit: 2017-08-03 | Discharge: 2017-08-04 | DRG: 489 | Disposition: A | Discharge status: Home / Self Care | Payer: Medicare Other | Source: Ambulatory Visit | Attending: Orthopedic Surgery | Admitting: Orthopedic Surgery

## 2017-08-03 ENCOUNTER — Encounter (HOSPITAL_COMMUNITY)
Admission: RE | Disposition: A | Discharge status: Home / Self Care | Payer: Self-pay | Source: Ambulatory Visit | Attending: Orthopedic Surgery

## 2017-08-03 DIAGNOSIS — T84023A Instability of internal left knee prosthesis, initial encounter: Principal | ICD-10-CM | POA: Diagnosis present

## 2017-08-03 DIAGNOSIS — K219 Gastro-esophageal reflux disease without esophagitis: Secondary | ICD-10-CM | POA: Diagnosis present

## 2017-08-03 DIAGNOSIS — E785 Hyperlipidemia, unspecified: Secondary | ICD-10-CM | POA: Diagnosis present

## 2017-08-03 DIAGNOSIS — G8929 Other chronic pain: Secondary | ICD-10-CM | POA: Diagnosis present

## 2017-08-03 DIAGNOSIS — Z87891 Personal history of nicotine dependence: Secondary | ICD-10-CM | POA: Diagnosis not present

## 2017-08-03 DIAGNOSIS — F419 Anxiety disorder, unspecified: Secondary | ICD-10-CM | POA: Diagnosis present

## 2017-08-03 DIAGNOSIS — Z886 Allergy status to analgesic agent status: Secondary | ICD-10-CM

## 2017-08-03 DIAGNOSIS — Y838 Other surgical procedures as the cause of abnormal reaction of the patient, or of later complication, without mention of misadventure at the time of the procedure: Secondary | ICD-10-CM | POA: Diagnosis present

## 2017-08-03 DIAGNOSIS — Z885 Allergy status to narcotic agent status: Secondary | ICD-10-CM | POA: Diagnosis not present

## 2017-08-03 DIAGNOSIS — Z23 Encounter for immunization: Secondary | ICD-10-CM | POA: Diagnosis present

## 2017-08-03 DIAGNOSIS — M25562 Pain in left knee: Secondary | ICD-10-CM | POA: Diagnosis present

## 2017-08-03 DIAGNOSIS — Z8249 Family history of ischemic heart disease and other diseases of the circulatory system: Secondary | ICD-10-CM | POA: Diagnosis not present

## 2017-08-03 DIAGNOSIS — G2581 Restless legs syndrome: Secondary | ICD-10-CM | POA: Diagnosis present

## 2017-08-03 DIAGNOSIS — Y792 Prosthetic and other implants, materials and accessory orthopedic devices associated with adverse incidents: Secondary | ICD-10-CM | POA: Diagnosis present

## 2017-08-03 DIAGNOSIS — Z96659 Presence of unspecified artificial knee joint: Secondary | ICD-10-CM

## 2017-08-03 DIAGNOSIS — I10 Essential (primary) hypertension: Secondary | ICD-10-CM | POA: Diagnosis present

## 2017-08-03 DIAGNOSIS — Z79899 Other long term (current) drug therapy: Secondary | ICD-10-CM | POA: Diagnosis not present

## 2017-08-03 DIAGNOSIS — T84018A Broken internal joint prosthesis, other site, initial encounter: Secondary | ICD-10-CM

## 2017-08-03 DIAGNOSIS — F329 Major depressive disorder, single episode, unspecified: Secondary | ICD-10-CM | POA: Diagnosis present

## 2017-08-03 HISTORY — PX: TOTAL KNEE REVISION: SHX996

## 2017-08-03 LAB — TYPE AND SCREEN
ABO/RH(D): A POS
ANTIBODY SCREEN: NEGATIVE

## 2017-08-03 SURGERY — TOTAL KNEE REVISION
Anesthesia: Spinal | Site: Knee | Laterality: Left

## 2017-08-03 MED ORDER — CEFAZOLIN SODIUM-DEXTROSE 2-4 GM/100ML-% IV SOLN
2.0000 g | INTRAVENOUS | Status: AC
  Administered 2017-08-03: 2 g via INTRAVENOUS

## 2017-08-03 MED ORDER — HYDRALAZINE HCL 20 MG/ML IJ SOLN
INTRAMUSCULAR | Status: AC
  Filled 2017-08-03: qty 1

## 2017-08-03 MED ORDER — HYDRALAZINE HCL 20 MG/ML IJ SOLN
10.0000 mg | Freq: Once | INTRAMUSCULAR | Status: AC
  Administered 2017-08-03: 10 mg via INTRAVENOUS

## 2017-08-03 MED ORDER — RIVAROXABAN 10 MG PO TABS
10.0000 mg | ORAL_TABLET | Freq: Every day | ORAL | Status: DC
  Administered 2017-08-04: 10 mg via ORAL
  Filled 2017-08-03: qty 1

## 2017-08-03 MED ORDER — METOCLOPRAMIDE HCL 5 MG PO TABS: 5.0000 mg | ORAL_TABLET | Freq: Three times a day (TID) | ORAL | Status: DC | PRN

## 2017-08-03 MED ORDER — ACETAMINOPHEN 10 MG/ML IV SOLN
1000.0000 mg | Freq: Once | INTRAVENOUS | Status: AC
  Administered 2017-08-03: 1000 mg via INTRAVENOUS

## 2017-08-03 MED ORDER — SODIUM CHLORIDE 0.9 % IJ SOLN
INTRAMUSCULAR | Status: AC
  Filled 2017-08-03: qty 10

## 2017-08-03 MED ORDER — SODIUM CHLORIDE 0.9 % IR SOLN
Status: DC | PRN
  Administered 2017-08-03: 1000 mL

## 2017-08-03 MED ORDER — ACETAMINOPHEN 500 MG PO TABS
1000.0000 mg | ORAL_TABLET | Freq: Four times a day (QID) | ORAL | Status: DC
Start: 2017-08-03 — End: 2017-08-04
  Administered 2017-08-04 (×3): 1000 mg via ORAL
  Filled 2017-08-03 (×3): qty 2

## 2017-08-03 MED ORDER — METHOCARBAMOL 1000 MG/10ML IJ SOLN
500.0000 mg | Freq: Four times a day (QID) | INTRAVENOUS | Status: DC | PRN
  Filled 2017-08-03: qty 5

## 2017-08-03 MED ORDER — FENTANYL CITRATE (PF) 100 MCG/2ML IJ SOLN
INTRAMUSCULAR | Status: AC
  Filled 2017-08-03: qty 2

## 2017-08-03 MED ORDER — ROPINIROLE HCL 1 MG PO TABS
1.0000 mg | ORAL_TABLET | Freq: Every evening | ORAL | Status: DC | PRN
  Administered 2017-08-04: 1 mg via ORAL
  Filled 2017-08-03: qty 1

## 2017-08-03 MED ORDER — OXYCODONE HCL 5 MG PO TABS
5.0000 mg | ORAL_TABLET | ORAL | Status: DC | PRN
  Administered 2017-08-03: 5 mg via ORAL
  Filled 2017-08-03: qty 1

## 2017-08-03 MED ORDER — SODIUM CHLORIDE 0.9 % IV SOLN
INTRAVENOUS | Status: DC
  Administered 2017-08-03: 23:00:00 via INTRAVENOUS

## 2017-08-03 MED ORDER — DEXAMETHASONE SODIUM PHOSPHATE 10 MG/ML IJ SOLN
10.0000 mg | Freq: Once | INTRAMUSCULAR | Status: AC
  Administered 2017-08-03: 10 mg via INTRAVENOUS

## 2017-08-03 MED ORDER — TRANEXAMIC ACID 1000 MG/10ML IV SOLN
1000.0000 mg | INTRAVENOUS | Status: AC
  Administered 2017-08-03: 1000 mg via INTRAVENOUS
  Filled 2017-08-03: qty 1100

## 2017-08-03 MED ORDER — BUPIVACAINE LIPOSOME 1.3 % IJ SUSP
INTRAMUSCULAR | Status: DC | PRN
  Administered 2017-08-03: 20 mL

## 2017-08-03 MED ORDER — LABETALOL HCL 5 MG/ML IV SOLN
INTRAVENOUS | Status: DC | PRN
  Administered 2017-08-03: 5 mg via INTRAVENOUS

## 2017-08-03 MED ORDER — MENTHOL 3 MG MT LOZG: 1.0000 | LOZENGE | OROMUCOSAL | Status: DC | PRN

## 2017-08-03 MED ORDER — PROPOFOL 500 MG/50ML IV EMUL
INTRAVENOUS | Status: DC | PRN
  Administered 2017-08-03: 60 mg via INTRAVENOUS
  Administered 2017-08-03: 120 mg via INTRAVENOUS

## 2017-08-03 MED ORDER — TRAMADOL HCL 50 MG PO TABS: 50.0000 mg | ORAL_TABLET | Freq: Four times a day (QID) | ORAL | Status: DC | PRN

## 2017-08-03 MED ORDER — CYCLOBENZAPRINE HCL 10 MG PO TABS: 10.0000 mg | ORAL_TABLET | Freq: Three times a day (TID) | ORAL | Status: DC | PRN

## 2017-08-03 MED ORDER — ACETAMINOPHEN 10 MG/ML IV SOLN
INTRAVENOUS | Status: AC
  Filled 2017-08-03: qty 100

## 2017-08-03 MED ORDER — GABAPENTIN 300 MG PO CAPS
600.0000 mg | ORAL_CAPSULE | Freq: Every day | ORAL | Status: DC
  Administered 2017-08-04: 600 mg via ORAL
  Filled 2017-08-03: qty 2

## 2017-08-03 MED ORDER — OXYCODONE HCL 5 MG PO TABS
10.0000 mg | ORAL_TABLET | ORAL | Status: DC | PRN
  Administered 2017-08-04 (×4): 10 mg via ORAL
  Filled 2017-08-03 (×4): qty 2

## 2017-08-03 MED ORDER — CHLORHEXIDINE GLUCONATE 4 % EX LIQD: 60.0000 mL | Freq: Once | CUTANEOUS | Status: DC

## 2017-08-03 MED ORDER — ONDANSETRON HCL 4 MG PO TABS: 4.0000 mg | ORAL_TABLET | Freq: Four times a day (QID) | ORAL | Status: DC | PRN

## 2017-08-03 MED ORDER — PANTOPRAZOLE SODIUM 40 MG PO TBEC
40.0000 mg | DELAYED_RELEASE_TABLET | Freq: Every day | ORAL | Status: DC
  Administered 2017-08-04: 40 mg via ORAL
  Filled 2017-08-03: qty 1

## 2017-08-03 MED ORDER — FENTANYL CITRATE (PF) 100 MCG/2ML IJ SOLN
INTRAMUSCULAR | Status: AC
  Administered 2017-08-03: 50 ug via INTRAVENOUS
  Filled 2017-08-03: qty 2

## 2017-08-03 MED ORDER — ROPIVACAINE HCL 7.5 MG/ML IJ SOLN
INTRAMUSCULAR | Status: DC | PRN
  Administered 2017-08-03: 20 mL via PERINEURAL

## 2017-08-03 MED ORDER — METOCLOPRAMIDE HCL 5 MG/ML IJ SOLN: 5.0000 mg | Freq: Three times a day (TID) | INTRAMUSCULAR | Status: DC | PRN

## 2017-08-03 MED ORDER — POLYETHYLENE GLYCOL 3350 17 G PO PACK: 17.0000 g | PACK | Freq: Every day | ORAL | Status: DC | PRN

## 2017-08-03 MED ORDER — PROMETHAZINE HCL 25 MG/ML IJ SOLN: 6.2500 mg | INTRAMUSCULAR | Status: DC | PRN

## 2017-08-03 MED ORDER — BUPIVACAINE HCL (PF) 0.25 % IJ SOLN
INTRAMUSCULAR | Status: AC
  Filled 2017-08-03: qty 30

## 2017-08-03 MED ORDER — GABAPENTIN 300 MG PO CAPS
300.0000 mg | ORAL_CAPSULE | Freq: Three times a day (TID) | ORAL | Status: DC
  Administered 2017-08-04 (×2): 300 mg via ORAL
  Filled 2017-08-03 (×2): qty 1

## 2017-08-03 MED ORDER — PHENYLEPHRINE 40 MCG/ML (10ML) SYRINGE FOR IV PUSH (FOR BLOOD PRESSURE SUPPORT)
PREFILLED_SYRINGE | INTRAVENOUS | Status: DC | PRN
  Administered 2017-08-03: 80 ug via INTRAVENOUS

## 2017-08-03 MED ORDER — BISACODYL 10 MG RE SUPP: 10.0000 mg | Freq: Every day | RECTAL | Status: DC | PRN

## 2017-08-03 MED ORDER — FENTANYL CITRATE (PF) 100 MCG/2ML IJ SOLN
100.0000 ug | Freq: Once | INTRAMUSCULAR | Status: AC
  Administered 2017-08-03: 50 ug via INTRAVENOUS

## 2017-08-03 MED ORDER — LACTATED RINGERS IV SOLN
INTRAVENOUS | Status: DC
  Administered 2017-08-03: 1000 mL via INTRAVENOUS
  Administered 2017-08-03: 16:00:00 via INTRAVENOUS

## 2017-08-03 MED ORDER — DIPHENHYDRAMINE HCL 12.5 MG/5ML PO ELIX: 12.5000 mg | ORAL_SOLUTION | ORAL | Status: DC | PRN

## 2017-08-03 MED ORDER — SIMVASTATIN 20 MG PO TABS
40.0000 mg | ORAL_TABLET | Freq: Every day | ORAL | Status: DC
  Administered 2017-08-04: 40 mg via ORAL
  Filled 2017-08-03: qty 2

## 2017-08-03 MED ORDER — SODIUM CHLORIDE 0.9 % IJ SOLN
INTRAMUSCULAR | Status: DC | PRN
  Administered 2017-08-03: 60 mL

## 2017-08-03 MED ORDER — SUCCINYLCHOLINE CHLORIDE 20 MG/ML IJ SOLN
INTRAMUSCULAR | Status: DC | PRN
  Administered 2017-08-03: 120 mg via INTRAVENOUS

## 2017-08-03 MED ORDER — HYDROMORPHONE HCL 1 MG/ML IJ SOLN: 0.2500 mg | INTRAMUSCULAR | Status: DC | PRN

## 2017-08-03 MED ORDER — ACETAMINOPHEN 325 MG PO TABS: 650.0000 mg | ORAL_TABLET | ORAL | Status: DC | PRN

## 2017-08-03 MED ORDER — FENTANYL CITRATE (PF) 100 MCG/2ML IJ SOLN
INTRAMUSCULAR | Status: DC | PRN
  Administered 2017-08-03 (×2): 50 ug via INTRAVENOUS
  Administered 2017-08-03: 100 ug via INTRAVENOUS

## 2017-08-03 MED ORDER — CEFAZOLIN SODIUM-DEXTROSE 2-4 GM/100ML-% IV SOLN
INTRAVENOUS | Status: AC
  Filled 2017-08-03: qty 100

## 2017-08-03 MED ORDER — PHENOL 1.4 % MT LIQD: 1.0000 | OROMUCOSAL | Status: DC | PRN

## 2017-08-03 MED ORDER — ONDANSETRON HCL 4 MG/2ML IJ SOLN: 4.0000 mg | Freq: Four times a day (QID) | INTRAMUSCULAR | Status: DC | PRN

## 2017-08-03 MED ORDER — MIDAZOLAM HCL 2 MG/2ML IJ SOLN: 2.0000 mg | Freq: Once | INTRAMUSCULAR | Status: DC

## 2017-08-03 MED ORDER — LIDOCAINE 2% (20 MG/ML) 5 ML SYRINGE
INTRAMUSCULAR | Status: DC | PRN
  Administered 2017-08-03: 60 mg via INTRAVENOUS

## 2017-08-03 MED ORDER — CLONAZEPAM 0.5 MG PO TABS: 0.5000 mg | ORAL_TABLET | Freq: Three times a day (TID) | ORAL | Status: DC | PRN

## 2017-08-03 MED ORDER — BUPIVACAINE LIPOSOME 1.3 % IJ SUSP
20.0000 mL | Freq: Once | INTRAMUSCULAR | Status: DC
  Filled 2017-08-03: qty 20

## 2017-08-03 MED ORDER — MIDAZOLAM HCL 2 MG/2ML IJ SOLN
INTRAMUSCULAR | Status: AC
  Filled 2017-08-03: qty 2

## 2017-08-03 MED ORDER — DEXAMETHASONE SODIUM PHOSPHATE 10 MG/ML IJ SOLN
10.0000 mg | Freq: Once | INTRAMUSCULAR | Status: AC
  Administered 2017-08-04: 10 mg via INTRAVENOUS
  Filled 2017-08-03: qty 1

## 2017-08-03 MED ORDER — METHOCARBAMOL 500 MG PO TABS
500.0000 mg | ORAL_TABLET | Freq: Four times a day (QID) | ORAL | Status: DC | PRN
  Administered 2017-08-03 – 2017-08-04 (×2): 500 mg via ORAL
  Filled 2017-08-03 (×2): qty 1

## 2017-08-03 MED ORDER — PROPOFOL 10 MG/ML IV BOLUS
INTRAVENOUS | Status: AC
  Filled 2017-08-03: qty 60

## 2017-08-03 MED ORDER — CARVEDILOL 3.125 MG PO TABS
3.1250 mg | ORAL_TABLET | Freq: Two times a day (BID) | ORAL | Status: DC
  Administered 2017-08-04: 3.125 mg via ORAL
  Filled 2017-08-03: qty 1

## 2017-08-03 MED ORDER — PHENYLEPHRINE 40 MCG/ML (10ML) SYRINGE FOR IV PUSH (FOR BLOOD PRESSURE SUPPORT)
PREFILLED_SYRINGE | INTRAVENOUS | Status: AC
  Filled 2017-08-03: qty 30

## 2017-08-03 MED ORDER — ONDANSETRON HCL 4 MG/2ML IJ SOLN
INTRAMUSCULAR | Status: DC | PRN
  Administered 2017-08-03: 4 mg via INTRAVENOUS

## 2017-08-03 MED ORDER — DOCUSATE SODIUM 100 MG PO CAPS
100.0000 mg | ORAL_CAPSULE | Freq: Two times a day (BID) | ORAL | Status: DC
  Administered 2017-08-04 (×2): 100 mg via ORAL
  Filled 2017-08-03 (×2): qty 1

## 2017-08-03 MED ORDER — ACETAMINOPHEN 650 MG RE SUPP: 650.0000 mg | RECTAL | Status: DC | PRN

## 2017-08-03 MED ORDER — 0.9 % SODIUM CHLORIDE (POUR BTL) OPTIME
TOPICAL | Status: DC | PRN
  Administered 2017-08-03: 1000 mL

## 2017-08-03 MED ORDER — GABAPENTIN 300 MG PO CAPS
300.0000 mg | ORAL_CAPSULE | Freq: Once | ORAL | Status: AC
  Administered 2017-08-03: 300 mg via ORAL

## 2017-08-03 MED ORDER — INFLUENZA VAC SPLIT QUAD 0.5 ML IM SUSY
0.5000 mL | PREFILLED_SYRINGE | INTRAMUSCULAR | Status: AC
  Administered 2017-08-04: 0.5 mL via INTRAMUSCULAR
  Filled 2017-08-03: qty 0.5

## 2017-08-03 MED ORDER — SODIUM CHLORIDE 0.9 % IJ SOLN
INTRAMUSCULAR | Status: AC
  Filled 2017-08-03: qty 50

## 2017-08-03 MED ORDER — CEFAZOLIN SODIUM-DEXTROSE 2-4 GM/100ML-% IV SOLN
2.0000 g | Freq: Four times a day (QID) | INTRAVENOUS | Status: AC
  Administered 2017-08-03 – 2017-08-04 (×2): 2 g via INTRAVENOUS
  Filled 2017-08-03 (×2): qty 100

## 2017-08-03 MED ORDER — GABAPENTIN 300 MG PO CAPS
ORAL_CAPSULE | ORAL | Status: AC
  Filled 2017-08-03: qty 1

## 2017-08-03 MED ORDER — FLEET ENEMA 7-19 GM/118ML RE ENEM: 1.0000 | ENEMA | Freq: Once | RECTAL | Status: DC | PRN

## 2017-08-03 SURGICAL SUPPLY — 54 items
BAG DECANTER FOR FLEXI CONT (MISCELLANEOUS) ×1 IMPLANT
BAG SPEC THK2 15X12 ZIP CLS (MISCELLANEOUS) ×1
BAG ZIPLOCK 12X15 (MISCELLANEOUS) ×1 IMPLANT
BANDAGE ACE 6X5 VEL STRL LF (GAUZE/BANDAGES/DRESSINGS) ×2 IMPLANT
BLADE SAG 18X100X1.27 (BLADE) ×2 IMPLANT
BLADE SAW SGTL 11.0X1.19X90.0M (BLADE) ×2 IMPLANT
BONE CEMENT GENTAMICIN (Cement) ×2 IMPLANT
BOWL SMART MIX CTS (DISPOSABLE) ×1 IMPLANT
CEMENT BONE GENTAMICIN 40 (Cement) ×3 IMPLANT
CLOTH BEACON ORANGE TIMEOUT ST (SAFETY) ×2 IMPLANT
COVER SURGICAL LIGHT HANDLE (MISCELLANEOUS) ×2 IMPLANT
CUFF TOURN SGL QUICK 34 (TOURNIQUET CUFF) ×2
CUFF TRNQT CYL 34X4X40X1 (TOURNIQUET CUFF) ×1 IMPLANT
DRAPE U-SHAPE 47X51 STRL (DRAPES) ×2 IMPLANT
DRSG ADAPTIC 3X8 NADH LF (GAUZE/BANDAGES/DRESSINGS) ×2 IMPLANT
DRSG PAD ABDOMINAL 8X10 ST (GAUZE/BANDAGES/DRESSINGS) ×2 IMPLANT
DURAPREP 26ML APPLICATOR (WOUND CARE) ×2 IMPLANT
ELECT CAUTERY BLADE 6.4 (BLADE) ×1 IMPLANT
ELECT REM PT RETURN 15FT ADLT (MISCELLANEOUS) ×2 IMPLANT
EVACUATOR 1/8 PVC DRAIN (DRAIN) ×2 IMPLANT
GAUZE SPONGE 4X4 12PLY STRL (GAUZE/BANDAGES/DRESSINGS) ×2 IMPLANT
GLOVE BIO SURGEON STRL SZ7.5 (GLOVE) ×1 IMPLANT
GLOVE BIO SURGEON STRL SZ8 (GLOVE) ×2 IMPLANT
GLOVE BIOGEL PI IND STRL 8 (GLOVE) ×1 IMPLANT
GLOVE BIOGEL PI INDICATOR 8 (GLOVE) ×2
GLOVE SURG SS PI 6.5 STRL IVOR (GLOVE) IMPLANT
GOWN STRL REUS W/TWL LRG LVL3 (GOWN DISPOSABLE) ×2 IMPLANT
GOWN STRL REUS W/TWL XL LVL3 (GOWN DISPOSABLE) ×1 IMPLANT
HANDPIECE INTERPULSE COAX TIP (DISPOSABLE) ×2
IMMOBILIZER KNEE 20 (SOFTGOODS) ×2
IMMOBILIZER KNEE 20 THIGH 36 (SOFTGOODS) ×1 IMPLANT
MANIFOLD NEPTUNE II (INSTRUMENTS) ×2 IMPLANT
NS IRRIG 1000ML POUR BTL (IV SOLUTION) ×2 IMPLANT
PACK TOTAL KNEE CUSTOM (KITS) ×2 IMPLANT
PADDING CAST COTTON 6X4 STRL (CAST SUPPLIES) ×4 IMPLANT
PATELLA DOME PFC 41MM (Knees) ×1 IMPLANT
PLATE ROT INSERT 15MM SIZE 5 (Plate) ×1 IMPLANT
POSITIONER SURGICAL ARM (MISCELLANEOUS) ×2 IMPLANT
SET HNDPC FAN SPRY TIP SCT (DISPOSABLE) ×1 IMPLANT
STRIP CLOSURE SKIN 1/2X4 (GAUZE/BANDAGES/DRESSINGS) ×2 IMPLANT
SUT MNCRL AB 4-0 PS2 18 (SUTURE) ×1 IMPLANT
SUT STRATAFIX 0 PDS 27 VIOLET (SUTURE) ×2
SUT VIC AB 2-0 CT1 27 (SUTURE) ×6
SUT VIC AB 2-0 CT1 TAPERPNT 27 (SUTURE) ×3 IMPLANT
SUTURE STRATFX 0 PDS 27 VIOLET (SUTURE) ×1 IMPLANT
SWAB COLLECTION DEVICE MRSA (MISCELLANEOUS) IMPLANT
SWAB CULTURE ESWAB REG 1ML (MISCELLANEOUS) IMPLANT
SYR 50ML LL SCALE MARK (SYRINGE) ×2 IMPLANT
TAPE STRIPS DRAPE STRL (GAUZE/BANDAGES/DRESSINGS) ×1 IMPLANT
TOWER CARTRIDGE SMART MIX (DISPOSABLE) ×1 IMPLANT
TRAY FOLEY W/METER SILVER 16FR (SET/KITS/TRAYS/PACK) ×2 IMPLANT
TUBE KAMVAC SUCTION (TUBING) IMPLANT
WATER STERILE IRR 1000ML POUR (IV SOLUTION) ×1 IMPLANT
WRAP KNEE MAXI GEL POST OP (GAUZE/BANDAGES/DRESSINGS) ×1 IMPLANT

## 2017-08-03 NOTE — Anesthesia Procedure Notes (Signed)
Anesthesia Procedure Image    

## 2017-08-03 NOTE — Progress Notes (Signed)
Assisted Dr. Rose with left, ultrasound guided, adductor canal block. Side rails up, monitors on throughout procedure. See vital signs in flow sheet. Tolerated Procedure well.  

## 2017-08-03 NOTE — Anesthesia Preprocedure Evaluation (Addendum)
Anesthesia Evaluation  Patient identified by MRN, date of birth, ID band Patient awake    Reviewed: Allergy & Precautions, NPO status , Patient's Chart, lab work & pertinent test results  Airway Mallampati: II  TM Distance: >3 FB Neck ROM: Full    Dental no notable dental hx.    Pulmonary neg pulmonary ROS, former smoker,    breath sounds clear to auscultation + decreased breath sounds      Cardiovascular hypertension, Normal cardiovascular exam Rhythm:Regular Rate:Normal     Neuro/Psych Chronic pain Chronic narcotic use negative psych ROS   GI/Hepatic Neg liver ROS, PUD, GERD  ,  Endo/Other  negative endocrine ROS  Renal/GU negative Renal ROS  negative genitourinary   Musculoskeletal negative musculoskeletal ROS (+)   Abdominal   Peds negative pediatric ROS (+)  Hematology negative hematology ROS (+)   Anesthesia Other Findings   Reproductive/Obstetrics negative OB ROS                            Anesthesia Physical Anesthesia Plan  ASA: III  Anesthesia Plan: General   Post-op Pain Management:  Regional for Post-op pain   Induction: Intravenous  PONV Risk Score and Plan: 2 and Ondansetron, Dexamethasone and Treatment may vary due to age or medical condition  Airway Management Planned: Oral ETT  Additional Equipment:   Intra-op Plan:   Post-operative Plan: Extubation in OR  Informed Consent: I have reviewed the patients History and Physical, chart, labs and discussed the procedure including the risks, benefits and alternatives for the proposed anesthesia with the patient or authorized representative who has indicated his/her understanding and acceptance.   Dental advisory given  Plan Discussed with: CRNA and Surgeon  Anesthesia Plan Comments:       Anesthesia Quick Evaluation

## 2017-08-03 NOTE — Interval H&P Note (Signed)
History and Physical Interval Note:  08/03/2017 2:34 PM  Bradley Morgan  has presented today for surgery, with the diagnosis of Unstable left total knee arthroplasty  The various methods of treatment have been discussed with the patient and family. After consideration of risks, benefits and other options for treatment, the patient has consented to  Procedure(s): Left knee polyethylene versus total knee arthroplasty revision (Left) as a surgical intervention .  The patient's history has been reviewed, patient examined, no change in status, stable for surgery.  I have reviewed the patient's chart and labs.  Questions were answered to the patient's satisfaction.     Gearlean Alf

## 2017-08-03 NOTE — Brief Op Note (Signed)
08/03/2017  4:06 PM  PATIENT:  Bradley Morgan  66 y.o. male  PRE-OPERATIVE DIAGNOSIS:  Unstable left total knee arthroplasty  POST-OPERATIVE DIAGNOSIS:  Unstable left total knee arthroplasty  PROCEDURE:  Procedure(s) with comments: Left knee tibia polyethylene and patella revision (Left) - with abductor block  SURGEON:  Surgeon(s) and Role:    Gaynelle Arabian, MD - Primary  PHYSICIAN ASSISTANT:   ASSISTANTS: Arlee Muslim, PA-C   ANESTHESIA:   general and adductor canal block  EBL:  50 mL   BLOOD ADMINISTERED:none  DRAINS: (Medium) Hemovact drain(s) in the left knee with  Suction Open   LOCAL MEDICATIONS USED:  OTHER Exparel  COUNTS:  YES  TOURNIQUET:  40 minutes @ 300 mm HG  DICTATION: .Other Dictation: Dictation Number [id number]  PLAN OF CARE: Admit to inpatient   PATIENT DISPOSITION:  PACU - hemodynamically stable.

## 2017-08-03 NOTE — Anesthesia Postprocedure Evaluation (Signed)
Anesthesia Post Note  Patient: Bradley Morgan  Procedure(s) Performed: Left knee tibia polyethylene and patella revision (Left Knee)     Patient location during evaluation: PACU Anesthesia Type: General Level of consciousness: awake and alert Pain management: pain level controlled Vital Signs Assessment: post-procedure vital signs reviewed and stable Respiratory status: spontaneous breathing, nonlabored ventilation, respiratory function stable and patient connected to nasal cannula oxygen Cardiovascular status: blood pressure returned to baseline and stable Postop Assessment: no apparent nausea or vomiting Anesthetic complications: no    Last Vitals:  Vitals:   08/03/17 1715 08/03/17 1730  BP: (!) 162/98 (!) 162/96  Pulse: 73 75  Resp: 16 17  Temp:    SpO2: 100% 98%    Last Pain:  Vitals:   08/03/17 1730  TempSrc:   PainSc: Asleep                 Bolton Canupp S

## 2017-08-03 NOTE — Discharge Instructions (Addendum)
°  ° °Dr. Frank Aluisio °Total Joint Specialist °[facility] °[address] °[place], [place] [address] °[phone] ° °TOTAL KNEE REPLACEMENT POSTOPERATIVE DIRECTIONS ° °Knee Rehabilitation, Guidelines Following Surgery  °Results after knee surgery are often greatly improved when you follow the exercise, range of motion and muscle strengthening exercises prescribed by your doctor. Safety measures are also important to protect the knee from further injury. Any time any of these exercises cause you to have increased pain or swelling in your knee joint, decrease the amount until you are comfortable again and slowly increase them. If you have problems or questions, call your caregiver or physical therapist for advice.  ° °HOME CARE INSTRUCTIONS  °Remove items at home which could result in a fall. This includes throw rugs or furniture in walking pathways.  °· ICE to the affected knee every three hours for 30 minutes at a time and then as needed for pain and swelling.  Continue to use ice on the knee for pain and swelling from surgery. You may notice swelling that will progress down to the foot and ankle.  This is normal after surgery.  Elevate the leg when you are not up walking on it.   °· Continue to use the breathing machine which will help keep your temperature down.  It is common for your temperature to cycle up and down following surgery, especially at night when you are not up moving around and exerting yourself.  The breathing machine keeps your lungs expanded and your temperature down. °· Do not place pillow under knee, focus on keeping the knee straight while resting ° °DIET °You may resume your previous home diet once your are discharged from the hospital. ° °DRESSING / WOUND CARE / SHOWERING °You may shower 3 days after surgery, but keep the wounds dry during showering.  You may use an occlusive plastic wrap (Press'n Seal for example), NO SOAKING/SUBMERGING IN THE BATHTUB.  If the  bandage gets wet, change with a clean dry gauze.  If the incision gets wet, pat the wound dry with a clean towel. °You may start showering once you are discharged home but do not submerge the incision under water. Just pat the incision dry and apply a dry gauze dressing on daily. °Change the surgical dressing daily and reapply a dry dressing each time. ° °ACTIVITY °Walk with your walker as instructed. °Use walker as long as suggested by your caregivers. °Avoid periods of inactivity such as sitting longer than an hour when not asleep. This helps prevent blood clots.  °You may resume a sexual relationship in one month or when given the OK by your doctor.  °You may return to work once you are cleared by your doctor.  °Do not drive a car for 6 weeks or until released by you surgeon.  °Do not drive while taking narcotics. ° °WEIGHT BEARING °Weight bearing as tolerated with assist device (walker, cane, etc) as directed, use it as long as suggested by your surgeon or therapist, typically at least 4-6 weeks. ° °POSTOPERATIVE CONSTIPATION PROTOCOL °Constipation - defined medically as fewer than three stools per week and severe constipation as less than one stool per week. ° °One of the most common issues patients have following surgery is constipation.  Even if you have a regular bowel pattern at home, your normal regimen is likely to be disrupted due to multiple reasons following surgery.  Combination of anesthesia, postoperative narcotics, change in appetite and fluid intake all can affect your   bowels.  In order to avoid complications following surgery, here are some recommendations in order to help you during your recovery period. ° °Colace (docusate) - Pick up an over-the-counter form of Colace or another stool softener and take twice a day as long as you are requiring postoperative pain medications.  Take with a full glass of water daily.  If you experience loose stools or diarrhea, hold the colace until you stool forms  back up.  If your symptoms do not get better within 1 week or if they get worse, check with your doctor. ° °Dulcolax (bisacodyl) - Pick up over-the-counter and take as directed by the product packaging as needed to assist with the movement of your bowels.  Take with a full glass of water.  Use this product as needed if not relieved by Colace only.  ° °MiraLax (polyethylene glycol) - Pick up over-the-counter to have on hand.  MiraLax is a solution that will increase the amount of water in your bowels to assist with bowel movements.  Take as directed and can mix with a glass of water, juice, soda, coffee, or tea.  Take if you go more than two days without a movement. °Do not use MiraLax more than once per day. Call your doctor if you are still constipated or irregular after using this medication for 7 days in a row. ° °If you continue to have problems with postoperative constipation, please contact the office for further assistance and recommendations.  If you experience "the worst abdominal pain ever" or develop nausea or vomiting, please contact the office immediatly for further recommendations for treatment. ° °ITCHING ° If you experience itching with your medications, try taking only a single pain pill, or even half a pain pill at a time.  You can also use Benadryl over the counter for itching or also to help with sleep.  ° °TED HOSE STOCKINGS °Wear the elastic stockings on both legs for three weeks following surgery during the day but you may remove then at night for sleeping. ° °MEDICATIONS °See your medication summary on the “After Visit Summary” that the nursing staff will review with you prior to discharge.  You may have some home medications which will be placed on hold until you complete the course of blood thinner medication.  It is important for you to complete the blood thinner medication as prescribed by your surgeon.  Continue your approved medications as instructed at time of  discharge. ° °PRECAUTIONS °If you experience chest pain or shortness of breath - call 911 immediately for transfer to the hospital emergency department.  °If you develop a fever greater that 101 F, purulent drainage from wound, increased redness or drainage from wound, foul odor from the wound/dressing, or calf pain - CONTACT YOUR SURGEON.   °                                                °FOLLOW-UP APPOINTMENTS °Make sure you keep all of your appointments after your operation with your surgeon and caregivers. You should call the office at the above phone number and make an appointment for approximately two weeks after the date of your surgery or on the date instructed by your surgeon outlined in the "After Visit Summary". ° ° °RANGE OF MOTION AND STRENGTHENING EXERCISES  °Rehabilitation of the knee is important following a knee   injury or an operation. After just a few days of immobilization, the muscles of the thigh which control the knee become weakened and shrink (atrophy). Knee exercises are designed to build up the tone and strength of the thigh muscles and to improve knee motion. Often times heat used for twenty to thirty minutes before working out will loosen up your tissues and help with improving the range of motion but do not use heat for the first two weeks following surgery. These exercises can be done on a training (exercise) mat, on the floor, on a table or on a bed. Use what ever works the best and is most comfortable for you Knee exercises include:  °Leg Lifts - While your knee is still immobilized in a splint or cast, you can do straight leg raises. Lift the leg to 60 degrees, hold for 3 sec, and slowly lower the leg. Repeat 10-20 times 2-3 times daily. Perform this exercise against resistance later as your knee gets better.  °Quad and Hamstring Sets - Tighten up the muscle on the front of the thigh (Quad) and hold for 5-10 sec. Repeat this 10-20 times hourly. Hamstring sets are done by pushing the  foot backward against an object and holding for 5-10 sec. Repeat as with quad sets.  °· Leg Slides: Lying on your back, slowly slide your foot toward your buttocks, bending your knee up off the floor (only go as far as is comfortable). Then slowly slide your foot back down until your leg is flat on the floor again. °· Angel Wings: Lying on your back spread your legs to the side as far apart as you can without causing discomfort.  °A rehabilitation program following serious knee injuries can speed recovery and prevent re-injury in the future due to weakened muscles. Contact your doctor or a physical therapist for more information on knee rehabilitation.  ° °IF YOU ARE TRANSFERRED TO A SKILLED REHAB FACILITY °If the patient is transferred to a skilled rehab facility following release from the hospital, a list of the current medications will be sent to the facility for the patient to continue.  When discharged from the skilled rehab facility, please have the facility set up the patient's Home Health Physical Therapy prior to being released. Also, the skilled facility will be responsible for providing the patient with their medications at time of release from the facility to include their pain medication, the muscle relaxants, and their blood thinner medication. If the patient is still at the rehab facility at time of the two week follow up appointment, the skilled rehab facility will also need to assist the patient in arranging follow up appointment in our office and any transportation needs. ° °MAKE SURE YOU:  °Understand these instructions.  °Get help right away if you are not doing well or get worse.  ° ° °Pick up stool softner and laxative for home use following surgery while on pain medications. °Do not submerge incision under water. °Please use good hand washing techniques while changing dressing each day. °May shower starting three days after surgery. °Please use a clean towel to pat the incision dry following  showers. °Continue to use ice for pain and swelling after surgery. °Do not use any lotions or creams on the incision until instructed by your surgeon. ° °Take Xarelto for two and a half more weeks following discharge from the hospital, then discontinue Xarelto. °Once the patient has completed the blood thinner regimen, then take a Baby 81 mg Aspirin daily   for three more weeks. ° ° °Information on my medicine - XARELTO® (Rivaroxaban) ° ° °Why was Xarelto® prescribed for you? °Xarelto® was prescribed for you to reduce the risk of blood clots forming after orthopedic surgery. The medical term for these abnormal blood clots is venous thromboembolism (VTE). ° °What do you need to know about xarelto® ? °Take your Xarelto® ONCE DAILY at the same time every day. °You may take it either with or without food. ° °If you have difficulty swallowing the tablet whole, you may crush it and mix in applesauce just prior to taking your dose. ° °Take Xarelto® exactly as prescribed by your doctor and DO NOT stop taking Xarelto® without talking to the doctor who prescribed the medication.  Stopping without other VTE prevention medication to take the place of Xarelto® may increase your risk of developing a clot. ° °After discharge, you should have regular check-up appointments with your healthcare provider that is prescribing your Xarelto®.   ° °What do you do if you miss a dose? °If you miss a dose, take it as soon as you remember on the same day then continue your regularly scheduled once daily regimen the next day. Do not take two doses of Xarelto® on the same day.  ° °Important Safety Information °A possible side effect of Xarelto® is bleeding. You should call your healthcare provider right away if you experience any of the following: °? Bleeding from an injury or your nose that does not stop. °? Unusual colored urine (red or dark brown) or unusual colored stools (red or black). °? Unusual bruising for unknown reasons. °? A serious  fall or if you hit your head (even if there is no bleeding). ° °Some medicines may interact with Xarelto® and might increase your risk of bleeding while on Xarelto®. To help avoid this, consult your healthcare provider or pharmacist prior to using any new prescription or non-prescription medications, including herbals, vitamins, non-steroidal anti-inflammatory drugs (NSAIDs) and supplements. ° °This website has more information on Xarelto®: www.xarelto.com. ° ° °

## 2017-08-03 NOTE — Anesthesia Procedure Notes (Signed)
Anesthesia Regional Block: Adductor canal block   Pre-Anesthetic Checklist: ,, timeout performed, Correct Patient, Correct Site, Correct Laterality, Correct Procedure, Correct Position, site marked, Risks and benefits discussed,  Surgical consent,  Pre-op evaluation,  At surgeon's request and post-op pain management  Laterality: Left  Prep: chloraprep       Needles:  Injection technique: Single-shot  Needle Type: Echogenic Needle     Needle Length: 9cm      Additional Needles:   Procedures:,,,, ultrasound used (permanent image in chart),,,,  Narrative:  Start time: 08/03/2017 2:21 PM End time: 08/03/2017 2:32 PM Injection made incrementally with aspirations every 5 mL.  Performed by: Personally  Anesthesiologist: Myrtie Soman, MD  Additional Notes: Patient tolerated the procedure well without complications

## 2017-08-03 NOTE — Transfer of Care (Signed)
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 (Left Knee)  Patient Location: PACU  Anesthesia Type:General  Level of Consciousness: sedated, patient cooperative and responds to stimulation  Airway & Oxygen Therapy: Patient Spontanous Breathing and Patient connected to face mask oxygen  Post-op Assessment: Report given to RN and Post -op Vital signs reviewed and stable  Post vital signs: Reviewed and stable  Last Vitals:  Vitals:   08/03/17 1434 08/03/17 1435  BP: (!) 131/99   Pulse: 87 86  Resp: (!) 21 15  Temp:    SpO2: 97% 97%    Last Pain:  Vitals:   08/03/17 1352  TempSrc: Oral      Patients Stated Pain Goal: 4 (57/47/34 0370)  Complications: No apparent anesthesia complications

## 2017-08-03 NOTE — Anesthesia Procedure Notes (Signed)
Procedure Name: Intubation Performed by: Gean Maidens, CRNA Pre-anesthesia Checklist: Patient identified, Emergency Drugs available, Suction available, Patient being monitored and Timeout performed Patient Re-evaluated:Patient Re-evaluated prior to induction Oxygen Delivery Method: Circle system utilized Preoxygenation: Pre-oxygenation with 100% oxygen Induction Type: IV induction Ventilation: Mask ventilation without difficulty and Oral airway inserted - appropriate to patient size Laryngoscope Size: Mac and 4 Grade View: Grade I Tube type: Oral Tube size: 7.5 mm Number of attempts: 1 Airway Equipment and Method: Stylet Placement Confirmation: ETT inserted through vocal cords under direct vision,  positive ETCO2,  CO2 detector and breath sounds checked- equal and bilateral Secured at: 23 cm Tube secured with: Tape Dental Injury: Teeth and Oropharynx as per pre-operative assessment

## 2017-08-04 ENCOUNTER — Encounter (HOSPITAL_COMMUNITY): Payer: Self-pay | Admitting: Orthopedic Surgery

## 2017-08-04 LAB — CBC
HCT: 36.3 % — ABNORMAL LOW (ref 39.0–52.0)
Hemoglobin: 11.6 g/dL — ABNORMAL LOW (ref 13.0–17.0)
MCH: 30.4 pg (ref 26.0–34.0)
MCHC: 32 g/dL (ref 30.0–36.0)
MCV: 95 fL (ref 78.0–100.0)
Platelets: 187 10*3/uL (ref 150–400)
RBC: 3.82 MIL/uL — AB (ref 4.22–5.81)
RDW: 13.8 % (ref 11.5–15.5)
WBC: 10.9 10*3/uL — AB (ref 4.0–10.5)

## 2017-08-04 LAB — BASIC METABOLIC PANEL
ANION GAP: 8 (ref 5–15)
BUN: 23 mg/dL — ABNORMAL HIGH (ref 6–20)
CALCIUM: 8.8 mg/dL — AB (ref 8.9–10.3)
CHLORIDE: 105 mmol/L (ref 101–111)
CO2: 25 mmol/L (ref 22–32)
Creatinine, Ser: 1.3 mg/dL — ABNORMAL HIGH (ref 0.61–1.24)
GFR calc non Af Amer: 56 mL/min — ABNORMAL LOW (ref >60)
Glucose, Bld: 147 mg/dL — ABNORMAL HIGH (ref 65–99)
Potassium: 4.3 mmol/L (ref 3.5–5.1)
Sodium: 138 mmol/L (ref 135–145)

## 2017-08-04 MED ORDER — CYCLOBENZAPRINE HCL 10 MG PO TABS: 10.0000 mg | ORAL_TABLET | Freq: Three times a day (TID) | ORAL | 0 refills | Status: DC | PRN

## 2017-08-04 MED ORDER — RIVAROXABAN 10 MG PO TABS: 10.0000 mg | ORAL_TABLET | Freq: Every day | ORAL | 0 refills | Status: DC

## 2017-08-04 MED ORDER — TRAMADOL HCL 50 MG PO TABS: 50.0000 mg | ORAL_TABLET | Freq: Four times a day (QID) | ORAL | 0 refills | Status: DC | PRN

## 2017-08-04 MED ORDER — OXYCODONE HCL 5 MG PO TABS: 5.0000 mg | ORAL_TABLET | ORAL | 0 refills | Status: DC | PRN

## 2017-08-04 MED ORDER — TRAMADOL HCL 50 MG PO TABS
50.0000 mg | ORAL_TABLET | Freq: Four times a day (QID) | ORAL | Status: DC | PRN
  Administered 2017-08-04: 100 mg via ORAL
  Filled 2017-08-04: qty 2

## 2017-08-04 NOTE — Progress Notes (Signed)
Patient discharged to home with family. Given all belongings, instructions, prescriptions, equipment. Wife present for all teaching. Escorted to pov via w/c.

## 2017-08-04 NOTE — Progress Notes (Signed)
    Durable Medical Equipment  (From admission, onward)        Start     Ordered   08/04/17 1311  For home use only DME 3 n 1  Once     08/04/17 1310   08/04/17 1310  For home use only DME Walker rolling  Once    Question:  Patient needs a walker to treat with the following condition  Answer:  S/P knee surgery   08/04/17 1309    [phone]

## 2017-08-04 NOTE — Evaluation (Signed)
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  Pt with hx of back surgery x 5  Clinical Impression  Pt s/p L TKR revision and presents with decreased L LE strength/ROM, post op pain/fatigue and ambulatory balance deficits limiting functional mobility.  Pt should progress to dc home with family assist.    Follow Up Recommendations Home health PT;DC plan and follow up therapy as arranged by surgeon    Equipment Recommendations  None recommended by PT    Recommendations for Other Services OT consult     Precautions / Restrictions Precautions Precautions: Fall;Knee Required Braces or Orthoses: Knee Immobilizer - Left Knee Immobilizer - Left: Discontinue once straight leg raise with < 10 degree lag Restrictions Weight Bearing Restrictions: No LLE Weight Bearing: Weight bearing as tolerated      Mobility  Bed Mobility Overal bed mobility: Needs Assistance Bed Mobility: Supine to Sit     Supine to sit: Min assist;Mod assist;+2 for physical assistance;+2 for safety/equipment     General bed mobility comments: cues for sequence and physical assist to manage L LE and to bring trunk to upright  Transfers Overall transfer level: Needs assistance Equipment used: Rolling walker (2 wheeled) Transfers: Sit to/from Stand Sit to Stand: Min assist;Mod assist;+2 physical assistance;+2 safety/equipment;From elevated surface Stand pivot transfers: Min assist;Mod assist;+2 physical assistance;+2 safety/equipment;From elevated surface       General transfer comment: cues for LE management and use of UEs to self assist.  Physical assist to bring wt up and fwd and to balance in initial standing  Ambulation/Gait             General Gait Details: stand/pvt bed to chair with RW only 2* pt instability on feet  Stairs             Wheelchair Mobility    Modified Rankin (Stroke Patients Only)       Balance Overall balance assessment: Needs assistance Sitting-balance support: No upper extremity supported;Feet supported Sitting balance-Leahy Scale: Fair     Standing balance support: Bilateral upper extremity supported Standing balance-Leahy Scale: Poor                               Pertinent Vitals/Pain Pain Assessment: 0-10 Pain Score: 5  Pain Location: L knee Pain Descriptors / Indicators: Aching;Sore Pain Intervention(s): Limited activity within patient's tolerance;Monitored during session;Premedicated before session;Ice applied    Home Living Family/patient expects to be discharged to:: Private residence Living Arrangements: Spouse/significant other Available Help at Discharge: Family Type of Home: House Home Access: Stairs to enter Entrance Stairs-Rails: Psychiatric nurse of Steps: 4 Home Layout: One level Home Equipment: Environmental consultant - 2 wheels;Cane - single point      Prior Function Level of Independence: Independent with assistive device(s)         Comments: Pt admits to falls at home     Hand Dominance        Extremity/Trunk Assessment   Upper Extremity Assessment Upper Extremity Assessment: Overall WFL for tasks assessed    Lower Extremity Assessment Lower Extremity Assessment: LLE deficits/detail    Cervical / Trunk Assessment Cervical / Trunk Assessment: Kyphotic  Communication   Communication: Expressive difficulties(Speech often unintelligble)  Cognition Arousal/Alertness: Awake/alert Behavior During Therapy: WFL for tasks assessed/performed Overall Cognitive Status: Within Functional  Limits for tasks assessed                                        General Comments      Exercises     Assessment/Plan    PT Assessment Patient needs continued PT services  PT Problem List Decreased strength;Decreased  range of motion;Decreased activity tolerance;Decreased balance;Decreased mobility;Decreased knowledge of use of DME;Pain;Obesity       PT Treatment Interventions DME instruction;Gait training;Stair training;Functional mobility training;Therapeutic activities;Therapeutic exercise;Balance training;Patient/family education    PT Goals (Current goals can be found in the Care Plan section)  Acute Rehab PT Goals Patient Stated Goal: Walk with less pain and without falling PT Goal Formulation: With patient Time For Goal Achievement: 08/07/17 Potential to Achieve Goals: Good    Frequency 7X/week   Barriers to discharge        Co-evaluation               AM-PAC PT "6 Clicks" Daily Activity  Outcome Measure Difficulty turning over in bed (including adjusting bedclothes, sheets and blankets)?: Unable Difficulty moving from lying on back to sitting on the side of the bed? : Unable Difficulty sitting down on and standing up from a chair with arms (e.g., wheelchair, bedside commode, etc,.)?: Unable Help needed moving to and from a bed to chair (including a wheelchair)?: A Lot Help needed walking in hospital room?: A Lot Help needed climbing 3-5 steps with a railing? : Total 6 Click Score: 8    End of Session Equipment Utilized During Treatment: Gait belt;Left knee immobilizer Activity Tolerance: Patient limited by fatigue Patient left: in chair;with call bell/phone within reach Nurse Communication: Mobility status PT Visit Diagnosis: Unsteadiness on feet (R26.81);History of falling (Z91.81);Difficulty in walking, not elsewhere classified (R26.2)    Time: 1660-6004 PT Time Calculation (min) (ACUTE ONLY): 26 min   Charges:   PT Evaluation $PT Eval Low Complexity: 1 Low PT Treatments $Therapeutic Activity: 8-22 mins   PT G Codes:        Pg [phone]   Halee Glynn 08/04/2017, 12:17 PM

## 2017-08-04 NOTE — Progress Notes (Signed)
   Subjective: 1 Day Post-Op Procedure(s) (LRB): Left knee tibia polyethylene and patella revision (Left) Patient reports pain as mild.   Patient seen in rounds by Dr. Wynelle Link.  Doing better this morning.  Will get up with therapy today and home after meets goals. Patient is well, but has had some minor complaints of pain in the knee, requiring pain medications Patient is ready to go home later today.  Objective: Vital signs in last 24 hours: Temp:  [97.5 F (36.4 C)-98.6 F (37 C)] 98.6 F (37 C) (11/15 0509) Pulse Rate:  [67-95] 89 (11/15 0509) Resp:  [14-21] 14 (11/15 0509) BP: (114-166)/(74-103) 141/74 (11/15 0509) SpO2:  [92 %-100 %] 94 % (11/15 0509) Weight:  [117.5 kg (259 lb)] 117.5 kg (259 lb) (11/14 1411)  Intake/Output from previous day:  Intake/Output Summary (Last 24 hours) at 08/04/2017 0726 Last data filed at 08/04/2017 0600 Gross per 24 hour  Intake 2770 ml  Output 1760 ml  Net 1010 ml    Intake/Output this shift: No intake/output data recorded.  Labs: Recent Labs    08/04/17 0503  HGB 11.6*   Recent Labs    08/04/17 0503  WBC 10.9*  RBC 3.82*  HCT 36.3*  PLT 187   Recent Labs    08/04/17 0503  NA 138  K 4.3  CL 105  CO2 25  BUN 23*  CREATININE 1.30*  GLUCOSE 147*  CALCIUM 8.8*   No results for input(s): LABPT, INR in the last 72 hours.  EXAM: General - Patient is Alert, Appropriate and Oriented Extremity - Neurovascular intact Sensation intact distally Dressing - clean, dry Motor Function - intact, moving foot and toes well on exam.  Hemovac pulled.  Assessment/Plan: 1 Day Post-Op Procedure(s) (LRB): Left knee tibia polyethylene and patella revision (Left) Procedure(s) (LRB): Left knee tibia polyethylene and patella revision (Left) Past Medical History:  Diagnosis Date  . Anxiety   . Arthritis   . Chronic pain    neck;lower back  . Complication of anesthesia    ANESTHESIA DID NOT TAKE EFFECT WITH BACK SURGERY "I FELT  THEM CUT"  . Dog bite    dog bite to nose; right side of nose, 1 3/4- inch superficial redenned are, no visible drainage ;   . Family history of anesthesia complication    "hard time waking up my dad" many yrs ago  . GERD (gastroesophageal reflux disease)   . History of transfusion   . Hyperlipemia   . Hypertension   . Injury    pt reports pain to right shoulder; states "i think it came apart when i stretched by my arm when waking up this morning"   . Multiple gastric ulcers    ruptured gastric ulcer 2001  . PONV (postoperative nausea and vomiting)   . Restless leg syndrome   . Rotator cuff tear    left   Principal Problem:   Failed total knee arthroplasty ([place])  Estimated body mass index is 36.12 kg/m as calculated from the following:   Height as of this encounter: 5\' 11"  (1.803 m).   Weight as of this encounter: 117.5 kg (259 lb). Advance diet Up with therapy Discharge home with home health Diet - Cardiac diet Follow up - in 2 weeks Activity - WBAT Disposition - Home Condition Upon Discharge - Stable D/C Meds - See DC Summary DVT Prophylaxis - Xarelto  Arlee Muslim, PA-C Orthopaedic Surgery 08/04/2017, 7:26 AM

## 2017-08-04 NOTE — Progress Notes (Signed)
Physical Therapy Treatment Patient Details Name: [facility] MRN: [id number] DOB: 24-Jul-1951 Today's Date: 08/04/2017    History of Present Illness Pt s/p L TKR revision - liner exchange and patella revision.  Pt with hx of back surgery x 5    PT Comments     Pt with marked improvement in activity tolerance and ambulatory stability.  Pt reviewed therex with home program, KI and stairs.   Follow Up Recommendations  DC plan and follow up therapy as arranged by surgeon;Outpatient PT     Equipment Recommendations  None recommended by PT    Recommendations for Other Services OT consult     Precautions / Restrictions Precautions Precautions: Fall;Knee Required Braces or Orthoses: Knee Immobilizer - Left Knee Immobilizer - Left: Discontinue once straight leg raise with < 10 degree lag Restrictions Weight Bearing Restrictions: No LLE Weight Bearing: Weight bearing as tolerated    Mobility  Bed Mobility Overal bed mobility: Needs Assistance Bed Mobility: Sit to Supine     Supine to sit: Min assist;Mod assist;+2 for physical assistance;+2 for safety/equipment Sit to supine: Min assist;+2 for physical assistance;+2 for safety/equipment   General bed mobility comments: Pt OOB and requests back to chair  Transfers Overall transfer level: Needs assistance Equipment used: Rolling walker (2 wheeled) Transfers: Sit to/from Stand Sit to Stand: Min guard;Supervision Stand pivot transfers: Min assist;Mod assist;+2 physical assistance;+2 safety/equipment;From elevated surface       General transfer comment: cues for LE management and use of UEs to self assist.    Ambulation/Gait Ambulation/Gait assistance: Min guard Ambulation Distance (Feet): 60 Feet Assistive device: Rolling walker (2 wheeled) Gait Pattern/deviations: Step-to pattern;Decreased step length - right;Decreased step length - left;Shuffle;Trunk flexed Gait velocity: decr Gait velocity interpretation: Below  normal speed for age/gender General Gait Details: cues for sequence, posture and position from RW.  Marked improvement in balance/stability vs earlier this date   Stairs Stairs: Yes   Stair Management: Two rails;Step to pattern;Forwards Number of Stairs: 2 General stair comments: cues for sequence and foot placement  Wheelchair Mobility    Modified Rankin (Stroke Patients Only)       Balance Overall balance assessment: Needs assistance Sitting-balance support: No upper extremity supported;Feet supported Sitting balance-Leahy Scale: Good     Standing balance support: Bilateral upper extremity supported Standing balance-Leahy Scale: Poor                              Cognition Arousal/Alertness: Awake/alert Behavior During Therapy: WFL for tasks assessed/performed Overall Cognitive Status: Within Functional Limits for tasks assessed                                        Exercises Total Joint Exercises Ankle Circles/Pumps: AROM;Both;20 reps;Supine Quad Sets: AROM;Both;10 reps;Supine Heel Slides: AAROM;Left;15 reps;Supine Straight Leg Raises: AAROM;Left;5 reps;Supine Goniometric ROM: AAROM L knee -10 - 50    General Comments        Pertinent Vitals/Pain Pain Assessment: 0-10 Pain Score: 7  Pain Location: L knee Pain Descriptors / Indicators: Aching;Sore Pain Intervention(s): Limited activity within patient's tolerance;Monitored during session;Patient requesting pain meds-RN notified;Ice applied    Home Living Family/patient expects to be discharged to:: Private residence Living Arrangements: Spouse/significant other Available Help at Discharge: Family Type of Home: House Home Access: Stairs to enter Entrance Stairs-Rails: Right;Left Home Layout: One level Home Equipment:  Walker - 2 wheels;Cane - single point      Prior Function Level of Independence: Independent with assistive device(s)      Comments: Pt admits to falls at  home   PT Goals (current goals can now be found in the care plan section) Acute Rehab PT Goals Patient Stated Goal: Walk with less pain and without falling PT Goal Formulation: With patient Time For Goal Achievement: 08/07/17 Potential to Achieve Goals: Good Progress towards PT goals: Progressing toward goals    Frequency    7X/week      PT Plan Discharge plan needs to be updated    Co-evaluation              AM-PAC PT "6 Clicks" Daily Activity  Outcome Measure  Difficulty turning over in bed (including adjusting bedclothes, sheets and blankets)?: A Lot Difficulty moving from lying on back to sitting on the side of the bed? : A Lot Difficulty sitting down on and standing up from a chair with arms (e.g., wheelchair, bedside commode, etc,.)?: A Lot Help needed moving to and from a bed to chair (including a wheelchair)?: A Little Help needed walking in hospital room?: A Little Help needed climbing 3-5 steps with a railing? : A Little 6 Click Score: 15    End of Session Equipment Utilized During Treatment: Gait belt;Left knee immobilizer Activity Tolerance: Patient tolerated treatment well Patient left: in chair;with call bell/phone within reach;with nursing/sitter in room Nurse Communication: Mobility status PT Visit Diagnosis: Unsteadiness on feet (R26.81);History of falling (Z91.81);Difficulty in walking, not elsewhere classified (R26.2)     Time: 7121-9758 PT Time Calculation (min) (ACUTE ONLY): 35 min  Charges:  $Gait Training: 8-22 mins $Therapeutic Exercise: 8-22 mins $Therapeutic Activity: 8-22 mins                    G Codes:       Pg [phone]    Elba Schaber 08/04/2017, 3:29 PM

## 2017-08-04 NOTE — Progress Notes (Signed)
Discharge planning, no HH needs identified. Plan for OP PT, needs DME. [phone]

## 2017-08-04 NOTE — Discharge Summary (Signed)
Physician Discharge Summary   Patient ID: [facility] MRN: [id number] DOB/AGE: 04/12/1951 66 y.o.  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   Admission Diagnoses:  Past Medical History:  Diagnosis Date  . Anxiety   . Arthritis   . Chronic pain    neck;lower back  . Complication of anesthesia    ANESTHESIA DID NOT TAKE EFFECT WITH BACK SURGERY "I FELT THEM CUT"  . Dog bite    dog bite to nose; right side of nose, 1 3/4- inch superficial redenned are, no visible drainage ;   . Family history of anesthesia complication    "hard time waking up my dad" many yrs ago  . GERD (gastroesophageal reflux disease)   . History of transfusion   . Hyperlipemia   . Hypertension   . Injury    pt reports pain to right shoulder; states "i think it came apart when i stretched by my arm when waking up this morning"   . Multiple gastric ulcers    ruptured gastric ulcer 2001  . PONV (postoperative nausea and vomiting)   . Restless leg syndrome   . Rotator cuff tear    left   Discharge Diagnoses:   Principal Problem:   Failed total knee arthroplasty ([place])  Estimated body mass index is 36.12 kg/m as calculated from the following:   Height as of this encounter: 5' 11"  (1.803 m).   Weight as of this encounter: 117.5 kg (259 lb).  Procedure:  Procedure(s) (LRB): Left knee tibia polyethylene and patella revision (Left)   Consults: None  HPI: [facility] is a 66 year old male, had a left total knee arthroplasty done in 2003.  Had done well until recently when he started to get pain and unstable feeling in the knee.  He has not had any rest pain at all.  He does not have any significant swelling.  He did have instability on exam.  He presents now for polyethylene versus total knee revision.    Laboratory Data: Admission on 08/03/2017  Component Date Value Ref Range Status  . WBC  08/04/2017 10.9* 4.0 - 10.5 K/uL Final  . RBC 08/04/2017 3.82* 4.22 - 5.81 MIL/uL Final  . Hemoglobin 08/04/2017 11.6* 13.0 - 17.0 g/dL Final  . HCT 08/04/2017 36.3* 39.0 - 52.0 % Final  . MCV 08/04/2017 95.0  78.0 - 100.0 fL Final  . MCH 08/04/2017 30.4  26.0 - 34.0 pg Final  . MCHC 08/04/2017 32.0  30.0 - 36.0 g/dL Final  . RDW 08/04/2017 13.8  11.5 - 15.5 % Final  . Platelets 08/04/2017 187  150 - 400 K/uL Final  . Sodium 08/04/2017 138  135 - 145 mmol/L Final  . Potassium 08/04/2017 4.3  3.5 - 5.1 mmol/L Final  . Chloride 08/04/2017 105  101 - 111 mmol/L Final  . CO2 08/04/2017 25  22 - 32 mmol/L Final  . Glucose, Bld 08/04/2017 147* 65 - 99 mg/dL Final  . BUN 08/04/2017 23* 6 - 20 mg/dL Final  . Creatinine, Ser 08/04/2017 1.30* 0.61 - 1.24 mg/dL Final  . Calcium 08/04/2017 8.8* 8.9 - 10.3 mg/dL Final  . GFR calc non Af Amer 08/04/2017 56* >60 mL/min Final  . GFR calc Af Amer 08/04/2017 >60  >60 mL/min Final   Comment: (NOTE) The eGFR has been calculated using the CKD EPI equation. This calculation has not been validated in all clinical situations. eGFR's persistently <60  mL/min signify possible Chronic Kidney Disease.   Georgiann Hahn gap 08/04/2017 8  5 - 15 Final  [facility] Visit on 07/29/2017  Component Date Value Ref Range Status  . MRSA, PCR 07/29/2017 NEGATIVE  NEGATIVE Final  . Staphylococcus aureus 07/29/2017 NEGATIVE  NEGATIVE Final   Comment: (NOTE) The Xpert SA Assay (FDA approved for NASAL specimens in patients 54 years of age and older), is one component of a comprehensive surveillance program. It is not intended to diagnose infection nor to guide or monitor treatment.   Marland Kitchen aPTT 07/29/2017 29  24 - 36 seconds Final  . WBC 07/29/2017 8.0  4.0 - 10.5 K/uL Final  . RBC 07/29/2017 4.09* 4.22 - 5.81 MIL/uL Final  . Hemoglobin 07/29/2017 12.3* 13.0 - 17.0 g/dL Final  . HCT 07/29/2017 38.7* 39.0 - 52.0 % Final  . MCV 07/29/2017 94.6  78.0 - 100.0 fL Final  . MCH  07/29/2017 30.1  26.0 - 34.0 pg Final  . MCHC 07/29/2017 31.8  30.0 - 36.0 g/dL Final  . RDW 07/29/2017 13.7  11.5 - 15.5 % Final  . Platelets 07/29/2017 192  150 - 400 K/uL Final  . Sodium 07/29/2017 142  135 - 145 mmol/L Final  . Potassium 07/29/2017 4.7  3.5 - 5.1 mmol/L Final  . Chloride 07/29/2017 108  101 - 111 mmol/L Final  . CO2 07/29/2017 25  22 - 32 mmol/L Final  . Glucose, Bld 07/29/2017 96  65 - 99 mg/dL Final  . BUN 07/29/2017 22* 6 - 20 mg/dL Final  . Creatinine, Ser 07/29/2017 0.97  0.61 - 1.24 mg/dL Final  . Calcium 07/29/2017 9.4  8.9 - 10.3 mg/dL Final  . Total Protein 07/29/2017 6.6  6.5 - 8.1 g/dL Final  . Albumin 07/29/2017 4.1  3.5 - 5.0 g/dL Final  . AST 07/29/2017 18  15 - 41 U/L Final  . ALT 07/29/2017 14* 17 - 63 U/L Final  . Alkaline Phosphatase 07/29/2017 92  38 - 126 U/L Final  . Total Bilirubin 07/29/2017 0.3  0.3 - 1.2 mg/dL Final  . GFR calc non Af Amer 07/29/2017 >60  >60 mL/min Final  . GFR calc Af Amer 07/29/2017 >60  >60 mL/min Final   Comment: (NOTE) The eGFR has been calculated using the CKD EPI equation. This calculation has not been validated in all clinical situations. eGFR's persistently <60 mL/min signify possible Chronic Kidney Disease.   . Anion gap 07/29/2017 9  5 - 15 Final  . Prothrombin Time 07/29/2017 12.3  11.4 - 15.2 seconds Final  . INR 07/29/2017 0.92   Final  . ABO/RH(D) 07/29/2017 A POS   Final  . Antibody Screen 07/29/2017 NEG   Final  . Sample Expiration 07/29/2017 08/06/2017   Final  . Extend sample reason 07/29/2017 NO TRANSFUSIONS OR PREGNANCY IN THE PAST 3 MONTHS   Final  . ABO/RH(D) 07/29/2017 A POS   Final     X-Rays:No results found.  EKG: Orders placed or performed during the [facility] encounter of 07/29/17  . EKG 12 lead  . EKG 12 lead     [facility] Course: [facility] is a 66 y.o. who was admitted to [facility]. They were brought to the operating room on 08/03/2017 and underwent  Procedure(s): Left knee tibia polyethylene and patella revision.  Patient tolerated the procedure well and was later transferred to the recovery room and then to the orthopaedic floor for postoperative care.  They were given PO and IV  analgesics for pain control following their surgery.  They were given 24 hours of postoperative antibiotics of  Anti-infectives (From admission, onward)   Start     Dose/Rate Route Frequency Ordered Stop   08/03/17 2100  ceFAZolin (ANCEF) IVPB 2g/100 mL premix     2 g 200 mL/hr over 30 Minutes Intravenous Every 6 hours 08/03/17 1836 08/04/17 0323   08/03/17 1330  ceFAZolin (ANCEF) IVPB 2g/100 mL premix     2 g 200 mL/hr over 30 Minutes Intravenous On call to O.R. 08/03/17 1325 08/03/17 1530   08/03/17 1328  ceFAZolin (ANCEF) 2-4 GM/100ML-% IVPB    Comments:  [place] Session   : cabinet override      08/03/17 1328 08/03/17 1500     and started on DVT prophylaxis in the form of Xarelto.   PT and OT were ordered for total joint protocol.  Discharge planning consulted to help with postop disposition and equipment needs.  Patient had a good night on the evening of surgery.  They started to get up OOB with therapy on day one. Hemovac drain was pulled without difficulty. Dressing was checked and was clean and dry. Patient was seen in rounds and was ready to go home later on day one.  Discharge home with home health Diet - Cardiac diet Follow up - in 2 weeks Activity - WBAT Disposition - Home Condition Upon Discharge - Stable D/C Meds - See DC Summary DVT Prophylaxis - Xarelto     Discharge Instructions    Call MD / Call 911   Complete by:  As directed    If you experience chest pain or shortness of breath, CALL 911 and be transported to the [facility] emergency room.  If you develope a fever above 101 F, pus (white drainage) or increased drainage or redness at the wound, or calf pain, call your surgeon's office.   Change dressing   Complete by:  As directed     Change dressing daily with sterile 4 x 4 inch gauze dressing and apply TED hose. Do not submerge the incision under water.   Constipation Prevention   Complete by:  As directed    Drink plenty of fluids.  Prune juice may be helpful.  You may use a stool softener, such as Colace (over the counter) 100 mg twice a day.  Use MiraLax (over the counter) for constipation as needed.   Diet - low sodium heart healthy   Complete by:  As directed    Discharge instructions   Complete by:  As directed    Take Xarelto for two and a half more weeks, then discontinue Xarelto. Once the patient has completed the blood thinner regimen, then take a Baby 81 mg Aspirin daily for three more weeks.   Pick up stool softner and laxative for home use following surgery while on pain medications. Do not submerge incision under water. Please use good hand washing techniques while changing dressing each day. May shower starting three days after surgery. Please use a clean towel to pat the incision dry following showers. Continue to use ice for pain and swelling after surgery. Do not use any lotions or creams on the incision until instructed by your surgeon.  Wear both TED hose on both legs during the day every day for three weeks, but may remove the TED hose at night at home.  Postoperative Constipation Protocol  Constipation - defined medically as fewer than three stools per week and severe constipation as less than  one stool per week.  One of the most common issues patients have following surgery is constipation.  Even if you have a regular bowel pattern at home, your normal regimen is likely to be disrupted due to multiple reasons following surgery.  Combination of anesthesia, postoperative narcotics, change in appetite and fluid intake all can affect your bowels.  In order to avoid complications following surgery, here are some recommendations in order to help you during your recovery period.  Colace (docusate) -  Pick up an over-the-counter form of Colace or another stool softener and take twice a day as long as you are requiring postoperative pain medications.  Take with a full glass of water daily.  If you experience loose stools or diarrhea, hold the colace until you stool forms back up.  If your symptoms do not get better within 1 week or if they get worse, check with your doctor.  Dulcolax (bisacodyl) - Pick up over-the-counter and take as directed by the product packaging as needed to assist with the movement of your bowels.  Take with a full glass of water.  Use this product as needed if not relieved by Colace only.   MiraLax (polyethylene glycol) - Pick up over-the-counter to have on hand.  MiraLax is a solution that will increase the amount of water in your bowels to assist with bowel movements.  Take as directed and can mix with a glass of water, juice, soda, coffee, or tea.  Take if you go more than two days without a movement. Do not use MiraLax more than once per day. Call your doctor if you are still constipated or irregular after using this medication for 7 days in a row.  If you continue to have problems with postoperative constipation, please contact the office for further assistance and recommendations.  If you experience "the worst abdominal pain ever" or develop nausea or vomiting, please contact the office immediatly for further recommendations for treatment.   Do not put a pillow under the knee. Place it under the heel.   Complete by:  As directed    Do not sit on low chairs, stoools or toilet seats, as it may be difficult to get up from low surfaces   Complete by:  As directed    Driving restrictions   Complete by:  As directed    No driving until released by the physician.   Increase activity slowly as tolerated   Complete by:  As directed    Lifting restrictions   Complete by:  As directed    No lifting until released by the physician.   Patient may shower   Complete by:  As  directed    You may shower without a dressing once there is no drainage.  Do not wash over the wound.  If drainage remains, do not shower until drainage stops.   TED hose   Complete by:  As directed    Use stockings (TED hose) for 3 weeks on both leg(s).  You may remove them at night for sleeping.   Weight bearing as tolerated   Complete by:  As directed    Laterality:  left   Extremity:  Lower     Allergies as of 08/04/2017      Reactions   Morphine And Related Shortness Of Breath   Aspirin Other (See Comments)   No high doses      Medication List    STOP taking these medications   HYDROcodone-acetaminophen 10-325 MG tablet  Commonly known as:  NORCO   ondansetron 4 MG tablet Commonly known as:  ZOFRAN   oxyCODONE-acetaminophen 10-325 MG tablet Commonly known as:  PERCOCET     TAKE these medications   carvedilol 3.125 MG tablet Commonly known as:  COREG Take 3.125 mg 2 (two) times daily with a meal by mouth.   CLARITIN-D 12 HOUR PO Take 1 tablet daily as needed by mouth (for allergies).   clonazePAM 0.5 MG tablet Commonly known as:  KLONOPIN Take 0.5 mg 3 (three) times daily as needed by mouth for anxiety.   cyclobenzaprine 10 MG tablet Commonly known as:  FLEXERIL Take 1 tablet (10 mg total) 3 (three) times daily as needed by mouth for muscle spasms.   doxycycline 100 MG capsule Commonly known as:  VIBRAMYCIN Take 100 mg 2 (two) times daily by mouth.   gabapentin 300 MG capsule Commonly known as:  NEURONTIN Take 300-600 mg See admin instructions by mouth. Take 300 mg by mouth in the morning, take 300 mg by mouth in the afternoon, take 300 mg by mouth at supper and take 600 mg by mouth at bedtime   lisinopril 10 MG tablet Commonly known as:  PRINIVIL,ZESTRIL Take 10 mg by mouth at bedtime.   oxyCODONE 5 MG immediate release tablet Commonly known as:  Oxy IR/ROXICODONE Take 1-2 tablets (5-10 mg total) every 4 (four) hours as needed by mouth for moderate  pain or severe pain.   pantoprazole 40 MG tablet Commonly known as:  PROTONIX Take 40 mg daily by mouth.   rivaroxaban 10 MG Tabs tablet Commonly known as:  XARELTO Take 1 tablet (10 mg total) daily with breakfast by mouth. Take Xarelto for two and a half more weeks following discharge from the [facility], then discontinue Xarelto. Once the patient has completed the blood thinner regimen, then take a Baby 81 mg Aspirin daily for three more weeks.   rOPINIRole 1 MG tablet Commonly known as:  REQUIP Take 1 mg See admin instructions by mouth. Take 1 mg by mouth at bedtime. May take additional 1 mg if needed for restless leg   simvastatin 40 MG tablet Commonly known as:  ZOCOR Take 40 mg daily by mouth.   traMADol 50 MG tablet Commonly known as:  ULTRAM Take 1-2 tablets (50-100 mg total) every 6 (six) hours as needed by mouth for moderate pain.            Discharge Care Instructions  (From admission, onward)        Start     Ordered   08/04/17 0000  Weight bearing as tolerated    Question Answer Comment  Laterality left   Extremity Lower      08/04/17 0733   08/04/17 0000  Change dressing    Comments:  Change dressing daily with sterile 4 x 4 inch gauze dressing and apply TED hose. Do not submerge the incision under water.   08/04/17 8182     Follow-up Information    Gaynelle Arabian, MD. Schedule an appointment as soon as possible for a visit on 08/16/2017.   Specialty:  Orthopedic Surgery Contact information: [address] [place] [address] [phone]           Signed: Arlee Muslim, PA-C Orthopaedic Surgery 08/04/2017, 7:34 AM

## 2017-08-04 NOTE — Op Note (Signed)
NAME:  AKSHAT, MINEHART NO.:  [id number]  MEDICAL RECORD NO.:  [id number]  LOCATION:                                 FACILITY:  PHYSICIAN:  Gaynelle Arabian, M.D.         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  PROCEDURE:  Left knee tibial polyethylene and patellar revision.  SURGEON:  Gaynelle Arabian, M.D.  ASSISTANT:  [name]. [name], P.A.C.  ANESTHESIA:  General and adductor canal block.  ESTIMATED BLOOD LOSS:  50 mL.  DRAINS:  Hemovac x1.  TOURNIQUET TIME:  40 minutes at 300 mmHg.  COMPLICATIONS:  None.  CONDITION:  Stable to recovery.  BRIEF CLINICAL NOTE:  Mr. Boehle is a 66 year old male, had a left total knee arthroplasty done in 2003.  Had done well until recently when he started to get pain and unstable feeling in the knee.  He has not had any rest pain at all.  He does not have any significant swelling.  He did have instability on exam.  He presents now for polyethylene versus total knee revision.  PROCEDURE IN DETAIL:  After successful administration of adductor canal block and general anesthetic, a tourniquet was placed high on the left thigh, and left lower extremity prepped and draped in usual sterile fashion.  Extremities wrapped in Esmarch and the tourniquet was inflated to 300 mmHg.  A midline incision was made with a 10-blade through the subcutaneous tissue to the extensor mechanism.  A fresh blade was used to make a medial parapatellar arthrotomy.  Upon entering the joint, it was noted that there was a large amount of polyethylene wear debris and actual was small flakes of polyethylene in the synovial tissue.  A thorough synovectomy was performed to remove all of  the abnormal tissue. The joint was inspected and we everted the patella and there was a large groove transversely across the patella consistent with polyethylene wear.  At this point, I knew I needed to at least revise the patella. We then everted the patella and flexed the knee 90 degrees.  There was instability both in varus-valgus and anterior-posterior planes.  I was able to sublux the tibia forward to remove the polyethylene.  A 10-mm thick polyethylene had been in place for the size 5 tibia and femur.  I trialed the 15, which allowed for reduction with full extension and excellent varus-valgus balance and anterior-posterior balance throughout full range of motion.  We removed the trial and opened the permanent 15- mm rotating platform posterior stabilized insert for the MBT tray.  It was placed into the tray and the knee was reduced with outstanding stability throughout full range of motion.  I then everted the patella and trimmed all soft tissue from around the patellar component.  Oscillating saw was used to disrupt the interface between the patellar component and bone.  The component was removed. Residual thickness of bone was 15 mm.  I removed the  patellar lugs.  We then placed the 41-mm template and drilled three new lug holes.  The trial patella fits perfectly and tracks normally.  The cement was mixed and once ready for implantation, the size 41 patella was cemented and held it in place the patellar clamp.  I then completed the synovectomy. Subsequently thoroughly irrigated the joint with a liter of saline using pulsatile lavage.  The Exparel 20 mL was mixed with 60 mL of saline, was injected through the extensor mechanism, subcutaneous tissues, periosteum of the femur and capsule of the knee.  I then removed the patellar clamp and closed the arthrotomy over Hemovac drain with running #1 Stratafix suture.  Tourniquet was released for total time of 40 minutes.  Subcu was then  closed with interrupted 2-0 Vicryl and subcuticular running 4-0 Monocryl.  Incision was cleaned and dried, and Steri-Strips and a bulky sterile dressing applied.  He was then awakened and transported to recovery in stable condition.  Note that a surgical assistant is a medical necessity for this procedure.  Assistant was necessary for retraction of vital ligaments, neurovascular structures, also for proper positioning of the knee in order to remove the old polyethylene and to place the new polyethylene.     Gaynelle Arabian, M.D.     FA/MEDQ  D:  08/03/2017  T:  08/04/2017  Job:  [id number]

## 2017-08-04 NOTE — Progress Notes (Signed)
Physical Therapy Treatment Patient Details Name: Bradley Morgan MRN: [id number] DOB: 06-May-1951 Today's Date: 08/04/2017    History of Present Illness Pt s/p L TKR revision - liner exchange and patella revision.  Pt with hx of back surgery x 5    PT Comments    Pt demonstrating increased activity tolerance at second session but continues very unsteady on feet (at high risk for falling) and fatigues easily.  Follow Up Recommendations  Home health PT;DC plan and follow up therapy as arranged by surgeon     Equipment Recommendations  None recommended by PT    Recommendations for Other Services OT consult     Precautions / Restrictions Precautions Precautions: Fall;Knee Required Braces or Orthoses: Knee Immobilizer - Left Knee Immobilizer - Left: Discontinue once straight leg raise with < 10 degree lag Restrictions Weight Bearing Restrictions: No LLE Weight Bearing: Weight bearing as tolerated    Mobility  Bed Mobility Overal bed mobility: Needs Assistance Bed Mobility: Sit to Supine     Supine to sit: Min assist;Mod assist;+2 for physical assistance;+2 for safety/equipment Sit to supine: Min assist;+2 for physical assistance;+2 for safety/equipment   General bed mobility comments: cues for sequence and physical assist to manage L LE  Transfers Overall transfer level: Needs assistance Equipment used: Rolling walker (2 wheeled) Transfers: Sit to/from Stand Sit to Stand: Min assist;Mod assist;+2 physical assistance;+2 safety/equipment Stand pivot transfers: Min assist;Mod assist;+2 physical assistance;+2 safety/equipment;From elevated surface       General transfer comment: cues for LE management and use of UEs to self assist.  Physical assist to bring wt up and fwd and to balance in initial standing  Ambulation/Gait Ambulation/Gait assistance: Min assist;Mod assist;+2 safety/equipment Ambulation Distance (Feet): 36 Feet Assistive device: Rolling walker (2  wheeled) Gait Pattern/deviations: Step-to pattern;Decreased step length - right;Decreased step length - left;Shuffle;Trunk flexed Gait velocity: decr   General Gait Details: cues for sequence, posture and position from RW.  Physical assist for balance/support   Stairs            Wheelchair Mobility    Modified Rankin (Stroke Patients Only)       Balance Overall balance assessment: Needs assistance Sitting-balance support: No upper extremity supported;Feet supported Sitting balance-Leahy Scale: Good     Standing balance support: Bilateral upper extremity supported Standing balance-Leahy Scale: Poor                              Cognition Arousal/Alertness: Awake/alert Behavior During Therapy: WFL for tasks assessed/performed Overall Cognitive Status: Within Functional Limits for tasks assessed                                        Exercises      General Comments        Pertinent Vitals/Pain Pain Assessment: 0-10 Pain Score: 4  Pain Location: L knee Pain Descriptors / Indicators: Aching;Sore Pain Intervention(s): Limited activity within patient's tolerance;Monitored during session;Premedicated before session;Ice applied    Home Living Family/patient expects to be discharged to:: Private residence Living Arrangements: Spouse/significant other Available Help at Discharge: Family Type of Home: House Home Access: Stairs to enter Entrance Stairs-Rails: Right;Left Home Layout: One level Home Equipment: Environmental consultant - 2 wheels;Cane - single point      Prior Function Level of Independence: Independent with assistive device(s)      Comments: Pt  admits to falls at home   PT Goals (current goals can now be found in the care plan section) Acute Rehab PT Goals Patient Stated Goal: Walk with less pain and without falling PT Goal Formulation: With patient Time For Goal Achievement: 08/07/17 Potential to Achieve Goals: Good Progress towards  PT goals: Progressing toward goals    Frequency    7X/week      PT Plan Current plan remains appropriate    Co-evaluation              AM-PAC PT "6 Clicks" Daily Activity  Outcome Measure  Difficulty turning over in bed (including adjusting bedclothes, sheets and blankets)?: Unable Difficulty moving from lying on back to sitting on the side of the bed? : Unable Difficulty sitting down on and standing up from a chair with arms (e.g., wheelchair, bedside commode, etc,.)?: Unable Help needed moving to and from a bed to chair (including a wheelchair)?: A Lot Help needed walking in hospital room?: A Lot Help needed climbing 3-5 steps with a railing? : Total 6 Click Score: 8    End of Session Equipment Utilized During Treatment: Gait belt;Left knee immobilizer Activity Tolerance: Patient limited by fatigue Patient left: in bed;with call bell/phone within reach;with bed alarm set Nurse Communication: Mobility status PT Visit Diagnosis: Unsteadiness on feet (R26.81);History of falling (Z91.81);Difficulty in walking, not elsewhere classified (R26.2)     Time: 3818-2993 PT Time Calculation (min) (ACUTE ONLY): 18 min  Charges:  $Gait Training: 8-22 mins $Therapeutic Activity: 8-22 mins                    G Codes:       Pg [phone]    [name] 08/04/2017, 12:26 PM

## 2017-08-04 NOTE — Progress Notes (Signed)
OT Cancellation Note  Patient Details Name: Bradley Morgan MRN: [id number] DOB: 23-Dec-1950   Cancelled Treatment:    Reason Eval/Treat Not Completed: Other (comment)  Discussed with PT.  Pt not ready for OT.  Will check on pt next day. Kari Baars, [place]  Payton Mccallum D 08/04/2017, 12:36 PM

## 2017-09-24 ENCOUNTER — Encounter (HOSPITAL_COMMUNITY): Payer: Self-pay | Admitting: Emergency Medicine

## 2017-09-24 ENCOUNTER — Observation Stay (HOSPITAL_COMMUNITY)
Admission: EM | Admit: 2017-09-24 | Discharge: 2017-09-25 | Disposition: A | Discharge status: Home / Self Care | Payer: Medicare Other | Attending: Urology | Admitting: Urology

## 2017-09-24 ENCOUNTER — Emergency Department (HOSPITAL_COMMUNITY): Payer: Medicare Other | Admitting: Certified Registered Nurse Anesthetist

## 2017-09-24 ENCOUNTER — Other Ambulatory Visit: Payer: Self-pay

## 2017-09-24 ENCOUNTER — Emergency Department (HOSPITAL_COMMUNITY): Payer: Medicare Other

## 2017-09-24 ENCOUNTER — Inpatient Hospital Stay (HOSPITAL_COMMUNITY): Payer: Medicare Other

## 2017-09-24 ENCOUNTER — Encounter (HOSPITAL_COMMUNITY)
Admission: EM | Disposition: A | Discharge status: Home / Self Care | Payer: Self-pay | Source: Home / Self Care | Attending: Urology

## 2017-09-24 DIAGNOSIS — F419 Anxiety disorder, unspecified: Secondary | ICD-10-CM | POA: Insufficient documentation

## 2017-09-24 DIAGNOSIS — K219 Gastro-esophageal reflux disease without esophagitis: Secondary | ICD-10-CM | POA: Insufficient documentation

## 2017-09-24 DIAGNOSIS — N179 Acute kidney failure, unspecified: Secondary | ICD-10-CM | POA: Diagnosis not present

## 2017-09-24 DIAGNOSIS — M542 Cervicalgia: Secondary | ICD-10-CM | POA: Diagnosis not present

## 2017-09-24 DIAGNOSIS — M1990 Unspecified osteoarthritis, unspecified site: Secondary | ICD-10-CM | POA: Insufficient documentation

## 2017-09-24 DIAGNOSIS — Z419 Encounter for procedure for purposes other than remedying health state, unspecified: Secondary | ICD-10-CM

## 2017-09-24 DIAGNOSIS — Z6835 Body mass index (BMI) 35.0-35.9, adult: Secondary | ICD-10-CM | POA: Diagnosis not present

## 2017-09-24 DIAGNOSIS — N1330 Unspecified hydronephrosis: Secondary | ICD-10-CM

## 2017-09-24 DIAGNOSIS — Q600 Renal agenesis, unilateral: Secondary | ICD-10-CM | POA: Insufficient documentation

## 2017-09-24 DIAGNOSIS — R1032 Left lower quadrant pain: Secondary | ICD-10-CM | POA: Diagnosis present

## 2017-09-24 DIAGNOSIS — G2581 Restless legs syndrome: Secondary | ICD-10-CM | POA: Diagnosis not present

## 2017-09-24 DIAGNOSIS — I10 Essential (primary) hypertension: Secondary | ICD-10-CM | POA: Diagnosis not present

## 2017-09-24 DIAGNOSIS — E785 Hyperlipidemia, unspecified: Secondary | ICD-10-CM | POA: Insufficient documentation

## 2017-09-24 DIAGNOSIS — E7800 Pure hypercholesterolemia, unspecified: Secondary | ICD-10-CM | POA: Insufficient documentation

## 2017-09-24 DIAGNOSIS — Z886 Allergy status to analgesic agent status: Secondary | ICD-10-CM | POA: Insufficient documentation

## 2017-09-24 DIAGNOSIS — N201 Calculus of ureter: Secondary | ICD-10-CM | POA: Diagnosis present

## 2017-09-24 DIAGNOSIS — G8929 Other chronic pain: Secondary | ICD-10-CM | POA: Diagnosis not present

## 2017-09-24 DIAGNOSIS — Z818 Family history of other mental and behavioral disorders: Secondary | ICD-10-CM | POA: Insufficient documentation

## 2017-09-24 DIAGNOSIS — Z833 Family history of diabetes mellitus: Secondary | ICD-10-CM | POA: Insufficient documentation

## 2017-09-24 DIAGNOSIS — M545 Low back pain: Secondary | ICD-10-CM | POA: Insufficient documentation

## 2017-09-24 DIAGNOSIS — Z7982 Long term (current) use of aspirin: Secondary | ICD-10-CM | POA: Diagnosis not present

## 2017-09-24 DIAGNOSIS — Z885 Allergy status to narcotic agent status: Secondary | ICD-10-CM | POA: Insufficient documentation

## 2017-09-24 DIAGNOSIS — Z96652 Presence of left artificial knee joint: Secondary | ICD-10-CM | POA: Insufficient documentation

## 2017-09-24 DIAGNOSIS — Z8711 Personal history of peptic ulcer disease: Secondary | ICD-10-CM | POA: Diagnosis not present

## 2017-09-24 DIAGNOSIS — Z8249 Family history of ischemic heart disease and other diseases of the circulatory system: Secondary | ICD-10-CM | POA: Diagnosis not present

## 2017-09-24 DIAGNOSIS — Z87891 Personal history of nicotine dependence: Secondary | ICD-10-CM | POA: Insufficient documentation

## 2017-09-24 DIAGNOSIS — Z79899 Other long term (current) drug therapy: Secondary | ICD-10-CM | POA: Insufficient documentation

## 2017-09-24 DIAGNOSIS — N132 Hydronephrosis with renal and ureteral calculous obstruction: Secondary | ICD-10-CM | POA: Diagnosis not present

## 2017-09-24 HISTORY — PX: CYSTOSCOPY WITH STENT PLACEMENT: SHX5790

## 2017-09-24 LAB — COMPREHENSIVE METABOLIC PANEL
ALBUMIN: 3.8 g/dL (ref 3.5–5.0)
ALT: 19 U/L (ref 17–63)
AST: 28 U/L (ref 15–41)
Alkaline Phosphatase: 93 U/L (ref 38–126)
Anion gap: 11 (ref 5–15)
BUN: 23 mg/dL — AB (ref 6–20)
CO2: 23 mmol/L (ref 22–32)
CREATININE: 3.01 mg/dL — AB (ref 0.61–1.24)
Calcium: 9.3 mg/dL (ref 8.9–10.3)
Chloride: 104 mmol/L (ref 101–111)
GFR calc Af Amer: 24 mL/min — ABNORMAL LOW (ref >60)
GFR, EST NON AFRICAN AMERICAN: 20 mL/min — AB (ref >60)
GLUCOSE: 118 mg/dL — AB (ref 65–99)
POTASSIUM: 4.9 mmol/L (ref 3.5–5.1)
Sodium: 138 mmol/L (ref 135–145)
Total Bilirubin: 0.9 mg/dL (ref 0.3–1.2)
Total Protein: 6.3 g/dL — ABNORMAL LOW (ref 6.5–8.1)

## 2017-09-24 LAB — GRAM STAIN

## 2017-09-24 LAB — CBC
HEMATOCRIT: 37.9 % — AB (ref 39.0–52.0)
Hemoglobin: 11.5 g/dL — ABNORMAL LOW (ref 13.0–17.0)
MCH: 28.3 pg (ref 26.0–34.0)
MCHC: 30.3 g/dL (ref 30.0–36.0)
MCV: 93.1 fL (ref 78.0–100.0)
Platelets: 213 10*3/uL (ref 150–400)
RBC: 4.07 MIL/uL — ABNORMAL LOW (ref 4.22–5.81)
RDW: 14.2 % (ref 11.5–15.5)
WBC: 9.8 10*3/uL (ref 4.0–10.5)

## 2017-09-24 LAB — LIPASE, BLOOD: Lipase: 35 U/L (ref 11–51)

## 2017-09-24 LAB — I-STAT CG4 LACTIC ACID, ED: Lactic Acid, Venous: 1.79 mmol/L (ref 0.5–1.9)

## 2017-09-24 SURGERY — CYSTOSCOPY, WITH STENT INSERTION
Anesthesia: General | Laterality: Left

## 2017-09-24 MED ORDER — MEPERIDINE HCL 25 MG/ML IJ SOLN: 6.2500 mg | INTRAMUSCULAR | Status: DC | PRN

## 2017-09-24 MED ORDER — FENTANYL CITRATE (PF) 100 MCG/2ML IJ SOLN
INTRAMUSCULAR | Status: DC | PRN
  Administered 2017-09-24 (×2): 50 ug via INTRAVENOUS

## 2017-09-24 MED ORDER — FENTANYL CITRATE (PF) 100 MCG/2ML IJ SOLN
25.0000 ug | INTRAMUSCULAR | Status: DC | PRN
  Administered 2017-09-24 (×3): 50 ug via INTRAVENOUS

## 2017-09-24 MED ORDER — CARVEDILOL 3.125 MG PO TABS
3.1250 mg | ORAL_TABLET | Freq: Every day | ORAL | Status: DC
  Administered 2017-09-24 – 2017-09-25 (×2): 3.125 mg via ORAL
  Filled 2017-09-24 (×2): qty 1

## 2017-09-24 MED ORDER — CLONAZEPAM 0.5 MG PO TABS: 0.5000 mg | ORAL_TABLET | Freq: Three times a day (TID) | ORAL | Status: DC | PRN

## 2017-09-24 MED ORDER — MIDAZOLAM HCL 2 MG/2ML IJ SOLN
INTRAMUSCULAR | Status: AC
  Filled 2017-09-24: qty 2

## 2017-09-24 MED ORDER — OXYCODONE-ACETAMINOPHEN 5-325 MG PO TABS
1.0000 | ORAL_TABLET | ORAL | Status: DC | PRN
  Administered 2017-09-25 (×2): 2 via ORAL
  Filled 2017-09-24 (×2): qty 1
  Filled 2017-09-24 (×2): qty 2

## 2017-09-24 MED ORDER — IOPAMIDOL (ISOVUE-300) INJECTION 61%
INTRAVENOUS | Status: AC
  Filled 2017-09-24: qty 50

## 2017-09-24 MED ORDER — SUCCINYLCHOLINE CHLORIDE 20 MG/ML IJ SOLN
INTRAMUSCULAR | Status: DC | PRN
  Administered 2017-09-24: 100 mg via INTRAVENOUS

## 2017-09-24 MED ORDER — FENTANYL CITRATE (PF) 250 MCG/5ML IJ SOLN
INTRAMUSCULAR | Status: AC
  Filled 2017-09-24: qty 5

## 2017-09-24 MED ORDER — STERILE WATER FOR IRRIGATION IR SOLN
Status: DC | PRN
  Administered 2017-09-24: 3000 mL

## 2017-09-24 MED ORDER — FENTANYL CITRATE (PF) 100 MCG/2ML IJ SOLN
INTRAMUSCULAR | Status: AC
  Filled 2017-09-24: qty 2

## 2017-09-24 MED ORDER — HYDROMORPHONE HCL 1 MG/ML IJ SOLN
0.5000 mg | Freq: Once | INTRAMUSCULAR | Status: AC
  Administered 2017-09-24: 0.5 mg via INTRAVENOUS
  Filled 2017-09-24: qty 1

## 2017-09-24 MED ORDER — SODIUM CHLORIDE 0.9 % IV SOLN
INTRAVENOUS | Status: DC | PRN
  Administered 2017-09-24: 11:00:00 via INTRAVENOUS

## 2017-09-24 MED ORDER — DEXTROSE 5 % IV SOLN
1.0000 g | INTRAVENOUS | Status: DC
  Administered 2017-09-24 – 2017-09-25 (×2): 1 g via INTRAVENOUS
  Filled 2017-09-24 (×2): qty 10

## 2017-09-24 MED ORDER — ONDANSETRON HCL 4 MG/2ML IJ SOLN
INTRAMUSCULAR | Status: DC | PRN
  Administered 2017-09-24: 4 mg via INTRAVENOUS

## 2017-09-24 MED ORDER — DEXAMETHASONE SODIUM PHOSPHATE 4 MG/ML IJ SOLN
INTRAMUSCULAR | Status: DC | PRN
  Administered 2017-09-24: 10 mg via INTRAVENOUS

## 2017-09-24 MED ORDER — IOPAMIDOL (ISOVUE-300) INJECTION 61%
INTRAVENOUS | Status: DC | PRN
  Administered 2017-09-24: 30 mL

## 2017-09-24 MED ORDER — ROPINIROLE HCL 1 MG PO TABS: 1.0000 mg | ORAL_TABLET | Freq: Every evening | ORAL | Status: DC | PRN

## 2017-09-24 MED ORDER — PANTOPRAZOLE SODIUM 40 MG PO TBEC
40.0000 mg | DELAYED_RELEASE_TABLET | Freq: Every day | ORAL | Status: DC
  Administered 2017-09-24 – 2017-09-25 (×2): 40 mg via ORAL
  Filled 2017-09-24 (×2): qty 1

## 2017-09-24 MED ORDER — PROMETHAZINE HCL 25 MG/ML IJ SOLN
25.0000 mg | Freq: Four times a day (QID) | INTRAMUSCULAR | Status: DC | PRN
  Administered 2017-09-24 (×2): 12.5 mg via INTRAVENOUS
  Administered 2017-09-25: 25 mg via INTRAVENOUS
  Filled 2017-09-24 (×3): qty 1

## 2017-09-24 MED ORDER — CYCLOBENZAPRINE HCL 10 MG PO TABS: 10.0000 mg | ORAL_TABLET | Freq: Three times a day (TID) | ORAL | Status: DC | PRN

## 2017-09-24 MED ORDER — ROPINIROLE HCL 1 MG PO TABS: 1.0000 mg | ORAL_TABLET | ORAL | Status: DC

## 2017-09-24 MED ORDER — PROPOFOL 10 MG/ML IV BOLUS
INTRAVENOUS | Status: AC
Start: 2017-09-24 — End: ?
  Filled 2017-09-24: qty 20

## 2017-09-24 MED ORDER — ONDANSETRON HCL 4 MG/2ML IJ SOLN
4.0000 mg | Freq: Once | INTRAMUSCULAR | Status: AC
  Administered 2017-09-24: 4 mg via INTRAVENOUS
  Filled 2017-09-24: qty 2

## 2017-09-24 MED ORDER — ROPINIROLE HCL 1 MG PO TABS
1.0000 mg | ORAL_TABLET | Freq: Every day | ORAL | Status: DC
  Administered 2017-09-24: 1 mg via ORAL
  Filled 2017-09-24: qty 1

## 2017-09-24 MED ORDER — SIMVASTATIN 40 MG PO TABS
40.0000 mg | ORAL_TABLET | Freq: Every day | ORAL | Status: DC
  Administered 2017-09-24 – 2017-09-25 (×2): 40 mg via ORAL
  Filled 2017-09-24 (×2): qty 1

## 2017-09-24 MED ORDER — ASPIRIN EC 81 MG PO TBEC
81.0000 mg | DELAYED_RELEASE_TABLET | Freq: Every day | ORAL | Status: DC
  Administered 2017-09-24 – 2017-09-25 (×2): 81 mg via ORAL
  Filled 2017-09-24 (×2): qty 1

## 2017-09-24 MED ORDER — HYDROMORPHONE HCL 1 MG/ML IJ SOLN
0.5000 mg | INTRAMUSCULAR | Status: DC | PRN
  Administered 2017-09-24: 1 mg via INTRAVENOUS
  Administered 2017-09-24: 0.5 mg via INTRAVENOUS
  Administered 2017-09-25: 1 mg via INTRAVENOUS
  Filled 2017-09-24 (×3): qty 1

## 2017-09-24 MED ORDER — PROMETHAZINE HCL 25 MG/ML IJ SOLN: 6.2500 mg | INTRAMUSCULAR | Status: DC | PRN

## 2017-09-24 MED ORDER — SODIUM CHLORIDE 0.9 % IV SOLN
INTRAVENOUS | Status: DC
  Administered 2017-09-24 – 2017-09-25 (×2): via INTRAVENOUS

## 2017-09-24 MED ORDER — MIDAZOLAM HCL 2 MG/2ML IJ SOLN: 0.5000 mg | Freq: Once | INTRAMUSCULAR | Status: DC | PRN

## 2017-09-24 MED ORDER — SENNOSIDES-DOCUSATE SODIUM 8.6-50 MG PO TABS
1.0000 | ORAL_TABLET | Freq: Two times a day (BID) | ORAL | Status: DC
  Administered 2017-09-24 – 2017-09-25 (×3): 1 via ORAL
  Filled 2017-09-24 (×3): qty 1

## 2017-09-24 MED ORDER — PROPOFOL 10 MG/ML IV BOLUS
INTRAVENOUS | Status: DC | PRN
  Administered 2017-09-24: 150 mg via INTRAVENOUS

## 2017-09-24 MED ORDER — LIDOCAINE HCL 2 % EX GEL
CUTANEOUS | Status: AC
  Filled 2017-09-24: qty 20

## 2017-09-24 MED ORDER — HYDROMORPHONE HCL 1 MG/ML IJ SOLN: 1.0000 mg | Freq: Once | INTRAMUSCULAR | Status: DC

## 2017-09-24 MED ORDER — MIDAZOLAM HCL 5 MG/5ML IJ SOLN
INTRAMUSCULAR | Status: DC | PRN
  Administered 2017-09-24: 2 mg via INTRAVENOUS

## 2017-09-24 MED ORDER — HEPARIN SODIUM (PORCINE) 5000 UNIT/ML IJ SOLN
5000.0000 [IU] | Freq: Three times a day (TID) | INTRAMUSCULAR | Status: DC
  Administered 2017-09-25 (×2): 5000 [IU] via SUBCUTANEOUS
  Filled 2017-09-24 (×4): qty 1

## 2017-09-24 SURGICAL SUPPLY — 16 items
BAG URINE DRAINAGE (UROLOGICAL SUPPLIES) ×1 IMPLANT
BAG URO CATCHER STRL LF (MISCELLANEOUS) ×2 IMPLANT
CATH FOLEY 2WAY  5CC 16FR SIL (CATHETERS) ×1
CATH FOLEY 2WAY 5CC 16FR SIL (CATHETERS) IMPLANT
CATH INTERMIT  6FR 70CM (CATHETERS) IMPLANT
GLOVE BIO SURGEON STRL SZ7.5 (GLOVE) ×2 IMPLANT
GOWN SRG XL LVL 4 BRTHBL STRL (GOWNS) ×1 IMPLANT
GOWN STRL NON-REIN LRG LVL3 (GOWN DISPOSABLE) ×2 IMPLANT
GOWN STRL NON-REIN XL LVL4 (GOWNS) ×2
GUIDEWIRE ANG ZIPWIRE 038X150 (WIRE) ×3 IMPLANT
GUIDEWIRE STR DUAL SENSOR (WIRE) IMPLANT
IV NS IRRIG 3000ML ARTHROMATIC (IV SOLUTION) ×2 IMPLANT
PACK CYSTO (CUSTOM PROCEDURE TRAY) ×2 IMPLANT
SYR 10ML LL (SYRINGE) IMPLANT
TOWEL OR 17X24 6PK STRL BLUE (TOWEL DISPOSABLE) ×2 IMPLANT
TUBE FEEDING 8FR 16IN STR KANG (MISCELLANEOUS) IMPLANT

## 2017-09-24 NOTE — Progress Notes (Signed)
Wedding band given to wife at bedside 

## 2017-09-24 NOTE — Anesthesia Postprocedure Evaluation (Signed)
Anesthesia Post Note  Patient: Bradley Morgan  Procedure(s) Performed: RENAL CYSTOSCOPY LEFT RETROGRADE, LEFT URETERAL STENT PLACEMENT (Left )     Patient location during evaluation: PACU Anesthesia Type: General Level of consciousness: awake and alert, oriented and patient cooperative Pain management: pain level controlled Vital Signs Assessment: post-procedure vital signs reviewed and stable Respiratory status: spontaneous breathing, nonlabored ventilation, respiratory function stable and patient connected to nasal cannula oxygen Cardiovascular status: blood pressure returned to baseline and stable Postop Assessment: no apparent nausea or vomiting Anesthetic complications: no    Last Vitals:  Vitals:   09/24/17 1200 09/24/17 1215  BP:  (!) 148/90  Pulse:  79  Resp:  20  Temp: (!) 36.4 C   SpO2:  96%    Last Pain:  Vitals:   09/24/17 1215  TempSrc:   PainSc: 6                  Shaton Lore,E. Rolin Schult

## 2017-09-24 NOTE — ED Provider Notes (Signed)
[facility] EMERGENCY DEPARTMENT Provider Note   CSN: [id number] Arrival date & time: 09/24/17  0341     History   Chief Complaint Chief Complaint  Patient presents with  . Abdominal Pain    HPI Bradley Morgan is a 67 y.o. male with history of arthritis, chronic pain, GERD, HLD, HTN, gastric ulcers, and acute encephalopathy presents today with chief complaint acute onset, constant left lower quadrant abdominal pain for 2 days.  Pain is sharp, worsens with movement and is unrelieved by anything.  He endorses nausea but no vomiting.  He endorses subjective fevers and chills at home.  He also notes 2 days of difficulty urinating as well as dysuria.  He called his primary care physician and was called in a prescription for Cipro as he had similar symptoms recently and was treated with the same.  He states his symptoms have improved somewhat with Cipro, but is still having difficulty urinating.  Denies melena, hematochezia, hematuria, chest pain, shortness of breath.  No known sick contacts.  Last oral intake was yesterday morning.  He is not currently on any blood thinners.  The history is provided by the patient.    Past Medical History:  Diagnosis Date  . Anxiety   . Arthritis   . Chronic pain    neck;lower back  . Complication of anesthesia    ANESTHESIA DID NOT TAKE EFFECT WITH BACK SURGERY "I FELT THEM CUT"  . Dog bite    dog bite to nose; right side of nose, 1 3/4- inch superficial redenned are, no visible drainage ;   . Family history of anesthesia complication    "hard time waking up my dad" many yrs ago  . GERD (gastroesophageal reflux disease)   . History of transfusion   . Hyperlipemia   . Hypertension   . Injury    pt reports pain to right shoulder; states "i think it came apart when i stretched by my arm when waking up this morning"   . Multiple gastric ulcers    ruptured gastric ulcer 2001  . PONV (postoperative nausea and vomiting)   . Restless  leg syndrome   . Rotator cuff tear    left    Patient Active Problem List   Diagnosis Date Noted  . 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 Complete rotator cuff tear 07/11/2014  . Acute encephalopathy 04/28/2014  . Numbness and tingling of left side of face 04/27/2014  . Weakness of right hand 04/27/2014  . Drug overdose 04/27/2014  . Obesity, unspecified 12/17/2013  . Essential hypertension, benign 12/17/2013  . Pure hypercholesterolemia 12/17/2013  . Chest pain 12/16/2013    Past Surgical History:  Procedure Laterality Date  . BACK SURGERY     5 back  . HIP SURGERY     LEFT - TORN TENDON  . INCISION AND DRAINAGE ABSCESS Right 03/06/2015   Procedure: INCISION AND DRAINAGE ABSCESS RIGHT ARM;  Surgeon: Daryll Brod, MD;  Location: [facility];  Service: Orthopedics;  Laterality: Right;  . JOINT REPLACEMENT    . NECK SURGERY  2012   2  total RODS / PLATES IN NECK  . ROTATOR CUFF REPAIR Bilateral   . SHOULDER OPEN ROTATOR CUFF REPAIR Left 07/11/2014   Procedure: ROTATOR CUFF REPAIR SHOULDER OPEN WITH  GRAFT ;  Surgeon: Tobi Bastos, MD;  Location: [facility];  Service: Orthopedics;  Laterality: Left;  . TOTAL KNEE ARTHROPLASTY Left   . TOTAL KNEE  REVISION Left 08/03/2017   Procedure: Left knee tibia polyethylene and patella revision;  Surgeon: Gaynelle Arabian, MD;  Location: [facility];  Service: Orthopedics;  Laterality: Left;  with abductor block       Home Medications    Prior to Admission medications   Medication Sig Start Date End Date Taking? Authorizing Provider  aspirin EC 81 MG tablet Take 81 mg by mouth daily.   Yes [provider]  carvedilol (COREG) 3.125 MG tablet Take 3.125 mg by mouth daily.    Yes [provider]  ciprofloxacin (CIPRO) 500 MG tablet Take 500 mg by mouth 2 (two) times daily.   Yes [provider]  clonazePAM (KLONOPIN) 0.5 MG tablet Take 0.5 mg 3 (three) times daily as needed by mouth for  anxiety.    Yes [provider]  cyclobenzaprine (FLEXERIL) 10 MG tablet Take 1 tablet (10 mg total) 3 (three) times daily as needed by mouth for muscle spasms. 08/04/17  Yes [name], [name], PA-C  gabapentin (NEURONTIN) 300 MG capsule Take 300-600 mg See admin instructions by mouth. Take 300 mg by mouth in the morning, take 300 mg by mouth in the afternoon, take 300 mg by mouth at supper and take 600 mg by mouth at bedtime   Yes [provider]  HYDROcodone-acetaminophen (NORCO/VICODIN) 5-325 MG tablet Take 1-2 tablets by mouth every 6 (six) hours as needed for moderate pain.   Yes [provider]  lisinopril (PRINIVIL,ZESTRIL) 10 MG tablet Take 10 mg by mouth at bedtime.    Yes [provider]  Loratadine-Pseudoephedrine (CLARITIN-D 12 HOUR PO) Take 1 tablet daily as needed by mouth (for allergies).    Yes [provider]  oxyCODONE (OXY IR/ROXICODONE) 5 MG immediate release tablet Take 1-2 tablets (5-10 mg total) every 4 (four) hours as needed by mouth for moderate pain or severe pain. 08/04/17  Yes [name], [name], PA-C  pantoprazole (PROTONIX) 40 MG tablet Take 40 mg daily by mouth.    Yes [provider]  rOPINIRole (REQUIP) 1 MG tablet Take 1 mg See admin instructions by mouth. Take 1 mg by mouth at bedtime. May take additional 1 mg if needed for restless leg   Yes [provider]  simvastatin (ZOCOR) 40 MG tablet Take 40 mg daily by mouth.    Yes [provider]  traMADol (ULTRAM) 50 MG tablet Take 1-2 tablets (50-100 mg total) every 6 (six) hours as needed by mouth for moderate pain. 08/04/17  Yes [name], [name], PA-C    Family History Family History  Problem Relation Age of Onset  . Heart failure Father   . Diabetes Father   . Hypertension Father   . Anxiety disorder Mother        also tended to overdose on anxiety medication    Social History Social History   Tobacco Use  . Smoking  status: Former Smoker    Types: Cigarettes    Last attempt to quit: 10/09/1998    Years since quitting: 18.9  . Smokeless tobacco: Never Used  Substance Use Topics  . Alcohol use: No  . Drug use: No     Allergies   Morphine and related and Aspirin   Review of Systems Review of Systems  Constitutional: Positive for chills and fever.  Respiratory: Negative for shortness of breath.   Cardiovascular: Negative for chest pain.  Gastrointestinal: Positive for abdominal pain and nausea. Negative for constipation, diarrhea and vomiting.  Genitourinary: Positive for difficulty  urinating and urgency. Negative for hematuria.  All other systems reviewed and are negative.    Physical Exam Updated Vital Signs BP (!) 167/100   Pulse 95   Temp 99.7 F (37.6 C) (Rectal)   Resp 16   Ht 6' (1.829 m)   Wt 117.9 kg (260 lb)   SpO2 94%   BMI 35.26 kg/m   Physical Exam  Constitutional: He appears well-developed and well-nourished. No distress.  HENT:  Head: Normocephalic and atraumatic.  Eyes: Conjunctivae are normal. Right eye exhibits no discharge. Left eye exhibits no discharge.  Neck: No JVD present. No tracheal deviation present.  Cardiovascular: Normal rate, regular rhythm and normal heart sounds.  Pulmonary/Chest: Effort normal and breath sounds normal.  Abdominal: Soft. Bowel sounds are normal. He exhibits no distension. There is tenderness in the left upper quadrant and left lower quadrant. There is guarding. There is no rigidity, no rebound, no CVA tenderness, no tenderness at McBurney's point and negative Murphy's sign.  Musculoskeletal: He exhibits no edema.  Neurological: He is alert.  Skin: Skin is warm and dry. No erythema.  Contact dermatitis noted to the abdomen overlying the suprapubic region.  Patient's wife states this is chronic and unchanged and secondary to his belt buckle.  Psychiatric: He has a normal mood and affect. His behavior is normal.  Nursing note and  vitals reviewed.    ED Treatments / Results  Labs (all labs ordered are listed, but only abnormal results are displayed) Labs Reviewed  COMPREHENSIVE METABOLIC PANEL - Abnormal; Notable for the following components:      Result Value   Glucose, Bld 118 (*)    BUN 23 (*)    Creatinine, Ser 3.01 (*)    Total Protein 6.3 (*)    GFR calc non Af Amer 20 (*)    GFR calc Af Amer 24 (*)    All other components within normal limits  CBC - Abnormal; Notable for the following components:   RBC 4.07 (*)    Hemoglobin 11.5 (*)    HCT 37.9 (*)    All other components within normal limits  LIPASE, BLOOD  URINALYSIS, ROUTINE W REFLEX MICROSCOPIC  I-STAT CG4 LACTIC ACID, ED  I-STAT CG4 LACTIC ACID, ED    EKG  EKG Interpretation None       Radiology Ct Abdomen Pelvis Wo Contrast  Result Date: 09/24/2017 CLINICAL DATA:  Left-sided abdominal pain.  Nausea. EXAM: CT ABDOMEN AND PELVIS WITHOUT CONTRAST TECHNIQUE: Multidetector CT imaging of the abdomen and pelvis was performed following the standard protocol without IV contrast. COMPARISON:  CT abdomen pelvis 10/05/2014. FINDINGS: Lower chest: Normal heart size. Minimal atelectasis/ scarring left lower lobe. No pleural effusion. Hepatobiliary: Liver is normal in size and contour. Gallbladder is unremarkable. No intrahepatic or extrahepatic biliary ductal dilatation. Pancreas: Unremarkable Spleen: Unremarkable Adrenals/Urinary Tract: Unchanged 4 cm left adrenal nodule, most compatible with adenoma. Right adrenal gland is unremarkable. The right kidney is absent. There is a 4 mm stone within the proximal left ureter (image 47; series 3) resulting in moderate left hydroureteronephrosis. There is a significant amount of fluid within the perinephric space suggestive of possible calyceal rupture. Possible punctate 1-2 mm stone inferior pole left kidney (image 47; series 6). Stomach/Bowel: Descending and sigmoid colonic diverticulosis. No CT evidence for  acute diverticulitis. Normal appendix. Normal morphology of the stomach. Vascular/Lymphatic: Normal caliber abdominal aorta. Retroaortic left renal vein. Atherosclerotic plaque involving the abdominal aorta. No retroperitoneal lymphadenopathy. Reproductive: Central dystrophic calcifications in the  prostate. Other: Small bilateral fat containing inguinal hernias. Musculoskeletal: Lumbar spine degenerative changes. No aggressive or acute appearing osseous lesions. IMPRESSION: 1. There is a 4 mm obstructing stone within the proximal left ureter resulting in mild to moderate left hydroureteronephrosis. There is extensive fluid about the left kidney and ureter suggestive of calyceal rupture. 2. Colonic diverticulosis without evidence for acute diverticulitis. Electronically Signed   By: Lovey Newcomer M.D.   On: 09/24/2017 08:01    Procedures Procedures (including critical care time)  Medications Ordered in ED Medications  cefTRIAXone (ROCEPHIN) 1 g in dextrose 5 % 50 mL IVPB (not administered)  ondansetron (ZOFRAN) injection 4 mg (4 mg Intravenous Given 09/24/17 0713)  HYDROmorphone (DILAUDID) injection 0.5 mg (0.5 mg Intravenous Given 09/24/17 0713)  ondansetron (ZOFRAN) injection 4 mg (4 mg Intravenous Given 09/24/17 0959)  HYDROmorphone (DILAUDID) injection 0.5 mg (0.5 mg Intravenous Given 09/24/17 1003)     Initial Impression / Assessment and Plan / ED Course  I have reviewed the triage vital signs and the nursing notes.  Pertinent labs & imaging results that were available during my care of the patient were reviewed by me and considered in my medical decision making (see chart for details).     Patient with left lower quadrant pain for 2 days as well as difficulty urinating.  Afebrile, hypertensive while in the ED.  Unable to obtain urine from the patient, and bladder scan shows 47 mL of urine in the bladder.  No leukocytosis.  CMP shows acute increase in creatinine from 1 month ago to 3.01.  CT scan of  the abdomen shows congenitally absent right kidney and 4 mm obstructing left kidney stone.  This is also resulting in moderate left hydroureteronephrosis and evidence suggestive of calyceal rupture.  No evidence of diverticulitis.  Spoke with Dr. Tresa Moore with urology who agrees to assume care of patient and bring him into the hospital.  Plan is to keep patient n.p.o. and undergo stent.  He will also continue to observe him in the hospital.  Patient seen and evaluated by Dr. Betsey Holiday who agrees with assessment and plan at this time.  Final Clinical Impressions(s) / ED Diagnoses   Final diagnoses:  Ureterolithiasis  Hydroureteronephrosis  Left lower quadrant pain    ED Discharge Orders    None       Renita Papa, PA-C 09/24/17 [place], PA-C 09/24/17 1034    Orpah Greek, MD 09/25/17 [phone]

## 2017-09-24 NOTE — ED Notes (Signed)
Per Dorothea Ogle, EMT 47 mL obtained from bladder scan.

## 2017-09-24 NOTE — H&P (Signed)
Bradley Morgan is an 67 y.o. male.    Chief Complaint: Left Ureteral Stone in Solitary Kidney  HPI:   1 - Left Ureteral Stone in Solitary Kidney with Acute Renal Failure - 4m left proximal ureteral stone with mod hydro in congenitally solitary kidney by ER CT 09/24/17 on eval left flank pain and nusea. Cr 3.0 up from baseline <1 and presently anuric,   2 - Fevers, Suspect Pyelonephritis - Fevers to 100, tachycardic, significant left perinephric stranding concerning for pyelo / early urosepsis. No UA yet as anuric. Placed on imperic rocephin.   3 - Prostate Screening - Pt's father with prostate cancer. No PSA data for review.   PMH sig for knee replacement, neck / back surgery with some LE instability / neuropathy. No ischemic CV disease / blood thinners.   Today " Bradley Morgan" is seen for emergent evaluation of above.   Past Medical History:  Diagnosis Date  . Anxiety   . Arthritis   . Chronic pain    neck;lower back  . Complication of anesthesia    ANESTHESIA DID NOT TAKE EFFECT WITH BACK SURGERY "I FELT THEM CUT"  . Dog bite    dog bite to nose; right side of nose, 1 3/4- inch superficial redenned are, no visible drainage ;   . Family history of anesthesia complication    "hard time waking up my dad" many yrs ago  . GERD (gastroesophageal reflux disease)   . History of transfusion   . Hyperlipemia   . Hypertension   . Injury    pt reports pain to right shoulder; states "i think it came apart when i stretched by my arm when waking up this morning"   . Multiple gastric ulcers    ruptured gastric ulcer 2001  . PONV (postoperative nausea and vomiting)   . Restless leg syndrome   . Rotator cuff tear    left    Past Surgical History:  Procedure Laterality Date  . BACK SURGERY     5 back  . HIP SURGERY     LEFT - TORN TENDON  . INCISION AND DRAINAGE ABSCESS Right 03/06/2015   Procedure: INCISION AND DRAINAGE ABSCESS RIGHT ARM;  Surgeon: GDaryll Brod MD;  Location: [facility]  Service: Orthopedics;  Laterality: Right;  . JOINT REPLACEMENT    . NECK SURGERY  2012   2  total RODS / PLATES IN NECK  . ROTATOR CUFF REPAIR Bilateral   . SHOULDER OPEN ROTATOR CUFF REPAIR Left 07/11/2014   Procedure: ROTATOR CUFF REPAIR SHOULDER OPEN WITH  GRAFT ;  Surgeon: RTobi Bastos MD;  Location: [facility];  Service: Orthopedics;  Laterality: Left;  . TOTAL KNEE ARTHROPLASTY Left   . TOTAL KNEE REVISION Left 08/03/2017   Procedure: Left knee tibia polyethylene and patella revision;  Surgeon: AGaynelle Arabian MD;  Location: [facility];  Service: Orthopedics;  Laterality: Left;  with abductor block    Family History  Problem Relation Age of Onset  . Heart failure Father   . Diabetes Father   . Hypertension Father   . Anxiety disorder Mother        also tended to overdose on anxiety medication   Social History:  reports that he quit smoking about 18 years ago. His smoking use included cigarettes. he has never used smokeless tobacco. He reports that he does not drink alcohol or use drugs.  Allergies:  Allergies  Allergen Reactions  . Morphine And Related Shortness  Of Breath  . Aspirin Other (See Comments)    No high doses     (Not in a hospital admission)  Results for orders placed or performed during the hospital encounter of 09/24/17 (from the past 48 hour(s))  Lipase, blood     Status: None   Collection Time: 09/24/17  4:03 AM  Result Value Ref Range   Lipase 35 11 - 51 U/L  Comprehensive metabolic panel     Status: Abnormal   Collection Time: 09/24/17  4:03 AM  Result Value Ref Range   Sodium 138 135 - 145 mmol/L   Potassium 4.9 3.5 - 5.1 mmol/L   Chloride 104 101 - 111 mmol/L   CO2 23 22 - 32 mmol/L   Glucose, Bld 118 (H) 65 - 99 mg/dL   BUN 23 (H) 6 - 20 mg/dL   Creatinine, Ser 3.01 (H) 0.61 - 1.24 mg/dL   Calcium 9.3 8.9 - 10.3 mg/dL   Total Protein 6.3 (L) 6.5 - 8.1 g/dL   Albumin 3.8 3.5 - 5.0 g/dL   AST 28 15 - 41 U/L   ALT 19 17 - 63  U/L   Alkaline Phosphatase 93 38 - 126 U/L   Total Bilirubin 0.9 0.3 - 1.2 mg/dL   GFR calc non Af Amer 20 (L) >60 mL/min   GFR calc Af Amer 24 (L) >60 mL/min    Comment: (NOTE) The eGFR has been calculated using the CKD EPI equation. This calculation has not been validated in all clinical situations. eGFR's persistently <60 mL/min signify possible Chronic Kidney Disease.    Anion gap 11 5 - 15  CBC     Status: Abnormal   Collection Time: 09/24/17  4:03 AM  Result Value Ref Range   WBC 9.8 4.0 - 10.5 K/uL   RBC 4.07 (L) 4.22 - 5.81 MIL/uL   Hemoglobin 11.5 (L) 13.0 - 17.0 g/dL   HCT 37.9 (L) 39.0 - 52.0 %   MCV 93.1 78.0 - 100.0 fL   MCH 28.3 26.0 - 34.0 pg   MCHC 30.3 30.0 - 36.0 g/dL   RDW 14.2 11.5 - 15.5 %   Platelets 213 150 - 400 K/uL  I-Stat CG4 Lactic Acid, ED     Status: None   Collection Time: 09/24/17  7:00 AM  Result Value Ref Range   Lactic Acid, Venous 1.79 0.5 - 1.9 mmol/L   Ct Abdomen Pelvis Wo Contrast  Result Date: 09/24/2017 CLINICAL DATA:  Left-sided abdominal pain.  Nausea. EXAM: CT ABDOMEN AND PELVIS WITHOUT CONTRAST TECHNIQUE: Multidetector CT imaging of the abdomen and pelvis was performed following the standard protocol without IV contrast. COMPARISON:  CT abdomen pelvis 10/05/2014. FINDINGS: Lower chest: Normal heart size. Minimal atelectasis/ scarring left lower lobe. No pleural effusion. Hepatobiliary: Liver is normal in size and contour. Gallbladder is unremarkable. No intrahepatic or extrahepatic biliary ductal dilatation. Pancreas: Unremarkable Spleen: Unremarkable Adrenals/Urinary Tract: Unchanged 4 cm left adrenal nodule, most compatible with adenoma. Right adrenal gland is unremarkable. The right kidney is absent. There is a 4 mm stone within the proximal left ureter (image 47; series 3) resulting in moderate left hydroureteronephrosis. There is a significant amount of fluid within the perinephric space suggestive of possible calyceal rupture. Possible  punctate 1-2 mm stone inferior pole left kidney (image 47; series 6). Stomach/Bowel: Descending and sigmoid colonic diverticulosis. No CT evidence for acute diverticulitis. Normal appendix. Normal morphology of the stomach. Vascular/Lymphatic: Normal caliber abdominal aorta. Retroaortic left renal vein. Atherosclerotic plaque  involving the abdominal aorta. No retroperitoneal lymphadenopathy. Reproductive: Central dystrophic calcifications in the prostate. Other: Small bilateral fat containing inguinal hernias. Musculoskeletal: Lumbar spine degenerative changes. No aggressive or acute appearing osseous lesions. IMPRESSION: 1. There is a 4 mm obstructing stone within the proximal left ureter resulting in mild to moderate left hydroureteronephrosis. There is extensive fluid about the left kidney and ureter suggestive of calyceal rupture. 2. Colonic diverticulosis without evidence for acute diverticulitis. Electronically Signed   By: Lovey Newcomer M.D.   On: 09/24/2017 08:01    Review of Systems  Constitutional: Positive for chills and fever.  HENT: Negative.   Eyes: Negative.   Respiratory: Negative.   Cardiovascular: Negative.   Gastrointestinal: Negative.   Genitourinary: Positive for flank pain.  Skin: Negative.   Neurological: Negative.   Endo/Heme/Allergies: Negative.   Psychiatric/Behavioral: Negative.     Blood pressure (!) 150/87, pulse 96, temperature 99.7 F (37.6 C), temperature source Rectal, resp. rate 16, height 6' (1.829 m), weight 117.9 kg (260 lb), SpO2 96 %. Physical Exam  Constitutional: He appears well-developed.  Family at bedside  HENT:  Head: Normocephalic.  Eyes: Pupils are equal, round, and reactive to light.  Neck: Normal range of motion.  Cardiovascular:  Regular tachycardia  Respiratory: Effort normal.  GI: Soft.  Genitourinary:  Genitourinary Comments: Significant left CVAT  Musculoskeletal: Normal range of motion.  Mild LE edema to knees.   Neurological: He  is alert.  Skin: Skin is warm.  Psychiatric: He has a normal mood and affect.     Assessment/Plan  1 - Left Ureteral Stone in Solitary Kidney with Acute Renal Failure - Rec emergent cysto, left retrograde, left stent placement for renal decompression, then ureteroscopyin elective setting in few weeks after renal failure and infectious paramaters resolved. Alternative would be neph tube, but more invasive.  Admit post-op to verify trend of GFR recovery.  Risks, benefits, alternatives, expected peri-op course discussed.   2 - Fevers, Suspect Pyelonephritis - Rocephin 1gm Q24, UCX will be obtained in OR. Will need to remain in house until afebrile x 24 hrs post-op.  3 - Prostate Screening - will need screening in elective setting given FHX.   Alexis Frock, MD 09/24/2017, 9:19 AM

## 2017-09-24 NOTE — Anesthesia Preprocedure Evaluation (Addendum)
Anesthesia Evaluation  Patient identified by MRN, date of birth, ID band Patient awake    Reviewed: Allergy & Precautions, NPO status , Patient's Chart, lab work & pertinent test results  History of Anesthesia Complications (+) PONV  Airway Mallampati: I  TM Distance: >3 FB Neck ROM: Full    Dental  (+) Dental Advisory Given   Pulmonary former smoker (quit 2000),    breath sounds clear to auscultation       Cardiovascular hypertension, Pt. on medications and Pt. on home beta blockers (-) angina Rhythm:Regular Rate:Normal  '15 Stress: Fixed defect in the inferoseptal region but there is no stress-induced ischemia, EF 63%   Neuro/Psych Anxiety negative neurological ROS     GI/Hepatic Neg liver ROS, PUD, GERD  Controlled,Presently nauseated   Endo/Other  Morbid obesity  Renal/GU Renal InsufficiencyRenal disease: creat 3.01, K+ 4.9.stones     Musculoskeletal   Abdominal (+) + obese,   Peds  Hematology negative hematology ROS (+)   Anesthesia Other Findings   Reproductive/Obstetrics                            Anesthesia Physical Anesthesia Plan  ASA: II  Anesthesia Plan: General   Post-op Pain Management:    Induction: Intravenous and Rapid sequence  PONV Risk Score and Plan: 3 and Ondansetron, Dexamethasone and Scopolamine patch - Pre-op  Airway Management Planned: Oral ETT  Additional Equipment:   Intra-op Plan:   Post-operative Plan: Extubation in OR  Informed Consent: I have reviewed the patients History and Physical, chart, labs and discussed the procedure including the risks, benefits and alternatives for the proposed anesthesia with the patient or authorized representative who has indicated his/her understanding and acceptance.   Dental advisory given  Plan Discussed with: CRNA and Surgeon  Anesthesia Plan Comments: (Plan routine monitors, GETA)        Anesthesia  Quick Evaluation

## 2017-09-24 NOTE — Op Note (Signed)
NAME:  Bradley Morgan, Bradley Morgan NO.:  [id number]  MEDICAL RECORD NO.:  [id number]  LOCATION:  [facility]                         FACILITY:  [facility]  PHYSICIAN:  Alexis Frock, MD     DATE OF BIRTH:  22-Apr-1951  DATE OF PROCEDURE: 09/24/2017                              OPERATIVE REPORT   PREOPERATIVE DIAGNOSES: 1. Left ureteral stone. 2. Solitary kidney acute renal failure with fevers.  PROCEDURE: 1. Cystoscopy. 2. Left retrograde pyelogram interpretation. 3. Left ureteral stent placement.  ESTIMATED BLOOD LOSS:  Nil.  COMPLICATION:  None.  SPECIMEN:  Left renal pelvis urine for Gram-stain and culture.  FINDINGS: 1. Mild hydroureteronephrosis. 2. Mobile filling defect in the proximal ureter consistent with known     stone. 3. Successful placement of left ureteral stent, proximally in the     renal pelvis and distally in the bladder.  INDICATION:  [name] is a pleasant 67 year old gentleman with history of congenital solitary kidney.  He was found on workup of acute flank pain and nausea and vomiting to have a left proximal ureteral stone.  He also has acute renal failure with a rise in his creatinine to 3, up from baseline of less than 1 and he is aneuric.  Options were discussed for renal decompression including recommended path of ureteral stenting.  He wished proceed and informed consent was obtained and placed in the medical record.  PROCEDURE IN DETAIL:  The patient being, [name], was verified.  Procedure being left ureteral stent placement was confirmed. Procedure was carried out.  Time-out was performed.  Intravenous antibiotics were administered.  General endotracheal anesthesia was introduced and the patient was placed into a medium lithotomy position. Sterile field was created by prepping the patient's penis, perineum, proximal thighs using iodine.  Next, cystourethroscopy was performed using a rigid cystoscope with offset lens.   Inspection of anterior and posterior urethra was unremarkable.  Inspection of bladder revealed no diverticula, calcifications, or papular lesions.  The left ureteral orifice was cannulated with a 6-French end-hole catheter and left retrograde pyelogram was obtained.  Left retrograde pyelogram demonstrated a single left ureter with single- system left kidney.  There was a mobile filling defect in the proximal ureter consistent with a known stone.  A 0.038 Zip wire was advanced to the upper pole, over which a new 6 x 26 Contour-type stent was placed using cystoscopic and fluoroscopic guidance.  Good proximal and distal deployment were noted.  There was immediate efflux of proteinaceous- appearing urine around into the distal end of the stent a sample of this urine was obtained for Gram-stain and culture.  A 16-French Foley catheter was placed for urethra to straight drain and procedure was terminated.  The patient tolerated the procedure well.  There were no immediate periprocedural complications.  The patient was taken to the postanesthesia care in stable condition with plan for hospital admission.          ______________________________ Alexis Frock, MD     TM/MEDQ  D:  09/24/2017  T:  09/24/2017  Job:  [id number]

## 2017-09-24 NOTE — ED Provider Notes (Signed)
Patient presented to the ER with left flank pain.  Symptoms ongoing for 2 days.  Face to face Exam: HEENT - PERRLA Lungs - CTAB Heart - RRR, no M/R/G Abd - S/NT/ND Neuro - alert, oriented x3  Plan: CT shows 4 mm left ureteral stone.  Patient has congenitally absent right kidney.  Baseline creatinine is 0.9, today it is 3.0.  Will consult urology to determine if he requires intervention today.   Orpah Greek, MD 09/24/17 [phone]

## 2017-09-24 NOTE — ED Notes (Signed)
ED Provider at bedside. 

## 2017-09-24 NOTE — Anesthesia Procedure Notes (Signed)
Procedure Name: Intubation Date/Time: 09/24/2017 10:47 AM Performed by: Clearnce Sorrel, CRNA Pre-anesthesia Checklist: Patient identified, Emergency Drugs available, Suction available, Patient being monitored and Timeout performed Patient Re-evaluated:Patient Re-evaluated prior to induction Oxygen Delivery Method: Circle system utilized Preoxygenation: Pre-oxygenation with 100% oxygen Induction Type: IV induction, Rapid sequence and Cricoid Pressure applied Laryngoscope Size: 4 Grade View: Grade I Tube size: 7.5 mm Number of attempts: 1 Airway Equipment and Method: Stylet Placement Confirmation: ETT inserted through vocal cords under direct vision,  positive ETCO2 and breath sounds checked- equal and bilateral Secured at: 24 cm Tube secured with: Tape Dental Injury: Teeth and Oropharynx as per pre-operative assessment

## 2017-09-24 NOTE — ED Triage Notes (Signed)
Pt reports LLQ abd pain X several days, denies NV/D. Pt very vague about S/S. States pain is 10/10 and is sharp.

## 2017-09-24 NOTE — Transfer of Care (Signed)
Immediate Anesthesia Transfer of Care Note  Patient: Bradley Morgan  Procedure(s) Performed: RENAL CYSTOSCOPY LEFT RETROGRADE, LEFT URETERAL STENT PLACEMENT (Left )  Patient Location: PACU  Anesthesia Type:General  Level of Consciousness: awake, alert  and oriented  Airway & Oxygen Therapy: Patient Spontanous Breathing and Patient connected to nasal cannula oxygen  Post-op Assessment: Report given to RN and Post -op Vital signs reviewed and stable  Post vital signs: Reviewed and stable  Last Vitals:  Vitals:   09/24/17 0642 09/24/17 0700  BP:  (!) 167/100  Pulse:  95  Resp:    Temp: 37.6 C   SpO2:  94%    Last Pain:  Vitals:   09/24/17 0956  TempSrc:   PainSc: 10-Worst pain ever         Complications: No apparent anesthesia complications

## 2017-09-24 NOTE — Brief Op Note (Signed)
09/24/2017  11:03 AM  PATIENT:  Bradley Morgan  67 y.o. male  PRE-OPERATIVE DIAGNOSIS:  left renal ureterlal stone, renal failure and sepsis  POST-OPERATIVE DIAGNOSIS:  left renal ureterlal stone, renal failure and sepsis  PROCEDURE:  Procedure(s): RENAL CYSTOSCOPY LEFT RETROGRADE, LEFT URETERAL STENT PLACEMENT (Left)  SURGEON:  Surgeon(s) and Role:    Alexis Frock, MD - Primary  PHYSICIAN ASSISTANT:   ASSISTANTS: none   ANESTHESIA:   general  EBL:  none  BLOOD ADMINISTERED:none  DRAINS: 45F foley to gravity   LOCAL MEDICATIONS USED:  MARCAINE     SPECIMEN:  Source of Specimen:  urine  DISPOSITION OF SPECIMEN:  microbiology for gram stain and culture  COUNTS:  YES  TOURNIQUET:  * No tourniquets in log *  DICTATION: .Other Dictation: Dictation Number [id number]  PLAN OF CARE: Admit to inpatient   PATIENT DISPOSITION:  PACU - hemodynamically stable.   Delay start of Pharmacological VTE agent (>24hrs) due to surgical blood loss or risk of bleeding: yes

## 2017-09-25 ENCOUNTER — Other Ambulatory Visit: Payer: Self-pay

## 2017-09-25 ENCOUNTER — Encounter (HOSPITAL_COMMUNITY): Payer: Self-pay | Admitting: Urology

## 2017-09-25 LAB — URINE CULTURE: Culture: NO GROWTH

## 2017-09-25 LAB — BASIC METABOLIC PANEL
Anion gap: 9 (ref 5–15)
BUN: 24 mg/dL — AB (ref 6–20)
CO2: 24 mmol/L (ref 22–32)
CREATININE: 1.8 mg/dL — AB (ref 0.61–1.24)
Calcium: 8.8 mg/dL — ABNORMAL LOW (ref 8.9–10.3)
Chloride: 105 mmol/L (ref 101–111)
GFR calc Af Amer: 44 mL/min — ABNORMAL LOW (ref >60)
GFR, EST NON AFRICAN AMERICAN: 38 mL/min — AB (ref >60)
GLUCOSE: 132 mg/dL — AB (ref 65–99)
Potassium: 4.5 mmol/L (ref 3.5–5.1)
SODIUM: 138 mmol/L (ref 135–145)

## 2017-09-25 MED ORDER — SENNOSIDES-DOCUSATE SODIUM 8.6-50 MG PO TABS: 1.0000 | ORAL_TABLET | Freq: Two times a day (BID) | ORAL | 0 refills | Status: DC

## 2017-09-25 MED ORDER — OXYCODONE-ACETAMINOPHEN 5-325 MG PO TABS: 1.0000 | ORAL_TABLET | ORAL | 0 refills | Status: DC | PRN

## 2017-09-25 MED ORDER — AMOXICILLIN-POT CLAVULANATE 875-125 MG PO TABS: 1.0000 | ORAL_TABLET | Freq: Two times a day (BID) | ORAL | 0 refills | Status: AC

## 2017-09-25 MED ORDER — INFLUENZA VAC SPLIT HIGH-DOSE 0.5 ML IM SUSY: 0.5000 mL | PREFILLED_SYRINGE | INTRAMUSCULAR | Status: DC

## 2017-09-25 NOTE — Care Management Obs Status (Signed)
[place] NOTIFICATION   Patient Details  Name: KEVIS QU MRN: [id number] Date of Birth: 05/01/1951   Medicare Observation Status Notification Given:       Delrae Sawyers, RN 09/25/2017, 3:52 PM

## 2017-09-25 NOTE — Care Management CC44 (Addendum)
Condition Code 44 Documentation Completed  Patient Details  Name: Bradley Morgan MRN: [id number] Date of Birth: 03/14/1951   Condition Code 38 given:   y Patient signature on Condition Code 29 notice:   y Documentation of 2 MD's agreement: y   Code 38 added to claim:   y    Delrae Sawyers, RN 09/25/2017, 3:52 PM

## 2017-09-25 NOTE — Progress Notes (Signed)
Monna Fam to be D/C'd  per MD order. Discussed with the patient and all questions fully answered.  VSS, Skin clean, dry and intact without evidence of skin break down, no evidence of skin tears noted.  IV catheter discontinued intact. Site without signs and symptoms of complications. Dressing and pressure applied.  An After Visit Summary was printed and given to the patient. Patient received prescription.  D/c education completed with patient/family including follow up instructions, medication list, d/c activities limitations if indicated, with other d/c instructions as indicated by MD - patient able to verbalize understanding, all questions fully answered.   Patient instructed to return to ED, call 911, or call MD for any changes in condition.   Patient to be escorted via [place], and D/C home via private auto.

## 2017-09-25 NOTE — Discharge Summary (Signed)
Physician Discharge Summary  Patient ID: Bradley Morgan MRN: [id number] DOB/AGE: Jun 28, 1951 67 y.o.  Admit date: 09/24/2017 Discharge date: 09/25/2017  Admission Diagnoses:   Discharge Diagnoses:  Active Problems:   Ureteral stone with hydronephrosis   Discharged Condition: good  Hospital Course:   1 - Left Ureteral Stone in Solitary Kidney with Acute Renal Failure -  S/p urgent cysto / left 6x26 contour stent placement 09/24/17 for 28mm left proximal ureteral stone with mod hydro in congenitally solitary kidney by ER CT 09/24/17 on eval left flank pain and nusea.  Admit Cr 3.0, POD 1 Cr 1.8 with copious urine output.   2 - Fevers, Suspect Pyelonephritis - Fevers to 100, tachycardic, significant left perinephric stranding concerning for pyelo / early urosepsis. UA with some pyuria,  Placed on imperic rocephin x 48 hours until fever free and transitioned to PO augmentin. Final CX 1/5 negative.      Consults: None  Significant Diagnostic Studies: labs: as per abvoe  Treatments: surgery: as per above  Discharge Exam: Blood pressure (!) 150/80, pulse 80, temperature 98.2 F (36.8 C), temperature source Oral, resp. rate 17, height 6' (1.829 m), weight 117.9 kg (260 lb), SpO2 95 %.   General appearance: alert, cooperative and appears stated age. Family at bedside.  Eyes: negative Nose: Nares normal. Septum midline. Mucosa normal. No drainage or sinus tenderness. Throat: lips, mucosa, and tongue normal; teeth and gums normal Neck: supple, symmetrical, trachea midline Back: symmetric, no curvature. ROM normal. No CVA tenderness. Resp: non-labored on room air.  Cardio: Nl rate GI: soft, non-tender; bowel sounds normal; no masses,  no organomegaly Male genitalia: normal Extremities: extremities normal, atraumatic, no cyanosis or edema Pulses: 2+ and symmetric Skin: Skin color, texture, turgor normal. No rashes or lesions Lymph nodes: Cervical, supraclavicular, and axillary nodes  normal. Neurologic: Grossly normal    Disposition: 01-Home or Self Care   Allergies as of 09/25/2017      Reactions   Morphine And Related Shortness Of Breath   Aspirin Other (See Comments)   No high doses      Medication List    STOP taking these medications   HYDROcodone-acetaminophen 5-325 MG tablet Commonly known as:  NORCO/VICODIN   oxyCODONE 5 MG immediate release tablet Commonly known as:  Oxy IR/ROXICODONE     TAKE these medications   amoxicillin-clavulanate 875-125 MG tablet Commonly known as:  AUGMENTIN Take 1 tablet by mouth 2 (two) times daily for 14 days. Take for 10 days now. Also begin 3 days before next Urology surgery.   aspirin EC 81 MG tablet Take 81 mg by mouth daily.   carvedilol 3.125 MG tablet Commonly known as:  COREG Take 3.125 mg by mouth daily.   ciprofloxacin 500 MG tablet Commonly known as:  CIPRO Take 500 mg by mouth 2 (two) times daily.   CLARITIN-D 12 HOUR PO Take 1 tablet daily as needed by mouth (for allergies).   clonazePAM 0.5 MG tablet Commonly known as:  KLONOPIN Take 0.5 mg 3 (three) times daily as needed by mouth for anxiety.   cyclobenzaprine 10 MG tablet Commonly known as:  FLEXERIL Take 1 tablet (10 mg total) 3 (three) times daily as needed by mouth for muscle spasms.   gabapentin 300 MG capsule Commonly known as:  NEURONTIN Take 300-600 mg See admin instructions by mouth. Take 300 mg by mouth in the morning, take 300 mg by mouth in the afternoon, take 300 mg by mouth at supper and take 600  mg by mouth at bedtime   lisinopril 10 MG tablet Commonly known as:  PRINIVIL,ZESTRIL Take 10 mg by mouth at bedtime.   oxyCODONE-acetaminophen 5-325 MG tablet Commonly known as:  PERCOCET/ROXICET Take 1-2 tablets by mouth every 4 (four) hours as needed for moderate pain or severe pain. Post-operatively.   pantoprazole 40 MG tablet Commonly known as:  PROTONIX Take 40 mg daily by mouth.   rOPINIRole 1 MG tablet Commonly  known as:  REQUIP Take 1 mg See admin instructions by mouth. Take 1 mg by mouth at bedtime. May take additional 1 mg if needed for restless leg   senna-docusate 8.6-50 MG tablet Commonly known as:  Senokot-S Take 1 tablet by mouth 2 (two) times daily. While taking strong pain meds to prevent constipation   simvastatin 40 MG tablet Commonly known as:  ZOCOR Take 40 mg daily by mouth.   traMADol 50 MG tablet Commonly known as:  ULTRAM Take 1-2 tablets (50-100 mg total) every 6 (six) hours as needed by mouth for moderate pain.      Follow-up Information    Alexis Frock, MD Follow up.   Specialty:  Urology Why:  Office will contact you to arrange next stage surgery for stone removal.  Contact information: [place] [address] [phone]           Signed: Alexis Frock 09/25/2017, 2:50 PM

## 2017-09-25 NOTE — Progress Notes (Signed)
1 Day Post-Op   Subjective/Chief Complaint:  1 - Left Ureteral Stone in Solitary Kidney with Acute Renal Failure -  S/p urgent cysto / left 6x26 contour stent placement 09/24/17 for 45mm left proximal ureteral stone with mod hydro in congenitally solitary kidney by ER CT 09/24/17 on eval left flank pain and nusea.  Admit Cr 3.0.  2 - Fevers, Suspect Pyelonephritis - Fevers to 100, tachycardic, significant left perinephric stranding concerning for pyelo / early urosepsis. UA with some pyuria, UCX 1/5 pending,  Placed on imperic rocephin.   Today "harold" is stable. Some low grade fevers overnight trending down. Cr trending better at 1.8 now and copious UOP.     Objective: Vital signs in last 24 hours: Temp:  [97.5 F (36.4 C)-99.7 F (37.6 C)] 98.3 F (36.8 C) (01/06 0218) Pulse Rate:  [78-96] 83 (01/06 0218) Resp:  [16-20] 18 (01/06 0218) BP: (122-167)/(54-100) 145/73 (01/06 0218) SpO2:  [89 %-99 %] 93 % (01/06 0218)    Intake/Output from previous day: 01/05 0701 - 01/06 0700 In: 2466.3 [P.O.:740; I.V.:1726.3] Out: 4400 [Urine:4400] Intake/Output this shift: Total I/O In: 1786.3 [P.O.:460; I.V.:1326.3] Out: 1900 [Urine:1900]  General appearance: alert, cooperative and appears stated age Eyes: negative Nose: Nares normal. Septum midline. Mucosa normal. No drainage or sinus tenderness. Throat: lips, mucosa, and tongue normal; teeth and gums normal Neck: supple, symmetrical, trachea midline Back: symmetric, no curvature. ROM normal. No CVA tenderness. Resp: non-labored on minimal  O2 Cardio: Nl rate GI: soft, non-tender; bowel sounds normal; no masses,  no organomegaly Male genitalia: normal Extremities: extremities normal, atraumatic, no cyanosis or edema Pulses: 2+ and symmetric Skin: Skin color, texture, turgor normal. No rashes or lesions Lymph nodes: Cervical, supraclavicular, and axillary nodes normal. Neurologic: Grossly normal  Lab Results:  Recent Labs     09/24/17 0403  WBC 9.8  HGB 11.5*  HCT 37.9*  PLT 213   BMET Recent Labs    09/24/17 0403 09/25/17 0430  NA 138 138  K 4.9 4.5  CL 104 105  CO2 23 24  GLUCOSE 118* 132*  BUN 23* 24*  CREATININE 3.01* 1.80*  CALCIUM 9.3 8.8*   PT/INR No results for input(s): LABPROT, INR in the last 72 hours. ABG No results for input(s): PHART, HCO3 in the last 72 hours.  Invalid input(s): PCO2, PO2  Studies/Results: Ct Abdomen Pelvis Wo Contrast  Result Date: 09/24/2017 CLINICAL DATA:  Left-sided abdominal pain.  Nausea. EXAM: CT ABDOMEN AND PELVIS WITHOUT CONTRAST TECHNIQUE: Multidetector CT imaging of the abdomen and pelvis was performed following the standard protocol without IV contrast. COMPARISON:  CT abdomen pelvis 10/05/2014. FINDINGS: Lower chest: Normal heart size. Minimal atelectasis/ scarring left lower lobe. No pleural effusion. Hepatobiliary: Liver is normal in size and contour. Gallbladder is unremarkable. No intrahepatic or extrahepatic biliary ductal dilatation. Pancreas: Unremarkable Spleen: Unremarkable Adrenals/Urinary Tract: Unchanged 4 cm left adrenal nodule, most compatible with adenoma. Right adrenal gland is unremarkable. The right kidney is absent. There is a 4 mm stone within the proximal left ureter (image 47; series 3) resulting in moderate left hydroureteronephrosis. There is a significant amount of fluid within the perinephric space suggestive of possible calyceal rupture. Possible punctate 1-2 mm stone inferior pole left kidney (image 47; series 6). Stomach/Bowel: Descending and sigmoid colonic diverticulosis. No CT evidence for acute diverticulitis. Normal appendix. Normal morphology of the stomach. Vascular/Lymphatic: Normal caliber abdominal aorta. Retroaortic left renal vein. Atherosclerotic plaque involving the abdominal aorta. No retroperitoneal lymphadenopathy. Reproductive: Central dystrophic calcifications  in the prostate. Other: Small bilateral fat containing  inguinal hernias. Musculoskeletal: Lumbar spine degenerative changes. No aggressive or acute appearing osseous lesions. IMPRESSION: 1. There is a 4 mm obstructing stone within the proximal left ureter resulting in mild to moderate left hydroureteronephrosis. There is extensive fluid about the left kidney and ureter suggestive of calyceal rupture. 2. Colonic diverticulosis without evidence for acute diverticulitis. Electronically Signed   By: Lovey Newcomer M.D.   On: 09/24/2017 08:01   Dg Retrograde-urethrogram  Result Date: 09/24/2017 CLINICAL DATA:  Renal cystoscopy with left-sided retrograde pyelogram and stent placement EXAM: INTRAOPERATIVE LEFT RETROGRADE UROGRAPHY TECHNIQUE: Images were obtained with the C-arm fluoroscopic device intraoperatively and submitted for interpretation post-operatively. Please see the procedural report for the amount of contrast and the fluoroscopy time utilized. FLUOROSCOPY TIME:  19 seconds COMPARISON:  CT abdomen pelvis - 09/24/2017 FINDINGS: 3 spot intraoperative fluoroscopic images during left-sided retrograde pyelogram and double-J ureteral stent placement are provided for review. Initial image demonstrates a scope overlying expected location of the urinary bladder. There is selective cannulation and opacification of the distal aspect of the left ureter which appears nondilated. A small amount of contrast is seen within the decompressed urinary bladder. Subsequent image demonstrates the superior aspect of a left-sided double-J ureteral stent overlying the left renal pelvis. Contrast injection demonstrates mild left-sided pelvicaliectasis, similar to preceding abdominal CT. Final image demonstrates the caudal aspect of the left-sided double-J ureteral stent overlying the left lateral aspect of the urinary bladder. IMPRESSION: Left-sided retrograde pyelogram and double-J ureteral stent placement as above. Electronically Signed   By: Sandi Mariscal M.D.   On: 09/24/2017 11:32     Anti-infectives: Anti-infectives (From admission, onward)   Start     Dose/Rate Route Frequency Ordered Stop   09/24/17 1000  cefTRIAXone (ROCEPHIN) 1 g in dextrose 5 % 50 mL IVPB     1 g 100 mL/hr over 30 Minutes Intravenous Every 24 hours 09/24/17 0948        Assessment/Plan:  1 - Left Ureteral Stone in Solitary Kidney with Acute Renal Failure -  Now temporized with stenting, rec ureteroscopy in elective setting in few weeks. GFR improving, DC foley.   2 - Fevers, Suspect Pyelonephritis - continue rocephin until afebrile / prelim UCX resuls.  Remain in house, likely DC tomorrow AM as long as no progressive fevers.     [facility], Toris Laverdiere 09/25/2017

## 2017-09-25 NOTE — Discharge Instructions (Signed)
1 - You may have urinary urgency (bladder spasms) and bloody urine on / off with stent in place. This is normal. ° °2 - Call MD or go to ER for fever >102, severe pain / nausea / vomiting not relieved by medications, or acute change in medical status ° °

## 2017-09-27 ENCOUNTER — Other Ambulatory Visit: Payer: Self-pay | Admitting: Urology

## 2017-09-29 ENCOUNTER — Encounter (HOSPITAL_BASED_OUTPATIENT_CLINIC_OR_DEPARTMENT_OTHER): Payer: Self-pay | Admitting: *Deleted

## 2017-09-29 ENCOUNTER — Other Ambulatory Visit: Payer: Self-pay

## 2017-09-29 NOTE — Progress Notes (Signed)
SPOKE W/ PT VIA PHONE FOR PRE-OP INTERVIEW.  NPO AFTER MN.  ARRIVE AT [phone].  CURRENT LAB RESULTS AND EKG IN CHART AND Epic.

## 2017-10-07 ENCOUNTER — Ambulatory Visit (HOSPITAL_BASED_OUTPATIENT_CLINIC_OR_DEPARTMENT_OTHER)
Admission: RE | Admit: 2017-10-07 | Discharge: 2017-10-07 | Disposition: A | Discharge status: Home / Self Care | Payer: Medicare Other | Source: Ambulatory Visit | Attending: Urology | Admitting: Urology

## 2017-10-07 ENCOUNTER — Ambulatory Visit (HOSPITAL_BASED_OUTPATIENT_CLINIC_OR_DEPARTMENT_OTHER): Payer: Medicare Other | Admitting: Anesthesiology

## 2017-10-07 ENCOUNTER — Encounter (HOSPITAL_BASED_OUTPATIENT_CLINIC_OR_DEPARTMENT_OTHER): Payer: Self-pay | Admitting: Anesthesiology

## 2017-10-07 ENCOUNTER — Encounter (HOSPITAL_BASED_OUTPATIENT_CLINIC_OR_DEPARTMENT_OTHER)
Admission: RE | Disposition: A | Discharge status: Home / Self Care | Payer: Self-pay | Source: Ambulatory Visit | Attending: Urology

## 2017-10-07 DIAGNOSIS — Z8711 Personal history of peptic ulcer disease: Secondary | ICD-10-CM | POA: Insufficient documentation

## 2017-10-07 DIAGNOSIS — Z886 Allergy status to analgesic agent status: Secondary | ICD-10-CM | POA: Insufficient documentation

## 2017-10-07 DIAGNOSIS — K219 Gastro-esophageal reflux disease without esophagitis: Secondary | ICD-10-CM | POA: Insufficient documentation

## 2017-10-07 DIAGNOSIS — Z8249 Family history of ischemic heart disease and other diseases of the circulatory system: Secondary | ICD-10-CM | POA: Diagnosis not present

## 2017-10-07 DIAGNOSIS — Z87891 Personal history of nicotine dependence: Secondary | ICD-10-CM | POA: Insufficient documentation

## 2017-10-07 DIAGNOSIS — Z7982 Long term (current) use of aspirin: Secondary | ICD-10-CM | POA: Insufficient documentation

## 2017-10-07 DIAGNOSIS — E785 Hyperlipidemia, unspecified: Secondary | ICD-10-CM | POA: Diagnosis not present

## 2017-10-07 DIAGNOSIS — I10 Essential (primary) hypertension: Secondary | ICD-10-CM | POA: Insufficient documentation

## 2017-10-07 DIAGNOSIS — Z6834 Body mass index (BMI) 34.0-34.9, adult: Secondary | ICD-10-CM | POA: Diagnosis not present

## 2017-10-07 DIAGNOSIS — Z885 Allergy status to narcotic agent status: Secondary | ICD-10-CM | POA: Insufficient documentation

## 2017-10-07 DIAGNOSIS — N201 Calculus of ureter: Secondary | ICD-10-CM | POA: Insufficient documentation

## 2017-10-07 DIAGNOSIS — Z818 Family history of other mental and behavioral disorders: Secondary | ICD-10-CM | POA: Diagnosis not present

## 2017-10-07 DIAGNOSIS — Z981 Arthrodesis status: Secondary | ICD-10-CM | POA: Insufficient documentation

## 2017-10-07 DIAGNOSIS — G629 Polyneuropathy, unspecified: Secondary | ICD-10-CM | POA: Insufficient documentation

## 2017-10-07 DIAGNOSIS — Z79899 Other long term (current) drug therapy: Secondary | ICD-10-CM | POA: Diagnosis not present

## 2017-10-07 DIAGNOSIS — Z96652 Presence of left artificial knee joint: Secondary | ICD-10-CM | POA: Insufficient documentation

## 2017-10-07 DIAGNOSIS — Z9889 Other specified postprocedural states: Secondary | ICD-10-CM | POA: Diagnosis not present

## 2017-10-07 DIAGNOSIS — Z87442 Personal history of urinary calculi: Secondary | ICD-10-CM | POA: Diagnosis not present

## 2017-10-07 DIAGNOSIS — Q600 Renal agenesis, unilateral: Secondary | ICD-10-CM | POA: Insufficient documentation

## 2017-10-07 DIAGNOSIS — G2581 Restless legs syndrome: Secondary | ICD-10-CM | POA: Diagnosis not present

## 2017-10-07 DIAGNOSIS — Z833 Family history of diabetes mellitus: Secondary | ICD-10-CM | POA: Diagnosis not present

## 2017-10-07 HISTORY — DX: Presence of spectacles and contact lenses: Z97.3

## 2017-10-07 HISTORY — DX: Presence of dental prosthetic device (complete) (partial): Z97.2

## 2017-10-07 HISTORY — DX: Personal history of other diseases of the nervous system and sense organs: Z86.69

## 2017-10-07 HISTORY — DX: Personal history of other diseases of urinary system: Z87.448

## 2017-10-07 HISTORY — DX: Personal history of other diseases of male genital organs: Z87.438

## 2017-10-07 HISTORY — DX: Other bursitis of elbow, unspecified elbow: M70.30

## 2017-10-07 HISTORY — DX: Personal history of other diseases of the digestive system: Z87.19

## 2017-10-07 HISTORY — DX: Frequency of micturition: R35.0

## 2017-10-07 HISTORY — PX: CYSTOSCOPY WITH RETROGRADE PYELOGRAM, URETEROSCOPY AND STENT PLACEMENT: SHX5789

## 2017-10-07 HISTORY — DX: Generalized anxiety disorder: F41.1

## 2017-10-07 HISTORY — DX: Reserved for concepts with insufficient information to code with codable children: IMO0002

## 2017-10-07 HISTORY — DX: Benign neoplasm of left adrenal gland: D35.02

## 2017-10-07 HISTORY — DX: Renal agenesis, unilateral: Q60.0

## 2017-10-07 HISTORY — DX: Hyperlipidemia, unspecified: E78.5

## 2017-10-07 HISTORY — DX: Calculus of ureter: N20.1

## 2017-10-07 HISTORY — DX: Complete loss of teeth, unspecified cause, unspecified class: K08.109

## 2017-10-07 HISTORY — DX: Diverticulosis of large intestine without perforation or abscess without bleeding: K57.30

## 2017-10-07 HISTORY — DX: Nocturia: R35.1

## 2017-10-07 HISTORY — DX: Polyneuropathy, unspecified: G62.9

## 2017-10-07 SURGERY — CYSTOURETEROSCOPY, WITH RETROGRADE PYELOGRAM AND STENT INSERTION
Anesthesia: General | Laterality: Left

## 2017-10-07 MED ORDER — OXYCODONE-ACETAMINOPHEN 5-325 MG PO TABS
1.0000 | ORAL_TABLET | ORAL | Status: DC | PRN
  Administered 2017-10-07: 2 via ORAL
  Filled 2017-10-07: qty 2

## 2017-10-07 MED ORDER — DEXAMETHASONE SODIUM PHOSPHATE 10 MG/ML IJ SOLN
INTRAMUSCULAR | Status: AC
  Filled 2017-10-07: qty 1

## 2017-10-07 MED ORDER — LIDOCAINE 2% (20 MG/ML) 5 ML SYRINGE
INTRAMUSCULAR | Status: AC
  Filled 2017-10-07: qty 5

## 2017-10-07 MED ORDER — DEXAMETHASONE SODIUM PHOSPHATE 4 MG/ML IJ SOLN
INTRAMUSCULAR | Status: DC | PRN
  Administered 2017-10-07: 10 mg via INTRAVENOUS

## 2017-10-07 MED ORDER — FENTANYL CITRATE (PF) 100 MCG/2ML IJ SOLN
INTRAMUSCULAR | Status: AC
  Filled 2017-10-07: qty 2

## 2017-10-07 MED ORDER — ONDANSETRON HCL 4 MG/2ML IJ SOLN
4.0000 mg | Freq: Once | INTRAMUSCULAR | Status: DC | PRN
  Filled 2017-10-07: qty 2

## 2017-10-07 MED ORDER — SODIUM CHLORIDE 0.9 % IV SOLN
INTRAVENOUS | Status: DC
  Administered 2017-10-07 (×2): via INTRAVENOUS
  Filled 2017-10-07: qty 1000

## 2017-10-07 MED ORDER — CEFTRIAXONE SODIUM 2 G IJ SOLR
INTRAMUSCULAR | Status: AC
Start: 2017-10-07 — End: 2017-10-07
  Filled 2017-10-07: qty 2

## 2017-10-07 MED ORDER — OXYCODONE-ACETAMINOPHEN 5-325 MG PO TABS
ORAL_TABLET | ORAL | Status: AC
  Filled 2017-10-07: qty 1

## 2017-10-07 MED ORDER — MIDAZOLAM HCL 5 MG/5ML IJ SOLN
INTRAMUSCULAR | Status: DC | PRN
  Administered 2017-10-07: 2 mg via INTRAVENOUS

## 2017-10-07 MED ORDER — PROPOFOL 10 MG/ML IV BOLUS
INTRAVENOUS | Status: AC
  Filled 2017-10-07: qty 40

## 2017-10-07 MED ORDER — FENTANYL CITRATE (PF) 100 MCG/2ML IJ SOLN
INTRAMUSCULAR | Status: DC | PRN
  Administered 2017-10-07: 25 ug via INTRAVENOUS
  Administered 2017-10-07 (×3): 50 ug via INTRAVENOUS
  Administered 2017-10-07: 25 ug via INTRAVENOUS

## 2017-10-07 MED ORDER — KETOROLAC TROMETHAMINE 30 MG/ML IJ SOLN
INTRAMUSCULAR | Status: AC
  Filled 2017-10-07: qty 1

## 2017-10-07 MED ORDER — FENTANYL CITRATE (PF) 100 MCG/2ML IJ SOLN
25.0000 ug | INTRAMUSCULAR | Status: DC | PRN
  Administered 2017-10-07: 50 ug via INTRAVENOUS
  Filled 2017-10-07: qty 1

## 2017-10-07 MED ORDER — ONDANSETRON HCL 4 MG/2ML IJ SOLN
INTRAMUSCULAR | Status: AC
  Filled 2017-10-07: qty 2

## 2017-10-07 MED ORDER — PROPOFOL 10 MG/ML IV BOLUS
INTRAVENOUS | Status: DC | PRN
  Administered 2017-10-07 (×2): 50 mg via INTRAVENOUS
  Administered 2017-10-07: 200 mg via INTRAVENOUS

## 2017-10-07 MED ORDER — ONDANSETRON HCL 4 MG/2ML IJ SOLN
INTRAMUSCULAR | Status: DC | PRN
  Administered 2017-10-07: 4 mg via INTRAVENOUS

## 2017-10-07 MED ORDER — DEXTROSE 5 % IV SOLN
INTRAVENOUS | Status: AC
  Filled 2017-10-07: qty 50

## 2017-10-07 MED ORDER — OXYCODONE-ACETAMINOPHEN 5-325 MG PO TABS: 1.0000 | ORAL_TABLET | ORAL | 0 refills | Status: DC | PRN

## 2017-10-07 MED ORDER — MIDAZOLAM HCL 2 MG/2ML IJ SOLN
INTRAMUSCULAR | Status: AC
  Filled 2017-10-07: qty 2

## 2017-10-07 MED ORDER — LIDOCAINE 2% (20 MG/ML) 5 ML SYRINGE
INTRAMUSCULAR | Status: DC | PRN
  Administered 2017-10-07: 100 mg via INTRAVENOUS

## 2017-10-07 MED ORDER — CEFTRIAXONE SODIUM 2 G IJ SOLR
2.0000 g | INTRAMUSCULAR | Status: AC
  Administered 2017-10-07: 2 g via INTRAVENOUS
  Filled 2017-10-07: qty 2

## 2017-10-07 SURGICAL SUPPLY — 24 items
BAG DRAIN URO-CYSTO SKYTR STRL (DRAIN) ×2 IMPLANT
BAG DRN UROCATH (DRAIN) ×1
BASKET LASER NITINOL 1.9FR (BASKET) ×1 IMPLANT
BSKT STON RTRVL 120 1.9FR (BASKET) ×1
CATH INTERMIT  6FR 70CM (CATHETERS) ×1 IMPLANT
CLOTH BEACON ORANGE TIMEOUT ST (SAFETY) ×2 IMPLANT
DRSG TEGADERM 4X4.75 (GAUZE/BANDAGES/DRESSINGS) ×1 IMPLANT
FIBER LASER FLEXIVA 365 (UROLOGICAL SUPPLIES) IMPLANT
FIBER LASER TRAC TIP (UROLOGICAL SUPPLIES) IMPLANT
GLOVE BIO SURGEON STRL SZ7.5 (GLOVE) ×2 IMPLANT
GOWN STRL REUS W/TWL LRG LVL3 (GOWN DISPOSABLE) ×2 IMPLANT
GUIDEWIRE ANG ZIPWIRE 038X150 (WIRE) ×2 IMPLANT
GUIDEWIRE STR DUAL SENSOR (WIRE) ×2 IMPLANT
INFUSOR MANOMETER BAG 3000ML (MISCELLANEOUS) ×2 IMPLANT
IV NS 1000ML (IV SOLUTION) ×2
IV NS 1000ML BAXH (IV SOLUTION) ×1 IMPLANT
IV NS IRRIG 3000ML ARTHROMATIC (IV SOLUTION) ×2 IMPLANT
KIT RM TURNOVER CYSTO AR (KITS) ×2 IMPLANT
MANIFOLD NEPTUNE II (INSTRUMENTS) ×2 IMPLANT
NS IRRIG 500ML POUR BTL (IV SOLUTION) ×4 IMPLANT
PACK CYSTO (CUSTOM PROCEDURE TRAY) ×2 IMPLANT
SYRINGE 10CC LL (SYRINGE) ×2 IMPLANT
TUBE CONNECTING 12X1/4 (SUCTIONS) ×1 IMPLANT
TUBE FEEDING 8FR 16IN STR KANG (MISCELLANEOUS) IMPLANT

## 2017-10-07 NOTE — Transfer of Care (Signed)
Immediate Anesthesia Transfer of Care Note  Patient: Bradley Morgan  Procedure(s) Performed: Procedure(s) (LRB): CYSTOSCOPY WITH LEFT RETROGRADE PYELOGRAM, BASKETRY OF STONE, URETEROSCOPY AND STENT REMOVAL (Left)  Patient Location: PACU  Anesthesia Type: General  Level of Consciousness: awake, sedated, patient cooperative and responds to stimulation  Airway & Oxygen Therapy: Patient Spontanous Breathing and Patient connected to [place] O2 Post-op Assessment: Report given to PACU RN, Post -op Vital signs reviewed and stable and Patient moving all extremities  Post vital signs: Reviewed and stable  Complications: No apparent anesthesia complications

## 2017-10-07 NOTE — Anesthesia Preprocedure Evaluation (Signed)
Anesthesia Evaluation  Patient identified by MRN, date of birth, ID band Patient awake    Reviewed: Allergy & Precautions, NPO status , Patient's Chart, lab work & pertinent test results  History of Anesthesia Complications (+) PONV  Airway Mallampati: I  TM Distance: >3 FB Neck ROM: Full    Dental  (+) Dental Advisory Given   Pulmonary former smoker,    breath sounds clear to auscultation       Cardiovascular hypertension, Pt. on medications and Pt. on home beta blockers (-) angina Rhythm:Regular Rate:Normal  '15 Stress: Fixed defect in the inferoseptal region but there is no stress-induced ischemia, EF 63%   Neuro/Psych Anxiety negative neurological ROS     GI/Hepatic Neg liver ROS, PUD, GERD  Controlled,  Endo/Other  Morbid obesity  Renal/GU Renal InsufficiencyRenal diseasestones     Musculoskeletal   Abdominal (+) + obese,   Peds  Hematology negative hematology ROS (+)   Anesthesia Other Findings   Reproductive/Obstetrics                             Anesthesia Physical  Anesthesia Plan  ASA: II  Anesthesia Plan: General   Post-op Pain Management:    Induction: Intravenous  PONV Risk Score and Plan: 3 and Ondansetron, Dexamethasone, Scopolamine patch - Pre-op and Treatment may vary due to age or medical condition  Airway Management Planned: LMA  Additional Equipment:   Intra-op Plan:   Post-operative Plan: Extubation in OR  Informed Consent: I have reviewed the patients History and Physical, chart, labs and discussed the procedure including the risks, benefits and alternatives for the proposed anesthesia with the patient or authorized representative who has indicated his/her understanding and acceptance.   Dental advisory given  Plan Discussed with: CRNA and Surgeon  Anesthesia Plan Comments:         Anesthesia Quick Evaluation

## 2017-10-07 NOTE — H&P (Signed)
Urology Admission H&P  Chief Complaint: Pre-OP LEFT Ureteroscopic Stone Manipulation  History of Present Illness:   1 - Left Ureteral Stone in Solitary Kidney  - 68mm left proximal ureteral stone with mod hydro in congenitally solitary kidney by ER CT 09/24/17 on eval left flank pain and nusea. Cr 3.0 up from baseline <1. Treated with urgent ureteral stent 09/24/17. UCX 1/5 negative.    PMH sig for knee replacement, neck / back surgery with some LE instability / neuropathy. No ischemic CV disease / blood thinners.   Today " Bradley Morgan " is seen to proceed with LEFT ureteroscopic stone manipulation. No interval fevers.     Past Medical History:  Diagnosis Date  . Adrenal adenoma, left   . Arthritis   . Bursitis of elbow   . Complication of anesthesia    ANESTHESIA DID NOT TAKE EFFECT WITH BACK SURGERY "I FELT THEM CUT"  . Congenital absence of right kidney   . Diverticulosis of colon   . Family history of anesthesia complication    "hard time waking up my dad" many yrs ago  . Frequency of urination   . Full dentures   . GAD (generalized anxiety disorder)   . GERD (gastroesophageal reflux disease)   . History of acute renal failure    12-16-2013;  09-24-2017 due to stone obstruction  . 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 History of encephalopathy 04/26/2014   ACUTE EPISODE SECONDARY TO DRUG OVERDOSE (NARC, BZD, FLEXERIL)  . History of GI bleed    08/ 2001   duodenal ulcer bleed post egd w/ cauterization  . History of prostatitis   . Hyperlipidemia   . Hypertension   . Left ureteral stone   . Nocturia more than twice per night   . Peripheral neuropathy    bilateral leg numbness due to back issues, walks w/ cane  . PONV (postoperative nausea and vomiting)   . Restless leg syndrome   . Solitary left kidney   . Wears glasses    Past Surgical History:  Procedure Laterality Date  . ANTERIOR CERVICAL  DECOMP/DISCECTOMY FUSION  05-21-2009   dr Maia Plan [facility]   C5-6, C6-7 and aviator plate  . CARDIOVASCULAR STRESS TEST  12/17/2013   FIXED DEFECT IN THE INFEROSEPTAL REGION BUT NO STRESS INDUCED ISCHEMIA/  NORMAL LV FUNCTION AND WALL MOTION , EF 63%  . CLOSED MANIPULATION POST TOTAL KNEE ARTHROPLASTY Left 11-28-2001    dr Wynelle Link  . CYSTOSCOPY WITH STENT PLACEMENT Left 09/24/2017   Procedure: RENAL CYSTOSCOPY LEFT RETROGRADE, LEFT URETERAL STENT PLACEMENT;  Surgeon: Alexis Frock, MD;  Location: [place];  Service: Urology;  Laterality: Left;  . HIP ARTHROSCOPY W/ LABRAL DEBRIDEMENT Left 08/12/2006   dr Wynelle Link [facility]   and chondraplasty  . INCISION AND DRAINAGE ABSCESS Right 03/06/2015   Procedure: INCISION AND DRAINAGE ABSCESS RIGHT ARM;  Surgeon: Daryll Brod, MD;  Location: [place];  Service: Orthopedics;  Laterality: Right;  . KNEE ARTHROSCOPY Left x2  2002  . LUMBAR SPINE SURGERY  x5  last one 2002  . SHOULDER ARTHROSCOPY WITH SUBACROMIAL DECOMPRESSION, ROTATOR CUFF REPAIR AND BICEP TENDON REPAIR Right 06-27-2002  dr Wynelle Link [facility]   labral debridement and DCR  . SHOULDER OPEN ROTATOR CUFF REPAIR Left 07/11/2014   Procedure: ROTATOR CUFF REPAIR SHOULDER OPEN WITH  GRAFT ;  Surgeon: Tobi Bastos, MD;  Location: [facility];  Service: Orthopedics;  Laterality: Left;  . TOTAL KNEE ARTHROPLASTY Left 10-09-2001   dr Wynelle Link [facility]  . TOTAL KNEE REVISION Left 08/03/2017   Procedure: Left knee tibia polyethylene and patella revision;  Surgeon: Gaynelle Arabian, MD;  Location: [facility];  Service: Orthopedics;  Laterality: Left;  with abductor block    Home Medications:  No current facility-administered medications for this encounter.    Current Outpatient Medications  Medication Sig Dispense Refill Last Dose  . amoxicillin-clavulanate (AUGMENTIN) 875-125 MG tablet Take 1 tablet by mouth 2 (two) times daily for 14 days. Take for 10 days now. Also begin 3 days before next Urology surgery. 28  tablet 0   . aspirin EC 81 MG tablet Take 81 mg by mouth every morning.    Past Week at Unknown time  . carvedilol (COREG) 3.125 MG tablet Take 3.125 mg by mouth every evening.    Past Week at Unknown time  . ciprofloxacin (CIPRO) 500 MG tablet Take 500 mg by mouth 2 (two) times daily.   09/22/2017  . clonazePAM (KLONOPIN) 0.5 MG tablet Take 0.5 mg 3 (three) times daily as needed by mouth for anxiety.    unk  . cyclobenzaprine (FLEXERIL) 10 MG tablet Take 1 tablet (10 mg total) 3 (three) times daily as needed by mouth for muscle spasms. 90 tablet 0 unk  . gabapentin (NEURONTIN) 300 MG capsule Take 300-600 mg by mouth See admin instructions. Take 300 mg by mouth in the morning, take 300 mg by mouth in the afternoon, take 300 mg by mouth at supper and take 600 mg by mouth at bedtime   Past Week at Unknown time  . lisinopril (PRINIVIL,ZESTRIL) 10 MG tablet Take 10 mg by mouth at bedtime.    Past Week at Unknown time  . Loratadine-Pseudoephedrine (CLARITIN-D 12 HOUR PO) Take 1 tablet daily as needed by mouth (for allergies).    unk  . oxyCODONE-acetaminophen (PERCOCET/ROXICET) 5-325 MG tablet Take 1-2 tablets by mouth every 4 (four) hours as needed for moderate pain or severe pain. Post-operatively. 30 tablet 0   . pantoprazole (PROTONIX) 40 MG tablet Take 40 mg by mouth 2 (two) times daily.    Past Week at Unknown time  . rOPINIRole (REQUIP) 1 MG tablet Take 1 mg by mouth See admin instructions. Take 1 mg by mouth at bedtime. May take additional 1 mg if needed for restless leg   Past Week at Unknown time  . senna-docusate (SENOKOT-S) 8.6-50 MG tablet Take 1 tablet by mouth 2 (two) times daily. While taking strong pain meds to prevent constipation (Patient taking differently: Take 1 tablet by mouth 2 (two) times daily as needed. While taking strong pain meds to prevent constipation) 20 tablet 0   . simvastatin (ZOCOR) 40 MG tablet Take 40 mg by mouth every evening.    Past Week at Unknown time   Allergies:   Allergies  Allergen Reactions  . Morphine And Related Shortness Of Breath  . Aspirin Other (See Comments)    No high doses  . Nsaids Other (See Comments)    Family History  Problem Relation Age of Onset  . Heart failure Father   . Diabetes Father   . Hypertension Father   . Anxiety disorder Mother        also tended to overdose on anxiety medication   Social History:  reports that he quit smoking about 19 years ago. His smoking use included cigarettes. He quit after 15.00 years of use. he has never used smokeless  tobacco. He reports that he does not drink alcohol or use drugs.  Review of Systems  Constitutional: Negative.  Negative for chills and fever.  HENT: Negative.   Eyes: Negative.   Respiratory: Negative.   Cardiovascular: Negative.   Gastrointestinal: Negative.   Genitourinary: Positive for urgency. Negative for flank pain.  Musculoskeletal: Negative.   Skin: Negative.   Neurological: Negative.   Endo/Heme/Allergies: Negative.   Psychiatric/Behavioral: Negative.     Physical Exam:  Vital signs in last 24 hours:   Physical Exam  Constitutional: He appears well-developed.  HENT:  Head: Normocephalic.  Eyes: Pupils are equal, round, and reactive to light.  Neck: Normal range of motion.  Cardiovascular: Normal rate.  Respiratory: Effort normal.  GI: Soft.  Genitourinary:  Genitourinary Comments: No CVAT at present.   Neurological: He is alert.  Skin: Skin is warm.  Psychiatric: He has a normal mood and affect.        Plan:   1 - Left Ureteral Stone in Solitary Kidney - proceed as planned with LEFT ureteroscopy/ laser lithotripsy/ stent exchange. Risks, benefits, alternatives, expected peri-op course discussed previously and reiterated today.    Tanyia Grabbe 10/07/2017, 6:30 AM

## 2017-10-07 NOTE — Brief Op Note (Signed)
10/07/2017  9:33 AM  PATIENT:  Bradley Morgan  67 y.o. male  PRE-OPERATIVE DIAGNOSIS:  LEFT URETERAL STONE / SOLITARY KIDNEY  POST-OPERATIVE DIAGNOSIS:  LEFT URETERAL STONE / SOLITARY KIDNEY  PROCEDURE:  Procedure(s): CYSTOSCOPY WITH LEFT RETROGRADE PYELOGRAM, BASKETRY OF STONE, URETEROSCOPY AND STENT REMOVAL (Left)  SURGEON:  Surgeon(s) and Role:    * Alexis Frock, MD - Primary  PHYSICIAN ASSISTANT:   ASSISTANTS: none   ANESTHESIA:   general  EBL:  0 mL   BLOOD ADMINISTERED:none  DRAINS: none   LOCAL MEDICATIONS USED:  NONE  SPECIMEN:  Source of Specimen:  left ureteral stone  DISPOSITION OF SPECIMEN:  [facility] for compositional analysis  COUNTS:  YES  TOURNIQUET:  * No tourniquets in log *  DICTATION: .Other Dictation: Dictation Number [id number]  PLAN OF CARE: Discharge to home after PACU  PATIENT DISPOSITION:  PACU - hemodynamically stable.   Delay start of Pharmacological VTE agent (>24hrs) due to surgical blood loss or risk of bleeding: yes

## 2017-10-07 NOTE — Discharge Instructions (Signed)
°  Post Anesthesia Home Care Instructions  Activity: Get plenty of rest for the remainder of the day. A responsible individual must stay with you for 24 hours following the procedure.  For the next 24 hours, DO NOT: -Drive a car -Paediatric nurse -Drink alcoholic beverages -Take any medication unless instructed by your physician -Make any legal decisions or sign important papers.  Meals: Start with liquid foods such as gelatin or soup. Progress to regular foods as tolerated. Avoid greasy, spicy, heavy foods. If nausea and/or vomiting occur, drink only clear liquids until the nausea and/or vomiting subsides. Call your physician if vomiting continues.  Special Instructions/Symptoms: Your throat may feel dry or sore from the anesthesia or the breathing tube placed in your throat during surgery. If this causes discomfort, gargle with warm salt water. The discomfort should disappear within 24 hours.  If you had a scopolamine patch placed behind your ear for the management of post- operative nausea and/or vomiting:  1. The medication in the patch is effective for 72 hours, after which it should be removed.  Wrap patch in a tissue and discard in the trash. Wash hands thoroughly with soap and water. 2. You may remove the patch earlier than 72 hours if you experience unpleasant side effects which may include dry mouth, dizziness or visual disturbances. 3. Avoid touching the patch. Wash your hands with soap and water after contact with the patch.    1 - You may have urinary urgency (bladder spasms) and bloody urine on / off x few days. This is normal.  2 - Call MD or go to ER for fever >102, severe pain / nausea / vomiting not relieved by medications, or acute change in medical status

## 2017-10-07 NOTE — Op Note (Deleted)
  The note originally documented on this encounter has been moved the the encounter in which it belongs.  

## 2017-10-07 NOTE — Anesthesia Procedure Notes (Signed)
Procedure Name: LMA Insertion Date/Time: 10/07/2017 9:19 AM Performed by: Justice Rocher, CRNA Pre-anesthesia Checklist: Patient identified, Emergency Drugs available, Suction available and Patient being monitored Patient Re-evaluated:Patient Re-evaluated prior to induction Oxygen Delivery Method: Circle system utilized Preoxygenation: Pre-oxygenation with 100% oxygen Induction Type: IV induction Ventilation: Mask ventilation without difficulty LMA: LMA inserted LMA Size: 5.0 Number of attempts: 1 Airway Equipment and Method: Bite block Placement Confirmation: positive ETCO2 and breath sounds checked- equal and bilateral Tube secured with: Tape Dental Injury: Teeth and Oropharynx as per pre-operative assessment

## 2017-10-07 NOTE — Op Note (Signed)
NAME:  [name], [name] NO.:  [id number]  MEDICAL RECORD NO.:  [id number]  LOCATION:                               FACILITY:  [facility]  PHYSICIAN:  Alexis Frock, MD     DATE OF BIRTH:  25-Jun-1951  DATE OF PROCEDURE: 10/07/2017                              OPERATIVE REPORT   REPORT TITLE:  Urology Operative Note  BODY AFTER REPORT TITLE:  DIAGNOSES:  Left ureteral stone in solitary kidney.  History of acute renal failure.  PROCEDURES: 1. Cystoscopy with left retrograde pyelogram and interpretation. 2. Removal of left ureteral stent. 3. Left ureteroscopy with basketing of stone.  ESTIMATED BLOOD LOSS:  Nil.  COMPLICATIONS:  None.  SPECIMEN:  Left ureteral stone for compositional analysis.  FINDINGS: 1. Anterograde progression of prior left proximal ureteral stone to     the distal left ureter. 2. Stone amenable to simple basketing. 3. Wide open ureter from bladder to area of kidney following stone     removal.  It was not felt that repeat stenting would be warranted.  INDICATION:  [name] is a 67 year old gentleman with history of congenital solitary kidney.  He had acute onset of left flank pain and malaise earlier this month.  He was found to have a proximal ureteral stone.  He underwent stenting at that time given concern for bacteriuria as well as acute renal failure.  His culture has since come back negative and he presents now for definitive stone management.  Informed consent was obtained and placed in the medical record.  PROCEDURE IN DETAIL:  The patient being Bradley Morgan, was verified.  Procedure being left ureteroscopic stone manipulation was confirmed.  Procedure was carried out.  Time-out was performed. Intravenous antibiotics were administered.  General LMA anesthesia introduced.  The patient was placed into a low lithotomy position. Sterile field was created by prepping and draping the patient's penis, perineum and proximal  thighs using iodine.  Next, cystourethroscopy was performed using a 22-French rigid cystoscope with offset lens. Inspection of the anterior and posterior urethra unremarkable. Inspection of the urinary bladder revealed solitary left ureteral orifice of the distal stent seen exiting, this was grasped and brought to the level of the urethral meatus.  Through which, a 0.038 Zip wire was advanced to the level of the upper pole, set aside as a safety wire. An 8-French feeding tube placed in the urinary bladder for pressure release and left retrograde pyelogram was obtained.  Left retrograde pyelogram demonstrated a single left ureter with single- system left kidney.  There was a free-floating defect in the mid ureter consistent with known stone.  Next, semi-rigid ureteroscopy was performed to the distal left ureter alongside a separate Sensor working wire.  As expected, the stone in question was seen as it progressed antegrade into the mid to distal left ureter.  It was very fusiform and the ureter was quite capacious, it did appear amenable to simple basketing.  As such, it was grasped with an Escape basket on its long axis.  It was removed and set aside for conscious analysis.  Repeat semi- rigid ureteroscopy of entire length of left ureter revealed no additional calcifications.  There were no mucosal abnormalities.  It was not felt that interval stenting would be warranted.  As such, the ureteroscope and safety wire were removed.  Bladder was emptied per cystoscope.  Procedure was then terminated.  The patient tolerated the procedure well.  There were no immediate periprocedural complications. The patient was taken to the postanesthesia care unit in stable condition.    ______________________________ Alexis Frock, MD   ______________________________ Alexis Frock, MD    TM/MEDQ  D:  10/07/2017  T:  10/07/2017  Job:  [id number]

## 2017-10-10 ENCOUNTER — Encounter (HOSPITAL_BASED_OUTPATIENT_CLINIC_OR_DEPARTMENT_OTHER): Payer: Self-pay | Admitting: Urology

## 2017-10-11 NOTE — Anesthesia Postprocedure Evaluation (Signed)
Anesthesia Post Note  Patient: Bradley Morgan  Procedure(s) Performed: CYSTOSCOPY WITH LEFT RETROGRADE PYELOGRAM, BASKETRY OF STONE, URETEROSCOPY AND STENT REMOVAL (Left )     Patient location during evaluation: PACU Anesthesia Type: General Level of consciousness: awake and alert Pain management: pain level controlled Vital Signs Assessment: post-procedure vital signs reviewed and stable Respiratory status: spontaneous breathing, nonlabored ventilation, respiratory function stable and patient connected to nasal cannula oxygen Cardiovascular status: blood pressure returned to baseline and stable Postop Assessment: no apparent nausea or vomiting Anesthetic complications: no    Last Vitals:  Vitals:   10/07/17 1015 10/07/17 1115  BP:  (!) 146/61  Pulse: 73 74  Resp: 11 14  Temp:  36.5 C  SpO2: 99% 98%    Last Pain:  Vitals:   10/10/17 1522  TempSrc:   PainSc: 3                  Tiajuana Amass

## 2018-08-04 ENCOUNTER — Other Ambulatory Visit (HOSPITAL_COMMUNITY): Payer: Self-pay | Admitting: Physician Assistant

## 2018-08-04 DIAGNOSIS — M25562 Pain in left knee: Secondary | ICD-10-CM

## 2018-08-09 ENCOUNTER — Encounter (HOSPITAL_COMMUNITY)
Admission: RE | Admit: 2018-08-09 | Discharge: 2018-08-09 | Disposition: A | Discharge status: Home / Self Care | Payer: Medicare HMO | Source: Ambulatory Visit | Attending: Physician Assistant | Admitting: Physician Assistant

## 2018-08-09 ENCOUNTER — Ambulatory Visit (HOSPITAL_COMMUNITY)
Admission: RE | Admit: 2018-08-09 | Discharge: 2018-08-09 | Disposition: A | Discharge status: Home / Self Care | Payer: Medicare HMO | Source: Ambulatory Visit | Attending: Physician Assistant | Admitting: Physician Assistant

## 2018-08-09 DIAGNOSIS — M25562 Pain in left knee: Secondary | ICD-10-CM

## 2018-08-09 MED ORDER — TECHNETIUM TC 99M MEDRONATE IV KIT
22.0000 | PACK | Freq: Once | INTRAVENOUS | Status: AC | PRN
  Administered 2018-08-09: 22 via INTRAVENOUS

## 2018-09-04 ENCOUNTER — Ambulatory Visit (HOSPITAL_COMMUNITY)
Admission: RE | Admit: 2018-09-04 | Discharge: 2018-09-04 | Disposition: A | Discharge status: Home / Self Care | Payer: Medicare HMO | Source: Ambulatory Visit | Attending: Cardiology | Admitting: Cardiology

## 2018-09-04 ENCOUNTER — Other Ambulatory Visit (HOSPITAL_COMMUNITY): Payer: Self-pay | Admitting: Orthopedic Surgery

## 2018-09-04 DIAGNOSIS — M79662 Pain in left lower leg: Secondary | ICD-10-CM | POA: Insufficient documentation

## 2018-09-04 DIAGNOSIS — M7989 Other specified soft tissue disorders: Secondary | ICD-10-CM | POA: Insufficient documentation

## 2018-09-21 NOTE — H&P (Signed)
TOTAL HIP ADMISSION H&P  Patient is admitted for left total hip arthroplasty.  Subjective:  Chief Complaint: left hip pain  HPI: Bradley Morgan, 68 y.o. male, has a history of pain and functional disability in the left hip(s) due to arthritis and patient has failed non-surgical conservative treatments for greater than 12 weeks to include corticosteriod injections, use of assistive devices and activity modification.  Onset of symptoms was gradual starting several years ago with gradually worsening course since that time.The patient noted prior procedures of the hip to include arthroscopy on the left hip(s).  Patient currently rates pain in the left hip at 9 out of 10 with activity. Patient has worsening of pain with activity and weight bearing, pain that interfers with activities of daily living and instability. Patient has evidence of bone-on-bone arthritis in the central and inferior femoroacetabular joint by imaging studies. This condition presents safety issues increasing the risk of falls. There is no current active infection.  Patient Active Problem List   Diagnosis Date Noted  . Ureteral stone with hydronephrosis 09/24/2017  . 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 Complete rotator cuff tear 07/11/2014  . Acute encephalopathy 04/28/2014  . Numbness and tingling of left side of face 04/27/2014  . Weakness of right hand 04/27/2014  . Drug overdose 04/27/2014  . Obesity, unspecified 12/17/2013  . Essential hypertension, benign 12/17/2013  . Pure hypercholesterolemia 12/17/2013  . Chest pain 12/16/2013   Past Medical History:  Diagnosis Date  . Adrenal adenoma, left   . Arthritis   . Bursitis of elbow   . Complication of anesthesia    ANESTHESIA DID NOT TAKE EFFECT WITH BACK SURGERY "I FELT THEM CUT"  . Congenital absence of right kidney   . Diverticulosis of colon   . Family history of anesthesia complication    "hard time waking up my dad" many yrs ago  . Frequency  of urination   . Full dentures   . GAD (generalized anxiety disorder)   . GERD (gastroesophageal reflux disease)   . History of acute renal failure    12-16-2013;  09-24-2017 due to stone obstruction  . 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 History of encephalopathy 04/26/2014   ACUTE EPISODE SECONDARY TO DRUG OVERDOSE (NARC, BZD, FLEXERIL)  . History of GI bleed    08/ 2001   duodenal ulcer bleed post egd w/ cauterization  . History of prostatitis   . Hyperlipidemia   . Hypertension   . Left ureteral stone   . Nocturia more than twice per night   . Peripheral neuropathy    bilateral leg numbness due to back issues, walks w/ cane  . PONV (postoperative nausea and vomiting)   . Restless leg syndrome   . Solitary left kidney   . Wears glasses     Past Surgical History:  Procedure Laterality Date  . ANTERIOR CERVICAL DECOMP/DISCECTOMY FUSION  05-21-2009   dr Maia Plan [facility]   C5-6, C6-7 and aviator plate  . CARDIOVASCULAR STRESS TEST  12/17/2013   FIXED DEFECT IN THE INFEROSEPTAL REGION BUT NO STRESS INDUCED ISCHEMIA/  NORMAL LV FUNCTION AND WALL MOTION , EF 63%  . CLOSED MANIPULATION POST TOTAL KNEE ARTHROPLASTY Left 11-28-2001    dr Wynelle Link  . CYSTOSCOPY WITH RETROGRADE PYELOGRAM, URETEROSCOPY AND STENT PLACEMENT Left 10/07/2017   Procedure: CYSTOSCOPY WITH LEFT RETROGRADE PYELOGRAM, BASKETRY OF STONE, URETEROSCOPY AND STENT REMOVAL;  Surgeon: Alexis Frock, MD;  Location: [facility];  Service: Urology;  Laterality: Left;  . CYSTOSCOPY WITH STENT PLACEMENT Left 09/24/2017   Procedure: RENAL CYSTOSCOPY LEFT RETROGRADE, LEFT URETERAL STENT PLACEMENT;  Surgeon: Alexis Frock, MD;  Location: [place];  Service: Urology;  Laterality: Left;  . HIP ARTHROSCOPY W/ LABRAL DEBRIDEMENT Left 08/12/2006   dr Wynelle Link [facility]   and chondraplasty  . INCISION AND DRAINAGE ABSCESS Right 03/06/2015   Procedure: INCISION AND  DRAINAGE ABSCESS RIGHT ARM;  Surgeon: Daryll Brod, MD;  Location: [place];  Service: Orthopedics;  Laterality: Right;  . KNEE ARTHROSCOPY Left x2  2002  . LUMBAR SPINE SURGERY  x5  last one 2002  . SHOULDER ARTHROSCOPY WITH SUBACROMIAL DECOMPRESSION, ROTATOR CUFF REPAIR AND BICEP TENDON REPAIR Right 06-27-2002  dr Wynelle Link [facility]   labral debridement and DCR  . SHOULDER OPEN ROTATOR CUFF REPAIR Left 07/11/2014   Procedure: ROTATOR CUFF REPAIR SHOULDER OPEN WITH  GRAFT ;  Surgeon: Tobi Bastos, MD;  Location: [facility];  Service: Orthopedics;  Laterality: Left;  . TOTAL KNEE ARTHROPLASTY Left 10-09-2001   dr Wynelle Link [facility]  . TOTAL KNEE REVISION Left 08/03/2017   Procedure: Left knee tibia polyethylene and patella revision;  Surgeon: Gaynelle Arabian, MD;  Location: [facility];  Service: Orthopedics;  Laterality: Left;  with abductor block    No current facility-administered medications for this encounter.    Current Outpatient Medications  Medication Sig Dispense Refill Last Dose  . aspirin EC 81 MG tablet Take 81 mg by mouth every morning.    10/06/2017 at Unknown time  . carvedilol (COREG) 3.125 MG tablet Take 3.125 mg by mouth every evening.    10/06/2017 at am  . ciprofloxacin (CIPRO) 500 MG tablet Take 500 mg by mouth 2 (two) times daily.   10/06/2017 at Unknown time  . clonazePAM (KLONOPIN) 0.5 MG tablet Take 0.5 mg 3 (three) times daily as needed by mouth for anxiety.    10/06/2017 at Unknown time  . cyclobenzaprine (FLEXERIL) 10 MG tablet Take 1 tablet (10 mg total) 3 (three) times daily as needed by mouth for muscle spasms. 90 tablet 0 10/06/2017 at Unknown time  . gabapentin (NEURONTIN) 300 MG capsule Take 300-600 mg by mouth See admin instructions. Take 300 mg by mouth in the morning, take 300 mg by mouth in the afternoon, take 300 mg by mouth at supper and take 600 mg by mouth at bedtime   10/06/2017 at Unknown time  . lisinopril (PRINIVIL,ZESTRIL) 10 MG tablet Take 10 mg by mouth  at bedtime.    10/06/2017 at Unknown time  . Loratadine-Pseudoephedrine (CLARITIN-D 12 HOUR PO) Take 1 tablet daily as needed by mouth (for allergies).    Past Week at Unknown time  . oxyCODONE-acetaminophen (PERCOCET/ROXICET) 5-325 MG tablet Take 1-2 tablets by mouth every 4 (four) hours as needed for moderate pain or severe pain. Post-operatively. 120 tablet 0   . pantoprazole (PROTONIX) 40 MG tablet Take 40 mg by mouth 2 (two) times daily.    10/06/2017 at Unknown time  . rOPINIRole (REQUIP) 1 MG tablet Take 1 mg by mouth See admin instructions. Take 1 mg by mouth at bedtime. May take additional 1 mg if needed for restless leg   10/06/2017 at Unknown time  . senna-docusate (SENOKOT-S) 8.6-50 MG tablet Take 1 tablet by mouth 2 (two) times daily. While taking strong pain meds to prevent constipation (Patient taking differently: Take 1 tablet by mouth 2 (two)  times daily as needed. While taking strong pain meds to prevent constipation) 20 tablet 0 10/06/2017 at Unknown time  . simvastatin (ZOCOR) 40 MG tablet Take 40 mg by mouth every evening.    10/06/2017 at Unknown time   Allergies  Allergen Reactions  . Morphine And Related Shortness Of Breath  . Aspirin Other (See Comments)    No high doses  . Nsaids Other (See Comments)    Social History   Tobacco Use  . Smoking status: Former Smoker    Years: 15.00    Types: Cigarettes    Last attempt to quit: 10/09/1998    Years since quitting: 19.9  . Smokeless tobacco: Never Used  Substance Use Topics  . Alcohol use: No    Family History  Problem Relation Age of Onset  . Heart failure Father   . Diabetes Father   . Hypertension Father   . Anxiety disorder Mother        also tended to overdose on anxiety medication     Review of Systems  Constitutional: Negative for chills and fever.  HENT: Negative for congestion, sore throat and tinnitus.   Eyes: Negative for double vision, photophobia and pain.  Respiratory: Negative for cough, shortness  of breath and wheezing.   Cardiovascular: Negative for chest pain, palpitations and orthopnea.  Gastrointestinal: Negative for heartburn, nausea and vomiting.  Genitourinary: Negative for dysuria, frequency and urgency.  Musculoskeletal: Positive for joint pain.  Neurological: Negative for dizziness, weakness and headaches.    Objective:  Physical Exam  Well nourished and well developed.  General: Alert and oriented x3, cooperative and pleasant, no acute distress.  Head: normocephalic, atraumatic, neck supple.  Eyes: EOMI.  Respiratory: breath sounds clear in all fields, no wheezing, rales, or rhonchi. Cardiovascular: Regular rate and rhythm, no murmurs, gallops or rubs.  Abdomen: non-tender to palpation and soft, normoactive bowel sounds. Musculoskeletal: Left Hip Exam: ROM: Flexion to 90, Internal Rotation is minimal, External Rotation 10, and Abduction 10-20 degrees. There is pain that radiates down his leg with internal rotation. There is no tenderness over the greater trochanter bursa. There is pain on provocative testing of the hip. Calves soft and nontender. Motor function intact in LE. Strength 5/5 LE bilaterally. Neuro: Distal pulses 2+. Sensation to light touch intact in LE.  Vital signs in last 24 hours: Blood pressure: 130/88 mmHg Pulse: 72 bpm  Labs:   Estimated body mass index is 34.62 kg/m as calculated from the following:   Height as of 10/07/17: 6' (1.829 m).   Weight as of 10/07/17: 115.8 kg.   Imaging Review Plain radiographs demonstrate severe degenerative joint disease of the left hip(s). The bone quality appears to be adequate for age and reported activity level.    Preoperative templating of the joint replacement has been completed, documented, and submitted to the Operating Room personnel in order to optimize intra-operative equipment management.     Assessment/Plan:  End stage arthritis, left hip(s)  The patient history, physical examination,  clinical judgement of the provider and imaging studies are consistent with end stage degenerative joint disease of the left hip(s) and total hip arthroplasty is deemed medically necessary. The treatment options including medical management, injection therapy, arthroscopy and arthroplasty were discussed at length. The risks and benefits of total hip arthroplasty were presented and reviewed. The risks due to aseptic loosening, infection, stiffness, dislocation/subluxation,  thromboembolic complications and other imponderables were discussed.  The patient acknowledged the explanation, agreed to proceed with the plan  and consent was signed. Patient is being admitted for inpatient treatment for surgery, pain control, PT, OT, prophylactic antibiotics, VTE prophylaxis, progressive ambulation and ADL's and discharge planning.The patient is planning to be discharged home.   Therapy Plans: HEP Disposition: Home with wife Planned DVT Prophylaxis: Aspirin 325 mg BID DME needed: None PCP: Kathryne Eriksson, MD TXA: IV Allergies: NKDA Anesthesia Concerns: Difficulty awakening BMI: 37.2 Other: Post-operative pain management will be complicated by chronic opioid use.  - Patient was instructed on what medications to stop prior to surgery. - Follow-up visit in 2 weeks with Dr. Wynelle Link - Begin physical therapy following surgery - Pre-operative lab work as pre-surgical testing - Prescriptions will be provided in hospital at time of discharge  Theresa Duty, PA-C Orthopedic Surgery [facility] [place]

## 2018-09-25 ENCOUNTER — Encounter (HOSPITAL_COMMUNITY): Payer: Self-pay

## 2018-09-25 ENCOUNTER — Encounter (HOSPITAL_COMMUNITY)
Admission: RE | Admit: 2018-09-25 | Discharge: 2018-09-25 | Disposition: A | Discharge status: Home / Self Care | Payer: Medicare HMO | Source: Ambulatory Visit | Attending: Orthopedic Surgery | Admitting: Orthopedic Surgery

## 2018-09-25 ENCOUNTER — Other Ambulatory Visit: Payer: Self-pay

## 2018-09-25 DIAGNOSIS — Z01818 Encounter for other preprocedural examination: Secondary | ICD-10-CM | POA: Insufficient documentation

## 2018-09-25 LAB — COMPREHENSIVE METABOLIC PANEL WITH GFR
ALT: 21 U/L (ref 0–44)
AST: 21 U/L (ref 15–41)
Albumin: 4 g/dL (ref 3.5–5.0)
Alkaline Phosphatase: 78 U/L (ref 38–126)
Anion gap: 7 (ref 5–15)
BUN: 19 mg/dL (ref 8–23)
CO2: 28 mmol/L (ref 22–32)
Calcium: 9.2 mg/dL (ref 8.9–10.3)
Chloride: 106 mmol/L (ref 98–111)
Creatinine, Ser: 0.93 mg/dL (ref 0.61–1.24)
GFR calc Af Amer: >60 mL/min
GFR calc non Af Amer: >60 mL/min
Glucose, Bld: 97 mg/dL (ref 70–99)
Potassium: 4.5 mmol/L (ref 3.5–5.1)
Sodium: 141 mmol/L (ref 135–145)
Total Bilirubin: 0.5 mg/dL (ref 0.3–1.2)
Total Protein: 6.5 g/dL (ref 6.5–8.1)

## 2018-09-25 LAB — CBC
HCT: 38.1 % — ABNORMAL LOW (ref 39.0–52.0)
Hemoglobin: 12 g/dL — ABNORMAL LOW (ref 13.0–17.0)
MCH: 30.9 pg (ref 26.0–34.0)
MCHC: 31.5 g/dL (ref 30.0–36.0)
MCV: 98.2 fL (ref 80.0–100.0)
Platelets: 175 10*3/uL (ref 150–400)
RBC: 3.88 MIL/uL — ABNORMAL LOW (ref 4.22–5.81)
RDW: 13.5 % (ref 11.5–15.5)
WBC: 4.7 10*3/uL (ref 4.0–10.5)
nRBC: 0 % (ref 0.0–0.2)

## 2018-09-25 LAB — SURGICAL PCR SCREEN
MRSA, PCR: NEGATIVE
Staphylococcus aureus: POSITIVE — AB

## 2018-09-25 LAB — APTT: aPTT: 32 seconds (ref 24–36)

## 2018-09-25 LAB — PROTIME-INR
INR: 0.91
Prothrombin Time: 12.2 s (ref 11.4–15.2)

## 2018-09-25 NOTE — Patient Instructions (Addendum)
KORIE BRABSON  09/25/2018   Your procedure is scheduled on: 10-02-18    Report to [facility] Main  Entrance     Report to admitting at 12:30PM    Call this number if you have problems the morning of surgery [phone]     Remember: Do not eat food After Midnight.:YOU [place] MIDNIGHT UNTIL 9:00AM. NOTHING BY MOUTH AFTER 9:00AM!  BRUSH YOUR TEETH MORNING OF SURGERY AND RINSE YOUR MOUTH OUT, NO CHEWING GUM CANDY OR MINTS.      CLEAR LIQUID DIET   Foods Allowed                                                                     Foods Excluded  Coffee and tea, regular and decaf                             liquids that you cannot  Plain Jell-O in any flavor                                             see through such as: Fruit ices (not with fruit pulp)                                     milk, soups, orange juice  Iced Popsicles                                    All solid food Carbonated beverages, regular and diet                                    Cranberry, grape and apple juices Sports drinks like Gatorade Lightly seasoned clear broth or consume(fat free) Sugar, honey syrup  Sample Menu Breakfast                                Lunch                                     Supper Cranberry juice                    Beef broth                            Chicken broth Jell-O                                     Grape juice  Apple juice Coffee or tea                        Jell-O                                      Popsicle                                                Coffee or tea                        Coffee or tea  _____________________________________________________________________       Take these medicines the morning of surgery with A SIP OF WATER: carvedilol, clonazepam, Protonix                                 You may not have any metal on your body including hair pins and   piercings  Do not wear jewelry, make-up, lotions, powders or perfumes, deodorant                        Men may shave face and neck.   Do not bring valuables to the hospital. [place].  Contacts, dentures or bridgework may not be worn into surgery.  Leave suitcase in the car. After surgery it may be brought to your room.                  Please read over the following fact sheets you were given: _____________________________________________________________________             [facility] - Preparing for Surgery Before surgery, you can play an important role.  Because skin is not sterile, your skin needs to be as free of germs as possible.  You can reduce the number of germs on your skin by washing with CHG (chlorahexidine gluconate) soap before surgery.  CHG is an antiseptic cleaner which kills germs and bonds with the skin to continue killing germs even after washing. Please DO NOT use if you have an allergy to CHG or antibacterial soaps.  If your skin becomes reddened/irritated stop using the CHG and inform your nurse when you arrive at Short Stay. Do not shave (including legs and underarms) for at least 48 hours prior to the first CHG shower.  You may shave your face/neck. Please follow these instructions carefully:  1.  Shower with CHG Soap the night before surgery and the  morning of Surgery.  2.  If you choose to wash your hair, wash your hair first as usual with your  normal  shampoo.  3.  After you shampoo, rinse your hair and body thoroughly to remove the  shampoo.                           4.  Use CHG as you would any other liquid soap.  You can apply chg directly  to the skin and wash  Gently with a scrungie or clean washcloth.  5.  Apply the CHG Soap to your body ONLY FROM THE NECK DOWN.   Do not use on face/ open                           Wound or open sores. Avoid contact with eyes, ears mouth and  genitals (private parts).                       Wash face,  Genitals (private parts) with your normal soap.             6.  Wash thoroughly, paying special attention to the area where your surgery  will be performed.  7.  Thoroughly rinse your body with warm water from the neck down.  8.  DO NOT shower/wash with your normal soap after using and rinsing off  the CHG Soap.                9.  Pat yourself dry with a clean towel.            10.  Wear clean pajamas.            11.  Place clean sheets on your bed the night of your first shower and do not  sleep with pets. Day of Surgery : Do not apply any lotions/deodorants the morning of surgery.  Please wear clean clothes to the hospital/surgery center.  FAILURE TO FOLLOW THESE INSTRUCTIONS MAY RESULT IN THE CANCELLATION OF YOUR SURGERY PATIENT SIGNATURE_________________________________  NURSE SIGNATURE__________________________________  ________________________________________________________________________   [name]  An incentive spirometer is a tool that can help keep your lungs clear and active. This tool measures how well you are filling your lungs with each breath. Taking long deep breaths may help reverse or decrease the chance of developing breathing (pulmonary) problems (especially infection) following:  A long period of time when you are unable to move or be active. BEFORE THE PROCEDURE   If the spirometer includes an indicator to show your best effort, your nurse or respiratory therapist will set it to a desired goal.  If possible, sit up straight or lean slightly forward. Try not to slouch.  Hold the incentive spirometer in an upright position. INSTRUCTIONS FOR USE  1. Sit on the edge of your bed if possible, or sit up as far as you can in bed or on a chair. 2. Hold the incentive spirometer in an upright position. 3. Breathe out normally. 4. Place the mouthpiece in your mouth and seal your lips tightly around  it. 5. Breathe in slowly and as deeply as possible, raising the piston or the ball toward the top of the column. 6. Hold your breath for 3-5 seconds or for as long as possible. Allow the piston or ball to fall to the bottom of the column. 7. Remove the mouthpiece from your mouth and breathe out normally. 8. Rest for a few seconds and repeat Steps 1 through 7 at least 10 times every 1-2 hours when you are awake. Take your time and take a few normal breaths between deep breaths. 9. The spirometer may include an indicator to show your best effort. Use the indicator as a goal to work toward during each repetition. 10. After each set of 10 deep breaths, practice coughing to be sure your lungs are clear. If you have an incision (the cut made at the time of  surgery), support your incision when coughing by placing a pillow or rolled up towels firmly against it. Once you are able to get out of bed, walk around indoors and cough well. You may stop using the incentive spirometer when instructed by your caregiver.  RISKS AND COMPLICATIONS  Take your time so you do not get dizzy or light-headed.  If you are in pain, you may need to take or ask for pain medication before doing incentive spirometry. It is harder to take a deep breath if you are having pain. AFTER USE  Rest and breathe slowly and easily.  It can be helpful to keep track of a log of your progress. Your caregiver can provide you with a simple table to help with this. If you are using the spirometer at home, follow these instructions: [place] IF:   You are having difficultly using the spirometer.  You have trouble using the spirometer as often as instructed.  Your pain medication is not giving enough relief while using the spirometer.  You develop fever of 100.5 F (38.1 C) or higher. SEEK IMMEDIATE MEDICAL CARE IF:   You cough up bloody sputum that had not been present before.  You develop fever of 102 F (38.9 C) or  greater.  You develop worsening pain at or near the incision site. MAKE SURE YOU:   Understand these instructions.  Will watch your condition.  Will get help right away if you are not doing well or get worse. Document Released: 01/17/2007 Document Revised: 11/29/2011 Document Reviewed: 03/20/2007 Exit[employer] Patient Information 2014 [employer], [place].   ________________________________________________________________________  WHAT IS A BLOOD TRANSFUSION? Blood Transfusion Information  A transfusion is the replacement of blood or some of its parts. Blood is made up of multiple cells which provide different functions.  Red blood cells carry oxygen and are used for blood loss replacement.  White blood cells fight against infection.  Platelets control bleeding.  Plasma helps clot blood.  Other blood products are available for specialized needs, such as hemophilia or other clotting disorders. BEFORE THE TRANSFUSION  Who gives blood for transfusions?   Healthy volunteers who are fully evaluated to make sure their blood is safe. This is blood bank blood. Transfusion therapy is the safest it has ever been in the practice of medicine. Before blood is taken from a donor, a complete history is taken to make sure that person has no history of diseases nor engages in risky social behavior (examples are intravenous drug use or sexual activity with multiple partners). The donor's travel history is screened to minimize risk of transmitting infections, such as malaria. The donated blood is tested for signs of infectious diseases, such as HIV and hepatitis. The blood is then tested to be sure it is compatible with you in order to minimize the chance of a transfusion reaction. If you or a relative donates blood, this is often done in anticipation of surgery and is not appropriate for emergency situations. It takes many days to process the donated blood. RISKS AND COMPLICATIONS Although transfusion therapy  is very safe and saves many lives, the main dangers of transfusion include:   Getting an infectious disease.  Developing a transfusion reaction. This is an allergic reaction to something in the blood you were given. Every precaution is taken to prevent this. The decision to have a blood transfusion has been considered carefully by your caregiver before blood is given. Blood is not given unless the benefits outweigh the risks. AFTER THE  TRANSFUSION  Right after receiving a blood transfusion, you will usually feel much better and more energetic. This is especially true if your red blood cells have gotten low (anemic). The transfusion raises the level of the red blood cells which carry oxygen, and this usually causes an energy increase.  The nurse administering the transfusion will monitor you carefully for complications. HOME CARE INSTRUCTIONS  No special instructions are needed after a transfusion. You may find your energy is better. Speak with your caregiver about any limitations on activity for underlying diseases you may have. SEEK MEDICAL CARE IF:   Your condition is not improving after your transfusion.  You develop redness or irritation at the intravenous (IV) site. SEEK IMMEDIATE MEDICAL CARE IF:  Any of the following symptoms occur over the next 12 hours:  Shaking chills.  You have a temperature by mouth above 102 F (38.9 C), not controlled by medicine.  Chest, back, or muscle pain.  People around you feel you are not acting correctly or are confused.  Shortness of breath or difficulty breathing.  Dizziness and fainting.  You get a rash or develop hives.  You have a decrease in urine output.  Your urine turns a dark color or changes to pink, red, or brown. Any of the following symptoms occur over the next 10 days:  You have a temperature by mouth above 102 F (38.9 C), not controlled by medicine.  Shortness of breath.  Weakness after normal activity.  The white  part of the eye turns yellow (jaundice).  You have a decrease in the amount of urine or are urinating less often.  Your urine turns a dark color or changes to pink, red, or brown. Document Released: 09/03/2000 Document Revised: 11/29/2011 Document Reviewed: 04/22/2008 [facility] Patient Information 2014 [place], [place].  _______________________________________________________________________

## 2018-09-25 NOTE — Progress Notes (Signed)
ekg 09-24-17 epic

## 2018-09-26 NOTE — Progress Notes (Addendum)
Final EKG reviewed with Janett Billow, [place]. No concerns regarding EKG.  However, PA requested that nurse contact Dr. Anne Fu office to see if patient has obtained surgical clearance.. Voice message left for [employer].Marland KitchenMarland KitchenMarland KitchenAwaiting return call and/or faxed surgical clearance

## 2018-09-28 NOTE — Progress Notes (Signed)
RN LVMM for [place] Chard at [facility] office to f/u on previous request for surgical clearance. RN awaiting return call and/or faxed surgical clearance

## 2018-09-29 NOTE — Progress Notes (Signed)
Pt aware to arrive at [facility] admitting on 10/02/2018 at 1100 am for scheduled surgery.  Pt verbalized understanding of no food or drink after midnight.

## 2018-10-01 MED ORDER — DEXTROSE 5 % IV SOLN
3.0000 g | INTRAVENOUS | Status: AC
  Administered 2018-10-02: 3 g via INTRAVENOUS
  Filled 2018-10-01: qty 3

## 2018-10-02 ENCOUNTER — Inpatient Hospital Stay (HOSPITAL_COMMUNITY): Payer: Medicare HMO | Admitting: Anesthesiology

## 2018-10-02 ENCOUNTER — Inpatient Hospital Stay (HOSPITAL_COMMUNITY): Payer: Medicare HMO | Admitting: Physician Assistant

## 2018-10-02 ENCOUNTER — Inpatient Hospital Stay (HOSPITAL_COMMUNITY): Payer: Medicare HMO

## 2018-10-02 ENCOUNTER — Encounter (HOSPITAL_COMMUNITY): Payer: Self-pay | Admitting: Anesthesiology

## 2018-10-02 ENCOUNTER — Other Ambulatory Visit: Payer: Self-pay

## 2018-10-02 ENCOUNTER — Inpatient Hospital Stay (HOSPITAL_COMMUNITY)
Admission: RE | Admit: 2018-10-02 | Discharge: 2018-10-04 | DRG: 470 | Disposition: A | Discharge status: Home Health | Payer: Medicare HMO | Attending: Orthopedic Surgery | Admitting: Orthopedic Surgery

## 2018-10-02 ENCOUNTER — Encounter (HOSPITAL_COMMUNITY)
Admission: RE | Disposition: A | Discharge status: Home Health | Payer: Self-pay | Source: Home / Self Care | Attending: Orthopedic Surgery

## 2018-10-02 DIAGNOSIS — M1612 Unilateral primary osteoarthritis, left hip: Secondary | ICD-10-CM | POA: Diagnosis present

## 2018-10-02 DIAGNOSIS — Z886 Allergy status to analgesic agent status: Secondary | ICD-10-CM

## 2018-10-02 DIAGNOSIS — Z7982 Long term (current) use of aspirin: Secondary | ICD-10-CM

## 2018-10-02 DIAGNOSIS — Z6836 Body mass index (BMI) 36.0-36.9, adult: Secondary | ICD-10-CM | POA: Diagnosis not present

## 2018-10-02 DIAGNOSIS — Z833 Family history of diabetes mellitus: Secondary | ICD-10-CM | POA: Diagnosis not present

## 2018-10-02 DIAGNOSIS — Z96652 Presence of left artificial knee joint: Secondary | ICD-10-CM | POA: Diagnosis present

## 2018-10-02 DIAGNOSIS — I10 Essential (primary) hypertension: Secondary | ICD-10-CM | POA: Diagnosis present

## 2018-10-02 DIAGNOSIS — G629 Polyneuropathy, unspecified: Secondary | ICD-10-CM | POA: Diagnosis present

## 2018-10-02 DIAGNOSIS — Z87891 Personal history of nicotine dependence: Secondary | ICD-10-CM

## 2018-10-02 DIAGNOSIS — E669 Obesity, unspecified: Secondary | ICD-10-CM | POA: Diagnosis present

## 2018-10-02 DIAGNOSIS — E785 Hyperlipidemia, unspecified: Secondary | ICD-10-CM | POA: Diagnosis present

## 2018-10-02 DIAGNOSIS — M25552 Pain in left hip: Secondary | ICD-10-CM | POA: Diagnosis present

## 2018-10-02 DIAGNOSIS — Z96649 Presence of unspecified artificial hip joint: Secondary | ICD-10-CM

## 2018-10-02 DIAGNOSIS — K5730 Diverticulosis of large intestine without perforation or abscess without bleeding: Secondary | ICD-10-CM | POA: Diagnosis present

## 2018-10-02 DIAGNOSIS — Z885 Allergy status to narcotic agent status: Secondary | ICD-10-CM

## 2018-10-02 DIAGNOSIS — E7800 Pure hypercholesterolemia, unspecified: Secondary | ICD-10-CM | POA: Diagnosis present

## 2018-10-02 DIAGNOSIS — Z8249 Family history of ischemic heart disease and other diseases of the circulatory system: Secondary | ICD-10-CM | POA: Diagnosis not present

## 2018-10-02 DIAGNOSIS — Z818 Family history of other mental and behavioral disorders: Secondary | ICD-10-CM | POA: Diagnosis not present

## 2018-10-02 DIAGNOSIS — G2581 Restless legs syndrome: Secondary | ICD-10-CM | POA: Diagnosis present

## 2018-10-02 DIAGNOSIS — Z87442 Personal history of urinary calculi: Secondary | ICD-10-CM

## 2018-10-02 DIAGNOSIS — M169 Osteoarthritis of hip, unspecified: Secondary | ICD-10-CM | POA: Diagnosis present

## 2018-10-02 DIAGNOSIS — Z981 Arthrodesis status: Secondary | ICD-10-CM | POA: Diagnosis not present

## 2018-10-02 DIAGNOSIS — K219 Gastro-esophageal reflux disease without esophagitis: Secondary | ICD-10-CM | POA: Diagnosis present

## 2018-10-02 DIAGNOSIS — Q600 Renal agenesis, unilateral: Secondary | ICD-10-CM | POA: Diagnosis not present

## 2018-10-02 DIAGNOSIS — Z419 Encounter for procedure for purposes other than remedying health state, unspecified: Secondary | ICD-10-CM

## 2018-10-02 HISTORY — PX: TOTAL HIP ARTHROPLASTY: SHX124

## 2018-10-02 LAB — POCT I-STAT 4, (NA,K, GLUC, HGB,HCT)
GLUCOSE: 124 mg/dL — AB (ref 70–99)
HCT: 28 % — ABNORMAL LOW (ref 39.0–52.0)
Hemoglobin: 9.5 g/dL — ABNORMAL LOW (ref 13.0–17.0)
Potassium: 4.1 mmol/L (ref 3.5–5.1)
Sodium: 140 mmol/L (ref 135–145)

## 2018-10-02 LAB — PREPARE RBC (CROSSMATCH)

## 2018-10-02 SURGERY — ARTHROPLASTY, HIP, TOTAL, ANTERIOR APPROACH
Anesthesia: General | Site: Hip | Laterality: Left

## 2018-10-02 MED ORDER — HYDROCODONE-ACETAMINOPHEN 5-325 MG PO TABS
1.0000 | ORAL_TABLET | ORAL | Status: DC | PRN
  Administered 2018-10-02 – 2018-10-03 (×2): 2 via ORAL
  Filled 2018-10-02 (×2): qty 2

## 2018-10-02 MED ORDER — SODIUM CHLORIDE 0.9 % IV SOLN
INTRAVENOUS | Status: DC
  Administered 2018-10-02: 18:00:00 via INTRAVENOUS

## 2018-10-02 MED ORDER — STERILE WATER FOR IRRIGATION IR SOLN
Status: DC | PRN
  Administered 2018-10-02: 2000 mL

## 2018-10-02 MED ORDER — ACETAMINOPHEN 500 MG PO TABS
500.0000 mg | ORAL_TABLET | Freq: Four times a day (QID) | ORAL | Status: AC
  Administered 2018-10-02: 500 mg via ORAL
  Filled 2018-10-02: qty 1

## 2018-10-02 MED ORDER — FENTANYL CITRATE (PF) 100 MCG/2ML IJ SOLN
INTRAMUSCULAR | Status: AC
  Filled 2018-10-02: qty 2

## 2018-10-02 MED ORDER — HYDROMORPHONE HCL 1 MG/ML IJ SOLN
INTRAMUSCULAR | Status: AC
  Administered 2018-10-02: 0.25 mg via INTRAVENOUS
  Filled 2018-10-02: qty 1

## 2018-10-02 MED ORDER — TRANEXAMIC ACID-NACL 1000-0.7 MG/100ML-% IV SOLN
1000.0000 mg | Freq: Once | INTRAVENOUS | Status: AC
  Administered 2018-10-02: 1000 mg via INTRAVENOUS
  Filled 2018-10-02: qty 100

## 2018-10-02 MED ORDER — METOCLOPRAMIDE HCL 5 MG PO TABS: 5.0000 mg | ORAL_TABLET | Freq: Three times a day (TID) | ORAL | Status: DC | PRN

## 2018-10-02 MED ORDER — PANTOPRAZOLE SODIUM 40 MG PO TBEC
40.0000 mg | DELAYED_RELEASE_TABLET | Freq: Every day | ORAL | Status: DC
  Administered 2018-10-03 – 2018-10-04 (×2): 40 mg via ORAL
  Filled 2018-10-02 (×2): qty 1

## 2018-10-02 MED ORDER — BISACODYL 10 MG RE SUPP: 10.0000 mg | Freq: Every day | RECTAL | Status: DC | PRN

## 2018-10-02 MED ORDER — CLONAZEPAM 0.5 MG PO TABS
0.5000 mg | ORAL_TABLET | Freq: Three times a day (TID) | ORAL | Status: DC
  Administered 2018-10-02 – 2018-10-04 (×7): 0.5 mg via ORAL
  Filled 2018-10-02 (×7): qty 1

## 2018-10-02 MED ORDER — HYDROCODONE-ACETAMINOPHEN 7.5-325 MG PO TABS
1.0000 | ORAL_TABLET | ORAL | Status: DC | PRN
  Administered 2018-10-02: 1 via ORAL
  Administered 2018-10-03: 2 via ORAL
  Administered 2018-10-03: 1 via ORAL
  Administered 2018-10-03 – 2018-10-04 (×6): 2 via ORAL
  Filled 2018-10-02: qty 1
  Filled 2018-10-02 (×3): qty 2
  Filled 2018-10-02: qty 1
  Filled 2018-10-02 (×4): qty 2

## 2018-10-02 MED ORDER — ALBUMIN HUMAN 5 % IV SOLN
INTRAVENOUS | Status: DC | PRN
  Administered 2018-10-02 (×2): via INTRAVENOUS

## 2018-10-02 MED ORDER — ALBUMIN HUMAN 5 % IV SOLN
INTRAVENOUS | Status: AC
  Filled 2018-10-02: qty 250

## 2018-10-02 MED ORDER — ROCURONIUM BROMIDE 10 MG/ML (PF) SYRINGE
PREFILLED_SYRINGE | INTRAVENOUS | Status: DC | PRN
  Administered 2018-10-02: 10 mg via INTRAVENOUS
  Administered 2018-10-02: 40 mg via INTRAVENOUS
  Administered 2018-10-02: 10 mg via INTRAVENOUS
  Administered 2018-10-02: 20 mg via INTRAVENOUS
  Administered 2018-10-02 (×2): 10 mg via INTRAVENOUS

## 2018-10-02 MED ORDER — METHOCARBAMOL 500 MG IVPB - SIMPLE MED
500.0000 mg | Freq: Four times a day (QID) | INTRAVENOUS | Status: DC | PRN
  Administered 2018-10-02: 500 mg via INTRAVENOUS
  Filled 2018-10-02: qty 50

## 2018-10-02 MED ORDER — PROPOFOL 10 MG/ML IV BOLUS
INTRAVENOUS | Status: DC | PRN
  Administered 2018-10-02: 140 mg via INTRAVENOUS

## 2018-10-02 MED ORDER — SUGAMMADEX SODIUM 500 MG/5ML IV SOLN
INTRAVENOUS | Status: AC
  Filled 2018-10-02: qty 5

## 2018-10-02 MED ORDER — CEFAZOLIN SODIUM-DEXTROSE 2-4 GM/100ML-% IV SOLN
2.0000 g | Freq: Four times a day (QID) | INTRAVENOUS | Status: AC
  Administered 2018-10-02 – 2018-10-03 (×2): 2 g via INTRAVENOUS
  Filled 2018-10-02 (×2): qty 100

## 2018-10-02 MED ORDER — SUCCINYLCHOLINE CHLORIDE 200 MG/10ML IV SOSY
PREFILLED_SYRINGE | INTRAVENOUS | Status: AC
  Filled 2018-10-02: qty 10

## 2018-10-02 MED ORDER — PHENYLEPHRINE 40 MCG/ML (10ML) SYRINGE FOR IV PUSH (FOR BLOOD PRESSURE SUPPORT)
PREFILLED_SYRINGE | INTRAVENOUS | Status: AC
  Filled 2018-10-02: qty 10

## 2018-10-02 MED ORDER — MENTHOL 3 MG MT LOZG: 1.0000 | LOZENGE | OROMUCOSAL | Status: DC | PRN

## 2018-10-02 MED ORDER — ONDANSETRON HCL 4 MG/2ML IJ SOLN
INTRAMUSCULAR | Status: DC | PRN
  Administered 2018-10-02: 4 mg via INTRAVENOUS

## 2018-10-02 MED ORDER — CARVEDILOL 3.125 MG PO TABS
3.1250 mg | ORAL_TABLET | Freq: Two times a day (BID) | ORAL | Status: DC
  Administered 2018-10-02 – 2018-10-04 (×4): 3.125 mg via ORAL
  Filled 2018-10-02 (×4): qty 1

## 2018-10-02 MED ORDER — FUROSEMIDE 20 MG PO TABS
20.0000 mg | ORAL_TABLET | Freq: Every day | ORAL | Status: DC
  Administered 2018-10-03 – 2018-10-04 (×2): 20 mg via ORAL
  Filled 2018-10-02 (×2): qty 1

## 2018-10-02 MED ORDER — ONDANSETRON HCL 4 MG/2ML IJ SOLN: 4.0000 mg | Freq: Four times a day (QID) | INTRAMUSCULAR | Status: DC | PRN

## 2018-10-02 MED ORDER — ONDANSETRON HCL 4 MG PO TABS: 4.0000 mg | ORAL_TABLET | Freq: Four times a day (QID) | ORAL | Status: DC | PRN

## 2018-10-02 MED ORDER — GABAPENTIN 300 MG PO CAPS
300.0000 mg | ORAL_CAPSULE | Freq: Three times a day (TID) | ORAL | Status: DC
  Administered 2018-10-02 – 2018-10-04 (×7): 300 mg via ORAL
  Filled 2018-10-02 (×7): qty 1

## 2018-10-02 MED ORDER — MIDAZOLAM HCL 5 MG/5ML IJ SOLN
INTRAMUSCULAR | Status: DC | PRN
Start: 2018-10-02 — End: 2018-10-02
  Administered 2018-10-02: 1 mg via INTRAVENOUS

## 2018-10-02 MED ORDER — SODIUM CHLORIDE 0.9% IV SOLUTION: Freq: Once | INTRAVENOUS | Status: DC

## 2018-10-02 MED ORDER — METHOCARBAMOL 500 MG IVPB - SIMPLE MED
INTRAVENOUS | Status: AC
  Filled 2018-10-02: qty 50

## 2018-10-02 MED ORDER — DOCUSATE SODIUM 100 MG PO CAPS
100.0000 mg | ORAL_CAPSULE | Freq: Two times a day (BID) | ORAL | Status: DC
  Administered 2018-10-02 – 2018-10-04 (×3): 100 mg via ORAL
  Filled 2018-10-02 (×4): qty 1

## 2018-10-02 MED ORDER — RIVAROXABAN 10 MG PO TABS
10.0000 mg | ORAL_TABLET | Freq: Every day | ORAL | Status: DC
  Administered 2018-10-03 – 2018-10-04 (×2): 10 mg via ORAL
  Filled 2018-10-02 (×2): qty 1

## 2018-10-02 MED ORDER — ROCURONIUM BROMIDE 10 MG/ML (PF) SYRINGE
PREFILLED_SYRINGE | INTRAVENOUS | Status: AC
  Filled 2018-10-02: qty 20

## 2018-10-02 MED ORDER — HYDROMORPHONE HCL 1 MG/ML IJ SOLN
INTRAMUSCULAR | Status: AC
  Administered 2018-10-02: 0.5 mg via INTRAVENOUS
  Filled 2018-10-02: qty 1

## 2018-10-02 MED ORDER — CHLORHEXIDINE GLUCONATE 4 % EX LIQD: 60.0000 mL | Freq: Once | CUTANEOUS | Status: DC

## 2018-10-02 MED ORDER — ACETAMINOPHEN 10 MG/ML IV SOLN
1000.0000 mg | Freq: Four times a day (QID) | INTRAVENOUS | Status: DC
  Administered 2018-10-02: 1000 mg via INTRAVENOUS
  Filled 2018-10-02: qty 100

## 2018-10-02 MED ORDER — CYCLOBENZAPRINE HCL 10 MG PO TABS: 10.0000 mg | ORAL_TABLET | Freq: Three times a day (TID) | ORAL | Status: DC | PRN

## 2018-10-02 MED ORDER — 0.9 % SODIUM CHLORIDE (POUR BTL) OPTIME
TOPICAL | Status: DC | PRN
  Administered 2018-10-02: 1000 mL

## 2018-10-02 MED ORDER — BUPIVACAINE-EPINEPHRINE (PF) 0.25% -1:200000 IJ SOLN
INTRAMUSCULAR | Status: DC | PRN
  Administered 2018-10-02: 30 mL

## 2018-10-02 MED ORDER — FLEET ENEMA 7-19 GM/118ML RE ENEM: 1.0000 | ENEMA | Freq: Once | RECTAL | Status: DC | PRN

## 2018-10-02 MED ORDER — MIDAZOLAM HCL 2 MG/2ML IJ SOLN
INTRAMUSCULAR | Status: AC
  Filled 2018-10-02: qty 2

## 2018-10-02 MED ORDER — SUCCINYLCHOLINE CHLORIDE 200 MG/10ML IV SOSY
PREFILLED_SYRINGE | INTRAVENOUS | Status: DC | PRN
  Administered 2018-10-02: 160 mg via INTRAVENOUS

## 2018-10-02 MED ORDER — PHENOL 1.4 % MT LIQD: 1.0000 | OROMUCOSAL | Status: DC | PRN

## 2018-10-02 MED ORDER — DEXAMETHASONE SODIUM PHOSPHATE 10 MG/ML IJ SOLN
10.0000 mg | Freq: Once | INTRAMUSCULAR | Status: AC
  Administered 2018-10-03: 10 mg via INTRAVENOUS
  Filled 2018-10-02: qty 1

## 2018-10-02 MED ORDER — HYDROMORPHONE HCL 1 MG/ML IJ SOLN
0.2500 mg | INTRAMUSCULAR | Status: DC | PRN
Start: 2018-10-02 — End: 2018-10-02
  Administered 2018-10-02: 0.25 mg via INTRAVENOUS
  Administered 2018-10-02 (×3): 0.5 mg via INTRAVENOUS
  Administered 2018-10-02: 0.25 mg via INTRAVENOUS

## 2018-10-02 MED ORDER — BUPIVACAINE-EPINEPHRINE (PF) 0.25% -1:200000 IJ SOLN
INTRAMUSCULAR | Status: AC
  Filled 2018-10-02: qty 30

## 2018-10-02 MED ORDER — LACTATED RINGERS IV SOLN
INTRAVENOUS | Status: DC
  Administered 2018-10-02 (×3): via INTRAVENOUS

## 2018-10-02 MED ORDER — METOCLOPRAMIDE HCL 5 MG/ML IJ SOLN: 5.0000 mg | Freq: Three times a day (TID) | INTRAMUSCULAR | Status: DC | PRN

## 2018-10-02 MED ORDER — LIDOCAINE 2% (20 MG/ML) 5 ML SYRINGE
INTRAMUSCULAR | Status: AC
  Filled 2018-10-02: qty 10

## 2018-10-02 MED ORDER — ROPINIROLE HCL 1 MG PO TABS
1.0000 mg | ORAL_TABLET | Freq: Every evening | ORAL | Status: DC | PRN
  Administered 2018-10-02 – 2018-10-03 (×3): 1 mg via ORAL
  Filled 2018-10-02 (×3): qty 1

## 2018-10-02 MED ORDER — DIPHENHYDRAMINE HCL 12.5 MG/5ML PO ELIX: 12.5000 mg | ORAL_SOLUTION | ORAL | Status: DC | PRN

## 2018-10-02 MED ORDER — METHOCARBAMOL 500 MG PO TABS
500.0000 mg | ORAL_TABLET | Freq: Four times a day (QID) | ORAL | Status: DC | PRN
  Administered 2018-10-02 – 2018-10-03 (×2): 500 mg via ORAL
  Filled 2018-10-02 (×2): qty 1

## 2018-10-02 MED ORDER — FENTANYL CITRATE (PF) 100 MCG/2ML IJ SOLN
INTRAMUSCULAR | Status: DC | PRN
  Administered 2018-10-02 (×2): 50 ug via INTRAVENOUS
  Administered 2018-10-02 (×2): 25 ug via INTRAVENOUS
  Administered 2018-10-02 (×3): 50 ug via INTRAVENOUS

## 2018-10-02 MED ORDER — TRANEXAMIC ACID-NACL 1000-0.7 MG/100ML-% IV SOLN
1000.0000 mg | INTRAVENOUS | Status: AC
  Administered 2018-10-02: 1000 mg via INTRAVENOUS
  Filled 2018-10-02: qty 100

## 2018-10-02 MED ORDER — LIDOCAINE 2% (20 MG/ML) 5 ML SYRINGE
INTRAMUSCULAR | Status: DC | PRN
  Administered 2018-10-02: 100 mg via INTRAVENOUS

## 2018-10-02 MED ORDER — SIMVASTATIN 40 MG PO TABS
40.0000 mg | ORAL_TABLET | Freq: Every evening | ORAL | Status: DC
  Administered 2018-10-02 – 2018-10-03 (×2): 40 mg via ORAL
  Filled 2018-10-02 (×2): qty 1

## 2018-10-02 MED ORDER — POLYETHYLENE GLYCOL 3350 17 G PO PACK: 17.0000 g | PACK | Freq: Every day | ORAL | Status: DC | PRN

## 2018-10-02 MED ORDER — SUGAMMADEX SODIUM 500 MG/5ML IV SOLN
INTRAVENOUS | Status: DC | PRN
  Administered 2018-10-02: 500 mg via INTRAVENOUS

## 2018-10-02 MED ORDER — DEXAMETHASONE SODIUM PHOSPHATE 10 MG/ML IJ SOLN
8.0000 mg | Freq: Once | INTRAMUSCULAR | Status: AC
  Administered 2018-10-02: 8 mg via INTRAVENOUS

## 2018-10-02 SURGICAL SUPPLY — 44 items
BAG DECANTER FOR FLEXI CONT (MISCELLANEOUS) ×2 IMPLANT
BAG SPEC THK2 15X12 ZIP CLS (MISCELLANEOUS) ×1
BAG ZIPLOCK 12X15 (MISCELLANEOUS) ×1 IMPLANT
BLADE SAG 18X100X1.27 (BLADE) ×2 IMPLANT
BLADE SURG SZ10 CARB STEEL (BLADE) ×4 IMPLANT
COVER PERINEAL POST (MISCELLANEOUS) ×2 IMPLANT
COVER SURGICAL LIGHT HANDLE (MISCELLANEOUS) ×2 IMPLANT
CUP ACETBLR 54 OD PINNACLE (Hips) ×1 IMPLANT
DECANTER SPIKE VIAL GLASS SM (MISCELLANEOUS) ×2 IMPLANT
DRAPE STERI IOBAN 125X83 (DRAPES) ×2 IMPLANT
DRAPE U-SHAPE 47X51 STRL (DRAPES) ×4 IMPLANT
DRSG ADAPTIC 3X8 NADH LF (GAUZE/BANDAGES/DRESSINGS) ×2 IMPLANT
DRSG MEPILEX BORDER 4X4 (GAUZE/BANDAGES/DRESSINGS) ×2 IMPLANT
DRSG MEPILEX BORDER 4X8 (GAUZE/BANDAGES/DRESSINGS) ×2 IMPLANT
DURAPREP 26ML APPLICATOR (WOUND CARE) ×2 IMPLANT
ELECT REM PT RETURN 15FT ADLT (MISCELLANEOUS) ×2 IMPLANT
EVACUATOR 1/8 PVC DRAIN (DRAIN) ×2 IMPLANT
GLOVE BIO SURGEON STRL SZ7 (GLOVE) ×2 IMPLANT
GLOVE BIO SURGEON STRL SZ8 (GLOVE) ×2 IMPLANT
GLOVE BIOGEL PI IND STRL 7.0 (GLOVE) ×1 IMPLANT
GLOVE BIOGEL PI IND STRL 8 (GLOVE) ×1 IMPLANT
GLOVE BIOGEL PI INDICATOR 7.0 (GLOVE) ×1
GLOVE BIOGEL PI INDICATOR 8 (GLOVE) ×1
GOWN STRL REUS W/TWL LRG LVL3 (GOWN DISPOSABLE) ×2 IMPLANT
GOWN STRL REUS W/TWL XL LVL3 (GOWN DISPOSABLE) ×2 IMPLANT
HEAD CERAMIC 36 PLUS5 (Hips) ×1 IMPLANT
HOLDER FOLEY CATH W/STRAP (MISCELLANEOUS) ×2 IMPLANT
LINER MARATHON NEUT +4X54X36 (Hips) ×1 IMPLANT
MANIFOLD NEPTUNE II (INSTRUMENTS) ×2 IMPLANT
NDL SAFETY ECLIPSE 18X1.5 (NEEDLE) IMPLANT
NEEDLE HYPO 18GX1.5 SHARP (NEEDLE) ×2
PACK ANTERIOR HIP CUSTOM (KITS) ×2 IMPLANT
STEM FEM ACTIS HIGH SZ8 (Stem) ×1 IMPLANT
STEM FEM ACTIS STD SZ7 (Nail) ×1 IMPLANT
STRIP CLOSURE SKIN 1/2X4 (GAUZE/BANDAGES/DRESSINGS) ×3 IMPLANT
SUT ETHIBOND NAB CT1 #1 30IN (SUTURE) ×2 IMPLANT
SUT MNCRL AB 4-0 PS2 18 (SUTURE) ×2 IMPLANT
SUT STRATAFIX 0 PDS 27 VIOLET (SUTURE) ×2
SUT VIC AB 2-0 CT1 27 (SUTURE) ×4
SUT VIC AB 2-0 CT1 TAPERPNT 27 (SUTURE) ×2 IMPLANT
SUTURE STRATFX 0 PDS 27 VIOLET (SUTURE) ×1 IMPLANT
SYR 50ML LL SCALE MARK (SYRINGE) ×1 IMPLANT
TRAY FOLEY MTR SLVR 16FR STAT (SET/KITS/TRAYS/PACK) ×2 IMPLANT
YANKAUER SUCT BULB TIP 10FT TU (MISCELLANEOUS) ×2 IMPLANT

## 2018-10-02 NOTE — Discharge Instructions (Addendum)
°Dr. Frank Aluisio °Total Joint Specialist °[facility] °[address] °[place], [place] [address] °[phone] ° °ANTERIOR APPROACH TOTAL HIP REPLACEMENT POSTOPERATIVE DIRECTIONS ° ° °Hip Rehabilitation, Guidelines Following Surgery  °The results of a hip operation are greatly improved after range of motion and muscle strengthening exercises. Follow all safety measures which are given to protect your hip. If any of these exercises cause increased pain or swelling in your joint, decrease the amount until you are comfortable again. Then slowly increase the exercises. Call your caregiver if you have problems or questions.  ° °HOME CARE INSTRUCTIONS  °• Remove items at home which could result in a fall. This includes throw rugs or furniture in walking pathways.  °· ICE to the affected hip every three hours for 30 minutes at a time and then as needed for pain and swelling.  Continue to use ice on the hip for pain and swelling from surgery. You may notice swelling that will progress down to the foot and ankle.  This is normal after surgery.  Elevate the leg when you are not up walking on it.   °· Continue to use the breathing machine which will help keep your temperature down.  It is common for your temperature to cycle up and down following surgery, especially at night when you are not up moving around and exerting yourself.  The breathing machine keeps your lungs expanded and your temperature down. ° °DIET °You may resume your previous home diet once your are discharged from the hospital. ° °DRESSING / WOUND CARE / SHOWERING °You may shower 3 days after surgery, but keep the wounds dry during showering.  You may use an occlusive plastic wrap (Press'n Seal for example), NO SOAKING/SUBMERGING IN THE BATHTUB.  If the bandage gets wet, change with a clean dry gauze.  If the incision gets wet, pat the wound dry with a clean towel. °You may start showering once you are discharged home but do not submerge the  incision under water. Just pat the incision dry and apply a dry gauze dressing on daily. °Change the surgical dressing daily and reapply a dry dressing each time. ° °ACTIVITY °Walk with your walker as instructed. °Use walker as long as suggested by your caregivers. °Avoid periods of inactivity such as sitting longer than an hour when not asleep. This helps prevent blood clots.  °You may resume a sexual relationship in one month or when given the OK by your doctor.  °You may return to work once you are cleared by your doctor.  °Do not drive a car for 6 weeks or until released by you surgeon.  °Do not drive while taking narcotics. ° °WEIGHT BEARING °Weight bearing as tolerated with assist device (walker, cane, etc) as directed, use it as long as suggested by your surgeon or therapist, typically at least 4-6 weeks. ° °POSTOPERATIVE CONSTIPATION PROTOCOL °Constipation - defined medically as fewer than three stools per week and severe constipation as less than one stool per week. ° °One of the most common issues patients have following surgery is constipation.  Even if you have a regular bowel pattern at home, your normal regimen is likely to be disrupted due to multiple reasons following surgery.  Combination of anesthesia, postoperative narcotics, change in appetite and fluid intake all can affect your bowels.  In order to avoid complications following surgery, here are some recommendations in order to help you during your recovery period. ° °Colace (docusate) - Pick up an over-the-counter form   of Colace or another stool softener and take twice a day as long as you are requiring postoperative pain medications.  Take with a full glass of water daily.  If you experience loose stools or diarrhea, hold the colace until you stool forms back up.  If your symptoms do not get better within 1 week or if they get worse, check with your doctor. ° °Dulcolax (bisacodyl) - Pick up over-the-counter and take as directed by the product  packaging as needed to assist with the movement of your bowels.  Take with a full glass of water.  Use this product as needed if not relieved by Colace only.  ° °MiraLax (polyethylene glycol) - Pick up over-the-counter to have on hand.  MiraLax is a solution that will increase the amount of water in your bowels to assist with bowel movements.  Take as directed and can mix with a glass of water, juice, soda, coffee, or tea.  Take if you go more than two days without a movement. °Do not use MiraLax more than once per day. Call your doctor if you are still constipated or irregular after using this medication for 7 days in a row. ° °If you continue to have problems with postoperative constipation, please contact the office for further assistance and recommendations.  If you experience "the worst abdominal pain ever" or develop nausea or vomiting, please contact the office immediatly for further recommendations for treatment. ° °ITCHING ° If you experience itching with your medications, try taking only a single pain pill, or even half a pain pill at a time.  You can also use Benadryl over the counter for itching or also to help with sleep.  ° °TED HOSE STOCKINGS °Wear the elastic stockings on both legs for three weeks following surgery during the day but you may remove then at night for sleeping. ° °MEDICATIONS °See your medication summary on the “After Visit Summary” that the nursing staff will review with you prior to discharge.  You may have some home medications which will be placed on hold until you complete the course of blood thinner medication.  It is important for you to complete the blood thinner medication as prescribed by your surgeon.  Continue your approved medications as instructed at time of discharge. ° °PRECAUTIONS °If you experience chest pain or shortness of breath - call 911 immediately for transfer to the hospital emergency department.  °If you develop a fever greater that 101 F, purulent drainage  from wound, increased redness or drainage from wound, foul odor from the wound/dressing, or calf pain - CONTACT YOUR SURGEON.   °                                                °FOLLOW-UP APPOINTMENTS °Make sure you keep all of your appointments after your operation with your surgeon and caregivers. You should call the office at the above phone number and make an appointment for approximately two weeks after the date of your surgery or on the date instructed by your surgeon outlined in the "After Visit Summary". ° °RANGE OF MOTION AND STRENGTHENING EXERCISES  °These exercises are designed to help you keep full movement of your hip joint. Follow your caregiver's or physical therapist's instructions. Perform all exercises about fifteen times, three times per day or as directed. Exercise both hips, even if you have   had only one joint replacement. These exercises can be done on a training (exercise) mat, on the floor, on a table or on a bed. Use whatever works the best and is most comfortable for you. Use music or television while you are exercising so that the exercises are a pleasant break in your day. This will make your life better with the exercises acting as a break in routine you can look forward to.   Lying on your back, slowly slide your foot toward your buttocks, raising your knee up off the floor. Then slowly slide your foot back down until your leg is straight again.   Lying on your back spread your legs as far apart as you can without causing discomfort.   Lying on your side, raise your upper leg and foot straight up from the floor as far as is comfortable. Slowly lower the leg and repeat.   Lying on your back, tighten up the muscle in the front of your thigh (quadriceps muscles). You can do this by keeping your leg straight and trying to raise your heel off the floor. This helps strengthen the largest muscle supporting your knee.   Lying on your back, tighten up the muscles of your buttocks both  with the legs straight and with the knee bent at a comfortable angle while keeping your heel on the floor.   IF YOU ARE TRANSFERRED TO A SKILLED REHAB FACILITY If the patient is transferred to a skilled rehab facility following release from the hospital, a list of the current medications will be sent to the facility for the patient to continue.  When discharged from the skilled rehab facility, please have the facility set up the patient's [place] prior to being released. Also, the skilled facility will be responsible for providing the patient with their medications at time of release from the facility to include their pain medication, the muscle relaxants, and their blood thinner medication. If the patient is still at the rehab facility at time of the two week follow up appointment, the skilled rehab facility will also need to assist the patient in arranging follow up appointment in our office and any transportation needs.  MAKE SURE YOU:   Understand these instructions.   Get help right away if you are not doing well or get worse.    Pick up stool softner and laxative for home use following surgery while on pain medications. Do not submerge incision under water. Please use good hand washing techniques while changing dressing each day. May shower starting three days after surgery. Please use a clean towel to pat the incision dry following showers. Continue to use ice for pain and swelling after surgery. Do not use any lotions or creams on the incision until instructed by your surgeon.  Information on my medicine - XARELTO (Rivaroxaban)  This medication education was reviewed with me or my healthcare representative as part of my discharge preparation.  The pharmacist that spoke with me during my hospital stay was:  Gwenlyn Fudge, Student-PharmD  Why was Xarelto prescribed for you? Xarelto was prescribed for you to reduce the risk of blood clots forming after orthopedic  surgery. The medical term for these abnormal blood clots is venous thromboembolism (VTE).  What do you need to know about xarelto ? Take your Xarelto ONCE DAILY at the same time every day. You may take it either with or without food.  If you have difficulty swallowing the tablet whole, you may crush it  and mix in applesauce just prior to taking your dose.  Take Xarelto exactly as prescribed by your doctor and DO NOT stop taking Xarelto without talking to the doctor who prescribed the medication.  Stopping without other VTE prevention medication to take the place of Xarelto may increase your risk of developing a clot.  After discharge, you should have regular check-up appointments with your healthcare provider that is prescribing your Xarelto.    What do you do if you miss a dose? If you miss a dose, take it as soon as you remember on the same day then continue your regularly scheduled once daily regimen the next day. Do not take two doses of Xarelto on the same day.   Important Safety Information A possible side effect of Xarelto is bleeding. You should call your healthcare provider right away if you experience any of the following: ? Bleeding from an injury or your nose that does not stop. ? Unusual colored urine (red or dark brown) or unusual colored stools (red or black). ? Unusual bruising for unknown reasons. ? A serious fall or if you hit your head (even if there is no bleeding).  Some medicines may interact with Xarelto and might increase your risk of bleeding while on Xarelto. To help avoid this, consult your healthcare provider or pharmacist prior to using any new prescription or non-prescription medications, including herbals, vitamins, non-steroidal anti-inflammatory drugs (NSAIDs) and supplements.  This website has more information on Xarelto: [email or web address]

## 2018-10-02 NOTE — Anesthesia Procedure Notes (Addendum)
Procedure Name: Intubation Date/Time: 10/02/2018 12:16 PM Performed by: Lavina Hamman, CRNA Pre-anesthesia Checklist: Patient identified, Emergency Drugs available, Suction available, Patient being monitored and Timeout performed Patient Re-evaluated:Patient Re-evaluated prior to induction Oxygen Delivery Method: Circle system utilized Preoxygenation: Pre-oxygenation with 100% oxygen Induction Type: IV induction Ventilation: Mask ventilation without difficulty Laryngoscope Size: Mac and 4 Grade View: Grade I Tube type: Oral Tube size: 7.5 mm Number of attempts: 1 Airway Equipment and Method: Stylet Placement Confirmation: ETT inserted through vocal cords under direct vision,  positive ETCO2,  CO2 detector and breath sounds checked- equal and bilateral Secured at: 23 cm Tube secured with: Tape Dental Injury: Teeth and Oropharynx as per pre-operative assessment

## 2018-10-02 NOTE — Plan of Care (Signed)
Pt alert and oriented, resting with c/o of constant pain.  Wife and family at the bedside. RN will monitor.

## 2018-10-02 NOTE — Transfer of Care (Signed)
Immediate Anesthesia Transfer of Care Note  Patient: Bradley Morgan  Procedure(s) Performed: Procedure(s) with comments: TOTAL HIP ARTHROPLASTY ANTERIOR APPROACH (Left) - 182min  Patient Location: PACU  Anesthesia Type:General  Level of Consciousness:  sedated, patient cooperative and responds to stimulation  Airway & Oxygen Therapy:Patient Spontanous Breathing and Patient connected to face mask oxgen  Post-op Assessment:  Report given to PACU RN and Post -op Vital signs reviewed and stable  Post vital signs:  Reviewed and stable  Last Vitals:  Vitals:   10/02/18 1134 10/02/18 1436  BP: 140/70   Pulse: 67   Resp: 18   Temp: 36.4 C (!) 36.4 C  SpO2: 35%     Complications: No apparent anesthesia complications

## 2018-10-02 NOTE — Op Note (Signed)
OPERATIVE REPORT- TOTAL HIP ARTHROPLASTY   PREOPERATIVE DIAGNOSIS: Osteoarthritis of the Left hip.   POSTOPERATIVE DIAGNOSIS: Osteoarthritis of the Left  hip.   PROCEDURE: Left total hip arthroplasty, anterior approach.   SURGEON: Gaynelle Arabian, MD   ASSISTANT: Theresa Duty, PA-C  ANESTHESIA:  General  ESTIMATED BLOOD LOSS:-1000 mL    DRAINS: Hemovac x1.   COMPLICATIONS: None   CONDITION: PACU - hemodynamically stable.   BRIEF CLINICAL NOTE: Bradley Morgan is a 68 y.o. male who has advanced end-  stage arthritis of their Left  hip with progressively worsening pain and  dysfunction.The patient has failed nonoperative management and presents for  total hip arthroplasty.   PROCEDURE IN DETAIL: After successful administration of spinal  anesthetic, the traction boots for the [facility] bed were placed on both  feet and the patient was placed onto the [facility] bed, boots placed into the leg  holders. The Left hip was then isolated from the perineum with plastic  drapes and prepped and draped in the usual sterile fashion. ASIS and  greater trochanter were marked and a oblique incision was made, starting  at about 1 cm lateral and 2 cm distal to the ASIS and coursing towards  the anterior cortex of the femur. The skin was cut with a 10 blade  through subcutaneous tissue to the level of the fascia overlying the  tensor fascia lata muscle. The fascia was then incised in line with the  incision at the junction of the anterior third and posterior 2/3rd. The  muscle was teased off the fascia and then the interval between the TFL  and the rectus was developed. The Hohmann retractor was then placed at  the top of the femoral neck over the capsule. The vessels overlying the  capsule were cauterized and the fat on top of the capsule was removed.  A Hohmann retractor was then placed anterior underneath the rectus  femoris to give exposure to the entire anterior capsule. A T-shaped   capsulotomy was performed. The edges were tagged and the femoral head  was identified.       Osteophytes are removed off the superior acetabulum.  The femoral neck was then cut in situ with an oscillating saw. Traction  was then applied to the left lower extremity utilizing the [facility]  traction. The femoral head was then removed. Retractors were placed  around the acetabulum and then circumferential removal of the labrum was  performed. Osteophytes were also removed. Reaming starts at 49 mm to  medialize and  Increased in 2 mm increments to 53 mm. We reamed in  approximately 40 degrees of abduction, 20 degrees anteversion. A 54 mm  pinnacle acetabular shell was then impacted in anatomic position under  fluoroscopic guidance with excellent purchase. We did not need to place  any additional dome screws. A 36 mm neutral + 4 marathon liner was then  placed into the acetabular shell.       The femoral lift was then placed along the lateral aspect of the femur  just distal to the vastus ridge. The leg was  externally rotated and capsule  was stripped off the inferior aspect of the femoral neck down to the  level of the lesser trochanter, this was done with electrocautery. The femur was lifted after this was performed. The  leg was then placed in an extended and adducted position essentially delivering the femur. We also removed the capsule superiorly and the piriformis from the piriformis  fossa to gain excellent exposure of the  proximal femur. Rongeur was used to remove some cancellous bone to get  into the lateral portion of the proximal femur for placement of the  initial starter reamer. The starter broaches was placed  the starter broach  and was shown to go down the center of the canal. Broaching  with the Actis system was then performed starting at size 0  coursing  Up to size 8. A size 8 had excellent torsional and rotational  and axial stability. The trial high offset neck was then placed   with a 36 + 5 trial head. The hip was then reduced. We confirmed that  the stem was in the canal both on AP and lateral x-rays. It also has excellent sizing. The hip was reduced with outstanding stability through full extension and full external rotation.. AP pelvis was taken and the leg lengths were measured and found to be equal. Hip was then dislocated again and the femoral head and neck removed. The  femoral broach was removed. Size 8 Actis stem with a high offset  neck was then impacted into the femur following native anteversion. Has  excellent purchase in the canal. Excellent torsional and rotational and  axial stability. It is confirmed to be in the canal on AP and lateral  fluoroscopic views. The 36 + 5 ceramic head was placed and the hip  reduced with outstanding stability. Again AP pelvis was taken and it  confirmed that the leg lengths were equal. The wound was then copiously  irrigated with saline solution and the capsule reattached and repaired  with Ethibond suture. 30 ml of .25% Bupivicaine was  injected into the capsule and into the edge of the tensor fascia lata as well as subcutaneous tissue. The fascia overlying the tensor fascia lata was then closed with a running #1 V-Loc. Subcu was closed with interrupted 2-0 Vicryl and subcuticular running 4-0 Monocryl. Incision was cleaned  and dried. Steri-Strips and a bulky sterile dressing applied. Hemovac  drain was hooked to suction and then the patient was awakened and transported to  recovery in stable condition.        Please note that a surgical assistant was a medical necessity for this procedure to perform it in a safe and expeditious manner. Assistant was necessary to provide appropriate retraction of vital neurovascular structures and to prevent femoral fracture and allow for anatomic placement of the prosthesis.  Gaynelle Arabian, M.D.

## 2018-10-02 NOTE — Anesthesia Preprocedure Evaluation (Addendum)
Anesthesia Evaluation  Patient identified by MRN, date of birth, ID band Patient awake  General Assessment Comment:History discussed of possible awareness during a past lumbar surgery. Questions answered and consent given. Belinda Block, MD  Reviewed: Allergy & Precautions, NPO status , Patient's Chart, lab work & pertinent test results  History of Anesthesia Complications (+) PONV and Family history of anesthesia reaction  Airway Mallampati: II  TM Distance: >3 FB     Dental   Pulmonary former smoker,    breath sounds clear to auscultation       Cardiovascular hypertension,  Rhythm:Regular Rate:Normal     Neuro/Psych    GI/Hepatic Neg liver ROS, GERD  ,  Endo/Other    Renal/GU Renal disease     Musculoskeletal  (+) Arthritis ,   Abdominal   Peds  Hematology   Anesthesia Other Findings   Reproductive/Obstetrics                            Anesthesia Physical Anesthesia Plan  ASA: III  Anesthesia Plan: General   Post-op Pain Management:    Induction: Intravenous  PONV Risk Score and Plan:   Airway Management Planned:   Additional Equipment:   Intra-op Plan:   Post-operative Plan: Possible Post-op intubation/ventilation  Informed Consent: I have reviewed the patients History and Physical, chart, labs and discussed the procedure including the risks, benefits and alternatives for the proposed anesthesia with the patient or authorized representative who has indicated his/her understanding and acceptance.   Dental advisory given  Plan Discussed with: CRNA and Anesthesiologist  Anesthesia Plan Comments:        Anesthesia Quick Evaluation

## 2018-10-02 NOTE — Interval H&P Note (Signed)
History and Physical Interval Note:  10/02/2018 11:58 AM  Bradley Morgan  has presented today for surgery, with the diagnosis of left hip osteoarthritis  The various methods of treatment have been discussed with the patient and family. After consideration of risks, benefits and other options for treatment, the patient has consented to  Procedure(s) with comments: [place] (Left) - 138min as a surgical intervention .  The patient's history has been reviewed, patient examined, no change in status, stable for surgery.  I have reviewed the patient's chart and labs.  Questions were answered to the patient's satisfaction.     [name]  Schake

## 2018-10-02 NOTE — Evaluation (Signed)
Physical Therapy Evaluation Patient Details Name: Bradley Morgan MRN: [id number] DOB: 1951/07/07 Today's Date: 10/02/2018   History of Present Illness  68 yo male s/p L DA-THA on 10/02/18. PMH includes L TKR and revision, R and L RTC repair, obesity, HTN, adrenal adenoma, GAD, GERD, history of ARF, drug overdose with encephalopathy 2015, HTN, HLD, peripheral neuropathy, RLS, ACDF C5-C7, hip arthroscopy with labral debridement, lumbar spine surgery x5.   Clinical Impression   Pt presents with severe L hip pain, LE weakness, difficulty performing all mobility tasks, and decreased activity tolerance. Pt to benefit from acute PT to address deficits. Pt required min-mod assist for mobility, and was unable to take more than 1 step tonight due to severe L hip pain. Pt educated on ankle pumps (20/hour) to perform this afternoon/evening to increase circulation, to pt's tolerance and limited by pain. PT recommending HHPT based on pt's presentation on eval and mobility deficits. PT to progress mobility as tolerated, and will continue to follow acutely.        Follow Up Recommendations Home health PT;Supervision for mobility/OOB    Equipment Recommendations  None recommended by PT    Recommendations for Other Services       Precautions / Restrictions Precautions Precautions: Fall Restrictions Weight Bearing Restrictions: No Other Position/Activity Restrictions: WBAT       Mobility  Bed Mobility Overal bed mobility: Needs Assistance Bed Mobility: Supine to Sit;Sit to Supine     Supine to sit: Min assist;HOB elevated Sit to supine: Mod assist   General bed mobility comments: Min assist for supine to sit for LLE management. Pt very slow to get to EOB due to pain. Mod assist for sit to supine for bilateral LE lifting, trunk lowering. max assist+2  for scooting pt up in bed with bed pad.   Transfers Overall transfer level: Needs assistance Equipment used: Rolling walker (2  wheeled) Transfers: Sit to/from Stand Sit to Stand: Mod assist;From elevated surface         General transfer comment: Mod assist for power up and steadying upon standing. Pt took one step forward and one step back to return to bed. Verbal cuing for hand placement, sequencing.   Ambulation/Gait Ambulation/Gait assistance: (NT- Pt states pain is 10.5/10)              Stairs            Wheelchair Mobility    Modified Rankin (Stroke Patients Only)       Balance Overall balance assessment: Needs assistance Sitting-balance support: Bilateral upper extremity supported Sitting balance-Leahy Scale: Fair     Standing balance support: Bilateral upper extremity supported Standing balance-Leahy Scale: Poor Standing balance comment: relies heavily on RW                             Pertinent Vitals/Pain Pain Assessment: 0-10 Pain Score: 10-Worst pain ever Pain Location: L hip  Pain Descriptors / Indicators: Sore;Aching Pain Intervention(s): Limited activity within patient's tolerance;Repositioned;Monitored during session;Premedicated before session    Home Living Family/patient expects to be discharged to:: Private residence Living Arrangements: Spouse/significant other Available Help at Discharge: Family;Available PRN/intermittently;Friend(s) Type of Home: House Home Access: Stairs to enter Entrance Stairs-Rails: Right;Left;Can reach both Entrance Stairs-Number of Steps: 5 Home Layout: One level Home Equipment: Crutches;Walker - 2 wheels;Cane - single point;Bedside commode      Prior Function Level of Independence: Independent         Comments:  Pt reports a couple of falls in the past     Hand Dominance   Dominant Hand: Right    Extremity/Trunk Assessment   Upper Extremity Assessment Upper Extremity Assessment: Overall WFL for tasks assessed    Lower Extremity Assessment Lower Extremity Assessment: Generalized weakness;LLE  deficits/detail LLE Deficits / Details: suspected post-surgical hip weakness; able to perform ankle pumps, weak quad set, assisted heel slide. unable to perform SLR LLE: Unable to fully assess due to pain LLE Sensation: WNL    Cervical / Trunk Assessment Cervical / Trunk Assessment: Normal  Communication   Communication: Expressive difficulties(Pt speaks quietly and difficult to hear/understand at times)  Cognition Arousal/Alertness: Awake/alert Behavior During Therapy: WFL for tasks assessed/performed Overall Cognitive Status: Within Functional Limits for tasks assessed                                        General Comments      Exercises     Assessment/Plan    PT Assessment Patient needs continued PT services  PT Problem List Decreased strength;Pain;Decreased range of motion;Decreased activity tolerance;Decreased balance;Decreased mobility;Decreased knowledge of use of DME       PT Treatment Interventions DME instruction;Therapeutic activities;Gait training;Therapeutic exercise;Patient/family education;Stair training;Balance training;Functional mobility training    PT Goals (Current goals can be found in the Care Plan section)  Acute Rehab PT Goals Patient Stated Goal: decrease L hip pain  PT Goal Formulation: With patient Time For Goal Achievement: 10/09/18 Potential to Achieve Goals: Good    Frequency 7X/week   Barriers to discharge        Co-evaluation               AM-PAC PT "6 Clicks" Mobility  Outcome Measure Help needed turning from your back to your side while in a flat bed without using bedrails?: A Lot Help needed moving from lying on your back to sitting on the side of a flat bed without using bedrails?: A Lot Help needed moving to and from a bed to a chair (including a wheelchair)?: A Lot Help needed standing up from a chair using your arms (e.g., wheelchair or bedside chair)?: A Lot Help needed to walk in hospital room?: A  Lot Help needed climbing 3-5 steps with a railing? : A Lot 6 Click Score: 12    End of Session Equipment Utilized During Treatment: Gait belt;Oxygen Activity Tolerance: Patient limited by pain;Patient limited by fatigue Patient left: in bed;with bed alarm set;with call bell/phone within reach;with SCD's reapplied Nurse Communication: Mobility status PT Visit Diagnosis: Other abnormalities of gait and mobility (R26.89);Muscle weakness (generalized) (M62.81)    Time: 2595-6387 PT Time Calculation (min) (ACUTE ONLY): 30 min   Charges:   PT Evaluation $PT Eval Low Complexity: 1 Low         Julien Girt, PT Acute Rehabilitation Services Pager [phone]  Office [phone]  Roxine Caddy D Elonda Husky 10/02/2018, 7:37 PM

## 2018-10-02 NOTE — Anesthesia Postprocedure Evaluation (Signed)
Anesthesia Post Note  Patient: Bradley Morgan  Procedure(s) Performed: TOTAL HIP ARTHROPLASTY ANTERIOR APPROACH (Left Hip)     Patient location during evaluation: PACU Anesthesia Type: General Level of consciousness: awake Pain management: pain level controlled Vital Signs Assessment: post-procedure vital signs reviewed and stable Respiratory status: spontaneous breathing Cardiovascular status: stable Postop Assessment: no apparent nausea or vomiting Anesthetic complications: no    Last Vitals:  Vitals:   10/02/18 1545 10/02/18 1600  BP: (!) 166/72 (!) 159/98  Pulse: 73 75  Resp: 15 19  Temp:  36.8 C  SpO2: 95% 95%    Last Pain:  Vitals:   10/02/18 1600  TempSrc:   PainSc: 8                  Elmus Mathes

## 2018-10-03 LAB — CBC
HEMATOCRIT: 33 % — AB (ref 39.0–52.0)
Hemoglobin: 10.3 g/dL — ABNORMAL LOW (ref 13.0–17.0)
MCH: 29.8 pg (ref 26.0–34.0)
MCHC: 31.2 g/dL (ref 30.0–36.0)
MCV: 95.4 fL (ref 80.0–100.0)
Platelets: 171 10*3/uL (ref 150–400)
RBC: 3.46 MIL/uL — ABNORMAL LOW (ref 4.22–5.81)
RDW: 13.2 % (ref 11.5–15.5)
WBC: 9.6 10*3/uL (ref 4.0–10.5)
nRBC: 0 % (ref 0.0–0.2)

## 2018-10-03 LAB — BASIC METABOLIC PANEL
Anion gap: 8 (ref 5–15)
BUN: 15 mg/dL (ref 8–23)
CO2: 26 mmol/L (ref 22–32)
Calcium: 8.5 mg/dL — ABNORMAL LOW (ref 8.9–10.3)
Chloride: 106 mmol/L (ref 98–111)
Creatinine, Ser: 0.97 mg/dL (ref 0.61–1.24)
GFR calc Af Amer: >60 mL/min (ref >60)
GFR calc non Af Amer: >60 mL/min (ref >60)
Glucose, Bld: 145 mg/dL — ABNORMAL HIGH (ref 70–99)
Potassium: 3.8 mmol/L (ref 3.5–5.1)
Sodium: 140 mmol/L (ref 135–145)

## 2018-10-03 MED ORDER — CYCLOBENZAPRINE HCL 10 MG PO TABS
10.0000 mg | ORAL_TABLET | Freq: Three times a day (TID) | ORAL | Status: DC | PRN
  Administered 2018-10-03 – 2018-10-04 (×5): 10 mg via ORAL
  Filled 2018-10-03 (×6): qty 1

## 2018-10-03 NOTE — Progress Notes (Signed)
Physical Therapy Treatment Patient Details Name: Bradley Morgan MRN: [id number] DOB: February 19, 1951 Today's Date: 10/03/2018    History of Present Illness 68 yo male s/p L DA-THA on 10/02/18. PMH includes L TKR and revision, R and L RTC repair, obesity, HTN, adrenal adenoma, GAD, GERD, history of ARF, drug overdose with encephalopathy 2015, HTN, HLD, peripheral neuropathy, RLS, ACDF C5-C7, hip arthroscopy with labral debridement, lumbar spine surgery x5.     PT Comments    Pt sleeping on arrival and stated he did not sleep last night.  Pt assisted with exercises in bed and improved alertness.  Pt does not appear to tolerate hip extension as evidenced by preference for HOB elevated and posture with ambulation (also unable to stand fully erect when cued).  Pt may have tight hip flexors due to compensations from hip pain prior to surgery.  Pt continues to report 10/10 hip pain with activity however was able to ambulate short distance this afternoon.  Spouse present and reports pt's mobility will need to improve prior to d/c home.    Follow Up Recommendations  Home health PT;Supervision for mobility/OOB     Equipment Recommendations  None recommended by PT    Recommendations for Other Services       Precautions / Restrictions Precautions Precautions: Fall Restrictions Other Position/Activity Restrictions: WBAT     Mobility  Bed Mobility Overal bed mobility: Needs Assistance Bed Mobility: Supine to Sit;Sit to Supine     Supine to sit: HOB elevated;Min guard Sit to supine: Min assist   General bed mobility comments: assist for L LE, cues for technique, pt required increased time and effort to perform  Transfers Overall transfer level: Needs assistance Equipment used: Rolling walker (2 wheeled) Transfers: Sit to/from Stand Sit to Stand: Min assist Stand pivot transfers: Min assist       General transfer comment: assist to rise and steady, assist to bring L LE forward upon  sitting for pain control, verbal cues for safe technique  Ambulation/Gait Ambulation/Gait assistance: Min assist;+2 safety/equipment Gait Distance (Feet): 12 Feet(x2) Assistive device: Rolling walker (2 wheeled) Gait Pattern/deviations: Step-to pattern;Trunk flexed;Decreased stance time - left;Antalgic Gait velocity: decr   General Gait Details: verbal cues for sequence, RW positioning, posture, very antalgic gait, pt requested seated rest break (12 feet x2)   Stairs             Wheelchair Mobility    Modified Rankin (Stroke Patients Only)       Balance Overall balance assessment: Needs assistance         Standing balance support: Bilateral upper extremity supported Standing balance-Leahy Scale: Poor Standing balance comment: relies heavily on RW                            Cognition Arousal/Alertness: Awake/alert Behavior During Therapy: WFL for tasks assessed/performed Overall Cognitive Status: Within Functional Limits for tasks assessed                                        Exercises Total Joint Exercises Ankle Circles/Pumps: AROM;10 reps;Both Quad Sets: AROM;10 reps;Left Heel Slides: AAROM;10 reps;Left Hip ABduction/ADduction: AAROM;10 reps;Left    General Comments        Pertinent Vitals/Pain Pain Assessment: 0-10 Pain Score: 10-Worst pain ever Pain Location: L hip  Pain Descriptors / Indicators: Sore;Aching Pain Intervention(s): Limited activity within  patient's tolerance;Premedicated before session;Monitored during session;Repositioned    Home Living                      Prior Function            PT Goals (current goals can now be found in the care plan section) Progress towards PT goals: Progressing toward goals    Frequency    7X/week      PT Plan Current plan remains appropriate    Co-evaluation              AM-PAC PT "6 Clicks" Mobility   Outcome Measure  Help needed turning  from your back to your side while in a flat bed without using bedrails?: A Little Help needed moving from lying on your back to sitting on the side of a flat bed without using bedrails?: A Little Help needed moving to and from a bed to a chair (including a wheelchair)?: A Little Help needed standing up from a chair using your arms (e.g., wheelchair or bedside chair)?: A Little Help needed to walk in hospital room?: A Little Help needed climbing 3-5 steps with a railing? : A Lot 6 Click Score: 17    End of Session Equipment Utilized During Treatment: Gait belt Activity Tolerance: Patient limited by pain Patient left: in bed;with call bell/phone within reach;with bed alarm set;with family/visitor present Nurse Communication: Mobility status;Patient requests pain meds PT Visit Diagnosis: Other abnormalities of gait and mobility (R26.89);Muscle weakness (generalized) (M62.81)     Time: 1324-4010 PT Time Calculation (min) (ACUTE ONLY): 26 min  Charges:  $Gait Training: 8-22 mins $Therapeutic Exercise: 8-22 mins                    Carmelia Bake, PT, DPT Acute Rehabilitation Services Office: [phone] Pager: [phone] Trena Platt 10/03/2018, 3:18 PM

## 2018-10-03 NOTE — Progress Notes (Signed)
Physical Therapy Treatment Patient Details Name: Bradley Morgan MRN: [id number] DOB: [birth year] Today's Date: 10/03/2018    History of Present Illness 68 yo male s/p L DA-THA on 10/02/18. PMH includes L TKR and revision, R and L RTC repair, obesity, HTN, adrenal adenoma, GAD, GERD, history of ARF, drug overdose with encephalopathy 2015, HTN, HLD, peripheral neuropathy, RLS, ACDF C5-C7, hip arthroscopy with labral debridement, lumbar spine surgery x5.     PT Comments    Pt assisted OOB to recliner this morning however mobility limited by increased L hip pain.  Pt requiring at least mod assist at this time.    Follow Up Recommendations  Home health PT;Supervision for mobility/OOB     Equipment Recommendations  None recommended by PT    Recommendations for Other Services       Precautions / Restrictions Precautions Precautions: Fall Restrictions Weight Bearing Restrictions: No Other Position/Activity Restrictions: WBAT     Mobility  Bed Mobility Overal bed mobility: Needs Assistance Bed Mobility: Supine to Sit     Supine to sit: Min assist;HOB elevated     General bed mobility comments: assist for L LE, cues for technique, pt required increased time and effort to perform  Transfers Overall transfer level: Needs assistance Equipment used: Rolling walker (2 wheeled) Transfers: Sit to/from [facility] Sit to Stand: Mod assist;From elevated surface Stand pivot transfers: Min assist       General transfer comment: assist to rise and steady, assist to bring L LE forward upon sitting for pain control, verbal cues for safe technique  Ambulation/Gait             General Gait Details: deferred at this time: pt with increased pain and difficulty weight bearing on L LE   Stairs             Wheelchair Mobility    Modified Rankin (Stroke Patients Only)       Balance Overall balance assessment: Needs assistance         Standing balance  support: Bilateral upper extremity supported Standing balance-Leahy Scale: Poor Standing balance comment: relies heavily on RW                            Cognition Arousal/Alertness: Awake/alert Behavior During Therapy: WFL for tasks assessed/performed Overall Cognitive Status: Within Functional Limits for tasks assessed                                        Exercises      General Comments        Pertinent Vitals/Pain Pain Assessment: 0-10 Pain Score: 10-Worst pain ever Pain Location: L hip  Pain Descriptors / Indicators: Sore;Aching Pain Intervention(s): Premedicated before session;Repositioned;Monitored during session;Limited activity within patient's tolerance;Patient requesting pain meds-RN notified;Ice applied    Home Living                      Prior Function            PT Goals (current goals can now be found in the care plan section) Progress towards PT goals: Progressing toward goals    Frequency    7X/week      PT Plan Current plan remains appropriate    Co-evaluation              AM-PAC PT "6 Clicks"  Mobility   Outcome Measure  Help needed turning from your back to your side while in a flat bed without using bedrails?: A Little Help needed moving from lying on your back to sitting on the side of a flat bed without using bedrails?: A Lot Help needed moving to and from a bed to a chair (including a wheelchair)?: A Lot Help needed standing up from a chair using your arms (e.g., wheelchair or bedside chair)?: A Lot Help needed to walk in hospital room?: A Lot Help needed climbing 3-5 steps with a railing? : Total 6 Click Score: 12    End of Session Equipment Utilized During Treatment: Gait belt;Oxygen Activity Tolerance: Patient limited by pain Patient left: in chair;with chair alarm set;with call bell/phone within reach;with nursing/sitter in room Nurse Communication: Mobility status;Patient requests pain  meds PT Visit Diagnosis: Other abnormalities of gait and mobility (R26.89);Muscle weakness (generalized) (M62.81)     Time: 6629-4765 PT Time Calculation (min) (ACUTE ONLY): 14 min  Charges:  $Gait Training: 8-22 mins                     Carmelia Bake, PT, DPT Acute Rehabilitation Services Office: [phone] Pager: [phone]  Trena Platt 10/03/2018, 11:33 AM

## 2018-10-03 NOTE — Care Management Note (Signed)
Case Management Note  Patient Details  Name: [name] MRN: [id number] Date of Birth: June 24, 1951  Subjective/Objective:      Spoke with patient at bedside. Confirmed plan for HEP. Has RW and 3n1. [phone]              Action/Plan:   Expected Discharge Date:  10/03/18               Expected Discharge Plan:  Home/Self Care  In-House Referral:  NA  Discharge planning Services  CM Consult  Post Acute Care Choice:  NA Choice offered to:  Patient  DME Arranged:  N/A DME Agency:  NA  HH Arranged:  NA HH Agency:  NA  Status of Service:  Completed, signed off  If discussed at [place] of Stay Meetings, dates discussed:    Additional Comments:  Guadalupe Maple, RN 10/03/2018, 10:06 AM

## 2018-10-03 NOTE — Plan of Care (Signed)
  Problem: Health Behavior/Discharge Planning: Goal: Ability to manage health-related needs will improve Outcome: Progressing   Problem: Activity: Goal: Risk for activity intolerance will decrease Outcome: Progressing   Problem: Skin Integrity: Goal: Risk for impaired skin integrity will decrease Outcome: Progressing   

## 2018-10-03 NOTE — Progress Notes (Signed)
Subjective: 1 Day Post-Op Procedure(s) (LRB): TOTAL HIP ARTHROPLASTY ANTERIOR APPROACH (Left) Patient reports pain as moderate.   Patient seen in rounds by Dr. Wynelle Link. Patient is well, and has had no acute complaints or problems other than pain in the left hip and lower back. History of extensive lumbar surgery. No issues overnight. Foley catheter removed this AM. Denies chest pain or SOB. We will continue therapy today.   Objective: Vital signs in last 24 hours: Temp:  [97.5 F (36.4 C)-98.4 F (36.9 C)] 98.4 F (36.9 C) (01/14 0212) Pulse Rate:  [67-102] 95 (01/14 0212) Resp:  [14-22] 20 (01/14 0212) BP: (140-187)/(70-104) 152/91 (01/14 0212) SpO2:  [95 %-100 %] 97 % (01/14 0212) Weight:  [126.6 kg] 126.6 kg (01/13 1130)  Intake/Output from previous day:  Intake/Output Summary (Last 24 hours) at 10/03/2018 0717 Last data filed at 10/03/2018 0550 Gross per 24 hour  Intake 4803.46 ml  Output 4080 ml  Net 723.46 ml    Labs: Recent Labs    10/02/18 1403 10/03/18 0448  HGB 9.5* 10.3*   Recent Labs    10/02/18 1403 10/03/18 0448  WBC  --  9.6  RBC  --  3.46*  HCT 28.0* 33.0*  PLT  --  171   Recent Labs    10/02/18 1403 10/03/18 0448  NA 140 140  K 4.1 3.8  CL  --  106  CO2  --  26  BUN  --  15  CREATININE  --  0.97  GLUCOSE 124* 145*  CALCIUM  --  8.5*   Exam: General - Patient is Alert and Oriented Extremity - Neurologically intact Neurovascular intact Sensation intact distally Dorsiflexion/Plantar flexion intact Dressing - dressing C/D/I Motor Function - intact, moving foot and toes well on exam.   Past Medical History:  Diagnosis Date  . Adrenal adenoma, left   . Arthritis   . Bursitis of elbow   . Complication of anesthesia    ANESTHESIA DID NOT TAKE EFFECT WITH BACK SURGERY "I FELT THEM CUT"  . Congenital absence of right kidney   . Diverticulosis of colon   . Family history of anesthesia complication    "hard time waking up my dad"  many yrs ago  . Frequency of urination   . Full dentures   . GAD (generalized anxiety disorder)   . GERD (gastroesophageal reflux disease)   . History of acute renal failure    12-16-2013;  09-24-2017 due to stone obstruction  . 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 History of encephalopathy 04/26/2014   ACUTE EPISODE SECONDARY TO DRUG OVERDOSE (NARC, BZD, FLEXERIL)  . History of GI bleed    08/ 2001   duodenal ulcer bleed post egd w/ cauterization  . History of prostatitis   . Hyperlipidemia   . Hypertension   . Left ureteral stone   . Nocturia more than twice per night   . Peripheral neuropathy    bilateral leg numbness due to back issues, walks w/ cane  . PONV (postoperative nausea and vomiting)   . Restless leg syndrome   . Solitary left kidney   . Wears glasses     Assessment/Plan: 1 Day Post-Op Procedure(s) (LRB): TOTAL HIP ARTHROPLASTY ANTERIOR APPROACH (Left) Principal Problem:   OA (osteoarthritis) of hip  Estimated body mass index is 36.81 kg/m as calculated from the following:   Height as of this encounter: 6\' 1"  (  1.854 m).   Weight as of this encounter: 126.6 kg. Advance diet Up with therapy  DVT Prophylaxis - Xarelto Weight bearing as tolerated. D/C O2 and pulse ox and try on room air. Hemovac pulled without difficulty, will continue therapy.  Plan is to go Home after hospital stay. Plan for discharge tomorrow if progresses with therapy and meeting goals.  Theresa Duty, PA-C Orthopedic Surgery 10/03/2018, 7:17 AM

## 2018-10-04 ENCOUNTER — Encounter (HOSPITAL_COMMUNITY): Payer: Self-pay | Admitting: Orthopedic Surgery

## 2018-10-04 LAB — CBC
HCT: 32.7 % — ABNORMAL LOW (ref 39.0–52.0)
Hemoglobin: 10.1 g/dL — ABNORMAL LOW (ref 13.0–17.0)
MCH: 30.1 pg (ref 26.0–34.0)
MCHC: 30.9 g/dL (ref 30.0–36.0)
MCV: 97.3 fL (ref 80.0–100.0)
Platelets: 144 10*3/uL — ABNORMAL LOW (ref 150–400)
RBC: 3.36 MIL/uL — ABNORMAL LOW (ref 4.22–5.81)
RDW: 13.7 % (ref 11.5–15.5)
WBC: 9.5 10*3/uL (ref 4.0–10.5)
nRBC: 0 % (ref 0.0–0.2)

## 2018-10-04 LAB — BASIC METABOLIC PANEL
Anion gap: 7 (ref 5–15)
BUN: 19 mg/dL (ref 8–23)
CALCIUM: 8.6 mg/dL — AB (ref 8.9–10.3)
CO2: 28 mmol/L (ref 22–32)
Chloride: 106 mmol/L (ref 98–111)
Creatinine, Ser: 0.99 mg/dL (ref 0.61–1.24)
GFR calc Af Amer: >60 mL/min (ref >60)
Glucose, Bld: 112 mg/dL — ABNORMAL HIGH (ref 70–99)
Potassium: 3.6 mmol/L (ref 3.5–5.1)
SODIUM: 141 mmol/L (ref 135–145)

## 2018-10-04 MED ORDER — RIVAROXABAN 10 MG PO TABS: 10.0000 mg | ORAL_TABLET | Freq: Every day | ORAL | 0 refills | Status: DC

## 2018-10-04 NOTE — Progress Notes (Signed)
Physical Therapy Treatment Patient Details Name: Bradley Morgan MRN: [id number] DOB: 05-Dec-1950 Today's Date: 10/04/2018    History of Present Illness 68 yo male s/p L DA-THA on 10/02/18. PMH includes L TKR and revision, R and L RTC repair, obesity, HTN, adrenal adenoma, GAD, GERD, history of ARF, drug overdose with encephalopathy 2015, HTN, HLD, peripheral neuropathy, RLS, ACDF C5-C7, hip arthroscopy with labral debridement, lumbar spine surgery x5.     PT Comments    Pt ambulated short distance to steps and then performed steps with min assist.  Pt returned to recliner due to fatigue.  Pt reports he wishes to d/c home.  Pt min/guard to min assist for mobility at this time.  Pt reports if his pain was better controlled, then he would do better.  (discussed this with RN and that pt states he is on chronic pain meds at baseline which spouse confirms).  Pt provided with HEP handout and verbally reviewed (also recommended pt wait to perform standing exercises with next PT for safety) so pt could save energy for d/c home today.   Follow Up Recommendations  Home health PT;Supervision for mobility/OOB     Equipment Recommendations  None recommended by PT    Recommendations for Other Services       Precautions / Restrictions Precautions Precautions: Fall Restrictions Other Position/Activity Restrictions: WBAT     Mobility  Bed Mobility Overal bed mobility: Needs Assistance Bed Mobility: Supine to Sit     Supine to sit: HOB elevated;Min guard     General bed mobility comments: pt up in recliner on arrival  Transfers Overall transfer level: Needs assistance Equipment used: Rolling walker (2 wheeled) Transfers: Sit to/from Stand Sit to Stand: Min assist         General transfer comment:  verbal cues for safe technique, improved rise from armrests of recliner (bil UE assist) however assist for controlling descent, cues for L LE forward for pain  control  Ambulation/Gait Ambulation/Gait assistance: Min guard Gait Distance (Feet): 15 Feet Assistive device: Rolling walker (2 wheeled) Gait Pattern/deviations: Step-to pattern;Trunk flexed;Decreased stance time - left;Antalgic Gait velocity: decr   General Gait Details: verbal cues for sequence, RW positioning, posture, very antalgic gait, recliner following for safety and pt fatigues quickly   Stairs Stairs: Yes Stairs assistance: Min assist Stair Management: Step to pattern;Forwards;Two rails Number of Stairs: 2 General stair comments: verbal cues for safety, sequence; assist for stabilizing, pt reports son can assist at home, spouse present and observed today   Wheelchair Mobility    Modified Rankin (Stroke Patients Only)       Balance                                            Cognition Arousal/Alertness: Awake/alert Behavior During Therapy: WFL for tasks assessed/performed Overall Cognitive Status: Within Functional Limits for tasks assessed                                        Exercises      General Comments        Pertinent Vitals/Pain Pain Assessment: 0-10 Pain Score: 8  Pain Location: L hip and L knee Pain Descriptors / Indicators: Sore;Aching Pain Intervention(s): Limited activity within patient's tolerance;Premedicated before session;Monitored during session;Repositioned  Home Living                      Prior Function            PT Goals (current goals can now be found in the care plan section) Progress towards PT goals: Progressing toward goals    Frequency    7X/week      PT Plan Current plan remains appropriate    Co-evaluation              AM-PAC PT "6 Clicks" Mobility   Outcome Measure  Help needed turning from your back to your side while in a flat bed without using bedrails?: A Little Help needed moving from lying on your back to sitting on the side of a flat bed  without using bedrails?: A Little Help needed moving to and from a bed to a chair (including a wheelchair)?: A Little Help needed standing up from a chair using your arms (e.g., wheelchair or bedside chair)?: A Little Help needed to walk in hospital room?: A Little Help needed climbing 3-5 steps with a railing? : A Little 6 Click Score: 18    End of Session Equipment Utilized During Treatment: Gait belt Activity Tolerance: Patient limited by pain Patient left: in chair;with call bell/phone within reach;with chair alarm set;with family/visitor present Nurse Communication: Mobility status PT Visit Diagnosis: Other abnormalities of gait and mobility (R26.89);Muscle weakness (generalized) (M62.81)     Time: 3903-0092 PT Time Calculation (min) (ACUTE ONLY): 20 min  Charges:  $Gait Training: 8-22 mins                    Carmelia Bake, PT, DPT Acute Rehabilitation Services Office: [phone] Pager: [phone]  Trena Platt 10/04/2018, 4:25 PM

## 2018-10-04 NOTE — Progress Notes (Signed)
Reviewed discharge information.  Both patient and wife verbalized understanding.  All questions/concerns addressed.  Wife states son will be available when they arrive home to assist with patient safely entering/going up steps.

## 2018-10-04 NOTE — Progress Notes (Signed)
Subjective: 2 Days Post-Op Procedure(s) (LRB): TOTAL HIP ARTHROPLASTY ANTERIOR APPROACH (Left) Patient reports pain as moderate.   Patient seen in rounds for Dr. Wynelle Link. Patient is well, and has had no acute complaints or problems other than pain in the left hip. No issues overnight. Denies chest pain or SOB. Voiding without difficulty and positive flatus. Plan is to go Home after hospital stay.  Objective: Vital signs in last 24 hours: Temp:  [98.4 F (36.9 C)-98.9 F (37.2 C)] 98.4 F (36.9 C) (01/15 0515) Pulse Rate:  [84-100] 94 (01/15 0515) Resp:  [17-28] 20 (01/15 0600) BP: (123-149)/(62-87) 149/87 (01/15 0515) SpO2:  [91 %-98 %] 98 % (01/15 0515)  Intake/Output from previous day:  Intake/Output Summary (Last 24 hours) at 10/04/2018 0748 Last data filed at 10/04/2018 0600 Gross per 24 hour  Intake 1153.97 ml  Output 1500 ml  Net -346.03 ml    Labs: Recent Labs    10/02/18 1403 10/03/18 0448 10/04/18 0335  HGB 9.5* 10.3* 10.1*   Recent Labs    10/03/18 0448 10/04/18 0335  WBC 9.6 9.5  RBC 3.46* 3.36*  HCT 33.0* 32.7*  PLT 171 144*   Recent Labs    10/03/18 0448 10/04/18 0335  NA 140 141  K 3.8 3.6  CL 106 106  CO2 26 28  BUN 15 19  CREATININE 0.97 0.99  GLUCOSE 145* 112*  CALCIUM 8.5* 8.6*   Exam: General - Patient is Alert and Oriented Extremity - Neurologically intact Neurovascular intact Sensation intact distally Dorsiflexion/Plantar flexion intact Dressing/Incision - clean, dry, no drainage Motor Function - intact, moving foot and toes well on exam.   Past Medical History:  Diagnosis Date  . Adrenal adenoma, left   . Arthritis   . Bursitis of elbow   . Complication of anesthesia    ANESTHESIA DID NOT TAKE EFFECT WITH BACK SURGERY "I FELT THEM CUT"  . Congenital absence of right kidney   . Diverticulosis of colon   . Family history of anesthesia complication    "hard time waking up my dad" many yrs ago  . Frequency of urination    . Full dentures   . GAD (generalized anxiety disorder)   . GERD (gastroesophageal reflux disease)   . History of acute renal failure    12-16-2013;  09-24-2017 due to stone obstruction  . 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 History of encephalopathy 04/26/2014   ACUTE EPISODE SECONDARY TO DRUG OVERDOSE (NARC, BZD, FLEXERIL)  . History of GI bleed    08/ 2001   duodenal ulcer bleed post egd w/ cauterization  . History of prostatitis   . Hyperlipidemia   . Hypertension   . Left ureteral stone   . Nocturia more than twice per night   . Peripheral neuropathy    bilateral leg numbness due to back issues, walks w/ cane  . PONV (postoperative nausea and vomiting)   . Restless leg syndrome   . Solitary left kidney   . Wears glasses     Assessment/Plan: 2 Days Post-Op Procedure(s) (LRB): TOTAL HIP ARTHROPLASTY ANTERIOR APPROACH (Left) Principal Problem:   OA (osteoarthritis) of hip  Estimated body mass index is 36.81 kg/m as calculated from the following:   Height as of this encounter: 6\' 1"  (1.854 m).   Weight as of this encounter: 126.6 kg. Up with therapy D/C IV fluids  DVT Prophylaxis - Xarelto Weight-bearing as  tolerated  Had slower progression with therapy yesterday, will continue working with PT for at least two sessions today. Possible discharge if meeting goals, otherwise will stay until tomorrow. Follow-up in the office in 2 weeks with Dr. Wynelle Link.  Theresa Duty, PA-C Orthopedic Surgery 10/04/2018, 7:48 AM

## 2018-10-04 NOTE — Progress Notes (Signed)
Physical Therapy Treatment Patient Details Name: Bradley Morgan MRN: [id number] DOB: Nov 09, 1950 Today's Date: 10/04/2018    History of Present Illness 68 yo male s/p L DA-THA on 10/02/18. PMH includes L TKR and revision, R and L RTC repair, obesity, HTN, adrenal adenoma, GAD, GERD, history of ARF, drug overdose with encephalopathy 2015, HTN, HLD, peripheral neuropathy, RLS, ACDF C5-C7, hip arthroscopy with labral debridement, lumbar spine surgery x5.     PT Comments    Pt premedicated for session and reports feeling better today but still endorses increased L hip pain with activity.  Pt able to ambulate 30 feet however too fatigued to practice steps after.  Pt anticipates d/c home today however will need to be able to perform steps.  Requested pt have family available to observe/assist his performance with activities with afternoon session.     Follow Up Recommendations  Home health PT;Supervision for mobility/OOB     Equipment Recommendations  None recommended by PT    Recommendations for Other Services       Precautions / Restrictions Precautions Precautions: Fall Restrictions Other Position/Activity Restrictions: WBAT     Mobility  Bed Mobility Overal bed mobility: Needs Assistance Bed Mobility: Supine to Sit     Supine to sit: HOB elevated;Min guard     General bed mobility comments: pt required increased time and effort to perform; pt places HOB up and requests elevated bed; reports he can sleep in lift chair at home  Transfers Overall transfer level: Needs assistance Equipment used: Rolling walker (2 wheeled) Transfers: Sit to/from Stand Sit to Stand: Min assist         General transfer comment: assist to rise and steady, verbal cues for safe technique  Ambulation/Gait Ambulation/Gait assistance: Min guard Gait Distance (Feet): 30 Feet Assistive device: Rolling walker (2 wheeled) Gait Pattern/deviations: Step-to pattern;Trunk flexed;Decreased stance time  - left;Antalgic Gait velocity: decr   General Gait Details: verbal cues for sequence, RW positioning, posture, very antalgic gait, recliner following for safety and pt requested after 30 feet   Stairs             Wheelchair Mobility    Modified Rankin (Stroke Patients Only)       Balance                                            Cognition Arousal/Alertness: Awake/alert Behavior During Therapy: WFL for tasks assessed/performed Overall Cognitive Status: Within Functional Limits for tasks assessed                                        Exercises      General Comments        Pertinent Vitals/Pain Pain Assessment: 0-10 Pain Score: 8  Pain Location: L hip  Pain Descriptors / Indicators: Sore;Aching Pain Intervention(s): Monitored during session;Limited activity within patient's tolerance;Repositioned;Premedicated before session    Home Living                      Prior Function            PT Goals (current goals can now be found in the care plan section) Progress towards PT goals: Progressing toward goals    Frequency    7X/week  PT Plan Current plan remains appropriate    Co-evaluation              AM-PAC PT "6 Clicks" Mobility   Outcome Measure  Help needed turning from your back to your side while in a flat bed without using bedrails?: A Little Help needed moving from lying on your back to sitting on the side of a flat bed without using bedrails?: A Little Help needed moving to and from a bed to a chair (including a wheelchair)?: A Little Help needed standing up from a chair using your arms (e.g., wheelchair or bedside chair)?: A Little Help needed to walk in hospital room?: A Little Help needed climbing 3-5 steps with a railing? : A Lot 6 Click Score: 17    End of Session Equipment Utilized During Treatment: Gait belt Activity Tolerance: Patient limited by pain Patient left: in  chair;with call bell/phone within reach;with chair alarm set Nurse Communication: Mobility status PT Visit Diagnosis: Other abnormalities of gait and mobility (R26.89);Muscle weakness (generalized) (M62.81)     Time: 4497-5300 PT Time Calculation (min) (ACUTE ONLY): 16 min  Charges:  $Gait Training: 8-22 mins                    Carmelia Bake, PT, DPT Acute Rehabilitation Services Office: [phone] Pager: [phone]  Trena Platt 10/04/2018, 1:06 PM

## 2018-10-06 LAB — TYPE AND SCREEN
ABO/RH(D): A POS
Antibody Screen: NEGATIVE
UNIT DIVISION: 0
Unit division: 0

## 2018-10-06 LAB — BPAM RBC
Blood Product Expiration Date: 202002042359
Blood Product Expiration Date: 202002042359
ISSUE DATE / TIME: 202001131400
ISSUE DATE / TIME: 202001131400
UNIT TYPE AND RH: 6200
UNIT TYPE AND RH: 6200

## 2018-10-09 NOTE — Discharge Summary (Signed)
Physician Discharge Summary   Patient ID: VANESSA ALESI MRN: [id number] DOB/AGE: 1951-06-02 68 y.o.  Admit date: 10/02/2018 Discharge date: 10/04/2018  Primary Diagnosis: Osteoarthritis, left hip   Admission Diagnoses:  Past Medical History:  Diagnosis Date  . Adrenal adenoma, left   . Arthritis   . Bursitis of elbow   . Complication of anesthesia    ANESTHESIA DID NOT TAKE EFFECT WITH BACK SURGERY "I FELT THEM CUT"  . Congenital absence of right kidney   . Diverticulosis of colon   . Family history of anesthesia complication    "hard time waking up my dad" many yrs ago  . Frequency of urination   . Full dentures   . GAD (generalized anxiety disorder)   . GERD (gastroesophageal reflux disease)   . History of acute renal failure    12-16-2013;  09-24-2017 due to stone obstruction  . 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 History of encephalopathy 04/26/2014   ACUTE EPISODE SECONDARY TO DRUG OVERDOSE (NARC, BZD, FLEXERIL)  . History of GI bleed    08/ 2001   duodenal ulcer bleed post egd w/ cauterization  . History of prostatitis   . Hyperlipidemia   . Hypertension   . Left ureteral stone   . Nocturia more than twice per night   . Peripheral neuropathy    bilateral leg numbness due to back issues, walks w/ cane  . PONV (postoperative nausea and vomiting)   . Restless leg syndrome   . Solitary left kidney   . Wears glasses    Discharge Diagnoses:   Principal Problem:   OA (osteoarthritis) of hip  Estimated body mass index is 36.81 kg/m as calculated from the following:   Height as of this encounter: 6\' 1"  (1.854 m).   Weight as of this encounter: 126.6 kg.  Procedure:  Procedure(s) (LRB): TOTAL HIP ARTHROPLASTY ANTERIOR APPROACH (Left)   Consults: None  HPI: HERVE HAUG is a 68 y.o. male who has advanced end-stage arthritis of their Left  hip with progressively worsening pain and  dysfunction.The patient has failed nonoperative management and presents for total hip arthroplasty.   Laboratory Data: Admission on 10/02/2018, Discharged on 10/04/2018  Component Date Value Ref Range Status  . Order Confirmation 10/02/2018    Final                   Value:ORDER PROCESSED BY BLOOD BANK Performed at [facility], [place] [address], [place], [place] [address]   . Sodium 10/02/2018 140  135 - 145 mmol/L Final  . Potassium 10/02/2018 4.1  3.5 - 5.1 mmol/L Final  . Glucose, Bld 10/02/2018 124* 70 - 99 mg/dL Final  . HCT 10/02/2018 28.0* 39.0 - 52.0 % Final  . Hemoglobin 10/02/2018 9.5* 13.0 - 17.0 g/dL Final  . WBC 10/03/2018 9.6  4.0 - 10.5 K/uL Final  . RBC 10/03/2018 3.46* 4.22 - 5.81 MIL/uL Final  . Hemoglobin 10/03/2018 10.3* 13.0 - 17.0 g/dL Final  . HCT 10/03/2018 33.0* 39.0 - 52.0 % Final  . MCV 10/03/2018 95.4  80.0 - 100.0 fL Final  . MCH 10/03/2018 29.8  26.0 - 34.0 pg Final  . MCHC 10/03/2018 31.2  30.0 - 36.0 g/dL Final  . RDW 10/03/2018 13.2  11.5 - 15.5 % Final  . Platelets 10/03/2018 171  150 - 400 K/uL Final  . nRBC 10/03/2018 0.0  0.0 -  0.2 % Final   Performed at [facility], [place] [address], [place], [place] [address]  . Sodium 10/03/2018 140  135 - 145 mmol/L Final  . Potassium 10/03/2018 3.8  3.5 - 5.1 mmol/L Final  . Chloride 10/03/2018 106  98 - 111 mmol/L Final  . CO2 10/03/2018 26  22 - 32 mmol/L Final  . Glucose, Bld 10/03/2018 145* 70 - 99 mg/dL Final  . BUN 10/03/2018 15  8 - 23 mg/dL Final  . Creatinine, Ser 10/03/2018 0.97  0.61 - 1.24 mg/dL Final  . Calcium 10/03/2018 8.5* 8.9 - 10.3 mg/dL Final  . GFR calc non Af Amer 10/03/2018 >60  >60 mL/min Final  . GFR calc Af Amer 10/03/2018 >60  >60 mL/min Final  . Anion gap 10/03/2018 8  5 - 15 Final   Performed at [facility], [place] [address], [place], [place] [address]  . WBC 10/04/2018 9.5  4.0 - 10.5 K/uL Final  . RBC 10/04/2018 3.36*  4.22 - 5.81 MIL/uL Final  . Hemoglobin 10/04/2018 10.1* 13.0 - 17.0 g/dL Final  . HCT 10/04/2018 32.7* 39.0 - 52.0 % Final  . MCV 10/04/2018 97.3  80.0 - 100.0 fL Final  . MCH 10/04/2018 30.1  26.0 - 34.0 pg Final  . MCHC 10/04/2018 30.9  30.0 - 36.0 g/dL Final  . RDW 10/04/2018 13.7  11.5 - 15.5 % Final  . Platelets 10/04/2018 144* 150 - 400 K/uL Final  . nRBC 10/04/2018 0.0  0.0 - 0.2 % Final   Performed at [facility], [place] [address], [place], [place] [address]  . Sodium 10/04/2018 141  135 - 145 mmol/L Final  . Potassium 10/04/2018 3.6  3.5 - 5.1 mmol/L Final  . Chloride 10/04/2018 106  98 - 111 mmol/L Final  . CO2 10/04/2018 28  22 - 32 mmol/L Final  . Glucose, Bld 10/04/2018 112* 70 - 99 mg/dL Final  . BUN 10/04/2018 19  8 - 23 mg/dL Final  . Creatinine, Ser 10/04/2018 0.99  0.61 - 1.24 mg/dL Final  . Calcium 10/04/2018 8.6* 8.9 - 10.3 mg/dL Final  . GFR calc non Af Amer 10/04/2018 >60  >60 mL/min Final  . GFR calc Af Amer 10/04/2018 >60  >60 mL/min Final  . Anion gap 10/04/2018 7  5 - 15 Final   Performed at [facility], [place] [address] [place], [place] [address]  Hospital Outpatient Visit on 09/25/2018  Component Date Value Ref Range Status  . aPTT 09/25/2018 32  24 - 36 seconds Final   Performed at [facility], [place] [address], [place], [place] [address]  . WBC 09/25/2018 4.7  4.0 - 10.5 K/uL Final  . RBC 09/25/2018 3.88* 4.22 - 5.81 MIL/uL Final  . Hemoglobin 09/25/2018 12.0* 13.0 - 17.0 g/dL Final  . HCT 09/25/2018 38.1* 39.0 - 52.0 % Final  . MCV 09/25/2018 98.2  80.0 - 100.0 fL Final  . MCH 09/25/2018 30.9  26.0 - 34.0 pg Final  . MCHC 09/25/2018 31.5  30.0 - 36.0 g/dL Final  . RDW 09/25/2018 13.5  11.5 - 15.5 % Final  . Platelets 09/25/2018 175  150 - 400 K/uL Final  . nRBC 09/25/2018 0.0  0.0 - 0.2 % Final   Performed at [facility], [place] [address], [place], [place] [address]  . Sodium  09/25/2018 141  135 - 145 mmol/L Final  . Potassium 09/25/2018 4.5  3.5 - 5.1  mmol/L Final  . Chloride 09/25/2018 106  98 - 111 mmol/L Final  . CO2 09/25/2018 28  22 - 32 mmol/L Final  . Glucose, Bld 09/25/2018 97  70 - 99 mg/dL Final  . BUN 09/25/2018 19  8 - 23 mg/dL Final  . Creatinine, Ser 09/25/2018 0.93  0.61 - 1.24 mg/dL Final  . Calcium 09/25/2018 9.2  8.9 - 10.3 mg/dL Final  . Total Protein 09/25/2018 6.5  6.5 - 8.1 g/dL Final  . Albumin 09/25/2018 4.0  3.5 - 5.0 g/dL Final  . AST 09/25/2018 21  15 - 41 U/L Final  . ALT 09/25/2018 21  0 - 44 U/L Final  . Alkaline Phosphatase 09/25/2018 78  38 - 126 U/L Final  . Total Bilirubin 09/25/2018 0.5  0.3 - 1.2 mg/dL Final  . GFR calc non Af Amer 09/25/2018 >60  >60 mL/min Final  . GFR calc Af Amer 09/25/2018 >60  >60 mL/min Final  . Anion gap 09/25/2018 7  5 - 15 Final   Performed at [facility], [place] [address] [place], [place] [address]  . Prothrombin Time 09/25/2018 12.2  11.4 - 15.2 seconds Final  . INR 09/25/2018 0.91   Final   Performed at [place] [address], [place], [place] [address]  . ABO/RH(D) 09/25/2018 A POS   Final  . Antibody Screen 09/25/2018 NEG   Final  . Sample Expiration 09/25/2018 10/05/2018   Final  . Extend sample reason 09/25/2018 NO TRANSFUSIONS OR PREGNANCY IN THE PAST 3 MONTHS   Final  . Unit Number 09/25/2018 [id number]   Final  . Blood Component Type 09/25/2018 RED CELLS,LR   Final  . Unit division 09/25/2018 00   Final  . Status of Unit 09/25/2018 REL FROM [facility]   Final  . Transfusion Status 09/25/2018 OK TO TRANSFUSE   Final  . Crossmatch Result 09/25/2018 Compatible   Final  . Unit Number 09/25/2018 [id number]   Final  . Blood Component Type 09/25/2018 RED CELLS,LR   Final  . Unit division 09/25/2018 00   Final  . Status of Unit 09/25/2018 REL FROM [facility]   Final  . Transfusion Status 09/25/2018 OK TO TRANSFUSE   Final  . Crossmatch Result  09/25/2018    Final                   Value:Compatible Performed at [facility], [place] [address] [place], [place] [address]   . MRSA, PCR 09/25/2018 NEGATIVE  NEGATIVE Final  . Staphylococcus aureus 09/25/2018 POSITIVE* NEGATIVE Final   Comment: (NOTE) The Xpert SA Assay (FDA approved for NASAL specimens in patients 11 years of age and older), is one component of a comprehensive surveillance program. It is not intended to diagnose infection nor to guide or monitor treatment. Performed at [facility], [place] [address] [place], [place] [address]   . ISSUE DATE / TIME 09/25/2018 [id number]   Final  . Blood Product Unit Number 09/25/2018 [id number]   Final  . PRODUCT CODE 09/25/2018 [id number]   Final  . Unit Type and Rh 09/25/2018 6200   Final  . Blood Product Expiration Date 09/25/2018 [id number]   Final  . ISSUE DATE / TIME 09/25/2018 [id number]   Final  . Blood Product Unit Number 09/25/2018 [id number]   Final  . PRODUCT CODE 09/25/2018 [id number]   Final  . Unit Type and Rh 09/25/2018 6200   Final  . Blood Product Expiration Date  09/25/2018 [id number]   Final     X-Rays:Dg Pelvis Portable  Result Date: 10/02/2018 CLINICAL DATA:  Status post left hip replacement EXAM: PORTABLE PELVIS 1-2 VIEWS COMPARISON:  None. FINDINGS: Patient is status post left hip replacement. A surgical drain is noted. Hardware is in good position. No other acute abnormalities. IMPRESSION: Left hip replacement as above. Electronically Signed   By: Dorise Bullion III M.D   On: 10/02/2018 15:37   Dg C-arm 1-60 Min-no Report  Result Date: 10/02/2018 Fluoroscopy was utilized by the requesting physician.  No radiographic interpretation.   Dg Hip Operative Unilat With Pelvis Left  Result Date: 10/02/2018 CLINICAL DATA:  Left hip replacement. EXAM: OPERATIVE left HIP (WITH PELVIS IF PERFORMED) 2 VIEWS TECHNIQUE: Fluoroscopic spot image(s) were submitted for  interpretation post-operatively. Radiation exposure index: 3.13 mGy. COMPARISON:  None. FINDINGS: Two intraoperative fluoroscopic images of the left hip demonstrate the acetabular and femoral components to be well situated. Expected postoperative changes are seen in the surrounding soft tissues. IMPRESSION: Status post left total hip arthroplasty. Electronically Signed   By: Marijo Conception, M.D.   On: 10/02/2018 15:50    EKG: Orders placed or performed during the hospital encounter of 10/02/18  . EKG     Hospital Course: TOMASZ STEEVES is a 68 y.o. who was admitted to [facility]. They were brought to the operating room on 10/02/2018 and underwent Procedure(s): [place].  Patient tolerated the procedure well and was later transferred to the recovery room and then to the orthopaedic floor for postoperative care. They were given PO and IV analgesics for pain control following their surgery. They were given 24 hours of postoperative antibiotics of  Anti-infectives (From admission, onward)   Start     Dose/Rate Route Frequency Ordered Stop   10/02/18 2000  ceFAZolin (ANCEF) IVPB 2g/100 mL premix     2 g 200 mL/hr over 30 Minutes Intravenous Every 6 hours 10/02/18 1632 10/03/18 0233   10/02/18 0600  ceFAZolin (ANCEF) 3 g in dextrose 5 % 50 mL IVPB     3 g 100 mL/hr over 30 Minutes Intravenous On call to O.R. 10/01/18 1030 10/02/18 1247     and started on DVT prophylaxis in the form of Xarelto.   PT and OT were ordered for total joint protocol. Discharge planning consulted to help with postop disposition and equipment needs. Patient had a good night on the evening of surgery. They started to get up OOB with therapy on POD #0. Hemovac drain was pulled without difficulty on day one. Had complaints of lower back pain in addition to left hip pain, pt with history of extensive lumbar surgery. Continued to work with therapy into POD #2. Pt was seen during rounds on  day two and was ready to go home pending progress with therapy. Dressing was changed and the incision was clean, dry, and intact with no drainage. Back pain was improving. Pt worked with therapy for two additional sessions and was meeting their goals. He was discharged to home later that day in stable condition.  Diet: Regular diet Activity: WBAT Follow-up: in 2 weeks with Dr. Wynelle Link Disposition: Home with HEP Discharged Condition: stable   Discharge Instructions    Call MD / Call 911   Complete by:  As directed    If you experience chest pain or shortness of breath, CALL 911 and be transported to the hospital emergency room.  If you develope a fever above 101  F, pus (white drainage) or increased drainage or redness at the wound, or calf pain, call your surgeon's office.   Change dressing   Complete by:  As directed    Change the dressing daily with sterile 4 x 4 inch gauze dressing and paper tape.   Constipation Prevention   Complete by:  As directed    Drink plenty of fluids.  Prune juice may be helpful.  You may use a stool softener, such as Colace (over the counter) 100 mg twice a day.  Use MiraLax (over the counter) for constipation as needed.   Diet - low sodium heart healthy   Complete by:  As directed    Discharge instructions   Complete by:  As directed    Dr. Gaynelle Arabian Total Joint Specialist [facility] [address], [place], [place] [address] [phone]  ANTERIOR APPROACH TOTAL HIP REPLACEMENT POSTOPERATIVE DIRECTIONS   Hip Rehabilitation, Guidelines Following Surgery  The results of a hip operation are greatly improved after range of motion and muscle strengthening exercises. Follow all safety measures which are given to protect your hip. If any of these exercises cause increased pain or swelling in your joint, decrease the amount until you are comfortable again. Then slowly increase the exercises. Call your caregiver if you have problems or  questions.   HOME CARE INSTRUCTIONS  Remove items at home which could result in a fall. This includes throw rugs or furniture in walking pathways.  ICE to the affected hip every three hours for 30 minutes at a time and then as needed for pain and swelling.  Continue to use ice on the hip for pain and swelling from surgery. You may notice swelling that will progress down to the foot and ankle.  This is normal after surgery.  Elevate the leg when you are not up walking on it.   Continue to use the breathing machine which will help keep your temperature down.  It is common for your temperature to cycle up and down following surgery, especially at night when you are not up moving around and exerting yourself.  The breathing machine keeps your lungs expanded and your temperature down.  DIET You may resume your previous home diet once your are discharged from the hospital.  DRESSING / WOUND CARE / SHOWERING You may shower 3 days after surgery, but keep the wounds dry during showering.  You may use an occlusive plastic wrap (Press'n Seal for example), NO SOAKING/SUBMERGING IN THE BATHTUB.  If the bandage gets wet, change with a clean dry gauze.  If the incision gets wet, pat the wound dry with a clean towel. You may start showering once you are discharged home but do not submerge the incision under water. Just pat the incision dry and apply a dry gauze dressing on daily. Change the surgical dressing daily and reapply a dry dressing each time.  ACTIVITY Walk with your walker as instructed. Use walker as long as suggested by your caregivers. Avoid periods of inactivity such as sitting longer than an hour when not asleep. This helps prevent blood clots.  You may resume a sexual relationship in one month or when given the OK by your doctor.  You may return to work once you are cleared by your doctor.  Do not drive a car for 6 weeks or until released by you surgeon.  Do not drive while taking  narcotics.  WEIGHT BEARING Weight bearing as tolerated with assist device (walker, cane, etc)  as directed, use it as long as suggested by your surgeon or therapist, typically at least 4-6 weeks.  POSTOPERATIVE CONSTIPATION PROTOCOL Constipation - defined medically as fewer than three stools per week and severe constipation as less than one stool per week.  One of the most common issues patients have following surgery is constipation.  Even if you have a regular bowel pattern at home, your normal regimen is likely to be disrupted due to multiple reasons following surgery.  Combination of anesthesia, postoperative narcotics, change in appetite and fluid intake all can affect your bowels.  In order to avoid complications following surgery, here are some recommendations in order to help you during your recovery period.  Colace (docusate) - Pick up an over-the-counter form of Colace or another stool softener and take twice a day as long as you are requiring postoperative pain medications.  Take with a full glass of water daily.  If you experience loose stools or diarrhea, hold the colace until you stool forms back up.  If your symptoms do not get better within 1 week or if they get worse, check with your doctor.  Dulcolax (bisacodyl) - Pick up over-the-counter and take as directed by the product packaging as needed to assist with the movement of your bowels.  Take with a full glass of water.  Use this product as needed if not relieved by Colace only.   MiraLax (polyethylene glycol) - Pick up over-the-counter to have on hand.  MiraLax is a solution that will increase the amount of water in your bowels to assist with bowel movements.  Take as directed and can mix with a glass of water, juice, soda, coffee, or tea.  Take if you go more than two days without a movement. Do not use MiraLax more than once per day. Call your doctor if you are still constipated or irregular after using this medication for 7 days  in a row.  If you continue to have problems with postoperative constipation, please contact the office for further assistance and recommendations.  If you experience "the worst abdominal pain ever" or develop nausea or vomiting, please contact the office immediatly for further recommendations for treatment.  ITCHING  If you experience itching with your medications, try taking only a single pain pill, or even half a pain pill at a time.  You can also use Benadryl over the counter for itching or also to help with sleep.   TED HOSE STOCKINGS Wear the elastic stockings on both legs for three weeks following surgery during the day but you may remove then at night for sleeping.  MEDICATIONS See your medication summary on the "After Visit Summary" that the nursing staff will review with you prior to discharge.  You may have some home medications which will be placed on hold until you complete the course of blood thinner medication.  It is important for you to complete the blood thinner medication as prescribed by your surgeon.  Continue your approved medications as instructed at time of discharge.  PRECAUTIONS If you experience chest pain or shortness of breath - call 911 immediately for transfer to the hospital emergency department.  If you develop a fever greater that 101 F, purulent drainage from wound, increased redness or drainage from wound, foul odor from the wound/dressing, or calf pain - CONTACT YOUR SURGEON.  FOLLOW-UP APPOINTMENTS Make sure you keep all of your appointments after your operation with your surgeon and caregivers. You should call the office at the above phone number and make an appointment for approximately two weeks after the date of your surgery or on the date instructed by your surgeon outlined in the "After Visit Summary".  RANGE OF MOTION AND STRENGTHENING EXERCISES  These exercises are designed to help you keep full  movement of your hip joint. Follow your caregiver's or physical therapist's instructions. Perform all exercises about fifteen times, three times per day or as directed. Exercise both hips, even if you have had only one joint replacement. These exercises can be done on a training (exercise) mat, on the floor, on a table or on a bed. Use whatever works the best and is most comfortable for you. Use music or television while you are exercising so that the exercises are a pleasant break in your day. This will make your life better with the exercises acting as a break in routine you can look forward to.  Lying on your back, slowly slide your foot toward your buttocks, raising your knee up off the floor. Then slowly slide your foot back down until your leg is straight again.  Lying on your back spread your legs as far apart as you can without causing discomfort.  Lying on your side, raise your upper leg and foot straight up from the floor as far as is comfortable. Slowly lower the leg and repeat.  Lying on your back, tighten up the muscle in the front of your thigh (quadriceps muscles). You can do this by keeping your leg straight and trying to raise your heel off the floor. This helps strengthen the largest muscle supporting your knee.  Lying on your back, tighten up the muscles of your buttocks both with the legs straight and with the knee bent at a comfortable angle while keeping your heel on the floor.   IF YOU ARE TRANSFERRED TO A SKILLED REHAB FACILITY If the patient is transferred to a skilled rehab facility following release from the hospital, a list of the current medications will be sent to the facility for the patient to continue.  When discharged from the skilled rehab facility, please have the facility set up the patient's [place] prior to being released. Also, the skilled facility will be responsible for providing the patient with their medications at time of release from the  facility to include their pain medication, the muscle relaxants, and their blood thinner medication. If the patient is still at the rehab facility at time of the two week follow up appointment, the skilled rehab facility will also need to assist the patient in arranging follow up appointment in our office and any transportation needs.  MAKE SURE YOU:  Understand these instructions.  Get help right away if you are not doing well or get worse.    Pick up stool softner and laxative for home use following surgery while on pain medications. Do not submerge incision under water. Please use good hand washing techniques while changing dressing each day. May shower starting three days after surgery. Please use a clean towel to pat the incision dry following showers. Continue to use ice for pain and swelling after surgery. Do not use any lotions or creams on the incision until instructed by your surgeon.   Do not sit on low chairs, stoools or toilet seats, as it may be difficult to get up from low  surfaces   Complete by:  As directed    Driving restrictions   Complete by:  As directed    No driving for two weeks   TED hose   Complete by:  As directed    Use stockings (TED hose) for three weeks on both leg(s).  You may remove them at night for sleeping.   Weight bearing as tolerated   Complete by:  As directed      Allergies as of 10/04/2018      Reactions   Morphine And Related Shortness Of Breath   Pt. Requests removal and believes they no longer have this reaction   Aspirin Other (See Comments)   No high doses   Nsaids Other (See Comments)   CAUSES STOMACH ULCERS       Medication List    STOP taking these medications   oxyCODONE-acetaminophen 5-325 MG tablet Commonly known as:  PERCOCET/ROXICET     TAKE these medications   carvedilol 3.125 MG tablet Commonly known as:  COREG Take 3.125 mg by mouth 2 (two) times daily with a meal.   clonazePAM 0.5 MG tablet Commonly known as:   KLONOPIN Take 0.5 mg by mouth 3 (three) times daily.   cyclobenzaprine 10 MG tablet Commonly known as:  FLEXERIL Take 1 tablet (10 mg total) 3 (three) times daily as needed by mouth for muscle spasms.   furosemide 20 MG tablet Commonly known as:  LASIX Take 20 mg by mouth daily.   gabapentin 300 MG capsule Commonly known as:  NEURONTIN Take 300 mg by mouth 3 (three) times daily.   HYDROcodone-acetaminophen 10-325 MG tablet Commonly known as:  NORCO Take 1 tablet by mouth every 4 (four) hours as needed for pain.   pantoprazole 40 MG tablet Commonly known as:  PROTONIX Take 40 mg by mouth daily.   rivaroxaban 10 MG Tabs tablet Commonly known as:  XARELTO Take 1 tablet (10 mg total) by mouth daily with breakfast for 19 days. Then take one 81 mg aspirin once a day for three weeks. Then discontinue aspirin.   rOPINIRole 1 MG tablet Commonly known as:  REQUIP Take 1 mg by mouth See admin instructions. Take 1 mg by mouth at bedtime. May take additional 1 mg if needed for restless leg   senna-docusate 8.6-50 MG tablet Commonly known as:  Senokot-S Take 1 tablet by mouth 2 (two) times daily. While taking strong pain meds to prevent constipation   simvastatin 40 MG tablet Commonly known as:  ZOCOR Take 40 mg by mouth every evening.            Discharge Care Instructions  (From admission, onward)         Start     Ordered   10/03/18 0000  Weight bearing as tolerated     10/03/18 0720   10/03/18 0000  Change dressing    Comments:  Change the dressing daily with sterile 4 x 4 inch gauze dressing and paper tape.   10/03/18 0720         Follow-up Information    Gaynelle Arabian, MD. Schedule an appointment as soon as possible for a visit on 10/17/2018.   Specialty:  Orthopedic Surgery Contact information: [address] [place] [address] [phone]           Signed: Theresa Duty, PA-C Orthopedic Surgery 10/09/2018, 1:44 PM

## 2019-07-25 NOTE — Patient Instructions (Addendum)
DUE TO COVID-19 ONLY ONE VISITOR IS ALLOWED TO COME WITH YOU AND STAY IN THE WAITING ROOM ONLY DURING PRE OP AND PROCEDURE DAY OF SURGERY. THE 1 VISITOR MAY VISIT WITH YOU AFTER SURGERY IN YOUR PRIVATE ROOM DURING VISITING HOURS ONLY!  YOU NEED TO HAVE A COVID 19 TEST ON Thursday 07/26/2019.  PLEASE CONTINUE THE QUARANTINE INSTRUCTIONS AS OUTLINED IN YOUR HANDOUT.                Bradley Morgan    Your procedure is scheduled on: Monday 07/30/2019   Report to [facility] Main  Entrance    Report to admitting at 11:15 AM     Call this number if you have problems the morning of surgery [phone]    Remember: Do not eat food after Midnight.   BRUSH YOUR TEETH MORNING OF SURGERY AND RINSE YOUR MOUTH OUT, NO CHEWING GUM CANDY OR MINTS.  NO SOLID FOOD AFTER MIDNIGHT THE NIGHT PRIOR TO SURGERY. NOTHING BY MOUTH EXCEPT CLEAR LIQUIDS UNTIL 10:45am .   PLEASE FINISH ENSURE DRINK PER SURGEON ORDER  WHICH NEEDS TO BE COMPLETED AT 10:45am.   CLEAR LIQUID DIET   Foods Allowed                                                                     Foods Excluded  Coffee and tea, regular and decaf                             liquids that you cannot  Plain Jell-O any favor except red or purple             see through such as: Fruit ices (not with fruit pulp)                                     milk, soups, orange juice  Iced Popsicles                                    All solid food Carbonated beverages, regular and diet                                    Cranberry, grape and apple juices Sports drinks like Gatorade Lightly seasoned clear broth or consume(fat free) Sugar, honey syrup   _____________________________________________________________________       Take these medicines the morning of surgery with A SIP OF WATER: Simvastatin (Zocor), Carvedilol (Coreg),  Gabapentin (Neurontin)  Pantoprazole (Protonix), Clonazepam (Klonipin) and Eyedrops as needed.                                 You may not have any metal on your body including hair pins and              piercings     Do not wear jewelry, colgone, lotions, powders or deodorant  Men may shave face and neck.   Do not bring valuables to the hospital. [place].  Contacts, dentures or bridgework may not be worn into surgery.  Leave suitcase in the car. After surgery it may be brought to your room.                  Please read over the following fact sheets you were given: _____________________________________________________________________             [facility] - Preparing for Surgery Before surgery, you can play an important role.  Because skin is not sterile, your skin needs to be as free of germs as possible.  You can reduce the number of germs on your skin by washing with CHG (chlorahexidine gluconate) soap before surgery.  CHG is an antiseptic cleaner which kills germs and bonds with the skin to continue killing germs even after washing. Please DO NOT use if you have an allergy to CHG or antibacterial soaps.  If your skin becomes reddened/irritated stop using the CHG and inform your nurse when you arrive at Short Stay. Do not shave (including legs and underarms) for at least 48 hours prior to the first CHG shower.  You may shave your face/neck. Please follow these instructions carefully:  1.  Shower with CHG Soap the night before surgery and the  morning of Surgery.  2.  If you choose to wash your hair, wash your hair first as usual with your  normal  shampoo.  3.  After you shampoo, rinse your hair and body thoroughly to remove the  shampoo.                             4.  Use CHG as you would any other liquid soap.  You can apply chg directly  to the skin and wash                       Gently with a scrungie or clean washcloth.  5.  Apply the CHG Soap to your body ONLY FROM THE NECK DOWN.   Do not use on face/ open                            Wound or open sores. Avoid contact with eyes, ears mouth and genitals (private parts).                       Wash face,  Genitals (private parts) with your normal soap.             6.  Wash thoroughly, paying special attention to the area where your surgery  will be performed.  7.  Thoroughly rinse your body with warm water from the neck down.  8.  DO NOT shower/wash with your normal soap after using and rinsing off  the CHG Soap.                9.  Pat yourself dry with a clean towel.            10.  Wear clean pajamas.            11.  Place clean sheets on your bed the night of your first shower and do not  sleep with pets. Day of Surgery : Do not apply any lotions/deodorants the morning of surgery.  Please wear clean clothes to the hospital/surgery center.  FAILURE TO FOLLOW THESE INSTRUCTIONS MAY RESULT IN THE CANCELLATION OF YOUR SURGERY PATIENT SIGNATURE_________________________________  NURSE SIGNATURE__________________________________  ________________________________________________________________________   Bradley Morgan  An incentive spirometer is a tool that can help keep your lungs clear and active. This tool measures how well you are filling your lungs with each breath. Taking long deep breaths may help reverse or decrease the chance of developing breathing (pulmonary) problems (especially infection) following:  A long period of time when you are unable to move or be active. BEFORE THE PROCEDURE   If the spirometer includes an indicator to show your best effort, your nurse or respiratory therapist will set it to a desired goal.  If possible, sit up straight or lean slightly forward. Try not to slouch.  Hold the incentive spirometer in an upright position. INSTRUCTIONS FOR USE  1. Sit on the edge of your bed if possible, or sit up as far as you can in bed or on a chair. 2. Hold the incentive spirometer in an upright position. 3. Breathe out  normally. 4. Place the mouthpiece in your mouth and seal your lips tightly around it. 5. Breathe in slowly and as deeply as possible, raising the piston or the ball toward the top of the column. 6. Hold your breath for 3-5 seconds or for as long as possible. Allow the piston or ball to fall to the bottom of the column. 7. Remove the mouthpiece from your mouth and breathe out normally. 8. Rest for a few seconds and repeat Steps 1 through 7 at least 10 times every 1-2 hours when you are awake. Take your time and take a few normal breaths between deep breaths. 9. The spirometer may include an indicator to show your best effort. Use the indicator as a goal to work toward during each repetition. 10. After each set of 10 deep breaths, practice coughing to be sure your lungs are clear. If you have an incision (the cut made at the time of surgery), support your incision when coughing by placing a pillow or rolled up towels firmly against it. Once you are able to get out of bed, walk around indoors and cough well. You may stop using the incentive spirometer when instructed by your caregiver.  RISKS AND COMPLICATIONS  Take your time so you do not get dizzy or light-headed.  If you are in pain, you may need to take or ask for pain medication before doing incentive spirometry. It is harder to take a deep breath if you are having pain. AFTER USE  Rest and breathe slowly and easily.  It can be helpful to keep track of a log of your progress. Your caregiver can provide you with a simple table to help with this. If you are using the spirometer at home, follow these instructions: [place] IF:   You are having difficultly using the spirometer.  You have trouble using the spirometer as often as instructed.  Your pain medication is not giving enough relief while using the spirometer.  You develop fever of 100.5 F (38.1 C) or higher. SEEK IMMEDIATE MEDICAL CARE IF:   You cough up bloody sputum  that had not been present before.  You develop fever of 102 F (38.9 C) or greater.  You develop worsening pain at or near the incision site. MAKE SURE YOU:  Understand these instructions.  Will watch your condition.  Will get help right away if you are not doing well or get worse. Document Released: 01/17/2007 Document Revised: 11/29/2011 Document Reviewed: 03/20/2007 ExitCare Patient Information 2014 ExitCare, [place].   ________________________________________________________________________  WHAT IS A BLOOD TRANSFUSION? Blood Transfusion Information  A transfusion is the replacement of blood or some of its parts. Blood is made up of multiple cells which provide different functions.  Red blood cells carry oxygen and are used for blood loss replacement.  White blood cells fight against infection.  Platelets control bleeding.  Plasma helps clot blood.  Other blood products are available for specialized needs, such as hemophilia or other clotting disorders. BEFORE THE TRANSFUSION  Who gives blood for transfusions?   Healthy volunteers who are fully evaluated to make sure their blood is safe. This is blood bank blood. Transfusion therapy is the safest it has ever been in the practice of medicine. Before blood is taken from a donor, a complete history is taken to make sure that person has no history of diseases nor engages in risky social behavior (examples are intravenous drug use or sexual activity with multiple partners). The donor's travel history is screened to minimize risk of transmitting infections, such as malaria. The donated blood is tested for signs of infectious diseases, such as HIV and hepatitis. The blood is then tested to be sure it is compatible with you in order to minimize the chance of a transfusion reaction. If you or a relative donates blood, this is often done in anticipation of surgery and is not appropriate for emergency situations. It takes many days to  process the donated blood. RISKS AND COMPLICATIONS Although transfusion therapy is very safe and saves many lives, the main dangers of transfusion include:   Getting an infectious disease.  Developing a transfusion reaction. This is an allergic reaction to something in the blood you were given. Every precaution is taken to prevent this. The decision to have a blood transfusion has been considered carefully by your caregiver before blood is given. Blood is not given unless the benefits outweigh the risks. AFTER THE TRANSFUSION  Right after receiving a blood transfusion, you will usually feel much better and more energetic. This is especially true if your red blood cells have gotten low (anemic). The transfusion raises the level of the red blood cells which carry oxygen, and this usually causes an energy increase.  The nurse administering the transfusion will monitor you carefully for complications. HOME CARE INSTRUCTIONS  No special instructions are needed after a transfusion. You may find your energy is better. Speak with your caregiver about any limitations on activity for underlying diseases you may have. SEEK MEDICAL CARE IF:   Your condition is not improving after your transfusion.  You develop redness or irritation at the intravenous (IV) site. SEEK IMMEDIATE MEDICAL CARE IF:  Any of the following symptoms occur over the next 12 hours:  Shaking chills.  You have a temperature by mouth above 102 F (38.9 C), not controlled by medicine.  Chest, back, or muscle pain.  People around you feel you are not acting correctly or are confused.  Shortness of breath or difficulty breathing.  Dizziness and fainting.  You get a rash or develop hives.  You have a decrease in urine output.  Your urine turns a dark color or changes to pink, red, or brown. Any of the following symptoms occur over the next 10 days:  You have a temperature by  mouth above 102 F (38.9 C), not controlled by  medicine.  Shortness of breath.  Weakness after normal activity.  The white part of the eye turns yellow (jaundice).  You have a decrease in the amount of urine or are urinating less often.  Your urine turns a dark color or changes to pink, red, or brown. Document Released: 09/03/2000 Document Revised: 11/29/2011 Document Reviewed: 04/22/2008 [facility] Patient Information 2014 [place], [place].  _______________________________________________________________________

## 2019-07-26 ENCOUNTER — Other Ambulatory Visit (HOSPITAL_COMMUNITY)
Admission: RE | Admit: 2019-07-26 | Discharge: 2019-07-26 | Disposition: A | Discharge status: Home / Self Care | Payer: Medicare HMO | Source: Ambulatory Visit | Attending: Orthopedic Surgery | Admitting: Orthopedic Surgery

## 2019-07-26 DIAGNOSIS — Z01812 Encounter for preprocedural laboratory examination: Secondary | ICD-10-CM | POA: Insufficient documentation

## 2019-07-26 DIAGNOSIS — Z20828 Contact with and (suspected) exposure to other viral communicable diseases: Secondary | ICD-10-CM | POA: Diagnosis not present

## 2019-07-26 NOTE — H&P (Signed)
TOTAL HIP ADMISSION H&P  Patient is admitted for right total hip arthroplasty.  Subjective:  Chief Complaint: right hip pain  HPI: Bradley Morgan, 68 y.o. male, has a history of pain and functional disability in the right hip(s) due to arthritis and patient has failed non-surgical conservative treatments for greater than 12 weeks to include activity modification.  Onset of symptoms was gradual starting 5 years ago with gradually worsening course since that time.The patient noted no past surgery on the right hip(s).  Patient currently rates pain in the right hip at 9 out of 10 with activity. Patient has worsening of pain with activity and weight bearing and pain that interfers with activities of daily living. Patient has evidence of  bone-on-bone arthritis in the right hip. There is no evidence of collapse or fracture. by imaging studies. This condition presents safety issues increasing the risk of falls. There is no current active infection.  Patient Active Problem List   Diagnosis Date Noted  . OA (osteoarthritis) of hip 10/02/2018  . Ureteral stone with hydronephrosis 09/24/2017  . 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 Complete rotator cuff tear 07/11/2014  . Acute encephalopathy 04/28/2014  . Numbness and tingling of left side of face 04/27/2014  . Weakness of right hand 04/27/2014  . Drug overdose 04/27/2014  . Obesity, unspecified 12/17/2013  . Essential hypertension, benign 12/17/2013  . Pure hypercholesterolemia 12/17/2013  . Chest pain 12/16/2013   Past Medical History:  Diagnosis Date  . Adrenal adenoma, left   . Arthritis   . Bursitis of elbow   . Complication of anesthesia    ANESTHESIA DID NOT TAKE EFFECT WITH BACK SURGERY "I FELT THEM CUT"  . Congenital absence of right kidney   . Diverticulosis of colon   . Family history of anesthesia complication    "hard time waking up my dad" many yrs ago  . Frequency of urination   . Full dentures   . GAD  (generalized anxiety disorder)   . GERD (gastroesophageal reflux disease)   . History of acute renal failure    12-16-2013;  09-24-2017 due to stone obstruction  . 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 History of encephalopathy 04/26/2014   ACUTE EPISODE SECONDARY TO DRUG OVERDOSE (NARC, BZD, FLEXERIL)  . History of GI bleed    08/ 2001   duodenal ulcer bleed post egd w/ cauterization  . History of prostatitis   . Hyperlipidemia   . Hypertension   . Left ureteral stone   . Nocturia more than twice per night   . Peripheral neuropathy    bilateral leg numbness due to back issues, walks w/ cane  . PONV (postoperative nausea and vomiting)   . Restless leg syndrome   . Solitary left kidney   . Wears glasses     Past Surgical History:  Procedure Laterality Date  . ANTERIOR CERVICAL DECOMP/DISCECTOMY FUSION  05-21-2009   dr Maia Plan [facility]   C5-6, C6-7 and aviator plate  . CARDIOVASCULAR STRESS TEST  12/17/2013   FIXED DEFECT IN THE INFEROSEPTAL REGION BUT NO STRESS INDUCED ISCHEMIA/  NORMAL LV FUNCTION AND WALL MOTION , EF 63%  . CLOSED MANIPULATION POST TOTAL KNEE ARTHROPLASTY Left 11-28-2001    dr Wynelle Link  . CYSTOSCOPY WITH RETROGRADE PYELOGRAM, URETEROSCOPY AND STENT PLACEMENT Left 10/07/2017   Procedure: CYSTOSCOPY WITH LEFT RETROGRADE PYELOGRAM, BASKETRY OF STONE, URETEROSCOPY AND STENT REMOVAL;  Surgeon: Alexis Frock, MD;  [facility];  Service: Urology;  Laterality: Left;  . CYSTOSCOPY WITH STENT PLACEMENT Left 09/24/2017   Procedure: RENAL CYSTOSCOPY LEFT RETROGRADE, LEFT URETERAL STENT PLACEMENT;  Surgeon: Alexis Frock, MD;  Location: [place];  Service: Urology;  Laterality: Left;  . HIP ARTHROSCOPY W/ LABRAL DEBRIDEMENT Left 08/12/2006   dr Wynelle Link [facility]   and chondraplasty  . INCISION AND DRAINAGE ABSCESS Right 03/06/2015   Procedure: INCISION AND DRAINAGE ABSCESS RIGHT ARM;  Surgeon:  Daryll Brod, MD;  Location: [facility];  Service: Orthopedics;  Laterality: Right;  . KNEE ARTHROSCOPY Left x2  2002  . LUMBAR SPINE SURGERY  x5  last one 2002  . SHOULDER ARTHROSCOPY WITH SUBACROMIAL DECOMPRESSION, ROTATOR CUFF REPAIR AND BICEP TENDON REPAIR Right 06-27-2002  dr Wynelle Link [facility]   labral debridement and DCR  . SHOULDER OPEN ROTATOR CUFF REPAIR Left 07/11/2014   Procedure: ROTATOR CUFF REPAIR SHOULDER OPEN WITH  GRAFT ;  Surgeon: Tobi Bastos, MD;  Location: [facility];  Service: Orthopedics;  Laterality: Left;  . TOTAL HIP ARTHROPLASTY Left 10/02/2018   Procedure: TOTAL HIP ARTHROPLASTY ANTERIOR APPROACH;  Surgeon: Gaynelle Arabian, MD;  Location: [facility];  Service: Orthopedics;  Laterality: Left;  126min  . TOTAL KNEE ARTHROPLASTY Left 10-09-2001   dr Wynelle Link [facility]  . TOTAL KNEE REVISION Left 08/03/2017   Procedure: Left knee tibia polyethylene and patella revision;  Surgeon: Gaynelle Arabian, MD;  Location: [facility];  Service: Orthopedics;  Laterality: Left;  with abductor block    No current facility-administered medications for this encounter.    Current Outpatient Medications  Medication Sig Dispense Refill Last Dose  . acetaminophen (TYLENOL) 500 MG tablet Take 1,000 mg by mouth every 6 (six) hours as needed for moderate pain or headache.     . Carboxymethylcellul-Glycerin (LUBRICATING EYE DROPS OP) Place 1 drop into both eyes daily as needed (dry eyes).     . carvedilol (COREG) 3.125 MG tablet Take 3.125 mg by mouth 2 (two) times daily with a meal.      . clonazePAM (KLONOPIN) 0.5 MG tablet Take 0.5 mg by mouth 3 (three) times daily as needed for anxiety.      . cyclobenzaprine (FLEXERIL) 10 MG tablet Take 1 tablet (10 mg total) 3 (three) times daily as needed by mouth for muscle spasms. 90 tablet 0   . furosemide (LASIX) 20 MG tablet Take 20 mg by mouth daily.     Marland Kitchen gabapentin (NEURONTIN) 300 MG capsule Take 300-600 mg by mouth See admin instructions. Take 300 mg by  mouth 3 times a day and 600 mg at bedtime as needed for pain/restless legs     . HYDROcodone-acetaminophen (NORCO) 10-325 MG tablet Take 1 tablet by mouth every 4 (four) hours as needed for pain.     . Meclizine HCl (MOTION SICKENESS RELIEF PO) Take 1 tablet by mouth daily as needed (motion sickness).     . pantoprazole (PROTONIX) 40 MG tablet Take 40 mg by mouth 2 (two) times daily.      Marland Kitchen rOPINIRole (REQUIP) 1 MG tablet Take 1 mg by mouth See admin instructions. Take 1 mg by mouth at bedtime. May take additional 1 mg if needed for restless leg     . SIMVASTATIN PO Take 1 tablet by mouth daily.     . rivaroxaban (XARELTO) 10 MG TABS tablet Take 1 tablet (10 mg total) by mouth daily with breakfast for 19 days. Then take  one 81 mg aspirin once a day for three weeks. Then discontinue aspirin. (Patient not taking: Reported on 07/23/2019) 19 tablet 0 Completed Course at Unknown time   Allergies  Allergen Reactions  . Aspirin Other (See Comments)    No high doses  . Nsaids Other (See Comments)    CAUSES STOMACH ULCERS     Social History   Tobacco Use  . Smoking status: Former Smoker    Years: 15.00    Types: Cigarettes    Quit date: 10/09/1998    Years since quitting: 20.8  . Smokeless tobacco: Never Used  Substance Use Topics  . Alcohol use: No    Family History  Problem Relation Age of Onset  . Heart failure Father   . Diabetes Father   . Hypertension Father   . Anxiety disorder Mother        also tended to overdose on anxiety medication     Review of Systems  Constitutional: Negative for chills and fever.  Respiratory: Negative for cough and shortness of breath.   Cardiovascular: Negative for chest pain and palpitations.  Gastrointestinal: Negative for nausea and vomiting.  Musculoskeletal: Positive for joint pain.    Objective:  Physical Exam Patient is a 68 year old male.  Well nourished and well developed. General: Alert and oriented x3, cooperative and pleasant, no  acute distress. Head: normocephalic, atraumatic, neck supple. Eyes: EOMI. Respiratory: breath sounds clear in all fields, no wheezing, rales, or rhonchi. Cardiovascular: Regular rate and rhythm, no murmurs, gallops or rubs. Abdomen: non-tender to palpation and soft, normoactive bowel sounds.  Musculoskeletal: Right Hip Exam: ROM: Flexion to 100, Internal Rotation 0, External Rotation 0, and Abduction 20 degrees. There is no tenderness over the greater trochanter bursa.  Calves soft and nontender. Motor function intact in LE. Strength 5/5 LE bilaterally. Neuro: Distal pulses 2+. Sensation to light touch intact in LE.  Vital signs in last 24 hours:  Labs:   Estimated body mass index is 36.81 kg/m as calculated from the following:   Height as of 10/02/18: 6\' 1"  (1.854 m).   Weight as of 10/02/18: 126.6 kg.   Imaging Review Plain radiographs demonstrate severe degenerative joint disease of the right hip(s). The bone quality appears to be adequate for age and reported activity level.  Assessment/Plan:  End stage arthritis, right hip(s)  The patient history, physical examination, clinical judgement of the provider and imaging studies are consistent with end stage degenerative joint disease of the right hip(s) and total hip arthroplasty is deemed medically necessary. The treatment options including medical management, injection therapy, arthroscopy and arthroplasty were discussed at length. The risks and benefits of total hip arthroplasty were presented and reviewed. The risks due to aseptic loosening, infection, stiffness, dislocation/subluxation,  thromboembolic complications and other imponderables were discussed.  The patient acknowledged the explanation, agreed to proceed with the plan and consent was signed. Patient is being admitted for inpatient treatment for surgery, pain control, PT, OT, prophylactic antibiotics, VTE prophylaxis, progressive ambulation and ADL's and discharge  planning.The patient is planning to be discharged home.   Therapy Plans: HEP Disposition: Home with wife Planned DVT Prophylaxis: Xarelto 10mg  daily DME needed: none PCP: Dr. Kathryne Eriksson, clearance received TXA: IV Allergies: aspirin - unknown, morphine - unknown Anesthesia Concerns: none BMI: 37.5  Other: Hydrocodone did okay with last surgery.  - Patient was instructed on what medications to stop prior to surgery. - Follow-up visit in 2 weeks with Dr. Wynelle Link - Begin physical therapy following surgery -  Pre-operative lab work as pre-surgical testing - Prescriptions will be provided in hospital at time of discharge  Griffith Citron, PA-C [facility] [place] [phone]

## 2019-07-26 NOTE — Progress Notes (Signed)
PCP - Dr. Kathryne Eriksson w/ [facility] 06/07/19  Cardiologist -   Chest x-ray -   EKG - 09-25-18   Stress Test -  ECHO -  Cardiac Cath -   Sleep Study -  CPAP -   Fasting Blood Sugar -  Checks Blood Sugar _____ times a day  Blood Thinner Instructions: Not taking Xarelto  Aspirin Instructions: Last Dose:  Anesthesia review:   Patient denies shortness of breath, fever, cough and chest pain at PAT appointment   Patient verbalized understanding of instructions that were given to them at the PAT appointment. Patient was also instructed that they will need to review over the PAT instructions again at home before surgery.

## 2019-07-27 ENCOUNTER — Encounter (HOSPITAL_COMMUNITY)
Admission: RE | Admit: 2019-07-27 | Discharge: 2019-07-27 | Disposition: A | Discharge status: Home / Self Care | Payer: Medicare HMO | Source: Ambulatory Visit | Attending: Orthopedic Surgery | Admitting: Orthopedic Surgery

## 2019-07-27 ENCOUNTER — Other Ambulatory Visit: Payer: Self-pay

## 2019-07-27 ENCOUNTER — Encounter (HOSPITAL_COMMUNITY): Payer: Self-pay

## 2019-07-27 DIAGNOSIS — Z01812 Encounter for preprocedural laboratory examination: Secondary | ICD-10-CM | POA: Diagnosis not present

## 2019-07-27 DIAGNOSIS — M1611 Unilateral primary osteoarthritis, right hip: Secondary | ICD-10-CM | POA: Insufficient documentation

## 2019-07-27 LAB — CBC WITH DIFFERENTIAL/PLATELET
Abs Immature Granulocytes: 0.01 10*3/uL (ref 0.00–0.07)
Basophils Absolute: 0.1 10*3/uL (ref 0.0–0.1)
Basophils Relative: 1 %
Eosinophils Absolute: 0.2 10*3/uL (ref 0.0–0.5)
Eosinophils Relative: 3 %
HCT: 37.6 % — ABNORMAL LOW (ref 39.0–52.0)
Hemoglobin: 10.8 g/dL — ABNORMAL LOW (ref 13.0–17.0)
Immature Granulocytes: 0 %
Lymphocytes Relative: 28 %
Lymphs Abs: 1.6 10*3/uL (ref 0.7–4.0)
MCH: 26.7 pg (ref 26.0–34.0)
MCHC: 28.7 g/dL — ABNORMAL LOW (ref 30.0–36.0)
MCV: 92.8 fL (ref 80.0–100.0)
Monocytes Absolute: 0.6 10*3/uL (ref 0.1–1.0)
Monocytes Relative: 11 %
Neutro Abs: 3.3 10*3/uL (ref 1.7–7.7)
Neutrophils Relative %: 57 %
Platelets: 219 10*3/uL (ref 150–400)
RBC: 4.05 MIL/uL — ABNORMAL LOW (ref 4.22–5.81)
RDW: 15.9 % — ABNORMAL HIGH (ref 11.5–15.5)
WBC: 5.8 10*3/uL (ref 4.0–10.5)
nRBC: 0 % (ref 0.0–0.2)

## 2019-07-27 LAB — COMPREHENSIVE METABOLIC PANEL
ALT: 16 U/L (ref 0–44)
AST: 15 U/L (ref 15–41)
Albumin: 4.1 g/dL (ref 3.5–5.0)
Alkaline Phosphatase: 76 U/L (ref 38–126)
Anion gap: 8 (ref 5–15)
BUN: 20 mg/dL (ref 8–23)
CO2: 24 mmol/L (ref 22–32)
Calcium: 9.2 mg/dL (ref 8.9–10.3)
Chloride: 107 mmol/L (ref 98–111)
Creatinine, Ser: 1.18 mg/dL (ref 0.61–1.24)
GFR calc Af Amer: >60 mL/min (ref >60)
GFR calc non Af Amer: >60 mL/min (ref >60)
Glucose, Bld: 92 mg/dL (ref 70–99)
Potassium: 5 mmol/L (ref 3.5–5.1)
Sodium: 139 mmol/L (ref 135–145)
Total Bilirubin: 0.2 mg/dL — ABNORMAL LOW (ref 0.3–1.2)
Total Protein: 6.8 g/dL (ref 6.5–8.1)

## 2019-07-27 LAB — PROTIME-INR
INR: 1 (ref 0.8–1.2)
Prothrombin Time: 12.9 seconds (ref 11.4–15.2)

## 2019-07-27 LAB — APTT: aPTT: 29 seconds (ref 24–36)

## 2019-07-27 LAB — SURGICAL PCR SCREEN
MRSA, PCR: NEGATIVE
Staphylococcus aureus: POSITIVE — AB

## 2019-07-27 NOTE — Progress Notes (Signed)
PCR result routed to Dr. Aluisio for review 

## 2019-07-28 LAB — NOVEL CORONAVIRUS, NAA (HOSP ORDER, SEND-OUT TO REF LAB; TAT 18-24 HRS): SARS-CoV-2, NAA: NOT DETECTED

## 2019-07-29 MED ORDER — DEXTROSE 5 % IV SOLN
3.0000 g | INTRAVENOUS | Status: AC
  Administered 2019-07-30: 3 g via INTRAVENOUS
  Filled 2019-07-29: qty 3

## 2019-07-30 ENCOUNTER — Ambulatory Visit (HOSPITAL_COMMUNITY): Payer: Medicare HMO | Admitting: Registered Nurse

## 2019-07-30 ENCOUNTER — Ambulatory Visit (HOSPITAL_COMMUNITY): Payer: Medicare HMO | Admitting: Physician Assistant

## 2019-07-30 ENCOUNTER — Ambulatory Visit (HOSPITAL_COMMUNITY)
Admission: RE | Admit: 2019-07-30 | Discharge: 2019-07-31 | Disposition: A | Discharge status: Home / Self Care | Payer: Medicare HMO | Attending: Orthopedic Surgery | Admitting: Orthopedic Surgery

## 2019-07-30 ENCOUNTER — Ambulatory Visit (HOSPITAL_COMMUNITY): Payer: Medicare HMO

## 2019-07-30 ENCOUNTER — Other Ambulatory Visit: Payer: Self-pay

## 2019-07-30 ENCOUNTER — Encounter (HOSPITAL_COMMUNITY): Payer: Self-pay | Admitting: *Deleted

## 2019-07-30 ENCOUNTER — Encounter (HOSPITAL_COMMUNITY)
Admission: RE | Disposition: A | Discharge status: Home / Self Care | Payer: Self-pay | Source: Home / Self Care | Attending: Orthopedic Surgery

## 2019-07-30 DIAGNOSIS — K219 Gastro-esophageal reflux disease without esophagitis: Secondary | ICD-10-CM | POA: Insufficient documentation

## 2019-07-30 DIAGNOSIS — Z419 Encounter for procedure for purposes other than remedying health state, unspecified: Secondary | ICD-10-CM

## 2019-07-30 DIAGNOSIS — M169 Osteoarthritis of hip, unspecified: Secondary | ICD-10-CM | POA: Diagnosis present

## 2019-07-30 DIAGNOSIS — M1611 Unilateral primary osteoarthritis, right hip: Secondary | ICD-10-CM

## 2019-07-30 DIAGNOSIS — Z79899 Other long term (current) drug therapy: Secondary | ICD-10-CM | POA: Diagnosis not present

## 2019-07-30 DIAGNOSIS — F411 Generalized anxiety disorder: Secondary | ICD-10-CM | POA: Insufficient documentation

## 2019-07-30 DIAGNOSIS — Z96642 Presence of left artificial hip joint: Secondary | ICD-10-CM | POA: Insufficient documentation

## 2019-07-30 DIAGNOSIS — M25551 Pain in right hip: Secondary | ICD-10-CM | POA: Diagnosis present

## 2019-07-30 DIAGNOSIS — Z87891 Personal history of nicotine dependence: Secondary | ICD-10-CM | POA: Insufficient documentation

## 2019-07-30 DIAGNOSIS — M25751 Osteophyte, right hip: Secondary | ICD-10-CM | POA: Diagnosis not present

## 2019-07-30 DIAGNOSIS — Q600 Renal agenesis, unilateral: Secondary | ICD-10-CM | POA: Insufficient documentation

## 2019-07-30 DIAGNOSIS — E785 Hyperlipidemia, unspecified: Secondary | ICD-10-CM | POA: Diagnosis not present

## 2019-07-30 DIAGNOSIS — Z96652 Presence of left artificial knee joint: Secondary | ICD-10-CM | POA: Diagnosis not present

## 2019-07-30 DIAGNOSIS — G2581 Restless legs syndrome: Secondary | ICD-10-CM | POA: Diagnosis not present

## 2019-07-30 DIAGNOSIS — Z96649 Presence of unspecified artificial hip joint: Secondary | ICD-10-CM

## 2019-07-30 DIAGNOSIS — I10 Essential (primary) hypertension: Secondary | ICD-10-CM | POA: Diagnosis not present

## 2019-07-30 HISTORY — PX: TOTAL HIP ARTHROPLASTY: SHX124

## 2019-07-30 LAB — TYPE AND SCREEN
ABO/RH(D): A POS
Antibody Screen: NEGATIVE

## 2019-07-30 SURGERY — ARTHROPLASTY, HIP, TOTAL, ANTERIOR APPROACH
Anesthesia: General | Site: Hip | Laterality: Right

## 2019-07-30 MED ORDER — PROPOFOL 10 MG/ML IV BOLUS
INTRAVENOUS | Status: AC
  Filled 2019-07-30: qty 20

## 2019-07-30 MED ORDER — DEXAMETHASONE SODIUM PHOSPHATE 10 MG/ML IJ SOLN: 8.0000 mg | Freq: Once | INTRAMUSCULAR | Status: DC

## 2019-07-30 MED ORDER — SODIUM CHLORIDE (PF) 0.9 % IJ SOLN
INTRAMUSCULAR | Status: AC
  Filled 2019-07-30: qty 50

## 2019-07-30 MED ORDER — MIDAZOLAM HCL 2 MG/2ML IJ SOLN
INTRAMUSCULAR | Status: AC
  Filled 2019-07-30: qty 2

## 2019-07-30 MED ORDER — ALBUTEROL SULFATE (2.5 MG/3ML) 0.083% IN NEBU
INHALATION_SOLUTION | RESPIRATORY_TRACT | Status: AC
  Filled 2019-07-30: qty 3

## 2019-07-30 MED ORDER — MENTHOL 3 MG MT LOZG: 1.0000 | LOZENGE | OROMUCOSAL | Status: DC | PRN

## 2019-07-30 MED ORDER — RIVAROXABAN 10 MG PO TABS
10.0000 mg | ORAL_TABLET | Freq: Every day | ORAL | Status: DC
  Administered 2019-07-31: 10 mg via ORAL
  Filled 2019-07-30: qty 1

## 2019-07-30 MED ORDER — PROMETHAZINE HCL 25 MG/ML IJ SOLN: 6.2500 mg | INTRAMUSCULAR | Status: DC | PRN

## 2019-07-30 MED ORDER — PHENYLEPHRINE 40 MCG/ML (10ML) SYRINGE FOR IV PUSH (FOR BLOOD PRESSURE SUPPORT)
PREFILLED_SYRINGE | INTRAVENOUS | Status: DC | PRN
  Administered 2019-07-30: 80 ug via INTRAVENOUS

## 2019-07-30 MED ORDER — BUPIVACAINE HCL (PF) 0.25 % IJ SOLN
INTRAMUSCULAR | Status: AC
  Filled 2019-07-30: qty 30

## 2019-07-30 MED ORDER — ONDANSETRON HCL 4 MG PO TABS: 4.0000 mg | ORAL_TABLET | Freq: Four times a day (QID) | ORAL | Status: DC | PRN

## 2019-07-30 MED ORDER — ACETAMINOPHEN 10 MG/ML IV SOLN: 1000.0000 mg | Freq: Once | INTRAVENOUS | Status: DC | PRN

## 2019-07-30 MED ORDER — METHOCARBAMOL 500 MG IVPB - SIMPLE MED
INTRAVENOUS | Status: AC
  Filled 2019-07-30: qty 50

## 2019-07-30 MED ORDER — MEPERIDINE HCL 50 MG/ML IJ SOLN: 6.2500 mg | INTRAMUSCULAR | Status: DC | PRN

## 2019-07-30 MED ORDER — PHENYLEPHRINE 40 MCG/ML (10ML) SYRINGE FOR IV PUSH (FOR BLOOD PRESSURE SUPPORT)
PREFILLED_SYRINGE | INTRAVENOUS | Status: AC
  Filled 2019-07-30: qty 10

## 2019-07-30 MED ORDER — FLEET ENEMA 7-19 GM/118ML RE ENEM: 1.0000 | ENEMA | Freq: Once | RECTAL | Status: DC | PRN

## 2019-07-30 MED ORDER — ONDANSETRON HCL 4 MG/2ML IJ SOLN: 4.0000 mg | Freq: Four times a day (QID) | INTRAMUSCULAR | Status: DC | PRN

## 2019-07-30 MED ORDER — CEFAZOLIN SODIUM-DEXTROSE 2-4 GM/100ML-% IV SOLN
2.0000 g | Freq: Four times a day (QID) | INTRAVENOUS | Status: AC
  Administered 2019-07-30 – 2019-07-31 (×2): 2 g via INTRAVENOUS
  Filled 2019-07-30 (×2): qty 100

## 2019-07-30 MED ORDER — BISACODYL 10 MG RE SUPP: 10.0000 mg | Freq: Every day | RECTAL | Status: DC | PRN

## 2019-07-30 MED ORDER — LIDOCAINE 2% (20 MG/ML) 5 ML SYRINGE
INTRAMUSCULAR | Status: AC
  Filled 2019-07-30: qty 5

## 2019-07-30 MED ORDER — PROPOFOL 10 MG/ML IV BOLUS
INTRAVENOUS | Status: DC | PRN
  Administered 2019-07-30: 130 mg via INTRAVENOUS

## 2019-07-30 MED ORDER — SUGAMMADEX SODIUM 500 MG/5ML IV SOLN
INTRAVENOUS | Status: DC | PRN
  Administered 2019-07-30: 260 mg via INTRAVENOUS

## 2019-07-30 MED ORDER — HYDROMORPHONE HCL 1 MG/ML IJ SOLN
0.2500 mg | INTRAMUSCULAR | Status: DC | PRN
  Administered 2019-07-30 (×3): 0.5 mg via INTRAVENOUS

## 2019-07-30 MED ORDER — ACETAMINOPHEN 500 MG PO TABS
500.0000 mg | ORAL_TABLET | Freq: Four times a day (QID) | ORAL | Status: DC
  Administered 2019-07-30 – 2019-07-31 (×2): 500 mg via ORAL
  Filled 2019-07-30 (×3): qty 1

## 2019-07-30 MED ORDER — 0.9 % SODIUM CHLORIDE (POUR BTL) OPTIME
TOPICAL | Status: DC | PRN
  Administered 2019-07-30: 15:00:00 1000 mL

## 2019-07-30 MED ORDER — PANTOPRAZOLE SODIUM 40 MG PO TBEC
40.0000 mg | DELAYED_RELEASE_TABLET | Freq: Two times a day (BID) | ORAL | Status: DC
  Administered 2019-07-30 – 2019-07-31 (×2): 40 mg via ORAL
  Filled 2019-07-30 (×2): qty 1

## 2019-07-30 MED ORDER — HYDROCODONE-ACETAMINOPHEN 7.5-325 MG PO TABS
1.0000 | ORAL_TABLET | ORAL | Status: DC | PRN
  Administered 2019-07-30 – 2019-07-31 (×4): 2 via ORAL
  Filled 2019-07-30: qty 2
  Filled 2019-07-30: qty 1
  Filled 2019-07-30 (×4): qty 2

## 2019-07-30 MED ORDER — PHENOL 1.4 % MT LIQD: 1.0000 | OROMUCOSAL | Status: DC | PRN

## 2019-07-30 MED ORDER — BUPIVACAINE HCL 0.25 % IJ SOLN
INTRAMUSCULAR | Status: DC | PRN
  Administered 2019-07-30: 30 mL via INTRA_ARTICULAR

## 2019-07-30 MED ORDER — METOCLOPRAMIDE HCL 5 MG PO TABS: 5.0000 mg | ORAL_TABLET | Freq: Three times a day (TID) | ORAL | Status: DC | PRN

## 2019-07-30 MED ORDER — CHLORHEXIDINE GLUCONATE 4 % EX LIQD: 60.0000 mL | Freq: Once | CUTANEOUS | Status: DC

## 2019-07-30 MED ORDER — GABAPENTIN 300 MG PO CAPS
300.0000 mg | ORAL_CAPSULE | Freq: Three times a day (TID) | ORAL | Status: DC | PRN
  Administered 2019-07-30 – 2019-07-31 (×2): 300 mg via ORAL
  Filled 2019-07-30 (×2): qty 1

## 2019-07-30 MED ORDER — POLYETHYLENE GLYCOL 3350 17 G PO PACK: 17.0000 g | PACK | Freq: Every day | ORAL | Status: DC | PRN

## 2019-07-30 MED ORDER — DEXAMETHASONE SODIUM PHOSPHATE 10 MG/ML IJ SOLN
INTRAMUSCULAR | Status: AC
  Filled 2019-07-30: qty 1

## 2019-07-30 MED ORDER — ROPINIROLE HCL 1 MG PO TABS
1.0000 mg | ORAL_TABLET | Freq: Every day | ORAL | Status: DC
  Administered 2019-07-30: 1 mg via ORAL
  Filled 2019-07-30 (×2): qty 1

## 2019-07-30 MED ORDER — HYDROCODONE-ACETAMINOPHEN 5-325 MG PO TABS
1.0000 | ORAL_TABLET | ORAL | Status: DC | PRN
  Administered 2019-07-30: 2 via ORAL
  Filled 2019-07-30: qty 2

## 2019-07-30 MED ORDER — CARVEDILOL 3.125 MG PO TABS
3.1250 mg | ORAL_TABLET | Freq: Two times a day (BID) | ORAL | Status: DC
  Administered 2019-07-30 – 2019-07-31 (×2): 3.125 mg via ORAL
  Filled 2019-07-30 (×2): qty 1

## 2019-07-30 MED ORDER — PROPOFOL 500 MG/50ML IV EMUL
INTRAVENOUS | Status: AC
  Filled 2019-07-30: qty 50

## 2019-07-30 MED ORDER — METHOCARBAMOL 500 MG PO TABS
500.0000 mg | ORAL_TABLET | Freq: Four times a day (QID) | ORAL | Status: DC | PRN
  Administered 2019-07-30 – 2019-07-31 (×3): 500 mg via ORAL
  Filled 2019-07-30 (×3): qty 1

## 2019-07-30 MED ORDER — DEXAMETHASONE SODIUM PHOSPHATE 10 MG/ML IJ SOLN
10.0000 mg | Freq: Once | INTRAMUSCULAR | Status: AC
  Administered 2019-07-31: 10 mg via INTRAVENOUS
  Filled 2019-07-30: qty 1

## 2019-07-30 MED ORDER — ACETAMINOPHEN 160 MG/5ML PO SOLN: 325.0000 mg | Freq: Once | ORAL | Status: DC | PRN

## 2019-07-30 MED ORDER — ALBUTEROL SULFATE (2.5 MG/3ML) 0.083% IN NEBU
2.5000 mg | INHALATION_SOLUTION | Freq: Once | RESPIRATORY_TRACT | Status: AC
  Administered 2019-07-30: 2.5 mg via RESPIRATORY_TRACT

## 2019-07-30 MED ORDER — DIPHENHYDRAMINE HCL 12.5 MG/5ML PO ELIX: 12.5000 mg | ORAL_SOLUTION | ORAL | Status: DC | PRN

## 2019-07-30 MED ORDER — LIDOCAINE 2% (20 MG/ML) 5 ML SYRINGE
INTRAMUSCULAR | Status: DC | PRN
  Administered 2019-07-30: 60 mg via INTRAVENOUS

## 2019-07-30 MED ORDER — POVIDONE-IODINE 10 % EX SWAB
2.0000 "application " | Freq: Once | CUTANEOUS | Status: AC
  Administered 2019-07-30: 2 via TOPICAL

## 2019-07-30 MED ORDER — BUPIVACAINE LIPOSOME 1.3 % IJ SUSP
10.0000 mL | Freq: Once | INTRAMUSCULAR | Status: DC
  Filled 2019-07-30: qty 10

## 2019-07-30 MED ORDER — ROCURONIUM BROMIDE 10 MG/ML (PF) SYRINGE
PREFILLED_SYRINGE | INTRAVENOUS | Status: AC
  Filled 2019-07-30: qty 10

## 2019-07-30 MED ORDER — CYCLOBENZAPRINE HCL 10 MG PO TABS: 10.0000 mg | ORAL_TABLET | Freq: Three times a day (TID) | ORAL | Status: DC | PRN

## 2019-07-30 MED ORDER — SODIUM CHLORIDE 0.9 % IV SOLN
INTRAVENOUS | Status: DC
  Administered 2019-07-30: 18:00:00 via INTRAVENOUS

## 2019-07-30 MED ORDER — SUGAMMADEX SODIUM 500 MG/5ML IV SOLN
INTRAVENOUS | Status: AC
  Filled 2019-07-30: qty 5

## 2019-07-30 MED ORDER — METHOCARBAMOL 500 MG IVPB - SIMPLE MED
500.0000 mg | Freq: Four times a day (QID) | INTRAVENOUS | Status: DC | PRN
  Administered 2019-07-30: 500 mg via INTRAVENOUS
  Filled 2019-07-30: qty 50

## 2019-07-30 MED ORDER — ACETAMINOPHEN 325 MG PO TABS: 325.0000 mg | ORAL_TABLET | Freq: Once | ORAL | Status: DC | PRN

## 2019-07-30 MED ORDER — ACETAMINOPHEN 10 MG/ML IV SOLN
1000.0000 mg | Freq: Four times a day (QID) | INTRAVENOUS | Status: DC
  Administered 2019-07-30: 1000 mg via INTRAVENOUS
  Filled 2019-07-30 (×2): qty 100

## 2019-07-30 MED ORDER — LACTATED RINGERS IV SOLN: INTRAVENOUS | Status: DC

## 2019-07-30 MED ORDER — CLONAZEPAM 0.5 MG PO TABS: 0.5000 mg | ORAL_TABLET | Freq: Three times a day (TID) | ORAL | Status: DC | PRN

## 2019-07-30 MED ORDER — MORPHINE SULFATE (PF) 2 MG/ML IV SOLN
0.5000 mg | INTRAVENOUS | Status: DC | PRN
  Administered 2019-07-30 – 2019-07-31 (×4): 1 mg via INTRAVENOUS
  Filled 2019-07-30 (×4): qty 1

## 2019-07-30 MED ORDER — DEXAMETHASONE SODIUM PHOSPHATE 10 MG/ML IJ SOLN
INTRAMUSCULAR | Status: DC | PRN
  Administered 2019-07-30: 8 mg via INTRAVENOUS

## 2019-07-30 MED ORDER — FENTANYL CITRATE (PF) 100 MCG/2ML IJ SOLN
INTRAMUSCULAR | Status: AC
  Filled 2019-07-30: qty 2

## 2019-07-30 MED ORDER — ONDANSETRON HCL 4 MG/2ML IJ SOLN
INTRAMUSCULAR | Status: DC | PRN
  Administered 2019-07-30: 4 mg via INTRAVENOUS

## 2019-07-30 MED ORDER — HYDROMORPHONE HCL 1 MG/ML IJ SOLN
INTRAMUSCULAR | Status: AC
  Filled 2019-07-30: qty 2

## 2019-07-30 MED ORDER — FENTANYL CITRATE (PF) 250 MCG/5ML IJ SOLN
INTRAMUSCULAR | Status: AC
  Filled 2019-07-30: qty 5

## 2019-07-30 MED ORDER — WATER FOR IRRIGATION, STERILE IR SOLN
Status: DC | PRN
  Administered 2019-07-30: 2000 mL

## 2019-07-30 MED ORDER — TRANEXAMIC ACID-NACL 1000-0.7 MG/100ML-% IV SOLN
1000.0000 mg | INTRAVENOUS | Status: AC
  Administered 2019-07-30: 1000 mg via INTRAVENOUS
  Filled 2019-07-30: qty 100

## 2019-07-30 MED ORDER — SODIUM CHLORIDE (PF) 0.9 % IJ SOLN
INTRAMUSCULAR | Status: AC
  Filled 2019-07-30: qty 10

## 2019-07-30 MED ORDER — FENTANYL CITRATE (PF) 100 MCG/2ML IJ SOLN
INTRAMUSCULAR | Status: DC | PRN
  Administered 2019-07-30 (×4): 50 ug via INTRAVENOUS

## 2019-07-30 MED ORDER — ONDANSETRON HCL 4 MG/2ML IJ SOLN
INTRAMUSCULAR | Status: AC
  Filled 2019-07-30: qty 2

## 2019-07-30 MED ORDER — FUROSEMIDE 20 MG PO TABS
20.0000 mg | ORAL_TABLET | Freq: Every day | ORAL | Status: DC
  Administered 2019-07-31: 20 mg via ORAL
  Filled 2019-07-30: qty 1

## 2019-07-30 MED ORDER — MIDAZOLAM HCL 5 MG/5ML IJ SOLN
INTRAMUSCULAR | Status: DC | PRN
  Administered 2019-07-30: 2 mg via INTRAVENOUS

## 2019-07-30 MED ORDER — ROCURONIUM BROMIDE 10 MG/ML (PF) SYRINGE
PREFILLED_SYRINGE | INTRAVENOUS | Status: DC | PRN
  Administered 2019-07-30: 20 mg via INTRAVENOUS
  Administered 2019-07-30: 50 mg via INTRAVENOUS

## 2019-07-30 MED ORDER — DOCUSATE SODIUM 100 MG PO CAPS
100.0000 mg | ORAL_CAPSULE | Freq: Two times a day (BID) | ORAL | Status: DC
  Administered 2019-07-30 – 2019-07-31 (×2): 100 mg via ORAL
  Filled 2019-07-30 (×2): qty 1

## 2019-07-30 MED ORDER — METOCLOPRAMIDE HCL 5 MG/ML IJ SOLN: 5.0000 mg | Freq: Three times a day (TID) | INTRAMUSCULAR | Status: DC | PRN

## 2019-07-30 MED ORDER — LACTATED RINGERS IV SOLN
INTRAVENOUS | Status: DC
  Administered 2019-07-30 (×2): via INTRAVENOUS

## 2019-07-30 SURGICAL SUPPLY — 47 items
BAG DECANTER FOR FLEXI CONT (MISCELLANEOUS) ×1 IMPLANT
BAG SPEC THK2 15X12 ZIP CLS (MISCELLANEOUS) ×1
BAG ZIPLOCK 12X15 (MISCELLANEOUS) ×1 IMPLANT
BLADE SAG 18X100X1.27 (BLADE) ×2 IMPLANT
COVER PERINEAL POST (MISCELLANEOUS) ×2 IMPLANT
COVER SURGICAL LIGHT HANDLE (MISCELLANEOUS) ×2 IMPLANT
COVER WAND RF STERILE (DRAPES) IMPLANT
CUP ACETBLR 54 OD PINNACLE (Hips) ×1 IMPLANT
DECANTER SPIKE VIAL GLASS SM (MISCELLANEOUS) ×2 IMPLANT
DRAPE STERI IOBAN 125X83 (DRAPES) ×2 IMPLANT
DRAPE U-SHAPE 47X51 STRL (DRAPES) ×4 IMPLANT
DRSG ADAPTIC 3X8 NADH LF (GAUZE/BANDAGES/DRESSINGS) ×2 IMPLANT
DRSG MEPILEX BORDER 4X4 (GAUZE/BANDAGES/DRESSINGS) ×2 IMPLANT
DRSG MEPILEX BORDER 4X8 (GAUZE/BANDAGES/DRESSINGS) ×2 IMPLANT
DURAPREP 26ML APPLICATOR (WOUND CARE) ×2 IMPLANT
ELECT REM PT RETURN 15FT ADLT (MISCELLANEOUS) ×2 IMPLANT
EVACUATOR 1/8 PVC DRAIN (DRAIN) ×2 IMPLANT
GLOVE BIO SURGEON STRL SZ 6 (GLOVE) IMPLANT
GLOVE BIO SURGEON STRL SZ7 (GLOVE) IMPLANT
GLOVE BIO SURGEON STRL SZ8 (GLOVE) ×2 IMPLANT
GLOVE BIOGEL PI IND STRL 6.5 (GLOVE) IMPLANT
GLOVE BIOGEL PI IND STRL 7.0 (GLOVE) IMPLANT
GLOVE BIOGEL PI IND STRL 8 (GLOVE) ×1 IMPLANT
GLOVE BIOGEL PI INDICATOR 6.5 (GLOVE)
GLOVE BIOGEL PI INDICATOR 7.0 (GLOVE)
GLOVE BIOGEL PI INDICATOR 8 (GLOVE) ×1
GOWN STRL REUS W/TWL LRG LVL3 (GOWN DISPOSABLE) ×2 IMPLANT
GOWN STRL REUS W/TWL XL LVL3 (GOWN DISPOSABLE) IMPLANT
HEAD CERAMIC 36 PLUS5 (Hips) ×1 IMPLANT
HOLDER FOLEY CATH W/STRAP (MISCELLANEOUS) ×1 IMPLANT
KIT TURNOVER KIT A (KITS) IMPLANT
LINER MARATHON NEUT +4X54X36 (Hips) ×1 IMPLANT
MANIFOLD NEPTUNE II (INSTRUMENTS) ×2 IMPLANT
PACK ANTERIOR HIP CUSTOM (KITS) ×2 IMPLANT
PENCIL SMOKE EVAC ROCKERSWITCH (MISCELLANEOUS) ×1 IMPLANT
SLEEVE SUCTION CATH 165 (SLEEVE) ×1 IMPLANT
STEM FEM ACTIS HIGH SZ8 (Stem) ×1 IMPLANT
STRIP CLOSURE SKIN 1/2X4 (GAUZE/BANDAGES/DRESSINGS) ×3 IMPLANT
SUT ETHIBOND NAB CT1 #1 30IN (SUTURE) ×2 IMPLANT
SUT MNCRL AB 4-0 PS2 18 (SUTURE) ×2 IMPLANT
SUT STRATAFIX 0 PDS 27 VIOLET (SUTURE) ×2
SUT VIC AB 2-0 CT1 27 (SUTURE) ×4
SUT VIC AB 2-0 CT1 TAPERPNT 27 (SUTURE) ×2 IMPLANT
SUTURE STRATFX 0 PDS 27 VIOLET (SUTURE) ×1 IMPLANT
SYR 50ML LL SCALE MARK (SYRINGE) IMPLANT
TRAY FOLEY MTR SLVR 16FR STAT (SET/KITS/TRAYS/PACK) ×1 IMPLANT
YANKAUER SUCT BULB TIP 10FT TU (MISCELLANEOUS) ×2 IMPLANT

## 2019-07-30 NOTE — Transfer of Care (Signed)
Immediate Anesthesia Transfer of Care Note  Patient: Bradley Morgan  Procedure(s) Performed: TOTAL HIP ARTHROPLASTY ANTERIOR APPROACH (Right Hip)  Patient Location: PACU  Anesthesia Type:General  Level of Consciousness: drowsy and patient cooperative  Airway & Oxygen Therapy: Patient Spontanous Breathing and Patient connected to face mask oxygen  Post-op Assessment: Report given to RN and Post -op Vital signs reviewed and stable  Post vital signs: Reviewed and stable  Last Vitals:  Vitals Value Taken Time  BP 127/84 07/30/19 1538  Temp    Pulse 77 07/30/19 1542  Resp 17 07/30/19 1542  SpO2 98 % 07/30/19 1542  Vitals shown include unvalidated device data.  Last Pain:  Vitals:   07/30/19 1119  TempSrc: Oral  PainSc:       Patients Stated Pain Goal: 4 ([phone])  Complications: No apparent anesthesia complications

## 2019-07-30 NOTE — Anesthesia Postprocedure Evaluation (Signed)
Anesthesia Post Note  Patient: Bradley Morgan  Procedure(s) Performed: TOTAL HIP ARTHROPLASTY ANTERIOR APPROACH (Right Hip)     Patient location during evaluation: PACU Anesthesia Type: General Level of consciousness: awake and alert Pain management: pain level controlled Vital Signs Assessment: post-procedure vital signs reviewed and stable Respiratory status: spontaneous breathing, nonlabored ventilation, respiratory function stable and patient connected to nasal cannula oxygen Cardiovascular status: blood pressure returned to baseline and stable Postop Assessment: no apparent nausea or vomiting Anesthetic complications: no    Last Vitals:  Vitals:   07/30/19 1658 07/30/19 1710  BP: 133/83 139/79  Pulse: 85 83  Resp: 19 17  Temp: 36.8 C 36.9 C  SpO2: 95% 90%    Last Pain:  Vitals:   07/30/19 1710  TempSrc: Oral  PainSc: Asleep                 Jerrika Ledlow,W. EDMOND

## 2019-07-30 NOTE — Anesthesia Preprocedure Evaluation (Addendum)
Anesthesia Evaluation  Patient identified by MRN, date of birth, ID band Patient awake    Reviewed: Allergy & Precautions, NPO status , Patient's Chart, lab work & pertinent test results  History of Anesthesia Complications (+) PONV  Airway Mallampati: I  TM Distance: >3 FB Neck ROM: Full    Dental  (+) Edentulous Upper, Edentulous Lower   Pulmonary former smoker,     + decreased breath sounds      Cardiovascular hypertension, Pt. on home beta blockers and Pt. on medications  Rhythm:Regular Rate:Normal     Neuro/Psych Anxiety  Neuromuscular disease    GI/Hepatic Neg liver ROS, GERD  Medicated,  Endo/Other  negative endocrine ROS  Renal/GU Renal InsufficiencyRenal disease     Musculoskeletal  (+) Arthritis ,   Abdominal (+) + obese,   Peds  Hematology negative hematology ROS (+)   Anesthesia Other Findings   Reproductive/Obstetrics                            Anesthesia Physical Anesthesia Plan  ASA: III  Anesthesia Plan: General   Post-op Pain Management:    Induction: Intravenous  PONV Risk Score and Plan: 4 or greater and Ondansetron, Dexamethasone and Treatment may vary due to age or medical condition  Airway Management Planned: Oral ETT  Additional Equipment: None  Intra-op Plan:   Post-operative Plan: Extubation in OR  Informed Consent: I have reviewed the patients History and Physical, chart, labs and discussed the procedure including the risks, benefits and alternatives for the proposed anesthesia with the patient or authorized representative who has indicated his/her understanding and acceptance.     Dental advisory given  Plan Discussed with: CRNA  Anesthesia Plan Comments: (Pt declined spinal due to previous back surgery and traumatic spinal taps in the past. )        Anesthesia Quick Evaluation

## 2019-07-30 NOTE — Op Note (Signed)
OPERATIVE REPORT- TOTAL HIP ARTHROPLASTY   PREOPERATIVE DIAGNOSIS: Osteoarthritis of the Right hip.   POSTOPERATIVE DIAGNOSIS: Osteoarthritis of the Right  hip.   PROCEDURE: Right total hip arthroplasty, anterior approach.   SURGEON: Gaynelle Arabian, MD   ASSISTANT: Ardeen Jourdain, PA-C  ANESTHESIA:  General  ESTIMATED BLOOD LOSS:-500 mL    DRAINS: Hemovac x1.   COMPLICATIONS: None   CONDITION: PACU - hemodynamically stable.   BRIEF CLINICAL NOTE: Bradley Morgan is a 68 y.o. male who has advanced end-  stage arthritis of their Right  hip with progressively worsening pain and  dysfunction.The patient has failed nonoperative management and presents for  total hip arthroplasty.   PROCEDURE IN DETAIL: After successful administration of spinal  anesthetic, the traction boots for the [facility] bed were placed on both  feet and the patient was placed onto the [facility] bed, boots placed into the leg  holders. The Right hip was then isolated from the perineum with plastic  drapes and prepped and draped in the usual sterile fashion. ASIS and  greater trochanter were marked and a oblique incision was made, starting  at about 1 cm lateral and 2 cm distal to the ASIS and coursing towards  the anterior cortex of the femur. The skin was cut with a 10 blade  through subcutaneous tissue to the level of the fascia overlying the  tensor fascia lata muscle. The fascia was then incised in line with the  incision at the junction of the anterior third and posterior 2/3rd. The  muscle was teased off the fascia and then the interval between the TFL  and the rectus was developed. The Hohmann retractor was then placed at  the top of the femoral neck over the capsule. The vessels overlying the  capsule were cauterized and the fat on top of the capsule was removed.  A Hohmann retractor was then placed anterior underneath the rectus  femoris to give exposure to the entire anterior capsule. A T-shaped   capsulotomy was performed. The edges were tagged and the femoral head  was identified.       Osteophytes are removed off the superior acetabulum.  The femoral neck was then cut in situ with an oscillating saw. Traction  was then applied to the left lower extremity utilizing the [facility]  traction. The femoral head was then removed. Retractors were placed  around the acetabulum and then circumferential removal of the labrum was  performed. Osteophytes were also removed. Reaming starts at 49 mm to  medialize and  Increased in 2 mm increments to 53 mm. We reamed in  approximately 40 degrees of abduction, 20 degrees anteversion. A 54 mm  pinnacle acetabular shell was then impacted in anatomic position under  fluoroscopic guidance with excellent purchase. We did not need to place  any additional dome screws. A 36 mm neutral + 4 marathon liner was then  placed into the acetabular shell.       The femoral lift was then placed along the lateral aspect of the femur  just distal to the vastus ridge. The leg was  externally rotated and capsule  was stripped off the inferior aspect of the femoral neck down to the  level of the lesser trochanter, this was done with electrocautery. The femur was lifted after this was performed. The  leg was then placed in an extended and adducted position essentially delivering the femur. We also removed the capsule superiorly and the piriformis from the piriformis  fossa to gain excellent exposure of the  proximal femur. Rongeur was used to remove some cancellous bone to get  into the lateral portion of the proximal femur for placement of the  initial starter reamer. The starter broaches was placed  the starter broach  and was shown to go down the center of the canal. Broaching  with the Actis system was then performed starting at size 0  coursing  Up to size 8. A size 8 had excellent torsional and rotational  and axial stability. The trial high offset neck was then placed   with a 38 + 5 trial head. The hip was then reduced. We confirmed that  the stem was in the canal both on AP and lateral x-rays. It also has excellent sizing. The hip was reduced with outstanding stability through full extension and full external rotation.. AP pelvis was taken and the leg lengths were measured and found to be equal. Hip was then dislocated again and the femoral head and neck removed. The  femoral broach was removed. Size 8 Actis stem with a high offset  neck was then impacted into the femur following native anteversion. Has  excellent purchase in the canal. Excellent torsional and rotational and  axial stability. It is confirmed to be in the canal on AP and lateral  fluoroscopic views. The 36 + 5 ceramic head was placed and the hip  reduced with outstanding stability. Again AP pelvis was taken and it  confirmed that the leg lengths were equal. The wound was then copiously  irrigated with saline solution and the capsule reattached and repaired  with Ethibond suture. 30 ml of .25% Bupivicaine was  injected into the capsule and into the edge of the tensor fascia lata as well as subcutaneous tissue. The fascia overlying the tensor fascia lata was then closed with a running #1 V-Loc. Subcu was closed with interrupted 2-0 Vicryl and subcuticular running 4-0 Monocryl. Incision was cleaned  and dried. Steri-Strips and a bulky sterile dressing applied. Hemovac  drain was hooked to suction and then the patient was awakened and transported to  recovery in stable condition.        Please note that a surgical assistant was a medical necessity for this procedure to perform it in a safe and expeditious manner. Assistant was necessary to provide appropriate retraction of vital neurovascular structures and to prevent femoral fracture and allow for anatomic placement of the prosthesis.  Gaynelle Arabian, M.D.

## 2019-07-30 NOTE — Discharge Instructions (Signed)
°Dr. Frank Aluisio °Total Joint Specialist °[facility] °[address] °[place], [place] [address] °[phone] ° °ANTERIOR APPROACH TOTAL HIP REPLACEMENT POSTOPERATIVE DIRECTIONS ° ° °Hip Rehabilitation, Guidelines Following Surgery  °The results of a hip operation are greatly improved after range of motion and muscle strengthening exercises. Follow all safety measures which are given to protect your hip. If any of these exercises cause increased pain or swelling in your joint, decrease the amount until you are comfortable again. Then slowly increase the exercises. Call your caregiver if you have problems or questions.  ° °HOME CARE INSTRUCTIONS  °• Remove items at home which could result in a fall. This includes throw rugs or furniture in walking pathways.  °· ICE to the affected hip every three hours for 30 minutes at a time and then as needed for pain and swelling.  Continue to use ice on the hip for pain and swelling from surgery. You may notice swelling that will progress down to the foot and ankle.  This is normal after surgery.  Elevate the leg when you are not up walking on it.   °· Continue to use the breathing machine which will help keep your temperature down.  It is common for your temperature to cycle up and down following surgery, especially at night when you are not up moving around and exerting yourself.  The breathing machine keeps your lungs expanded and your temperature down. ° °DIET °You may resume your previous home diet once your are discharged from the hospital. ° °DRESSING / WOUND CARE / SHOWERING °You may shower 3 days after surgery, but keep the wounds dry during showering.  You may use an occlusive plastic wrap (Press'n Seal for example), NO SOAKING/SUBMERGING IN THE BATHTUB.  If the bandage gets wet, change with a clean dry gauze.  If the incision gets wet, pat the wound dry with a clean towel. °You may start showering once you are discharged home but do not submerge the  incision under water. Just pat the incision dry and apply a dry gauze dressing on daily. °Change the surgical dressing daily and reapply a dry dressing each time. ° °ACTIVITY °Walk with your walker as instructed. °Use walker as long as suggested by your caregivers. °Avoid periods of inactivity such as sitting longer than an hour when not asleep. This helps prevent blood clots.  °You may resume a sexual relationship in one month or when given the OK by your doctor.  °You may return to work once you are cleared by your doctor.  °Do not drive a car for 6 weeks or until released by you surgeon.  °Do not drive while taking narcotics. ° °WEIGHT BEARING °Weight bearing as tolerated with assist device (walker, cane, etc) as directed, use it as long as suggested by your surgeon or therapist, typically at least 4-6 weeks. ° °POSTOPERATIVE CONSTIPATION PROTOCOL °Constipation - defined medically as fewer than three stools per week and severe constipation as less than one stool per week. ° °One of the most common issues patients have following surgery is constipation.  Even if you have a regular bowel pattern at home, your normal regimen is likely to be disrupted due to multiple reasons following surgery.  Combination of anesthesia, postoperative narcotics, change in appetite and fluid intake all can affect your bowels.  In order to avoid complications following surgery, here are some recommendations in order to help you during your recovery period. ° °Colace (docusate) - Pick up an over-the-counter form   of Colace or another stool softener and take twice a day as long as you are requiring postoperative pain medications.  Take with a full glass of water daily.  If you experience loose stools or diarrhea, hold the colace until you stool forms back up.  If your symptoms do not get better within 1 week or if they get worse, check with your doctor. ° °Dulcolax (bisacodyl) - Pick up over-the-counter and take as directed by the product  packaging as needed to assist with the movement of your bowels.  Take with a full glass of water.  Use this product as needed if not relieved by Colace only.  ° °MiraLax (polyethylene glycol) - Pick up over-the-counter to have on hand.  MiraLax is a solution that will increase the amount of water in your bowels to assist with bowel movements.  Take as directed and can mix with a glass of water, juice, soda, coffee, or tea.  Take if you go more than two days without a movement. °Do not use MiraLax more than once per day. Call your doctor if you are still constipated or irregular after using this medication for 7 days in a row. ° °If you continue to have problems with postoperative constipation, please contact the office for further assistance and recommendations.  If you experience "the worst abdominal pain ever" or develop nausea or vomiting, please contact the office immediatly for further recommendations for treatment. ° °ITCHING ° If you experience itching with your medications, try taking only a single pain pill, or even half a pain pill at a time.  You can also use Benadryl over the counter for itching or also to help with sleep.  ° °TED HOSE STOCKINGS °Wear the elastic stockings on both legs for three weeks following surgery during the day but you may remove then at night for sleeping. ° °MEDICATIONS °See your medication summary on the “After Visit Summary” that the nursing staff will review with you prior to discharge.  You may have some home medications which will be placed on hold until you complete the course of blood thinner medication.  It is important for you to complete the blood thinner medication as prescribed by your surgeon.  Continue your approved medications as instructed at time of discharge. ° °PRECAUTIONS °If you experience chest pain or shortness of breath - call 911 immediately for transfer to the hospital emergency department.  °If you develop a fever greater that 101 F, purulent drainage  from wound, increased redness or drainage from wound, foul odor from the wound/dressing, or calf pain - CONTACT YOUR SURGEON.   °                                                °FOLLOW-UP APPOINTMENTS °Make sure you keep all of your appointments after your operation with your surgeon and caregivers. You should call the office at the above phone number and make an appointment for approximately two weeks after the date of your surgery or on the date instructed by your surgeon outlined in the "After Visit Summary". ° °RANGE OF MOTION AND STRENGTHENING EXERCISES  °These exercises are designed to help you keep full movement of your hip joint. Follow your caregiver's or physical therapist's instructions. Perform all exercises about fifteen times, three times per day or as directed. Exercise both hips, even if you have   had only one joint replacement. These exercises can be done on a training (exercise) mat, on the floor, on a table or on a bed. Use whatever works the best and is most comfortable for you. Use music or television while you are exercising so that the exercises are a pleasant break in your day. This will make your life better with the exercises acting as a break in routine you can look forward to.  °• Lying on your back, slowly slide your foot toward your buttocks, raising your knee up off the floor. Then slowly slide your foot back down until your leg is straight again.  °• Lying on your back spread your legs as far apart as you can without causing discomfort.  °• Lying on your side, raise your upper leg and foot straight up from the floor as far as is comfortable. Slowly lower the leg and repeat.  °• Lying on your back, tighten up the muscle in the front of your thigh (quadriceps muscles). You can do this by keeping your leg straight and trying to raise your heel off the floor. This helps strengthen the largest muscle supporting your knee.  °• Lying on your back, tighten up the muscles of your buttocks both  with the legs straight and with the knee bent at a comfortable angle while keeping your heel on the floor.  ° °IF YOU ARE TRANSFERRED TO A SKILLED REHAB FACILITY °If the patient is transferred to a skilled rehab facility following release from the hospital, a list of the current medications will be sent to the facility for the patient to continue.  When discharged from the skilled rehab facility, please have the facility set up the patient's Home Health Physical Therapy prior to being released. Also, the skilled facility will be responsible for providing the patient with their medications at time of release from the facility to include their pain medication, the muscle relaxants, and their blood thinner medication. If the patient is still at the rehab facility at time of the two week follow up appointment, the skilled rehab facility will also need to assist the patient in arranging follow up appointment in our office and any transportation needs. ° °MAKE SURE YOU:  °• Understand these instructions.  °• Get help right away if you are not doing well or get worse.  ° ° °Pick up stool softner and laxative for home use following surgery while on pain medications. °Do not submerge incision under water. °Please use good hand washing techniques while changing dressing each day. °May shower starting three days after surgery. °Please use a clean towel to pat the incision dry following showers. °Continue to use ice for pain and swelling after surgery. °Do not use any lotions or creams on the incision until instructed by your surgeon. ° °

## 2019-07-30 NOTE — Anesthesia Procedure Notes (Signed)
Procedure Name: Intubation Date/Time: 07/30/2019 1:54 PM Performed by: Victoriano Lain, CRNA Pre-anesthesia Checklist: Patient identified, Emergency Drugs available, Suction available, Patient being monitored and Timeout performed Patient Re-evaluated:Patient Re-evaluated prior to induction Oxygen Delivery Method: Circle system utilized Preoxygenation: Pre-oxygenation with 100% oxygen Induction Type: IV induction Ventilation: Mask ventilation without difficulty and Oral airway inserted - appropriate to patient size Laryngoscope Size: Mac and 4 Grade View: Grade I Tube type: Oral Tube size: 7.5 mm Number of attempts: 1 Airway Equipment and Method: Stylet Placement Confirmation: ETT inserted through vocal cords under direct vision,  positive ETCO2 and breath sounds checked- equal and bilateral Secured at: 22 cm Tube secured with: Tape Dental Injury: Teeth and Oropharynx as per pre-operative assessment

## 2019-07-30 NOTE — Interval H&P Note (Signed)
History and Physical Interval Note:  07/30/2019 12:50 PM  Monna Fam  has presented today for surgery, with the diagnosis of Right hip osteoarthritis.  The various methods of treatment have been discussed with the patient and family. After consideration of risks, benefits and other options for treatment, the patient has consented to  Procedure(s) with comments: [place] (Right) - 1101min as a surgical intervention.  The patient's history has been reviewed, patient examined, no change in status, stable for surgery.  I have reviewed the patient's chart and labs.  Questions were answered to the patient's satisfaction.     [name] Zhamir Pirro

## 2019-07-31 ENCOUNTER — Encounter (HOSPITAL_COMMUNITY): Payer: Self-pay | Admitting: Orthopedic Surgery

## 2019-07-31 DIAGNOSIS — M1611 Unilateral primary osteoarthritis, right hip: Secondary | ICD-10-CM | POA: Diagnosis not present

## 2019-07-31 LAB — CBC
HCT: 34.4 % — ABNORMAL LOW (ref 39.0–52.0)
Hemoglobin: 10 g/dL — ABNORMAL LOW (ref 13.0–17.0)
MCH: 27.2 pg (ref 26.0–34.0)
MCHC: 29.1 g/dL — ABNORMAL LOW (ref 30.0–36.0)
MCV: 93.7 fL (ref 80.0–100.0)
Platelets: 181 10*3/uL (ref 150–400)
RBC: 3.67 MIL/uL — ABNORMAL LOW (ref 4.22–5.81)
RDW: 15.9 % — ABNORMAL HIGH (ref 11.5–15.5)
WBC: 9.1 10*3/uL (ref 4.0–10.5)
nRBC: 0 % (ref 0.0–0.2)

## 2019-07-31 LAB — BASIC METABOLIC PANEL
Anion gap: 9 (ref 5–15)
BUN: 17 mg/dL (ref 8–23)
CO2: 24 mmol/L (ref 22–32)
Calcium: 8.3 mg/dL — ABNORMAL LOW (ref 8.9–10.3)
Chloride: 107 mmol/L (ref 98–111)
Creatinine, Ser: 1.2 mg/dL (ref 0.61–1.24)
GFR calc Af Amer: >60 mL/min (ref >60)
GFR calc non Af Amer: >60 mL/min (ref >60)
Glucose, Bld: 138 mg/dL — ABNORMAL HIGH (ref 70–99)
Potassium: 4.9 mmol/L (ref 3.5–5.1)
Sodium: 140 mmol/L (ref 135–145)

## 2019-07-31 MED ORDER — RIVAROXABAN 10 MG PO TABS: 10.0000 mg | ORAL_TABLET | Freq: Every day | ORAL | 0 refills | Status: DC

## 2019-07-31 MED ORDER — METHOCARBAMOL 500 MG PO TABS: 500.0000 mg | ORAL_TABLET | Freq: Four times a day (QID) | ORAL | 0 refills | Status: DC | PRN

## 2019-07-31 MED ORDER — HYDROCODONE-ACETAMINOPHEN 7.5-325 MG PO TABS: 1.0000 | ORAL_TABLET | Freq: Four times a day (QID) | ORAL | 0 refills | Status: DC | PRN

## 2019-07-31 NOTE — Evaluation (Signed)
Physical Therapy Evaluation Patient Details Name: Bradley Morgan MRN: [id number] DOB: Jan 02, 1951 Today's Date: 07/31/2019   History of Present Illness  68 yo male s/p R THA-direct anterior 07/30/2019. Hx of L THA 09/2018, L TKA with revision, R/L RTC repairs, neuropathy, ACDF C5-C7, lumbar surgery x 5.  Clinical Impression  On eval, pt required Mod assist for bed mobility and Min assist to stand and pivot, bed to recliner. Increased time and effort for all tasks. Pt reports significant pain at rest and with activity. Will continue to follow and progress activity as tolerated. Unsure if pt will meet his PT goals on today.     Follow Up Recommendations Follow surgeon's recommendation for DC plan and follow-up therapies    Equipment Recommendations  None recommended by PT    Recommendations for Other Services       Precautions / Restrictions Precautions Precautions: Fall Restrictions Weight Bearing Restrictions: No Other Position/Activity Restrictions: WBAT      Mobility  Bed Mobility Overal bed mobility: Needs Assistance Bed Mobility: Supine to Sit     Supine to sit: HOB elevated;Mod assist     General bed mobility comments: Assist for trunk and R LE. Increased time and effort. Pt is experiencing significant pain but is agreeable to trying to get OOB  Transfers Overall transfer level: Needs assistance Equipment used: Rolling walker (2 wheeled) Transfers: Sit to/from [employer] Transfers Sit to Stand: From elevated surface;Min assist Stand pivot transfers: Min assist       General transfer comment: Assist to rise, stabilize, control descent. VCs safety, technique, hand placement. Increased time/effort. Stand pivot, bed to recliner, using RW  Ambulation/Gait             General Gait Details: NT-pt declined ambulation this a.m 2* pain.  Stairs            Wheelchair Mobility    Modified Rankin (Stroke Patients Only)       Balance Overall  balance assessment: Needs assistance         Standing balance support: Bilateral upper extremity supported Standing balance-Leahy Scale: Poor                               Pertinent Vitals/Pain Pain Assessment: 0-10 Pain Score: 9  Pain Location: R hip/thigh Pain Descriptors / Indicators: Discomfort;Sore Pain Intervention(s): Monitored during session;Limited activity within patient's tolerance    Home Living Family/patient expects to be discharged to:: Private residence Living Arrangements: Spouse/significant other Available Help at Discharge: Family Type of Home: House Home Access: Stairs to enter Entrance Stairs-Rails: Right;Left;Can reach both Entrance Stairs-Number of Steps: 5 Home Layout: One level Home Equipment: Crutches;Walker - 2 wheels;Cane - single point;Bedside commode      Prior Function Level of Independence: Independent         Comments: Pt reports a couple of falls in the past     Hand Dominance        Extremity/Trunk Assessment   Upper Extremity Assessment Upper Extremity Assessment: Overall WFL for tasks assessed    Lower Extremity Assessment Lower Extremity Assessment: Generalized weakness    Cervical / Trunk Assessment Cervical / Trunk Assessment: Normal  Communication      Cognition Arousal/Alertness: Awake/alert Behavior During Therapy: WFL for tasks assessed/performed Overall Cognitive Status: Within Functional Limits for tasks assessed  General Comments      Exercises Total Joint Exercises Ankle Circles/Pumps: AROM;Both;10 reps;Supine Quad Sets: AROM;Right;5 reps;Supine Heel Slides: AAROM;Right;5 reps;Supine   Assessment/Plan    PT Assessment Patient needs continued PT services  PT Problem List Decreased strength;Decreased mobility;Decreased range of motion;Decreased activity tolerance;Decreased balance;Decreased knowledge of use of DME;Pain       PT  Treatment Interventions DME instruction;Gait training;Therapeutic activities;Therapeutic exercise;Patient/family education;Balance training;Stair training;Functional mobility training    PT Goals (Current goals can be found in the Care Plan section)  Acute Rehab PT Goals Patient Stated Goal: less pain PT Goal Formulation: With patient Time For Goal Achievement: 08/14/19 Potential to Achieve Goals: Good    Frequency 7X/week   Barriers to discharge        Co-evaluation               AM-PAC PT "6 Clicks" Mobility  Outcome Measure Help needed turning from your back to your side while in a flat bed without using bedrails?: A Lot Help needed moving from lying on your back to sitting on the side of a flat bed without using bedrails?: A Lot Help needed moving to and from a bed to a chair (including a wheelchair)?: A Little Help needed standing up from a chair using your arms (e.g., wheelchair or bedside chair)?: A Little Help needed to walk in hospital room?: A Little Help needed climbing 3-5 steps with a railing? : Total 6 Click Score: 14    End of Session Equipment Utilized During Treatment: Gait belt Activity Tolerance: Patient limited by pain;Patient limited by fatigue Patient left: in chair;with call bell/phone within reach   PT Visit Diagnosis: Other abnormalities of gait and mobility (R26.89);Pain Pain - Right/Left: Right Pain - part of body: Hip    Time: [id number] PT Time Calculation (min) (ACUTE ONLY): 33 min   Charges:   PT Evaluation $PT Eval Low Complexity: 1 Low PT Treatments $Therapeutic Activity: 8-22 mins         Weston Anna, PT Acute Rehabilitation Services Pager: [phone] Office: [phone]

## 2019-07-31 NOTE — Progress Notes (Signed)
Subjective: 1 Day Post-Op Procedure(s) (LRB): TOTAL HIP ARTHROPLASTY ANTERIOR APPROACH (Right) Patient reports pain as mild.   Patient seen in rounds by Dr. Wynelle Link. Patient is well, and has had no acute complaints or problems other than discomfort in the right hip. No acute events overnight. Voiding without difficulty, positive flatus. Denies CP, SHOB. We will start therapy today.   Objective: Vital signs in last 24 hours: Temp:  [97.7 F (36.5 C)-98.9 F (37.2 C)] 98.4 F (36.9 C) (11/10 0511) Pulse Rate:  [78-93] 87 (11/10 0511) Resp:  [16-27] 18 (11/10 0511) BP: (127-154)/(75-99) 145/77 (11/10 0511) SpO2:  [90 %-100 %] 100 % (11/10 0511) Weight:  [128.8 kg] 128.8 kg (11/09 1117)  Intake/Output from previous day:  Intake/Output Summary (Last 24 hours) at 07/31/2019 0805 Last data filed at 07/31/2019 0600 Gross per 24 hour  Intake 4231.25 ml  Output 2150 ml  Net 2081.25 ml     Intake/Output this shift: No intake/output data recorded.  Labs: Recent Labs    07/31/19 0214  HGB 10.0*   Recent Labs    07/31/19 0214  WBC 9.1  RBC 3.67*  HCT 34.4*  PLT 181   Recent Labs    07/31/19 0214  NA 140  K 4.9  CL 107  CO2 24  BUN 17  CREATININE 1.20  GLUCOSE 138*  CALCIUM 8.3*   No results for input(s): LABPT, INR in the last 72 hours.  Exam: General - Patient is Alert and Oriented Extremity - Neurologically intact Sensation intact distally Intact pulses distally Dorsiflexion/Plantar flexion intact Dressing - dressing C/D/I Motor Function - intact, moving foot and toes well on exam.   Past Medical History:  Diagnosis Date  . Adrenal adenoma, left   . Arthritis   . Bursitis of elbow   . Complication of anesthesia    ANESTHESIA DID NOT TAKE EFFECT WITH BACK SURGERY "I FELT THEM CUT"  . Congenital absence of right kidney   . Diverticulosis of colon   . Family history of anesthesia complication    "hard time waking up my dad" many yrs ago  .  Frequency of urination   . Full dentures   . GAD (generalized anxiety disorder)   . GERD (gastroesophageal reflux disease)   . History of acute renal failure    12-16-2013;  09-24-2017 due to stone obstruction  . 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 History of encephalopathy 04/26/2014   ACUTE EPISODE SECONDARY TO DRUG OVERDOSE (NARC, BZD, FLEXERIL)  . History of GI bleed    08/ 2001   duodenal ulcer bleed post egd w/ cauterization  . History of prostatitis   . Hyperlipidemia   . Hypertension   . Left ureteral stone   . Nocturia more than twice per night   . Peripheral neuropathy    bilateral leg numbness due to back issues, walks w/ cane  . PONV (postoperative nausea and vomiting)   . Restless leg syndrome   . Solitary left kidney   . Wears glasses     Assessment/Plan: 1 Day Post-Op Procedure(s) (LRB): TOTAL HIP ARTHROPLASTY ANTERIOR APPROACH (Right) Active Problems:   OA (osteoarthritis) of hip  Estimated body mass index is 38.52 kg/m as calculated from the following:   Height as of this encounter: 6' (1.829 m).   Weight as of this encounter: 128.8 kg. Advance diet Up with therapy D/C IV fluids  DVT Prophylaxis -  Xarelto Weight bearing as tolerated. D/C O2 and pulse ox and try on room air. Hemovac pulled without difficulty, will begin therapy.  Plan is to go Home after hospital stay with HEP. Plan for discharge today following 1-2 sessions of therapy as long as he is meeting his goals. Follow up in the office in 2 weeks.   Griffith Citron, PA-C Orthopedic Surgery [phone] 07/31/2019, 8:05 AM

## 2019-07-31 NOTE — Progress Notes (Signed)
Therapy Plan: HEP Has DME 

## 2019-07-31 NOTE — Plan of Care (Signed)
  Problem: Education: Goal: Knowledge of the prescribed therapeutic regimen will improve Outcome: Progressing   Problem: Activity: Goal: Ability to avoid complications of mobility impairment will improve Outcome: Progressing Goal: Ability to tolerate increased activity will improve Outcome: Progressing   Problem: Pain Management: Goal: Pain level will decrease with appropriate interventions Outcome: Progressing   

## 2019-07-31 NOTE — Progress Notes (Addendum)
Physical Therapy Treatment Patient Details Name: Bradley Morgan MRN: [id number] DOB: 1951-01-11 Today's Date: 07/31/2019    History of Present Illness 68 yo male s/p R THA-direct anterior 07/30/2019. Hx of L THA 09/2018, L TKA with revision, R/L RTC repairs, neuropathy, ACDF C5-C7, lumbar surgery x 5.    PT Comments    Reviewed/practiced gait training and stair training. Pt walked ~40 feet with a RW. Mobility remains effortful and pt continues to report 8/10 pain with activity. Discussed d/c plan. Feel pt could benefit from another night's stay for continued PT and improved pain control. Pt is not agreeable to stay. He is adamant about going home- "No..Im not staying. I have to go home today." Made RN aware.     Follow Up Recommendations  Follow surgeon's recommendation for DC plan and follow-up therapies 24 hour supervision/assist   Equipment Recommendations  None recommended by PT    Recommendations for Other Services       Precautions / Restrictions Precautions Precautions: Fall Restrictions Weight Bearing Restrictions: No Other Position/Activity Restrictions: WBAT    Mobility  Bed Mobility Overal bed mobility: Needs Assistance Bed Mobility: Supine to Sit          General bed mobility comments: oob in recliner  Transfers Overall transfer level: Needs assistance Equipment used: Rolling walker (2 wheeled) Transfers: Sit to/from Stand Sit to Stand: Min assist Stand pivot transfers: Min assist       General transfer comment: Assist to rise, stabilize, control descent. VCs safety, technique, hand placement. Increased time/effort. Unsteady.  Ambulation/Gait Ambulation/Gait assistance: Min assist Gait Distance (Feet): 40 Feet Assistive device: Rolling walker (2 wheeled) Gait Pattern/deviations: Step-to pattern;Trunk flexed;Antalgic     General Gait Details: Assist to stabilize pt throughout distance. Slow gait speed. Pt fatigued after walk. He c/o some  "wooziness" but he was able to continue on.   Stairs Stairs: Yes Stairs assistance: Min assist Stair Management: Forwards;Step to pattern;Two rails Number of Stairs: 2 General stair comments: Up and over portable stairs x 1. Assist to steady.   Wheelchair Mobility    Modified Rankin (Stroke Patients Only)       Balance Overall balance assessment: Needs assistance         Standing balance support: Bilateral upper extremity supported Standing balance-Leahy Scale: Poor                              Cognition Arousal/Alertness: Awake/alert Behavior During Therapy: WFL for tasks assessed/performed Overall Cognitive Status: Within Functional Limits for tasks assessed                                        Exercises   General Comments        Pertinent Vitals/Pain Pain Assessment: 0-10 Pain Score: 8  Pain Location: R hip/thigh Pain Descriptors / Indicators: Aching;Discomfort;Sore Pain Intervention(s): Monitored during session;Limited activity within patient's tolerance;Repositioned    Home Living                      Prior Function            PT Goals (current goals can now be found in the care plan section) Acute Rehab PT Goals Patient Stated Goal: less pain PT Goal Formulation: With patient Time For Goal Achievement: 08/14/19 Potential to Achieve Goals: Good Progress towards PT goals:  Progressing toward goals    Frequency    7X/week      PT Plan      Co-evaluation              AM-PAC PT "6 Clicks" Mobility   Outcome Measure  Help needed turning from your back to your side while in a flat bed without using bedrails?: A Lot Help needed moving from lying on your back to sitting on the side of a flat bed without using bedrails?: A Lot Help needed moving to and from a bed to a chair (including a wheelchair)?: A Little Help needed standing up from a chair using your arms (e.g., wheelchair or bedside chair)?:  A Little Help needed to walk in hospital room?: A Little Help needed climbing 3-5 steps with a railing? : A Little 6 Click Score: 16    End of Session Equipment Utilized During Treatment: Gait belt Activity Tolerance: Patient limited by fatigue;Patient limited by pain Patient left: in bed;with call bell/phone within reach;with bed alarm set(sitting EOB)   PT Visit Diagnosis: Other abnormalities of gait and mobility (R26.89);Pain Pain - Right/Left: Right Pain - part of body: Hip     Time: [id number] PT Time Calculation (min) (ACUTE ONLY): 35 min  Charges:  $Gait Training: 23-37 mins $Therapeutic Activity: 8-22 mins                        Weston Anna, PT Acute Rehabilitation Services Pager: [phone] Office: [phone]

## 2021-04-14 ENCOUNTER — Ambulatory Visit: Payer: Self-pay | Admitting: General Surgery

## 2021-04-14 NOTE — H&P (Signed)
REFERRING PHYSICIAN:  Christain Sacramento, MD   PROVIDER:  Leanora Ivanoff, MD   MRN: [id number] DOB: 1951-09-18 DATE OF ENCOUNTER: 04/14/2021   Subjective    Chief Complaint: Umbilical Hernia       History of Present Illness: Bradley Morgan is a 70 y.o. male who is seen today as an office consultation at the request of Dr. Redmond Pulling for evaluation of Umbilical Hernia .     He noticed some pain and tightness in his periumbilical region about a week and a half ago.  When he looked at the area, he felt a small lump above his umbilicus.  He has not had any changes in bowel or bladder habits.  He feels it gets smaller when he lays down.     Review of Systems: A complete review of systems was obtained from the patient.  I have reviewed this information and discussed as appropriate with the patient.  See HPI as well for other ROS.   ROS      Medical History: Past Medical History      Past Medical History:  Diagnosis Date   Anxiety     GERD (gastroesophageal reflux disease)     Hyperlipidemia          There is no problem list on file for this patient.     Past Surgical History       Past Surgical History:  Procedure Laterality Date   ARTHROPLASTY HIP TOTAL Right 07/30/2019   ARTHROPLASTY HIP TOTAL Left 10/02/2018        Allergies       Allergies  Allergen Reactions   Opioids - Morphine Analogues Nausea and Shortness Of Breath   Aspirin Other (See Comments)      No high doses bleeding ulcers   Meloxicam Other (See Comments)      Upset stomach, abdominal pain              Current Outpatient Medications on File Prior to Visit  Medication Sig Dispense Refill   carvediloL (COREG) 3.125 MG tablet TAKE 1 TABLET TWICE DAILY WITH MEALS TO CONTROL BLOOD PRESSURE       doxazosin (CARDURA) 4 MG tablet Take one tablet at bedtime to improve bladder function       FUROsemide (LASIX) 20 MG tablet TAKE 1 TABLET IN THE MORNING AS NEEDED FOR FLUID RETENTION.        pantoprazole (PROTONIX) 40 MG DR tablet TAKE 1 TABLET EVERY DAY TO CONTROL ACID REFLUX SYMPTOMS       rOPINIRole (REQUIP) 1 MG tablet TAKE 1 TABLET AT BEDTIME FOR RESTLESS LEGS, MAY TAKE AN ADDITIONAL DOSE DURING THE NIGHT IF NEEDED.       simvastatin (ZOCOR) 40 MG tablet Take by mouth       aspirin 81 MG EC tablet Take by mouth once daily       clonazePAM (KLONOPIN) 0.5 MG tablet         HYDROcodone-acetaminophen (NORCO) 10-325 mg tablet          No current facility-administered medications on file prior to visit.      Family History       Family History  Problem Relation Age of Onset   High blood pressure (Hypertension) Mother     High blood pressure (Hypertension) Father     Coronary Artery Disease (Blocked arteries around heart) Father     Diabetes Father  Social History        Tobacco Use  Smoking Status Former Smoker   Quit date: 1992   Years since quitting: 30.5  Smokeless Tobacco Never Used      Social History  Social History         Socioeconomic History   Marital status: Married  Tobacco Use   Smoking status: Former Smoker      Quit date: 1992      Years since quitting: 30.5   Smokeless tobacco: Never Used  Substance and Sexual Activity   Alcohol use: Never   Drug use: Never        Objective:         Vitals:    04/14/21 1005  BP: (!) 160/100  Pulse: 86  Temp: 36.8 C (98.3 F)  SpO2: 96%  Weight: (!) 106 kg (233 lb 9.6 oz)  Height: 182.9 cm (6')    Body mass index is 31.68 kg/m.   Physical Exam    General appearance - alert, well appearing, and in no distress Mental status - alert, oriented to person, place, and time Neck - supple, no significant adenopathy Chest - clear to auscultation, no wheezes, rales or rhonchi, symmetric air entry Heart - normal rate, regular rhythm, normal S1, S2, no murmurs, rubs, clicks or gallops Abdomen - Soft, nontender, small supraumbilical hernia reduces at least partially.  Mild tenderness on  reduction, no overlying skin changes Neurological - alert, oriented, normal speech, no focal findings or movement disorder noted Musculoskeletal - Walks with a cane, status post bilateral hip and bilateral knee replacements     Labs, Imaging and Diagnostic Testing: N/A   Assessment and Plan:  Diagnoses and all orders for this visit:   Umbilical hernia without obstruction or gangrene       I have offered repair of the supraumbilical hernia with mesh as an outpatient surgical procedure.  I discussed the procedure, risks, and benefits with him.  I gave him an educational booklet.  He is agreeable and will schedule.   No follow-ups on file.   Leanora Ivanoff, MD

## 2021-05-04 ENCOUNTER — Encounter (HOSPITAL_COMMUNITY): Payer: Self-pay

## 2021-05-04 ENCOUNTER — Ambulatory Visit: Payer: Self-pay | Admitting: General Surgery

## 2021-05-04 NOTE — Pre-Procedure Instructions (Signed)
Bradley Morgan  05/04/2021    Your procedure is scheduled on Monday, May 11, 2021 at 9:00 AM.   Report to [facility] Entrance "A" Admitting Office  at 7:00 AM.   Call this number if you have problems the morning of surgery: [phone]   Questions prior to day of surgery, please call [phone] between 8 & 4 PM.    Remember:  Do not eat food after midnight Sunday, 05/10/21.  You may drink clear liquids until 6:00 AM.  Clear liquids allowed are:  Water, Juice (non-citric and without pulp - diabetics please choose diet or no sugar options), Carbonated beverages - (diabetics please choose diet or no sugar options), Clear Tea, Black Coffee only (no creamer, milk or cream including half and half), and Gatorade (diabetics please choose diet or no sugar options)    Take these medicines the morning of surgery with A SIP OF WATER: Carvedilol (Coreg), Clonazepam (Klonopin), Gabapentin (Neurontin), Pantoprazole (Protonix), Simvastatin (Zocor), Hydrocodone (Norco), Cyclobenzaprine (Flexeril) - if needed, may use eye drops if needed.   Stop Aspirin as instructed by surgeon/primary care physician. Do not use any other Aspirin containing products, NSAIDS (Ibuprofen, Aleve, etc), Herbal medications, Vitamins or Fish Oil prior to surgery.    Do not wear jewelry.  Do not wear lotions, powders, cologne or deodorant.  Men may shave face and neck.  Do not bring valuables to the hospital.  [facility] is not responsible for any belongings or valuables.  Contacts, dentures or bridgework may not be worn into surgery.  Leave your suitcase in the car.  After surgery it may be brought to your room.  For patients admitted to the hospital, discharge time will be determined by your treatment team.  Patients discharged the day of surgery will not be allowed to drive home.   [facility] - Preparing for Surgery  Before surgery, you can play an important role.  Because skin is not sterile, your skin  needs to be as free of germs as possible.  You can reduce the number of germs on you skin by washing with CHG (chlorahexidine gluconate) soap before surgery.  CHG is an antiseptic cleaner which kills germs and bonds with the skin to continue killing germs even after washing.  Oral Hygiene is also important in reducing the risk of infection.  Remember to brush your teeth with your regular toothpaste the morning of surgery.  Please DO NOT use if you have an allergy to CHG or antibacterial soaps.  If your skin becomes reddened/irritated stop using the CHG and inform your nurse when you arrive at Short Stay.  Do not shave (including legs and underarms) for at least 48 hours prior to the first CHG shower.  You may shave your face.  Please follow these instructions carefully:   1.  Shower with CHG Soap the night before surgery and the morning of Surgery.  2.  If you choose to wash your hair, wash your hair first as usual with your normal shampoo.  3.  After you shampoo, rinse your hair and body thoroughly to remove the shampoo. 4.  Use CHG as you would any other liquid soap.  You can apply chg directly to the skin and wash gently with a      scrungie or washcloth.           5.  Apply the CHG Soap to your body ONLY FROM THE NECK DOWN.   Do not use on open wounds or open  sores. Avoid contact with your eyes, ears, mouth and genitals (private parts).  Wash genitals (private parts) with your normal soap, do this prior to using CHG soap.  6.  Wash thoroughly, paying special attention to the area where your surgery will be performed.  7.  Thoroughly rinse your body with warm water from the neck down.  8.  DO NOT shower/wash with your normal soap after using and rinsing off the CHG Soap.  9.  Pat yourself dry with a clean towel.            10.  Wear clean pajamas.            11.  Place clean sheets on your bed the night of your first shower and do not sleep with pets.  Day of Surgery  Shower as above.  Do  not apply any lotions/deodorants the morning of surgery.   Please wear clean clothes to the hospital. Remember to brush your teeth with toothpaste.  Please read over the sheets that you were given.

## 2021-05-05 ENCOUNTER — Other Ambulatory Visit: Payer: Self-pay

## 2021-05-05 ENCOUNTER — Encounter (HOSPITAL_COMMUNITY)
Admission: RE | Admit: 2021-05-05 | Discharge: 2021-05-05 | Disposition: A | Discharge status: Home / Self Care | Payer: Medicare HMO | Source: Ambulatory Visit | Attending: General Surgery | Admitting: General Surgery

## 2021-05-05 ENCOUNTER — Encounter (HOSPITAL_COMMUNITY): Payer: Self-pay

## 2021-05-05 DIAGNOSIS — I447 Left bundle-branch block, unspecified: Secondary | ICD-10-CM | POA: Insufficient documentation

## 2021-05-05 DIAGNOSIS — Z01818 Encounter for other preprocedural examination: Secondary | ICD-10-CM | POA: Diagnosis present

## 2021-05-05 DIAGNOSIS — I440 Atrioventricular block, first degree: Secondary | ICD-10-CM | POA: Insufficient documentation

## 2021-05-05 HISTORY — DX: Personal history of urinary calculi: Z87.442

## 2021-05-05 HISTORY — DX: Left bundle-branch block, unspecified: I44.7

## 2021-05-05 HISTORY — DX: Pneumonia, unspecified organism: J18.9

## 2021-05-05 LAB — CBC
HCT: 38 % — ABNORMAL LOW (ref 39.0–52.0)
Hemoglobin: 12.2 g/dL — ABNORMAL LOW (ref 13.0–17.0)
MCH: 31 pg (ref 26.0–34.0)
MCHC: 32.1 g/dL (ref 30.0–36.0)
MCV: 96.4 fL (ref 80.0–100.0)
Platelets: 181 10*3/uL (ref 150–400)
RBC: 3.94 MIL/uL — ABNORMAL LOW (ref 4.22–5.81)
RDW: 14.1 % (ref 11.5–15.5)
WBC: 4.3 10*3/uL (ref 4.0–10.5)
nRBC: 0 % (ref 0.0–0.2)

## 2021-05-05 LAB — BASIC METABOLIC PANEL
Anion gap: 10 (ref 5–15)
BUN: 16 mg/dL (ref 8–23)
CO2: 24 mmol/L (ref 22–32)
Calcium: 9.1 mg/dL (ref 8.9–10.3)
Chloride: 106 mmol/L (ref 98–111)
Creatinine, Ser: 1.22 mg/dL (ref 0.61–1.24)
GFR, Estimated: >60 mL/min (ref >60)
Glucose, Bld: 95 mg/dL (ref 70–99)
Potassium: 4.3 mmol/L (ref 3.5–5.1)
Sodium: 140 mmol/L (ref 135–145)

## 2021-05-05 NOTE — Progress Notes (Addendum)
PCP - Dr. Kathryne Eriksson  EKG - today Stress Test - 2015  Aspirin Instructions: Last dose 04/27/21  ERAS Protcol - yes PRE-SURGERY Ensure or G2- no drink ordered  COVID TEST- no   Anesthesia review: yes, EKG (?new LBBB)  Patient denies shortness of breath, fever, cough and chest pain at PAT appointment   All instructions explained to the patient, with a verbal understanding of the material. Patient agrees to go over the instructions while at home for a better understanding. Patient also instructed to self quarantine after being tested for COVID-19. The opportunity to ask questions was provided.

## 2021-05-06 ENCOUNTER — Encounter (HOSPITAL_COMMUNITY): Payer: Self-pay | Admitting: Physician Assistant

## 2021-05-07 ENCOUNTER — Telehealth: Payer: Self-pay

## 2021-05-07 NOTE — Telephone Encounter (Signed)
   [facility] Pre-operative Risk Assessment    Patient Name: Bradley Morgan  DOB: 10-06-1950 MRN: [id number]  HEARTCARE STAFF:  - IMPORTANT!!!!!! Under Visit Info/Reason for Call, type in Other and utilize the format Clearance MM/DD/YY or Clearance TBD. Do not use dashes or single digits. - Please review there is not already an duplicate clearance open for this procedure. - If request is for dental extraction, please clarify the # of teeth to be extracted. - If the patient is currently at the dentist's office, call Pre-Op Callback Staff (MA/nurse) to input urgent request.  - If the patient is not currently in the dentist office, please route to the Pre-Op pool.  Request for surgical clearance:  What type of surgery is being performed? Umbilical Hernia Repair  When is this surgery scheduled? TBD  What type of clearance is required (medical clearance vs. Pharmacy clearance to hold med vs. Both)? Both   Are there any medications that need to be held prior to surgery and how long? ASA   Practice name and name of physician performing surgery? [facility],   What is the office phone number? [phone]   7.   What is the office fax number? [phone] Attn: Lindwood Coke, RN  8.   Anesthesia type (None, local, MAC, general) ? General    Jacqulynn Cadet 05/07/2021, 3:12 PM  _________________________________________________________________   (provider comments below)

## 2021-05-08 NOTE — Telephone Encounter (Signed)
Primary Cardiologist:None  Chart reviewed as part of pre-operative protocol coverage. Because of Bradley Morgan's past medical history and time since last visit, he/she will require a follow-up visit in order to better assess preoperative cardiovascular risk.  Pre-op covering staff: - Please schedule appointment and call patient to inform them. - Please contact requesting surgeon's office via preferred method (i.e, phone, fax) to inform them of need for appointment prior to surgery.  If applicable, this message will also be routed to pharmacy pool and/or primary cardiologist for input on holding anticoagulant/antiplatelet agent as requested below so that this information is available at time of patient's appointment.   Deberah Pelton, NP  05/08/2021, 9:48 AM

## 2021-05-08 NOTE — Telephone Encounter (Signed)
Appointment scheduled 05/28/2021 Time:9:15 AM  Forwarded to requesting party via [place] fax function

## 2021-05-11 ENCOUNTER — Encounter (HOSPITAL_COMMUNITY): Admission: RE | Payer: Self-pay | Source: Home / Self Care

## 2021-05-11 ENCOUNTER — Ambulatory Visit (HOSPITAL_COMMUNITY): Admission: RE | Admit: 2021-05-11 | Payer: Medicare HMO | Source: Home / Self Care | Admitting: General Surgery

## 2021-05-11 SURGERY — REPAIR, HERNIA, UMBILICAL, ADULT
Anesthesia: General

## 2021-05-16 NOTE — Progress Notes (Addendum)
Cardiology Office Note:    Date:  05/28/2021   ID:  Bradley Morgan, DOB [birth year], MRN [id number]  PCP:  Bradley Sacramento, MD  Cardiologist:  Dr. Marlou Porch Electrophysiologist:  None   Referring MD: Bradley Sacramento, MD   Chief Complaint: pre-op evaluation   History of Present Illness:    Bradley Morgan is a 70 y.o. male with a history of hypertension, hyperlipidemia, GERD, adrenal adenoma, anxiety, and remote tobacco use (quit smoking in early 2000s)  who presents today for pre-op evaluation for upcoming umbilical hernia repair.  Patient was seen by Dr. Marlou Porch in 11/2013 during an admission for sudden onset of chest pain. Chest pain only lasted for a few minutes but was associated with nausea and diaphoresis. Troponin was normal and EKG unremarkable. Given multiple CV risk factors (hypertension, hyperlipidemia, former tobacco use, and family history), Lexiscan Myoview was ordered and showed a fixed defect in the inferoseptal region but no ischemia. EF 63%. He was not felt to need any follow-up with Cardiology after discharge and has not been seen by Cardiology since that time.   He presents today for pre-op evaluation for upcoming umbilical hernia repair after EKG at pre-anesthesia visit showed new LBBB.  Here with wife.  Patient denies any chest pain.  He does describe some dyspnea on exertion.  This occurs when walking up the stairs and in doing basic household chores.  It does not sound like this is new but I am concerned that he may be downplaying his symptoms some. He sleeps on an incline and has done this for years but is unable to tell me if this is due to trouble breathing laying flat or due to comfort.  No PND.  He does have chronic lower extremity edema for which he takes Lasix for.  Edema is worse on the left following a knee replacement.  He he notes occasional palpitations that he describes as a skipped beat (PAC noted on rhythm strip today). No sustained palpitations. Sounds like  premature beats. No lightheadedness, dizziness, syncope.  Wife reports snoring and describes apneic episodes.  BMI 29.  He has never had a sleep study but I suspect he has sleep apnea.  Patient has a remote smoking history (quit in year 2000). He also has a family history of CAD with his father having a CABG in his 3s. He also thinks his sister has some heart disease but does not know the specifics.   Past Medical History:  Diagnosis Date   Adrenal adenoma, left    Arthritis    Bursitis of elbow    Complication of anesthesia    ANESTHESIA DID NOT TAKE EFFECT WITH BACK SURGERY "I FELT THEM CUT"   Congenital absence of right kidney    Diverticulosis of colon    Family history of anesthesia complication    "hard time waking up my dad" many yrs ago   Frequency of urination    Full dentures    GAD (generalized anxiety disorder)    GERD (gastroesophageal reflux disease)    History of acute renal failure    12-16-2013;  09-24-2017 due to stone obstruction   History of duodenal ulcer 04/2000   w/ gi bleed post egd w/ cauterization (caused by chronic nsaid use)   History of encephalopathy 04/26/2014   ACUTE EPISODE SECONDARY TO DRUG OVERDOSE (NARC, BZD, FLEXERIL)   History of GI bleed    08/ 2001   duodenal ulcer bleed post egd w/ cauterization  History of kidney stones    History of prostatitis    Hyperlipidemia    Hypertension    Left ureteral stone    Nocturia more than twice per night    Peripheral neuropathy    bilateral leg numbness due to back issues, walks w/ cane   Pneumonia    PONV (postoperative nausea and vomiting)    Restless leg syndrome    Solitary left kidney    Wears glasses     Past Surgical History:  Procedure Laterality Date   ANTERIOR CERVICAL DECOMP/DISCECTOMY FUSION  05-21-2009   dr Maia Plan [facility]   C5-6, C6-7 and aviator plate   CARDIOVASCULAR STRESS TEST  12/17/2013   FIXED DEFECT IN THE INFEROSEPTAL REGION BUT NO STRESS INDUCED ISCHEMIA/  NORMAL LV  FUNCTION AND WALL MOTION , EF 63%   CLOSED MANIPULATION POST TOTAL KNEE ARTHROPLASTY Left 11-28-2001    dr Wynelle Link   CYSTOSCOPY WITH RETROGRADE PYELOGRAM, URETEROSCOPY AND STENT PLACEMENT Left 10/07/2017   Procedure: CYSTOSCOPY WITH LEFT RETROGRADE PYELOGRAM, BASKETRY OF STONE, URETEROSCOPY AND STENT REMOVAL;  Surgeon: Alexis Frock, MD;  Location: [place];  Service: Urology;  Laterality: Left;   CYSTOSCOPY WITH STENT PLACEMENT Left 09/24/2017   Procedure: RENAL CYSTOSCOPY LEFT RETROGRADE, LEFT URETERAL STENT PLACEMENT;  Surgeon: Alexis Frock, MD;  Location: [place];  Service: Urology;  Laterality: Left;   HIP ARTHROSCOPY W/ LABRAL DEBRIDEMENT Left 08/12/2006   dr Wynelle Link [place]   and chondraplasty   INCISION AND DRAINAGE ABSCESS Right 03/06/2015   Procedure: INCISION AND DRAINAGE ABSCESS RIGHT ARM;  Surgeon: Daryll Brod, MD;  Location: [place];  Service: Orthopedics;  Laterality: Right;   KNEE ARTHROSCOPY Left x2  2002   LUMBAR SPINE SURGERY  x5  last one 2002   SHOULDER ARTHROSCOPY WITH SUBACROMIAL DECOMPRESSION, ROTATOR CUFF REPAIR AND BICEP TENDON REPAIR Right 06-27-2002  dr Wynelle Link [facility]   labral debridement and DCR   SHOULDER OPEN ROTATOR CUFF REPAIR Left 07/11/2014   Procedure: ROTATOR CUFF REPAIR SHOULDER OPEN WITH  GRAFT ;  Surgeon: Tobi Bastos, MD;  Location: [facility];  Service: Orthopedics;  Laterality: Left;   TOTAL HIP ARTHROPLASTY Left 10/02/2018   Procedure: TOTAL HIP ARTHROPLASTY ANTERIOR APPROACH;  Surgeon: Gaynelle Arabian, MD;  Location: [facility];  Service: Orthopedics;  Laterality: Left;  163mn   TOTAL HIP ARTHROPLASTY Right 07/30/2019   Procedure: TOTAL HIP ARTHROPLASTY ANTERIOR APPROACH;  Surgeon: AGaynelle Arabian MD;  Location: [facility];  Service: Orthopedics;  Laterality: Right;  1025m   TOTAL KNEE ARTHROPLASTY [facility]09[facility]2001   [facility] 08/03/2017   Procedure: Left knee tibia polyethylene and patella  revision;  Surgeon: AlGaynelle ArabianMD;  Location: [facility];  Service: Orthopedics;  Laterality: Left;  with abductor block    Current Medications: Current Meds  Medication Sig   aspirin EC 81 MG tablet Take 81 mg by mouth daily. Swallow whole.   Carboxymethylcellul-Glycerin (LUBRICATING EYE DROPS OP) Place 1 drop into both eyes daily as needed (dry eyes).   carvedilol (COREG) 3.125 MG tablet Take 3.125 mg by mouth 2 (two) times daily with a meal.    clonazePAM (KLONOPIN) 0.5 MG tablet Take 0.5 mg by mouth in the morning, at noon, and at bedtime.   cyclobenzaprine (FLEXERIL) 10 MG tablet Take 1 tablet (10 mg total) 3 (three) times daily as needed by mouth for muscle spasms.   furosemide (LASIX) 20 MG tablet Take 20 mg by  mouth daily as needed for fluid.   gabapentin (NEURONTIN) 300 MG capsule Take 300 mg by mouth 3 (three) times daily.   HYDROcodone-acetaminophen (NORCO) 10-325 MG tablet Take 1 tablet by mouth in the morning, at noon, in the evening, and at bedtime.   methocarbamol (ROBAXIN) 500 MG tablet Take 1 tablet (500 mg total) by mouth every 6 (six) hours as needed for muscle spasms.   pantoprazole (PROTONIX) 20 MG tablet Take 20 mg by mouth daily.   rOPINIRole (REQUIP) 1 MG tablet Take 1 mg by mouth See admin instructions. Take 1 mg by mouth at bedtime. May take additional 1 mg if needed for restless leg   simvastatin (ZOCOR) 40 MG tablet Take 40 mg by mouth daily.   triamcinolone cream (KENALOG) 0.1 % Apply 1 application topically at bedtime.     Allergies:   Aspirin and Nsaids   Social History   Socioeconomic History   Marital status: Married    Spouse name: Not on file   Number of children: Not on file   Years of education: Not on file   Highest education level: Not on file  Occupational History   Not on file  Tobacco Use   Smoking status: Former    Years: 15.00    Types: Cigarettes    Quit date: 10/09/1998    Years since quitting: 22.6   Smokeless tobacco: Never   Vaping Use   Vaping Use: Never used  Substance and Sexual Activity   Alcohol use: No   Drug use: No   Sexual activity: Not on file  Other Topics Concern   Not on file  Social History Narrative   Not on file   Social Determinants of Health   Financial Resource Strain: Not on file  Food Insecurity: Not on file  Transportation Needs: Not on file  Physical Activity: Not on file  Stress: Not on file  Social Connections: Not on file     Family History: The patient's family history includes Anxiety disorder in his mother; Diabetes in his father; Heart failure in his father; Hypertension in his father.  ROS:   Please see the history of present illness.     EKGs/Labs/Other Studies Reviewed:    The following studies were reviewed today:  Lexiscan Myoview 12/17/2013: Impression: Fixed defect in the inferoseptal region but there is no stress-induced ischemia.   EKG:  EKG ordered today. EKG personally reviewed and demonstrates normal sinus rhythm, rate 71 bpm, with 1st degree AV block and LBBB. Left axis deviation. QTc 455m but when corrected for LBBB 457 ms. PAC noted on rhythm strip today.   Recent Labs: 05/05/2021: BUN 16; Creatinine, Ser 1.22; Hemoglobin 12.2; Platelets 181; Potassium 4.3; Sodium 140  Recent Lipid Panel    Component Value Date/Time   CHOL 194 12/16/2013 2034   TRIG 164 (H) 12/16/2013 2034   HDL 39 (L) 12/16/2013 2034   CHOLHDL 5.0 12/16/2013 2034   VLDL 33 12/16/2013 2034   LDLCALC 122 (H) 12/16/2013 2034    Physical Exam:    Vital Signs: BP (!) 150/72 (BP Location: Left Arm, Patient Position: Sitting, Cuff Size: Large)   Pulse 70   Ht '6\' 1"'$  (1.854 m)   Wt 226 lb (102.5 kg)   SpO2 98%   BMI 29.82 kg/m     Wt Readings from Last 3 Encounters:  05/28/21 226 lb (102.5 kg)  05/05/21 218 lb (98.9 kg)  07/30/19 284 lb (128.8 kg)     General: 70y.o.  obese African-American male in no acute distress. HEENT: Normocephalic and atraumatic. Sclera clear.   Neck: Supple. No carotid bruits. No JVD. Heart: RRR. Distinct S1 and S2. No murmurs, gallops, or rubs. Radial pulses 2+ and equal bilaterally. Lungs: No increased work of breathing. Clear to ausculation bilaterally. No wheezes, rhonchi, or rales.  Abdomen: Soft, non-distended, and non-tender to palpation. Bowel sounds present. Extremities: Mild lower extremity edema (chronically worse on the left) Skin: Warm and dry. Neuro: Alert and oriented x3. No focal deficits. Psych: Normal affect. Responds appropriately.  Assessment:    1. Pre-op evaluation   2. LBBB (left bundle branch block)   3. Dyspnea on exertion   4. Primary hypertension   5. Hyperlipidemia, unspecified hyperlipidemia type     Plan:    Pre-Op Evaluation  New LBBB Dyspnea on Exertion - Patient has upcoming umbilical hernia repair planned.  - Patient is very sedentary at baseline and reports some dyspnea on exertion with minimal activiites such as walking up the stairs and doing basic household chores. No angina. Concerned that patient may be down-playing his symptoms some. Given new LBBB, will get Echo and Lexiscan Myoview for further evaluation prior to surgery. If EF normal and Lexiscan low risk, OK to proceed with surgery.  Shared Decision Making/Informed Consent{ The risks [chest pain, shortness of breath, cardiac arrhythmias, dizziness, blood pressure fluctuations, myocardial infarction, stroke/transient ischemic attack, nausea, vomiting, allergic reaction, radiation exposure, metallic taste sensation and life-threatening complications (estimated to be 1 in 10,000)], benefits (risk stratification, diagnosing coronary artery disease, treatment guidance) and alternatives of a nuclear stress test were discussed in detail with Mr. Goelz and he agrees to proceed.   Hypertension - BP elevated at 150/72. - Currently on Coreg 3.'125mg'$  twice daily as well as Lasix '20mg'$  daily as needed for edema.  - Will continue current  medications for now and have patient keep daily BP/HR log for 2 weeks and then send this to [place]. Suspect BP will consistently be above goal of <130/80 in which case my plan would be to add HCTZ given some chronic lower extremity edema.  Hyperlipidemia - Looks like lipid panel was last checked in 05/2020 per [facility] but unable to see actual results. - Continue Simvastatin '40mg'$  daily.  - Managed by PCP.  Snoring - Wife reports snoring and describe apneic episodes. - BMI 29.  - Suspect patient has sleep apnea. Offered sleep study but patient declined. Wife also does not think he would be compliant with CPAP>  Disposition: Follow up in 3-4 months.  ADDENDUM 06/21/2021 at 10:19PM: Echo showed LVEF of 44% with global hypokinesis. Myoview showed reduced perfusion in the mid-apical inferior myocardium that is slightly worse at stress than rest consistent with infarct with peri-infarct ischemia. However, there was no significant changes compared to prior Myoview in 2015. Felt to be more consistent with diaphragmatic attenuation. Reviewed results with Dr. Marlou Porch who felt it was OK to proceed with surgery. Will send note to surgeon's office.  Given mildly reduced EF, will likely add Losartan instead of HCTZ (which was the initial plan) once we are able to review BP/HR log.   Medication Adjustments/Labs and Tests Ordered: Current medicines are reviewed at length with the patient today.  Concerns regarding medicines are outlined above.  Orders Placed This Encounter  Procedures   Myocardial Perfusion Imaging   EKG 12-Lead   ECHOCARDIOGRAM COMPLETE   No orders of the defined types were placed in this encounter.   Patient Instructions  Medication Instructions:  Your physician recommends that you continue on your current medications as directed. Please refer to the Current Medication list given to you today.   *If you need a refill on your cardiac medications before your next appointment, please call  your pharmacy*   Lab Work: [place]   If you have labs (blood work) drawn today and your tests are completely normal, you will receive your results only by: [place] (if you have MyChart) OR A paper copy in the mail If you have any lab test that is abnormal or we need to change your treatment, we will call you to review the results.   Testing/Procedures: Your physician has requested that you have an echocardiogram. Echocardiography is a painless test that uses sound waves to create images of your heart. It provides your doctor with information about the size and shape of your heart and how well your heart's chambers and valves are working. This procedure takes approximately one hour. There are no restrictions for this procedure.  Your physician has requested that you have a lexiscan myoview. For further information please visit [email or web address]. Please follow instruction sheet, as given.   Follow-Up: At [facility], you and your health needs are our priority.  As part of our continuing mission to provide you with exceptional heart care, we have created designated Provider Care Teams.  These Care Teams include your primary Cardiologist (physician) and Advanced Practice Providers (APPs -  Physician Assistants and Nurse Practitioners) who all work together to provide you with the care you need, when you need it.  We recommend signing up for the patient portal called "MyChart".  Sign up information is provided on this After Visit Summary.  MyChart is used to connect with patients for Virtual Visits (Telemedicine).  Patients are able to view lab/test results, encounter notes, upcoming appointments, etc.  Non-urgent messages can be sent to your provider as well.   To learn more about what you can do with MyChart, go to [email or web address].    Your next appointment:   4 month(s) (January)  The format for your next appointment:   In Person  Provider:  Sande Rives  PA-C   Other Instructions  [place] UPLOAD IN [place] IN 2 WEEKS   Signed, Darreld Mclean, PA-C  05/28/2021 11:22 AM    [place]

## 2021-05-28 ENCOUNTER — Other Ambulatory Visit: Payer: Self-pay

## 2021-05-28 ENCOUNTER — Encounter: Payer: Self-pay | Admitting: Student

## 2021-05-28 ENCOUNTER — Ambulatory Visit: Payer: Medicare HMO | Admitting: Student

## 2021-05-28 ENCOUNTER — Encounter: Payer: Self-pay | Admitting: *Deleted

## 2021-05-28 VITALS — BP 150/72 | HR 70 | Ht 73.0 in | Wt 226.0 lb

## 2021-05-28 DIAGNOSIS — R0683 Snoring: Secondary | ICD-10-CM

## 2021-05-28 DIAGNOSIS — R0600 Dyspnea, unspecified: Secondary | ICD-10-CM

## 2021-05-28 DIAGNOSIS — Z01818 Encounter for other preprocedural examination: Secondary | ICD-10-CM

## 2021-05-28 DIAGNOSIS — I447 Left bundle-branch block, unspecified: Secondary | ICD-10-CM

## 2021-05-28 DIAGNOSIS — R0609 Other forms of dyspnea: Secondary | ICD-10-CM

## 2021-05-28 DIAGNOSIS — E785 Hyperlipidemia, unspecified: Secondary | ICD-10-CM

## 2021-05-28 DIAGNOSIS — I10 Essential (primary) hypertension: Secondary | ICD-10-CM

## 2021-05-28 NOTE — Patient Instructions (Addendum)
Medication Instructions:   Your physician recommends that you continue on your current medications as directed. Please refer to the Current Medication list given to you today.   *If you need a refill on your cardiac medications before your next appointment, please call your pharmacy*   Lab Work: [place]   If you have labs (blood work) drawn today and your tests are completely normal, you will receive your results only by: [place] (if you have MyChart) OR A paper copy in the mail If you have any lab test that is abnormal or we need to change your treatment, we will call you to review the results.   Testing/Procedures: Your physician has requested that you have an echocardiogram. Echocardiography is a painless test that uses sound waves to create images of your heart. It provides your doctor with information about the size and shape of your heart and how well your heart's chambers and valves are working. This procedure takes approximately one hour. There are no restrictions for this procedure.  Your physician has requested that you have a lexiscan myoview. For further information please visit [email or web address]. Please follow instruction sheet, as given.   Follow-Up: At [facility], you and your health needs are our priority.  As part of our continuing mission to provide you with exceptional heart care, we have created designated Provider Care Teams.  These Care Teams include your primary Cardiologist (physician) and Advanced Practice Providers (APPs -  Physician Assistants and Nurse Practitioners) who all work together to provide you with the care you need, when you need it.  We recommend signing up for the patient portal called "MyChart".  Sign up information is provided on this After Visit Summary.  MyChart is used to connect with patients for Virtual Visits (Telemedicine).  Patients are able to view lab/test results, encounter notes, upcoming appointments, etc.   Non-urgent messages can be sent to your provider as well.   To learn more about what you can do with MyChart, go to [email or web address].    Your next appointment:   4 month(s) (January)  The format for your next appointment:   In Person  Provider:  Sande Rives PA-C   Other Instructions  [place] UPLOAD IN [place] 2 WEEKS

## 2021-06-12 ENCOUNTER — Telehealth (HOSPITAL_COMMUNITY): Payer: Self-pay | Admitting: *Deleted

## 2021-06-12 NOTE — Telephone Encounter (Signed)
Close encounter 

## 2021-06-17 ENCOUNTER — Ambulatory Visit: Payer: Medicare HMO | Admitting: Physician Assistant

## 2021-06-18 ENCOUNTER — Ambulatory Visit (HOSPITAL_BASED_OUTPATIENT_CLINIC_OR_DEPARTMENT_OTHER): Payer: Medicare HMO

## 2021-06-18 ENCOUNTER — Telehealth (HOSPITAL_COMMUNITY): Payer: Self-pay | Admitting: *Deleted

## 2021-06-18 ENCOUNTER — Other Ambulatory Visit: Payer: Self-pay

## 2021-06-18 DIAGNOSIS — I447 Left bundle-branch block, unspecified: Secondary | ICD-10-CM | POA: Diagnosis not present

## 2021-06-18 LAB — ECHOCARDIOGRAM COMPLETE
Area-P 1/2: 5.66 cm2
P 1/2 time: 465 msec
S' Lateral: 4.1 cm

## 2021-06-18 NOTE — Telephone Encounter (Signed)
Close encounter 

## 2021-06-19 ENCOUNTER — Ambulatory Visit (HOSPITAL_COMMUNITY)
Admission: RE | Admit: 2021-06-19 | Discharge: 2021-06-19 | Disposition: A | Discharge status: Home / Self Care | Payer: Medicare HMO | Source: Ambulatory Visit | Attending: Cardiology | Admitting: Cardiology

## 2021-06-19 DIAGNOSIS — I447 Left bundle-branch block, unspecified: Secondary | ICD-10-CM | POA: Insufficient documentation

## 2021-06-19 LAB — MYOCARDIAL PERFUSION IMAGING
LV dias vol: 234 mL (ref 62–150)
LV sys vol: 143 mL
Nuc Stress EF: 39 %
Peak HR: 83 {beats}/min
Rest HR: 76 {beats}/min
Rest Nuclear Isotope Dose: 10.7 mCi
SDS: 3
SRS: 2
SSS: 5
ST Depression (mm): 0 mm
Stress Nuclear Isotope Dose: 31.4 mCi
TID: 0.92

## 2021-06-19 MED ORDER — REGADENOSON 0.4 MG/5ML IV SOLN
0.4000 mg | Freq: Once | INTRAVENOUS | Status: AC
  Administered 2021-06-19: 0.4 mg via INTRAVENOUS

## 2021-06-19 MED ORDER — TECHNETIUM TC 99M TETROFOSMIN IV KIT
31.4000 | PACK | Freq: Once | INTRAVENOUS | Status: AC | PRN
  Administered 2021-06-19: 31.4 via INTRAVENOUS
  Filled 2021-06-19: qty 32

## 2021-06-19 MED ORDER — TECHNETIUM TC 99M TETROFOSMIN IV KIT
10.7000 | PACK | Freq: Once | INTRAVENOUS | Status: AC | PRN
  Administered 2021-06-19: 10.7 via INTRAVENOUS
  Filled 2021-06-19: qty 11

## 2021-07-28 NOTE — Progress Notes (Signed)
Surgical Instructions    Your procedure is scheduled on Monday November 14th.  Report to [facility] Main Entrance "A" at 7:30 A.M., then check in with the Admitting office.  Call this number if you have problems the morning of surgery:  [phone]   If you have any questions prior to your surgery date call [phone]: Open Monday-Friday 8am-4pm    Remember:  Do not eat after midnight the night before your surgery  You may drink clear liquids until 6:30am the morning of your surgery.   Clear liquids allowed are: Water, Non-Citrus Juices (without pulp), Carbonated Beverages, Clear Tea, Black Coffee ONLY (NO MILK, CREAM OR POWDERED CREAMER of any kind), and Gatorade    Take these medicines the morning of surgery with A SIP OF WATER carvedilol (COREG) 3.125 MG tablet gabapentin (NEURONTIN) 300 MG capsule pantoprazole (PROTONIX) 40 MG tablet simvastatin (ZOCOR) 40 MG tablet  IF NEEDED Carboxymethylcellul-Glycerin (LUBRICATING EYE DROPS OP) clonazePAM (KLONOPIN) 0.5 MG tablet cyclobenzaprine (FLEXERIL) 10 MG tablet HYDROcodone-acetaminophen (NORCO) 10-325 MG tablet rOPINIRole (REQUIP) 1 MG tablet  Follow your surgeon's instructions on when to stop Aspirin.  If no instructions were given by your surgeon then you will need to call the office to get those instructions.    As of today, STOP taking any Aspirin (unless otherwise instructed by your surgeon) Mobic, Aleve, Naproxen, Ibuprofen, Motrin, Advil, Goody's, BC's, all herbal medications, fish oil, and all vitamins.     After your COVID test   You are not required to quarantine however you are required to wear a well-fitting mask when you are out and around people not in your household.  If your mask becomes wet or soiled, replace with a new one.  Wash your hands often with soap and water for 20 seconds or clean your hands with an alcohol-based hand sanitizer that contains at least 60% alcohol.  Do not share personal  items.  Notify your provider: if you are in close contact with someone who has COVID  or if you develop a fever of 100.4 or greater, sneezing, cough, sore throat, shortness of breath or body aches.             Do not wear jewelry Do not wear lotions, powders, colognes, or deodorant. Do not shave 48 hours prior to surgery.  Men may shave face and neck. Do not bring valuables to the hospital. DO Not wear nail polish, gel polish, artificial nails, or any other type of covering on natural nails including finger and toenails. If patients have artificial nails, gel coating, etc. that need to be removed by a nail salon, please have this removed prior to surgery or surgery may need to be canceled/delayed if the surgeon/ anesthesia feels like the patient is unable to be adequately monitored.             [facility] is not responsible for any belongings or valuables.  Do NOT Smoke (Tobacco/Vaping)  24 hours prior to your procedure  If you use a CPAP at night, you may bring your mask for your overnight stay.   Contacts, glasses, hearing aids, dentures or partials may not be worn into surgery, please bring cases for these belongings   For patients admitted to the hospital, discharge time will be determined by your treatment team.   Patients discharged the day of surgery will not be allowed to drive home, and someone needs to stay with them for 24 hours.  NO VISITORS WILL BE ALLOWED IN PRE-OP WHERE  PATIENTS ARE PREPPED FOR SURGERY.  ONLY 1 SUPPORT PERSON MAY BE PRESENT IN THE WAITING ROOM WHILE YOU ARE IN SURGERY.  IF YOU ARE TO BE ADMITTED, ONCE YOU ARE IN YOUR ROOM YOU WILL BE ALLOWED TWO (2) VISITORS. 1 (ONE) VISITOR MAY STAY OVERNIGHT BUT MUST ARRIVE TO THE ROOM BY 8pm.  Minor children may have two parents present. Special consideration for safety and communication needs will be reviewed on a case by case basis.  Special instructions:    Oral Hygiene is also important to reduce your risk of  infection.  Remember - BRUSH YOUR TEETH THE MORNING OF SURGERY WITH YOUR REGULAR TOOTHPASTE   [facility]- Preparing For Surgery  Before surgery, you can play an important role. Because skin is not sterile, your skin needs to be as free of germs as possible. You can reduce the number of germs on your skin by washing with CHG (chlorahexidine gluconate) Soap before surgery.  CHG is an antiseptic cleaner which kills germs and bonds with the skin to continue killing germs even after washing.     Please do not use if you have an allergy to CHG or antibacterial soaps. If your skin becomes reddened/irritated stop using the CHG.  Do not shave (including legs and underarms) for at least 48 hours prior to first CHG shower. It is OK to shave your face.  Please follow these instructions carefully.     Shower the NIGHT BEFORE SURGERY and the MORNING OF SURGERY with CHG Soap.   If you chose to wash your hair, wash your hair first as usual with your normal shampoo. After you shampoo, rinse your hair and body thoroughly to remove the shampoo.  Then [employer] and genitals (private parts) with your normal soap and rinse thoroughly to remove soap.  After that Use CHG Soap as you would any other liquid soap. You can apply CHG directly to the skin and wash gently with a scrungie or a clean washcloth.   Apply the CHG Soap to your body ONLY FROM THE NECK DOWN.  Do not use on open wounds or open sores. Avoid contact with your eyes, ears, mouth and genitals (private parts). Wash Face and genitals (private parts)  with your normal soap.   Wash thoroughly, paying special attention to the area where your surgery will be performed.  Thoroughly rinse your body with warm water from the neck down.  DO NOT shower/wash with your normal soap after using and rinsing off the CHG Soap.  Pat yourself dry with a CLEAN TOWEL.  Wear CLEAN PAJAMAS to bed the night before surgery  Place CLEAN SHEETS on your bed the night before  your surgery  DO NOT SLEEP WITH PETS.   Day of Surgery:  Take a shower with CHG soap. Wear Clean/Comfortable clothing the morning of surgery Do not apply any deodorants/lotions.   Remember to brush your teeth WITH YOUR REGULAR TOOTHPASTE.   Please read over the following fact sheets that you were given.

## 2021-07-29 ENCOUNTER — Encounter (HOSPITAL_COMMUNITY)
Admission: RE | Admit: 2021-07-29 | Discharge: 2021-07-29 | Disposition: A | Discharge status: Home / Self Care | Payer: Medicare HMO | Source: Ambulatory Visit | Attending: General Surgery | Admitting: General Surgery

## 2021-07-29 ENCOUNTER — Other Ambulatory Visit: Payer: Self-pay

## 2021-07-29 ENCOUNTER — Encounter (HOSPITAL_COMMUNITY): Payer: Self-pay

## 2021-07-29 VITALS — BP 152/83 | HR 71 | Temp 98.7°F | Resp 18 | Ht 73.0 in | Wt 228.6 lb

## 2021-07-29 DIAGNOSIS — Z01812 Encounter for preprocedural laboratory examination: Secondary | ICD-10-CM | POA: Insufficient documentation

## 2021-07-29 DIAGNOSIS — Z01818 Encounter for other preprocedural examination: Secondary | ICD-10-CM

## 2021-07-29 DIAGNOSIS — I10 Essential (primary) hypertension: Secondary | ICD-10-CM | POA: Diagnosis not present

## 2021-07-29 HISTORY — DX: Atherosclerotic heart disease of native coronary artery without angina pectoris: I25.10

## 2021-07-29 LAB — CBC
HCT: 39 % (ref 39.0–52.0)
Hemoglobin: 12.3 g/dL — ABNORMAL LOW (ref 13.0–17.0)
MCH: 31.1 pg (ref 26.0–34.0)
MCHC: 31.5 g/dL (ref 30.0–36.0)
MCV: 98.5 fL (ref 80.0–100.0)
Platelets: 147 10*3/uL — ABNORMAL LOW (ref 150–400)
RBC: 3.96 MIL/uL — ABNORMAL LOW (ref 4.22–5.81)
RDW: 15.6 % — ABNORMAL HIGH (ref 11.5–15.5)
WBC: 3.7 10*3/uL — ABNORMAL LOW (ref 4.0–10.5)
nRBC: 0 % (ref 0.0–0.2)

## 2021-07-29 LAB — BASIC METABOLIC PANEL
Anion gap: 11 (ref 5–15)
BUN: 17 mg/dL (ref 8–23)
CO2: 25 mmol/L (ref 22–32)
Calcium: 8.6 mg/dL — ABNORMAL LOW (ref 8.9–10.3)
Chloride: 104 mmol/L (ref 98–111)
Creatinine, Ser: 1.39 mg/dL — ABNORMAL HIGH (ref 0.61–1.24)
GFR, Estimated: 55 mL/min — ABNORMAL LOW (ref >60)
Glucose, Bld: 96 mg/dL (ref 70–99)
Potassium: 4.1 mmol/L (ref 3.5–5.1)
Sodium: 140 mmol/L (ref 135–145)

## 2021-07-29 NOTE — Progress Notes (Signed)
Surgical Instructions    Your procedure is scheduled on Monday November 14th.  Report to [facility] Main Entrance "A" at 7:30 A.M., then check in with the Admitting office.  Call this number if you have problems the morning of surgery:  [phone]   If you have any questions prior to your surgery date call [phone]: Open Monday-Friday 8am-4pm    Remember:  Do not eat after midnight the night before your surgery  You may drink clear liquids until 6:30am the morning of your surgery.   Clear liquids allowed are: Water, Non-Citrus Juices (without pulp), Carbonated Beverages, Clear Tea, Black Coffee ONLY (NO MILK, CREAM OR POWDERED CREAMER of any kind), and Gatorade    Take these medicines the morning of surgery with A SIP OF WATER carvedilol (COREG)  gabapentin (NEURONTIN)  pantoprazole (PROTONIX)  simvastatin (ZOCOR)   IF NEEDED Carboxymethylcellul-Glycerin (LUBRICATING EYE DROPS OP) clonazePAM (KLONOPIN) cyclobenzaprine (FLEXERIL) HYDROcodone-acetaminophen (NORCO)  rOPINIRole (REQUIP)    As of today, STOP taking any Aspirin (unless otherwise instructed by your surgeon) Mobic, Aleve, Naproxen, Ibuprofen, Motrin, Advil, Goody's, BC's, all herbal medications, fish oil, and all vitamins.     After your COVID test   You are not required to quarantine however you are required to wear a well-fitting mask when you are out and around people not in your household.  If your mask becomes wet or soiled, replace with a new one.  Wash your hands often with soap and water for 20 seconds or clean your hands with an alcohol-based hand sanitizer that contains at least 60% alcohol.  Do not share personal items.  Notify your provider: if you are in close contact with someone who has COVID  or if you develop a fever of 100.4 or greater, sneezing, cough, sore throat, shortness of breath or body aches.             Do not wear jewelry Do not wear lotions, powders, colognes, or  deodorant. Men may shave face and neck. Do not bring valuables to the hospital.  DO Not wear nail polish, gel polish, artificial nails, or any other type of covering on natural nails including finger and toenails. If patients have artificial nails, gel coating, etc. that need to be removed by a nail salon, please have this removed prior to surgery or surgery may need to be canceled/delayed if the surgeon/ anesthesia feels like the patient is unable to be adequately monitored.             [facility] is not responsible for any belongings or valuables.  Do NOT Smoke (Tobacco/Vaping)  24 hours prior to your procedure  If you use a CPAP at night, you may bring your mask for your overnight stay.   Contacts, glasses, hearing aids, dentures or partials may not be worn into surgery, please bring cases for these belongings   For patients admitted to the hospital, discharge time will be determined by your treatment team.   Patients discharged the day of surgery will not be allowed to drive home, and someone needs to stay with them for 24 hours.  NO VISITORS WILL BE ALLOWED IN PRE-OP WHERE PATIENTS ARE PREPPED FOR SURGERY.  ONLY 1 SUPPORT PERSON MAY BE PRESENT IN THE WAITING ROOM WHILE YOU ARE IN SURGERY.  IF YOU ARE TO BE ADMITTED, ONCE YOU ARE IN YOUR ROOM YOU WILL BE ALLOWED TWO (2) VISITORS. 1 (ONE) VISITOR MAY STAY OVERNIGHT BUT MUST ARRIVE TO THE ROOM BY 8pm.  Minor  children may have two parents present. Special consideration for safety and communication needs will be reviewed on a case by case basis.  Special instructions:    Oral Hygiene is also important to reduce your risk of infection.  Remember - BRUSH YOUR TEETH THE MORNING OF SURGERY WITH YOUR REGULAR TOOTHPASTE   [place] [facility]- Preparing For Surgery  Before surgery, you can play an important role. Because skin is not sterile, your skin needs to be as free of germs as possible. You can reduce the number of germs on your skin by washing  with CHG (chlorahexidine gluconate) Soap before surgery.  CHG is an antiseptic cleaner which kills germs and bonds with the skin to continue killing germs even after washing.     Please do not use if you have an allergy to CHG or antibacterial soaps. If your skin becomes reddened/irritated stop using the CHG.  Do not shave (including legs and underarms) for at least 48 hours prior to first CHG shower. It is OK to shave your face.  Please follow these instructions carefully.     Shower the NIGHT BEFORE SURGERY and the MORNING OF SURGERY with CHG Soap.   If you chose to wash your hair, wash your hair first as usual with your normal shampoo. After you shampoo, rinse your hair and body thoroughly to remove the shampoo.  Then [employer] and genitals (private parts) with your normal soap and rinse thoroughly to remove soap.  After that Use CHG Soap as you would any other liquid soap. You can apply CHG directly to the skin and wash gently with a scrungie or a clean washcloth.   Apply the CHG Soap to your body ONLY FROM THE NECK DOWN.  Do not use on open wounds or open sores. Avoid contact with your eyes, ears, mouth and genitals (private parts). Wash Face and genitals (private parts)  with your normal soap.   Wash thoroughly, paying special attention to the area where your surgery will be performed.  Thoroughly rinse your body with warm water from the neck down.  DO NOT shower/wash with your normal soap after using and rinsing off the CHG Soap.  Pat yourself dry with a CLEAN TOWEL.  Wear CLEAN PAJAMAS to bed the night before surgery  Place CLEAN SHEETS on your bed the night before your surgery  DO NOT SLEEP WITH PETS.   Day of Surgery:  Take a shower with CHG soap. Wear Clean/Comfortable clothing the morning of surgery Do not apply any deodorants/lotions.   Remember to brush your teeth WITH YOUR REGULAR TOOTHPASTE.   Please read over the following fact sheets that you were given.

## 2021-07-29 NOTE — Progress Notes (Signed)
PCP - Kathryne Eriksson Cardiologist - Candee Furbish  PPM/ICD - denies   Chest x-ray - n/a EKG - 05/28/21 Stress Test - 06/19/21 ECHO - 06/18/21 Cardiac Cath - denies  Sleep Study - denies, recommended by cardiologist but he refused   No diabetes  As of today, STOP taking any Aspirin (unless otherwise instructed by your surgeon) Mobic, Aleve, Naproxen, Ibuprofen, Motrin, Advil, Goody's, BC's, all herbal medications, fish oil, and all vitamins.  ERAS Protcol -yes PRE-SURGERY Ensure or G2- none ordered  COVID TEST- ambulatory surgery, not needed   Anesthesia review: yes, cardiac history. Cardiac clearance note 05/28/21  Patient denies shortness of breath, fever, cough and chest pain at PAT appointment   All instructions explained to the patient, with a verbal understanding of the material. Patient agrees to go over the instructions while at home for a better understanding. Patient also instructed to self quarantine after being tested for COVID-19. The opportunity to ask questions was provided.

## 2021-07-30 ENCOUNTER — Encounter (HOSPITAL_COMMUNITY): Payer: Self-pay

## 2021-07-30 NOTE — Progress Notes (Signed)
Anesthesia Chart Review:  Case: [id number] Date/Time: 08/03/21 0915   Procedure: SUPRA-UMBILICAL HERNIA REPAIR WITH MESH   Anesthesia type: General   Pre-op diagnosis: SUPRAUMBILICAL HERNIA   Location: [facility] OR ROOM 02 / [facility]   Surgeons: Georganna Skeans, MD       DISCUSSION: Patient is a 70 year old male scheduled for the above procedure. Surgery was initially planned 05/11/21, but delayed for cardiology evaluation given new LBBB. Since then he had cardiology evaluation with stress and echo. 06/19/21 stress test was equivocal, EF ~ 39%, however perfusion defect also noted on stress imaging in 2015 and wall motion abnormality more consistent with his LBBB then infarct. EF by echo 44% with global hypokinesis. Sande Rives, PA-C discussed results with cardiologist Dr. Marlou Porch who felt "[facility] to proceed with hernia repair surgery..."  History includes former smoker (quit 10/09/98), post-operative N/V, HTN, CAD (denied history of LHC), LBBB (05/05/21), GERD, GI bleed (duodenal ulcer 04/2000), anxiety, RLS, left adrenal adenoma (stable 4 cm 09/24/17 CT), HLD, peripheral neuropathy, absent right kidney, AKI (due to obstructive left ureteral stone, s/p stent/removal 09/2017), prostatitis, arthritis (s/p left TKA 10/09/01, patellar revision 08/04/17; left THA 10/02/18; right THA 07/30/19), spinal surgery (C5-7 ACDF 05/21/09). Snoring with OSA suspected, but he declined sleep study (05/28/21 cardiology note).   For anesthesia history, he reported delayed effect of anesthesia ("I felt them cut" with back surgery and that his father had a "hard time waking up" after anesthesia.   He denied shortness of breath, cough, fever, chest pain at PAT RN visit.  Anesthesia team to evaluate on the day of surgery.   VS: BP (!) 152/83   Pulse 71   Temp 37.1 C (Oral)   Resp 18   Ht 6\' 1"  (1.854 m)   Wt 103.7 kg   SpO2 98%   BMI 30.16 kg/m    PROVIDERS: Christain Sacramento, MD is PCP Candee Furbish, MD is cardiologist. Seen in 2013 for  chest pain. Lexiscan Myoview was ordered and showed a fixed defect in the inferoseptal region but no ischemia. EF 63%. He was not felt to need any follow-up with Cardiology. Referred back for preoperative evaluation after 05/05/21 EKG showed new LBBB. Seen by Sande Rives, PA-C on 05/28/21. Stress and echo done as outlined below in CV.    LABS: Labs reviewed: Acceptable for surgery. Cr 1.39, previously 1.22 05/05/21.  (all labs ordered are listed, but only abnormal results are displayed)  Labs Reviewed  BASIC METABOLIC PANEL - Abnormal; Notable for the following components:      Result Value   Creatinine, Ser 1.39 (*)    Calcium 8.6 (*)    GFR, Estimated 55 (*)    All other components within normal limits  CBC - Abnormal; Notable for the following components:   WBC 3.7 (*)    RBC 3.96 (*)    Hemoglobin 12.3 (*)    RDW 15.6 (*)    Platelets 147 (*)    All other components within normal limits     EKG: 05/28/21: Sinus rhythm with first-degree AV block with premature atrial complexes Left bundle branch block   CV: Myocardial Perfusion Study 06/19/21:   Findings are equivocal. The study is intermediate risk.   No ST deviation was noted.   LV perfusion is equivocal. There is evidence of ischemia. 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   Left ventricular function is abnormal. There were multiple regional abnormalities. Nuclear stress EF: 39 %. The left ventricular ejection fraction is moderately decreased (30-44%).  End systolic cavity size is normal.   Prior study available for comparison from 12/17/2013. No changes compared to prior study.   There is reduced perfusion in the mid-apical inferior myocardium that is slightly worse at stress than rest consistent with infarct with peri-infarct ischemia.  However, this was also noted on stress imaging in 2015.  Wall motion abnormality is more consistent with LBBB than infarct.  Suspect this is related to diaphragmatic attenuation rather than infarct  with peri-infarct ischemia.  If clinical suspicion is high, consider coronary CT-A or invasive angiography. - Results reviewed by Sande Rives, PA-C on 06/19/21, "Please notify patient of results: Stress test showed mild defect which was felt to likely be artifact but could also represent a small area of ischemia. However, no significant changes from prior study in 2015. Also reviewed Echo which showed mildly reduced pumping function of the heart which may be due to LBBB. Discussed with Dr. Marlou Porch - OK to proceed with hernia repair surgery..."   Echo 06/18/21: IMPRESSIONS   1. Left ventricular ejection fraction by 3D volume is 44 %. The left  ventricle has mild to moderately decreased function. The left ventricle  demonstrates global hypokinesis. Left ventricular diastolic parameters are  consistent with Grade I diastolic  dysfunction (impaired relaxation).   2. Right ventricular systolic function is normal. The right ventricular  size is normal.   3. The mitral valve is normal in structure. No evidence of mitral valve  regurgitation. No evidence of mitral stenosis.   4. The aortic valve is normal in structure. Aortic valve regurgitation is  mild to moderate. Mild aortic valve sclerosis is present, with no evidence  of aortic valve stenosis.   5. The inferior vena cava is normal in size with greater than 50%  respiratory variability, suggesting right atrial pressure of 3 mmHg.    Past Medical History:  Diagnosis Date   Adrenal adenoma, left    Arthritis    Bursitis of elbow    Complication of anesthesia    ANESTHESIA DID NOT TAKE EFFECT WITH BACK SURGERY "I FELT THEM CUT"   Congenital absence of right kidney    Coronary artery disease    Diverticulosis of colon    Family history of anesthesia complication    "hard time waking up my dad" many yrs ago   Frequency of urination    Full dentures    GAD (generalized anxiety disorder)    GERD (gastroesophageal reflux disease)     History of acute renal failure    12-16-2013;  09-24-2017 due to stone obstruction   History of duodenal ulcer 04/2000   w/ gi bleed post egd w/ cauterization (caused by chronic nsaid use)   History of encephalopathy 04/26/2014   ACUTE EPISODE SECONDARY TO DRUG OVERDOSE (NARC, BZD, FLEXERIL)   History of GI bleed    08/ 2001   duodenal ulcer bleed post egd w/ cauterization   History of kidney stones    History of prostatitis    Hyperlipidemia    Hypertension    Left bundle branch block 05/05/2021   Left ureteral stone    Nocturia more than twice per night    Peripheral neuropathy    bilateral leg numbness due to back issues, walks w/ cane   Pneumonia    PONV (postoperative nausea and vomiting)    Restless leg syndrome    Solitary left kidney    Wears glasses     Past Surgical History:  Procedure Laterality Date  ANTERIOR CERVICAL DECOMP/DISCECTOMY FUSION  05-21-2009   dr Maia Plan [facility]   C5-6, C6-7 and aviator plate   CARDIOVASCULAR STRESS TEST  12/17/2013   FIXED DEFECT IN THE INFEROSEPTAL REGION BUT NO STRESS INDUCED ISCHEMIA/  NORMAL LV FUNCTION AND WALL MOTION , EF 63%   CLOSED MANIPULATION POST TOTAL KNEE ARTHROPLASTY Left 11-28-2001    dr Wynelle Link   CYSTOSCOPY WITH RETROGRADE PYELOGRAM, URETEROSCOPY AND STENT PLACEMENT Left 10/07/2017   Procedure: CYSTOSCOPY WITH LEFT RETROGRADE PYELOGRAM, BASKETRY OF STONE, URETEROSCOPY AND STENT REMOVAL;  Surgeon: Alexis Frock, MD;  Location: [facility];  Service: Urology;  Laterality: Left;   CYSTOSCOPY WITH STENT PLACEMENT Left 09/24/2017   Procedure: RENAL CYSTOSCOPY LEFT RETROGRADE, LEFT URETERAL STENT PLACEMENT;  Surgeon: Alexis Frock, MD;  Location: [facility];  Service: Urology;  Laterality: Left;   HIP ARTHROSCOPY W/ LABRAL DEBRIDEMENT Left 08/12/2006   dr Wynelle Link [facility]   and chondraplasty   INCISION AND DRAINAGE ABSCESS Right 03/06/2015   Procedure: INCISION AND DRAINAGE ABSCESS RIGHT ARM;  Surgeon: Daryll Brod, MD;   Location: [facility];  Service: Orthopedics;  Laterality: Right;   KNEE ARTHROSCOPY Left x2  2002   LUMBAR SPINE SURGERY  x5  last one 2002   SHOULDER ARTHROSCOPY WITH SUBACROMIAL DECOMPRESSION, ROTATOR CUFF REPAIR AND BICEP TENDON REPAIR Right 06-27-2002  dr Wynelle Link [facility]   labral debridement and DCR   SHOULDER OPEN ROTATOR CUFF REPAIR Left 07/11/2014   Procedure: ROTATOR CUFF REPAIR SHOULDER OPEN WITH  GRAFT ;  Surgeon: Tobi Bastos, MD;  Location: [facility];  Service: Orthopedics;  Laterality: Left;   TOTAL HIP ARTHROPLASTY Left 10/02/2018   Procedure: TOTAL HIP ARTHROPLASTY ANTERIOR APPROACH;  Surgeon: Gaynelle Arabian, MD;  Location: [facility];  Service: Orthopedics;  Laterality: Left;  119min   TOTAL HIP ARTHROPLASTY Right 07/30/2019   Procedure: TOTAL HIP ARTHROPLASTY ANTERIOR APPROACH;  Surgeon: Gaynelle Arabian, MD;  Location: [facility];  Service: Orthopedics;  Laterality: Right;  157min   TOTAL KNEE ARTHROPLASTY Left 10-09-2001   dr Wynelle Link [facility]   TOTAL KNEE REVISION Left 08/03/2017   Procedure: Left knee tibia polyethylene and patella revision;  Surgeon: Gaynelle Arabian, MD;  Location: [facility];  Service: Orthopedics;  Laterality: Left;  with abductor block    MEDICATIONS:  aspirin EC 81 MG tablet   Carboxymethylcellul-Glycerin (LUBRICATING EYE DROPS OP)   carvedilol (COREG) 3.125 MG tablet   clonazePAM (KLONOPIN) 0.5 MG tablet   cyclobenzaprine (FLEXERIL) 10 MG tablet   furosemide (LASIX) 20 MG tablet   gabapentin (NEURONTIN) 300 MG capsule   HYDROcodone-acetaminophen (NORCO) 10-325 MG tablet   meloxicam (MOBIC) 15 MG tablet   pantoprazole (PROTONIX) 40 MG tablet   rOPINIRole (REQUIP) 1 MG tablet   simvastatin (ZOCOR) 40 MG tablet   triamcinolone cream (KENALOG) 0.1 %   No current facility-administered medications for this encounter.    Myra Gianotti, PA-C Surgical Short Stay/Anesthesiology [facility] Phone [phone] [facility] Phone [phone] 07/30/2021 1:35  PM

## 2021-07-30 NOTE — Anesthesia Preprocedure Evaluation (Addendum)
Anesthesia Evaluation  Patient identified by MRN, date of birth, ID band Patient awake  General Assessment Comment:History noted Dr. Nyoka Cowden  Reviewed: Allergy & Precautions, NPO status , Patient's Chart, lab work & pertinent test results  History of Anesthesia Complications (+) PONV  Airway Mallampati: II  TM Distance: >3 FB     Dental   Pulmonary pneumonia, former smoker,    breath sounds clear to auscultation       Cardiovascular hypertension, + CAD  + dysrhythmias  Rhythm:Regular Rate:Normal  Echo 06/18/21: IMPRESSIONS  1. Left ventricular ejection fraction by 3D volume is 44 %. The left  ventricle has mild to moderately decreased function. The left ventricle  demonstrates global hypokinesis. Left ventricular diastolic parameters are  consistent with Grade I diastolic  dysfunction (impaired relaxation).  2. Right ventricular systolic function is normal. The right ventricular  size is normal.  3. The mitral valve is normal in structure. No evidence of mitral valve  regurgitation. No evidence of mitral stenosis.  4. The aortic valve is normal in structure. Aortic valve regurgitation is  mild to moderate. Mild aortic valve sclerosis is present, with no evidence  of aortic valve stenosis.  5. The inferior vena cava is normal in size with greater than 50%  respiratory variability, suggesting right atrial pressure of 3 mmHg.     Neuro/Psych  Neuromuscular disease    GI/Hepatic Neg liver ROS, GERD  ,  Endo/Other    Renal/GU Renal disease     Musculoskeletal  (+) Arthritis ,   Abdominal   Peds  Hematology   Anesthesia Other Findings   Reproductive/Obstetrics                           Anesthesia Physical Anesthesia Plan  ASA: 3  Anesthesia Plan: General   Post-op Pain Management:    Induction: Intravenous  PONV Risk Score and Plan: 3 and Ondansetron, Dexamethasone and  Midazolam  Airway Management Planned: Oral ETT  Additional Equipment:   Intra-op Plan:   Post-operative Plan: Extubation in OR  Informed Consent: I have reviewed the patients History and Physical, chart, labs and discussed the procedure including the risks, benefits and alternatives for the proposed anesthesia with the patient or authorized representative who has indicated his/her understanding and acceptance.     Dental advisory given  Plan Discussed with: CRNA and Anesthesiologist  Anesthesia Plan Comments: (PAT note written 07/30/2021 by Myra Gianotti, PA-C. )       Anesthesia Quick Evaluation

## 2021-08-03 ENCOUNTER — Ambulatory Visit (HOSPITAL_COMMUNITY): Payer: Medicare HMO | Admitting: General Practice

## 2021-08-03 ENCOUNTER — Encounter (HOSPITAL_COMMUNITY)
Admission: RE | Disposition: A | Discharge status: Home / Self Care | Payer: Self-pay | Source: Home / Self Care | Attending: General Surgery

## 2021-08-03 ENCOUNTER — Ambulatory Visit (HOSPITAL_COMMUNITY): Payer: Medicare HMO | Admitting: Vascular Surgery

## 2021-08-03 ENCOUNTER — Other Ambulatory Visit: Payer: Self-pay

## 2021-08-03 ENCOUNTER — Encounter (HOSPITAL_COMMUNITY): Payer: Self-pay | Admitting: General Surgery

## 2021-08-03 ENCOUNTER — Ambulatory Visit (HOSPITAL_COMMUNITY)
Admission: RE | Admit: 2021-08-03 | Discharge: 2021-08-03 | Disposition: A | Discharge status: Home / Self Care | Payer: Medicare HMO | Attending: General Surgery | Admitting: General Surgery

## 2021-08-03 DIAGNOSIS — K439 Ventral hernia without obstruction or gangrene: Secondary | ICD-10-CM | POA: Insufficient documentation

## 2021-08-03 DIAGNOSIS — M1990 Unspecified osteoarthritis, unspecified site: Secondary | ICD-10-CM | POA: Diagnosis not present

## 2021-08-03 DIAGNOSIS — I2510 Atherosclerotic heart disease of native coronary artery without angina pectoris: Secondary | ICD-10-CM | POA: Insufficient documentation

## 2021-08-03 DIAGNOSIS — Z87891 Personal history of nicotine dependence: Secondary | ICD-10-CM | POA: Diagnosis not present

## 2021-08-03 DIAGNOSIS — K219 Gastro-esophageal reflux disease without esophagitis: Secondary | ICD-10-CM | POA: Insufficient documentation

## 2021-08-03 DIAGNOSIS — G709 Myoneural disorder, unspecified: Secondary | ICD-10-CM | POA: Diagnosis not present

## 2021-08-03 DIAGNOSIS — I10 Essential (primary) hypertension: Secondary | ICD-10-CM | POA: Diagnosis not present

## 2021-08-03 HISTORY — PX: INSERTION OF MESH: SHX5868

## 2021-08-03 HISTORY — PX: SUPRA-UMBILICAL HERNIA: SHX6105

## 2021-08-03 SURGERY — SUPRA-UMBILICAL HERNIA
Anesthesia: General | Site: Abdomen

## 2021-08-03 MED ORDER — GABAPENTIN 300 MG PO CAPS
300.0000 mg | ORAL_CAPSULE | ORAL | Status: AC
  Administered 2021-08-03: 300 mg via ORAL
  Filled 2021-08-03: qty 1

## 2021-08-03 MED ORDER — BUPIVACAINE-EPINEPHRINE (PF) 0.25% -1:200000 IJ SOLN
INTRAMUSCULAR | Status: DC | PRN
  Administered 2021-08-03: 20 mL

## 2021-08-03 MED ORDER — FENTANYL CITRATE (PF) 100 MCG/2ML IJ SOLN
25.0000 ug | INTRAMUSCULAR | Status: DC | PRN
  Administered 2021-08-03 (×3): 50 ug via INTRAVENOUS

## 2021-08-03 MED ORDER — FENTANYL CITRATE (PF) 250 MCG/5ML IJ SOLN
INTRAMUSCULAR | Status: AC
  Filled 2021-08-03: qty 5

## 2021-08-03 MED ORDER — PROPOFOL 10 MG/ML IV BOLUS
INTRAVENOUS | Status: DC | PRN
  Administered 2021-08-03: 150 mg via INTRAVENOUS

## 2021-08-03 MED ORDER — OXYCODONE HCL 5 MG PO TABS: 5.0000 mg | ORAL_TABLET | Freq: Four times a day (QID) | ORAL | 0 refills | Status: DC | PRN

## 2021-08-03 MED ORDER — FENTANYL CITRATE (PF) 100 MCG/2ML IJ SOLN
INTRAMUSCULAR | Status: AC
  Filled 2021-08-03: qty 2

## 2021-08-03 MED ORDER — DEXAMETHASONE SODIUM PHOSPHATE 10 MG/ML IJ SOLN
INTRAMUSCULAR | Status: AC
  Filled 2021-08-03: qty 1

## 2021-08-03 MED ORDER — ORAL CARE MOUTH RINSE: 15.0000 mL | Freq: Once | OROMUCOSAL | Status: AC

## 2021-08-03 MED ORDER — ONDANSETRON HCL 4 MG/2ML IJ SOLN
INTRAMUSCULAR | Status: AC
  Filled 2021-08-03: qty 2

## 2021-08-03 MED ORDER — CHLORHEXIDINE GLUCONATE CLOTH 2 % EX PADS: 6.0000 | MEDICATED_PAD | Freq: Once | CUTANEOUS | Status: DC

## 2021-08-03 MED ORDER — ONDANSETRON HCL 4 MG/2ML IJ SOLN
INTRAMUSCULAR | Status: DC | PRN
  Administered 2021-08-03: 4 mg via INTRAVENOUS

## 2021-08-03 MED ORDER — CEFAZOLIN SODIUM-DEXTROSE 2-4 GM/100ML-% IV SOLN
2.0000 g | INTRAVENOUS | Status: AC
  Administered 2021-08-03: 2 g via INTRAVENOUS
  Filled 2021-08-03: qty 100

## 2021-08-03 MED ORDER — ROCURONIUM BROMIDE 10 MG/ML (PF) SYRINGE
PREFILLED_SYRINGE | INTRAVENOUS | Status: AC
  Filled 2021-08-03: qty 10

## 2021-08-03 MED ORDER — DEXAMETHASONE SODIUM PHOSPHATE 10 MG/ML IJ SOLN
INTRAMUSCULAR | Status: DC | PRN
  Administered 2021-08-03: 10 mg via INTRAVENOUS

## 2021-08-03 MED ORDER — SUGAMMADEX SODIUM 200 MG/2ML IV SOLN
INTRAVENOUS | Status: DC | PRN
  Administered 2021-08-03: 200 mg via INTRAVENOUS

## 2021-08-03 MED ORDER — BUPIVACAINE-EPINEPHRINE 0.5% -1:200000 IJ SOLN
INTRAMUSCULAR | Status: AC
  Filled 2021-08-03: qty 1

## 2021-08-03 MED ORDER — CHLORHEXIDINE GLUCONATE 0.12 % MT SOLN
15.0000 mL | Freq: Once | OROMUCOSAL | Status: AC
  Administered 2021-08-03: 15 mL via OROMUCOSAL
  Filled 2021-08-03: qty 15

## 2021-08-03 MED ORDER — BUPIVACAINE-EPINEPHRINE (PF) 0.25% -1:200000 IJ SOLN
INTRAMUSCULAR | Status: AC
  Filled 2021-08-03: qty 30

## 2021-08-03 MED ORDER — MIDAZOLAM HCL 2 MG/2ML IJ SOLN
INTRAMUSCULAR | Status: DC | PRN
Start: 2021-08-03 — End: 2021-08-03
  Administered 2021-08-03: 2 mg via INTRAVENOUS

## 2021-08-03 MED ORDER — LIDOCAINE 2% (20 MG/ML) 5 ML SYRINGE
INTRAMUSCULAR | Status: DC | PRN
  Administered 2021-08-03: 80 mg via INTRAVENOUS

## 2021-08-03 MED ORDER — 0.9 % SODIUM CHLORIDE (POUR BTL) OPTIME
TOPICAL | Status: DC | PRN
  Administered 2021-08-03: 1000 mL

## 2021-08-03 MED ORDER — SUCCINYLCHOLINE CHLORIDE 200 MG/10ML IV SOSY
PREFILLED_SYRINGE | INTRAVENOUS | Status: DC | PRN
  Administered 2021-08-03: 140 mg via INTRAVENOUS

## 2021-08-03 MED ORDER — LACTATED RINGERS IV SOLN: INTRAVENOUS | Status: DC

## 2021-08-03 MED ORDER — FENTANYL CITRATE (PF) 250 MCG/5ML IJ SOLN
INTRAMUSCULAR | Status: DC | PRN
  Administered 2021-08-03: 100 ug via INTRAVENOUS
  Administered 2021-08-03: 50 ug via INTRAVENOUS

## 2021-08-03 MED ORDER — ACETAMINOPHEN 500 MG PO TABS
1000.0000 mg | ORAL_TABLET | ORAL | Status: AC
  Administered 2021-08-03: 500 mg via ORAL
  Filled 2021-08-03: qty 2

## 2021-08-03 MED ORDER — MIDAZOLAM HCL 2 MG/2ML IJ SOLN
INTRAMUSCULAR | Status: AC
  Filled 2021-08-03: qty 2

## 2021-08-03 MED ORDER — LIDOCAINE 2% (20 MG/ML) 5 ML SYRINGE
INTRAMUSCULAR | Status: AC
  Filled 2021-08-03: qty 5

## 2021-08-03 MED ORDER — ROCURONIUM BROMIDE 10 MG/ML (PF) SYRINGE
PREFILLED_SYRINGE | INTRAVENOUS | Status: DC | PRN
  Administered 2021-08-03: 40 mg via INTRAVENOUS

## 2021-08-03 SURGICAL SUPPLY — 36 items
ADH SKN CLS APL DERMABOND .7 (GAUZE/BANDAGES/DRESSINGS) ×1
APL PRP STRL LF DISP 70% ISPRP (MISCELLANEOUS) ×1
BAG COUNTER SPONGE SURGICOUNT (BAG) ×2 IMPLANT
BAG SPNG CNTER NS LX DISP (BAG) ×1
BLADE CLIPPER SURG (BLADE) IMPLANT
CANISTER SUCT 3000ML PPV (MISCELLANEOUS) ×2 IMPLANT
CHLORAPREP W/TINT 26 (MISCELLANEOUS) ×2 IMPLANT
COVER SURGICAL LIGHT HANDLE (MISCELLANEOUS) ×2 IMPLANT
DERMABOND ADVANCED (GAUZE/BANDAGES/DRESSINGS) ×1
DERMABOND ADVANCED .7 DNX12 (GAUZE/BANDAGES/DRESSINGS) ×1 IMPLANT
DRAPE LAPAROTOMY 100X72 PEDS (DRAPES) ×2 IMPLANT
ELECT REM PT RETURN 9FT ADLT (ELECTROSURGICAL) ×2
ELECTRODE REM PT RTRN 9FT ADLT (ELECTROSURGICAL) ×1 IMPLANT
GLOVE SRG 8 PF TXTR STRL LF DI (GLOVE) ×1 IMPLANT
GLOVE SURG ENC MOIS LTX SZ8 (GLOVE) ×2 IMPLANT
GLOVE SURG UNDER POLY LF SZ8 (GLOVE) ×2
GOWN STRL REUS W/ TWL LRG LVL3 (GOWN DISPOSABLE) ×1 IMPLANT
GOWN STRL REUS W/ TWL XL LVL3 (GOWN DISPOSABLE) ×1 IMPLANT
GOWN STRL REUS W/TWL LRG LVL3 (GOWN DISPOSABLE) ×2
GOWN STRL REUS W/TWL XL LVL3 (GOWN DISPOSABLE) ×2
KIT BASIN OR (CUSTOM PROCEDURE TRAY) ×2 IMPLANT
KIT TURNOVER KIT B (KITS) ×2 IMPLANT
MESH VENTRALEX ST 1-7/10 CRC S (Mesh General) ×1 IMPLANT
NEEDLE 22X1 1/2 (OR ONLY) (NEEDLE) ×2 IMPLANT
NS IRRIG 1000ML POUR BTL (IV SOLUTION) ×2 IMPLANT
PACK GENERAL/GYN (CUSTOM PROCEDURE TRAY) ×2 IMPLANT
PAD ARMBOARD 7.5X6 YLW CONV (MISCELLANEOUS) ×2 IMPLANT
SUT MNCRL AB 4-0 PS2 18 (SUTURE) ×2 IMPLANT
SUT PROLENE 0 CT 1 30 (SUTURE) ×4 IMPLANT
SUT VIC AB 2-0 CT1 27 (SUTURE) ×2
SUT VIC AB 2-0 CT1 TAPERPNT 27 (SUTURE) ×1 IMPLANT
SUT VIC AB 3-0 SH 27 (SUTURE) ×2
SUT VIC AB 3-0 SH 27XBRD (SUTURE) ×1 IMPLANT
SYR CONTROL 10ML LL (SYRINGE) ×2 IMPLANT
TOWEL GREEN STERILE (TOWEL DISPOSABLE) ×2 IMPLANT
TOWEL GREEN STERILE FF (TOWEL DISPOSABLE) ×2 IMPLANT

## 2021-08-03 NOTE — Transfer of Care (Signed)
Immediate Anesthesia Transfer of Care Note  Patient: Bradley Morgan  Procedure(s) Performed: SUPRA-UMBILICAL HERNIA REPAIR (Abdomen) INSERTION OF MESH (Abdomen)  Patient Location: PACU  Anesthesia Type:General  Level of Consciousness: awake and drowsy  Airway & Oxygen Therapy: Patient Spontanous Breathing  Post-op Assessment: Report given to RN and Post -op Vital signs reviewed and stable  Post vital signs: Reviewed and stable  Last Vitals:  Vitals Value Taken Time  BP 158/83 08/03/21 0939  Temp    Pulse 72 08/03/21 0941  Resp 23 08/03/21 0941  SpO2 96 % 08/03/21 0941  Vitals shown include unvalidated device data.  Last Pain:  Vitals:   08/03/21 0834  TempSrc:   PainSc: 7       Patients Stated Pain Goal: 4 (06/34/94 9447)  Complications: No notable events documented.

## 2021-08-03 NOTE — Op Note (Signed)
  08/03/2021  9:36 AM  PATIENT:  Bradley Morgan  70 y.o. male  PRE-OPERATIVE DIAGNOSIS:  SUPRAUMBILICAL HERNIA  POST-OPERATIVE DIAGNOSIS:  SUPRAUMBILICAL HERNIA  PROCEDURE:  Procedure(s): SUPRA-UMBILICAL HERNIA REPAIR INSERTION OF MESH 4.3CM VENTRALEX  SURGEON:  Surgeon(s): Georganna Skeans, MD  ASSISTANTS: none   ANESTHESIA:   local and general  EBL:  Total I/O In: 400 [I.V.:400] Out: 10 [Blood:10]  BLOOD ADMINISTERED:none  DRAINS: none   SPECIMEN:  No Specimen  DISPOSITION OF SPECIMEN:  N/A  COUNTS:  YES  DICTATION: .Dragon Dictation Procedure detail: Informed consent was obtained.  He was identified in the prep holding area.  He received intravenous antibiotics.  He was brought to the operating room and general endotracheal anesthesia was administered by the anesthesia staff.  His abdomen was prepped and draped in a sterile fashion.  The supraumbilical region was infiltrated with local.  A vertical supraumbilical incision was made.  Subcutaneous tissues were dissected down revealing the hernia sac.  This was circumferentially dissected away from the surrounding fascia and reduced completely into the abdomen.  It contained only omentum.  The fascia was cleared circumferentially.  I then completed the repair with a 4.3 cm Ventralex mesh.  This was placed in a in ay fashion.  The superior and inferior straps were secured with multiple interrupted 0 Prolene.  I then placed an additional 0 Prolene to close the fascia over the top of the mesh.  The area was copiously irrigated.  Some additional local was injected.  Hemostasis was obtained.  Subcutaneous tissues were approximated with interrupted 2-0 Vicryl and the skin was closed with running 4-0 Monocryl subcuticular followed by Dermabond.  All counts were correct.  He tolerated the procedure well without apparent complication and was taken recovery in stable condition. PATIENT DISPOSITION:  PACU - hemodynamically stable.   Delay  start of Pharmacological VTE agent (>24hrs) due to surgical blood loss or risk of bleeding:  no  Georganna Skeans, MD, MPH, FACS Pager: [phone]  11/14/20229:36 AM

## 2021-08-03 NOTE — Anesthesia Postprocedure Evaluation (Signed)
Anesthesia Post Note  Patient: Bradley Morgan  Procedure(s) Performed: SUPRA-UMBILICAL HERNIA REPAIR (Abdomen) INSERTION OF MESH (Abdomen)     Patient location during evaluation: PACU Anesthesia Type: General Level of consciousness: awake Pain management: pain level controlled Vital Signs Assessment: post-procedure vital signs reviewed and stable Respiratory status: spontaneous breathing Cardiovascular status: stable Postop Assessment: no apparent nausea or vomiting Anesthetic complications: no   No notable events documented.  Last Vitals:  Vitals:   08/03/21 1030 08/03/21 1040  BP:  136/73  Pulse: 72 69  Resp: 20 10  Temp:  36.7 C  SpO2: 92% 98%    Last Pain:  Vitals:   08/03/21 1040  TempSrc:   PainSc: 4                  [name]

## 2021-08-03 NOTE — Progress Notes (Signed)
Noted to have red area below incision line reported by OR staff area there prior to surgery and no change to area when received to PACU

## 2021-08-03 NOTE — H&P (Signed)
Bradley Morgan is an 70 y.o. male.   Chief Complaint: Supraumbilical hernia HPI: Presents for repair of supraumbilical hernia with mesh.  His symptoms have been about the same since I saw him in the office.  Past Medical History:  Diagnosis Date   Adrenal adenoma, left    Arthritis    Bursitis of elbow    Complication of anesthesia    ANESTHESIA DID NOT TAKE EFFECT WITH BACK SURGERY "I FELT THEM CUT"   Congenital absence of right kidney    Coronary artery disease    Diverticulosis of colon    Family history of anesthesia complication    "hard time waking up my dad" many yrs ago   Frequency of urination    Full dentures    GAD (generalized anxiety disorder)    GERD (gastroesophageal reflux disease)    History of acute renal failure    12-16-2013;  09-24-2017 due to stone obstruction   History of duodenal ulcer 04/2000   w/ gi bleed post egd w/ cauterization (caused by chronic nsaid use)   History of encephalopathy 04/26/2014   ACUTE EPISODE SECONDARY TO DRUG OVERDOSE (NARC, BZD, FLEXERIL)   History of GI bleed    08/ 2001   duodenal ulcer bleed post egd w/ cauterization   History of kidney stones    History of prostatitis    Hyperlipidemia    Hypertension    Left bundle branch block 05/05/2021   Left ureteral stone    Nocturia more than twice per night    Peripheral neuropathy    bilateral leg numbness due to back issues, walks w/ cane   Pneumonia    PONV (postoperative nausea and vomiting)    Restless leg syndrome    Solitary left kidney    Wears glasses     Past Surgical History:  Procedure Laterality Date   ANTERIOR CERVICAL DECOMP/DISCECTOMY FUSION  05-21-2009   dr Maia Plan [facility]   C5-6, C6-7 and aviator plate   CARDIOVASCULAR STRESS TEST  12/17/2013   FIXED DEFECT IN THE INFEROSEPTAL REGION BUT NO STRESS INDUCED ISCHEMIA/  NORMAL LV FUNCTION AND WALL MOTION , EF 63%   CLOSED MANIPULATION POST TOTAL KNEE ARTHROPLASTY Left 11-28-2001    dr Wynelle Link   CYSTOSCOPY WITH  RETROGRADE PYELOGRAM, URETEROSCOPY AND STENT PLACEMENT Left 10/07/2017   Procedure: CYSTOSCOPY WITH LEFT RETROGRADE PYELOGRAM, BASKETRY OF STONE, URETEROSCOPY AND STENT REMOVAL;  Surgeon: Alexis Frock, MD;  Location: [place];  Service: Urology;  Laterality: Left;   CYSTOSCOPY WITH STENT PLACEMENT Left 09/24/2017   Procedure: RENAL CYSTOSCOPY LEFT RETROGRADE, LEFT URETERAL STENT PLACEMENT;  Surgeon: Alexis Frock, MD;  Location: [place];  Service: Urology;  Laterality: Left;   HIP ARTHROSCOPY W/ LABRAL DEBRIDEMENT Left 08/12/2006   dr Wynelle Link [facility]   and chondraplasty   INCISION AND DRAINAGE ABSCESS Right 03/06/2015   Procedure: INCISION AND DRAINAGE ABSCESS RIGHT ARM;  Surgeon: Daryll Brod, MD;  Location: [place];  Service: Orthopedics;  Laterality: Right;   KNEE ARTHROSCOPY Left x2  2002   LUMBAR SPINE SURGERY  x5  last one 2002   SHOULDER ARTHROSCOPY WITH SUBACROMIAL DECOMPRESSION, ROTATOR CUFF REPAIR AND BICEP TENDON REPAIR Right 06-27-2002  dr Wynelle Link [facility]   labral debridement and DCR   SHOULDER OPEN ROTATOR CUFF REPAIR Left 07/11/2014   Procedure: ROTATOR CUFF REPAIR SHOULDER OPEN WITH  GRAFT ;  Surgeon: Tobi Bastos, MD;  Location: [facility];  Service: Orthopedics;  Laterality: Left;  TOTAL HIP ARTHROPLASTY Left 10/02/2018   Procedure: TOTAL HIP ARTHROPLASTY ANTERIOR APPROACH;  Surgeon: Gaynelle Arabian, MD;  Location: [facility];  Service: Orthopedics;  Laterality: Left;  14min   TOTAL HIP ARTHROPLASTY Right 07/30/2019   Procedure: TOTAL HIP ARTHROPLASTY ANTERIOR APPROACH;  Surgeon: Gaynelle Arabian, MD;  Location: [facility];  Service: Orthopedics;  Laterality: Right;  132min   TOTAL KNEE ARTHROPLASTY Left 10-09-2001   dr Wynelle Link [facility]   TOTAL KNEE REVISION Left 08/03/2017   Procedure: Left knee tibia polyethylene and patella revision;  Surgeon: Gaynelle Arabian, MD;  Location: [facility];  Service: Orthopedics;  Laterality: Left;  with abductor block    Family History   Problem Relation Age of Onset   Heart failure Father    Diabetes Father    Hypertension Father    Anxiety disorder Mother        also tended to overdose on anxiety medication   Social History:  reports that he quit smoking about 22 years ago. His smoking use included cigarettes. He has never used smokeless tobacco. He reports that he does not drink alcohol and does not use drugs.  Allergies:  Allergies  Allergen Reactions   Aspirin Other (See Comments)    Stomach ulcers. No high doses   Nsaids Other (See Comments)    CAUSES STOMACH ULCERS     Medications Prior to Admission  Medication Sig Dispense Refill   aspirin EC 81 MG tablet Take 81 mg by mouth daily. Swallow whole.     Carboxymethylcellul-Glycerin (LUBRICATING EYE DROPS OP) Place 1 drop into both eyes daily as needed (dry eyes).     carvedilol (COREG) 3.125 MG tablet Take 3.125 mg by mouth 2 (two) times daily with a meal.      clonazePAM (KLONOPIN) 0.5 MG tablet Take 0.5 mg by mouth 3 (three) times daily as needed for anxiety.     cyclobenzaprine (FLEXERIL) 10 MG tablet Take 1 tablet (10 mg total) 3 (three) times daily as needed by mouth for muscle spasms. 90 tablet 0   furosemide (LASIX) 20 MG tablet Take 20 mg by mouth daily as needed (fluid retention).     gabapentin (NEURONTIN) 300 MG capsule Take 300 mg by mouth 2 (two) times daily.     HYDROcodone-acetaminophen (NORCO) 10-325 MG tablet Take 1 tablet by mouth every 4 (four) hours as needed for severe pain.     meloxicam (MOBIC) 15 MG tablet Take 15 mg by mouth daily as needed for pain.     pantoprazole (PROTONIX) 40 MG tablet Take 40 mg by mouth daily with breakfast.     rOPINIRole (REQUIP) 1 MG tablet Take 1 mg by mouth See admin instructions. Take 1 mg by mouth at bedtime as needed for restless legs. May take additional 1 mg if needed.     simvastatin (ZOCOR) 40 MG tablet Take 40 mg by mouth daily.     triamcinolone cream (KENALOG) 0.1 % Apply 1 application topically  daily as needed (rash).      No results found for this or any previous visit (from the past 48 hour(s)). No results found.  Review of Systems  Blood pressure (!) 161/91, pulse 75, temperature 99 F (37.2 C), temperature source Oral, resp. rate 18, height 6\' 1"  (1.854 m), weight 102.5 kg, SpO2 96 %. Physical Exam HENT:     Head: Normocephalic.     Nose: Nose normal.     Mouth/Throat:     Mouth: Mucous membranes are moist.  Eyes:  Pupils: Pupils are equal, round, and reactive to light.  Cardiovascular:     Rate and Rhythm: Normal rate and regular rhythm.     Pulses: Normal pulses.     Heart sounds: Normal heart sounds.  Pulmonary:     Effort: Pulmonary effort is normal.     Breath sounds: Normal breath sounds.  Abdominal:     General: Abdomen is flat.     Palpations: Abdomen is soft.     Tenderness: There is no guarding or rebound.     Hernia: A hernia is present.     Comments: Supraumbilical hernia with mild tenderness  Musculoskeletal:        General: Normal range of motion.     Cervical back: Normal range of motion and neck supple.  Skin:    General: Skin is warm.     Capillary Refill: Capillary refill takes 2 to 3 seconds.  Neurological:     Mental Status: He is alert and oriented to person, place, and time.  Psychiatric:        Mood and Affect: Mood normal.     Assessment/Plan Supraumbilical hernia -for repair of supraumbilical hernia with mesh.  Procedure, risks, benefits, and expected postoperative course were again discussed with him and he is agreeable.  Zenovia Jarred, MD 08/03/2021, 8:25 AM

## 2021-08-03 NOTE — Anesthesia Procedure Notes (Signed)
Procedure Name: Intubation Date/Time: 08/03/2021 8:58 AM Performed by: Ardyth Harps, CRNA Pre-anesthesia Checklist: Patient identified, Emergency Drugs available, Suction available and Patient being monitored Patient Re-evaluated:Patient Re-evaluated prior to induction Oxygen Delivery Method: Circle System Utilized Preoxygenation: Pre-oxygenation with 100% oxygen Induction Type: IV induction Ventilation: Mask ventilation without difficulty and Oral airway inserted - appropriate to patient size Laryngoscope Size: Mac and 4 Grade View: Grade I Tube type: Oral Number of attempts: 1 Airway Equipment and Method: Stylet and Oral airway Placement Confirmation: ETT inserted through vocal cords under direct vision, positive ETCO2 and breath sounds checked- equal and bilateral Secured at: 23 cm Tube secured with: Tape Dental Injury: Teeth and Oropharynx as per pre-operative assessment

## 2021-08-04 ENCOUNTER — Encounter (HOSPITAL_COMMUNITY): Payer: Self-pay | Admitting: General Surgery

## 2021-09-15 NOTE — Progress Notes (Signed)
Cardiology Office Note:    Date:  09/23/2021   ID:  Bradley Morgan, DOB 1950-10-14, MRN [id number]  PCP:  Christain Sacramento, MD  Cardiologist:  Candee Furbish, MD  Electrophysiologist:  None   Referring MD: Christain Sacramento, MD   Chief Complaint: follow-up of CHF and hypertension  History of Present Illness:    Bradley Morgan is a 70 y.o. male with a history of chronic CHF with mildly reduced EF of 44% on Echo in 05/2021, hypertension, hyperlipidemia, GERD, adrenal adenoma, anxiety, and remote tobacco use (quit smoking in early 2000s) who is followed by Dr. Marlou Porch and presents today for follow-up of CHF and hypertension.   Patient was seen by Dr. Marlou Porch in 11/2013 during an admission for sudden onset of chest pain. Chest pain only lasted for a few minutes but was associated with nausea and diaphoresis. Troponin was normal and EKG unremarkable. Given multiple CV risk factors (hypertension, hyperlipidemia, former tobacco use, and family history), Lexiscan Myoview was ordered and showed a fixed defect in the inferoseptal region but no ischemia. EF 63%. He was not felt to need any follow-up with Cardiology after discharge.   He was not seen again by Cardiology until 05/2021 when he was seen by me for pre-op evaluation for upcoming hernia repair. EKG a pre-anesthesia visit showed new LBBB. Patient reported some dyspnea on exertion with activities such as walking up the stairs and doing basic household chores but denied any chest pain. Echo and Lexiscan Myoview were ordered for further risk stratification. Echo showed LVEF of 44% with global hypokinesis, grade 1 diastolic dysfunction, and mild to moderate AI. Myoview was intermediate risk with reduced perfusion in the mid-apical inferior myocardium that is slightly worse at stress than rest consistent with infarct with peri-infarct ischemia. However, this was also noted on Myoview in 2015. Wall motion abnormality was more consistent with LBBB than infarct.  Therefore, it was suspected that this was related to diaphragmatic attenuation rather than true infarct with peri-infarct ischemia. Reviewed results with Dr. Marlou Porch who felt patient was OK to proceed with surgery.   Patient presents today for follow-up. Here with his wife. He did have his hernia repair but still notes some abdominal pain.  He is doing well from a cardiac standpoint though.  He denies any chest pain or shortness of breath. He describes stable orthopnea and states he sleeps on multiple pillows on his side but this is not new.  No PND.  He does have chronic lower extremity edema which is also stable.  Of note, his left leg is significantly larger than his right but he states this is due to multiple surgeries on his left leg and again this is not new (has been this way for years).  Prior lower extremity Doppler in 2019 was negative for DVT.  No palpitations, lightheadedness, dizziness, syncope.  Past Medical History:  Diagnosis Date   Adrenal adenoma, left    Arthritis    Bursitis of elbow    Complication of anesthesia    ANESTHESIA DID NOT TAKE EFFECT WITH BACK SURGERY "I FELT THEM CUT"   Congenital absence of right kidney    Coronary artery disease    Diverticulosis of colon    Family history of anesthesia complication    "hard time waking up my dad" many yrs ago   Frequency of urination    Full dentures    GAD (generalized anxiety disorder)    GERD (gastroesophageal reflux disease)    History  of acute renal failure    12-16-2013;  09-24-2017 due to stone obstruction   History of duodenal ulcer 04/2000   w/ gi bleed post egd w/ cauterization (caused by chronic nsaid use)   History of encephalopathy 04/26/2014   ACUTE EPISODE SECONDARY TO DRUG OVERDOSE (NARC, BZD, FLEXERIL)   History of GI bleed    08/ 2001   duodenal ulcer bleed post egd w/ cauterization   History of kidney stones    History of prostatitis    Hyperlipidemia    Hypertension    Left bundle branch block  05/05/2021   Left ureteral stone    Nocturia more than twice per night    Peripheral neuropathy    bilateral leg numbness due to back issues, walks w/ cane   Pneumonia    PONV (postoperative nausea and vomiting)    Restless leg syndrome    Solitary left kidney    Wears glasses     Past Surgical History:  Procedure Laterality Date   ANTERIOR CERVICAL DECOMP/DISCECTOMY FUSION  05-21-2009   dr Maia Plan [facility]   C5-6, C6-7 and aviator plate   CARDIOVASCULAR STRESS TEST  12/17/2013   FIXED DEFECT IN THE INFEROSEPTAL REGION BUT NO STRESS INDUCED ISCHEMIA/  NORMAL LV FUNCTION AND WALL MOTION , EF 63%   CLOSED MANIPULATION POST TOTAL KNEE ARTHROPLASTY Left 11-28-2001    dr Wynelle Link   CYSTOSCOPY WITH RETROGRADE PYELOGRAM, URETEROSCOPY AND STENT PLACEMENT Left 10/07/2017   Procedure: CYSTOSCOPY WITH LEFT RETROGRADE PYELOGRAM, BASKETRY OF STONE, URETEROSCOPY AND STENT REMOVAL;  Surgeon: Alexis Frock, MD;  Location: [place];  Service: Urology;  Laterality: Left;   CYSTOSCOPY WITH STENT PLACEMENT Left 09/24/2017   Procedure: RENAL CYSTOSCOPY LEFT RETROGRADE, LEFT URETERAL STENT PLACEMENT;  Surgeon: Alexis Frock, MD;  Location: [place];  Service: Urology;  Laterality: Left;   HIP ARTHROSCOPY W/ LABRAL DEBRIDEMENT Left 08/12/2006   dr Wynelle Link [facility]   and chondraplasty   INCISION AND DRAINAGE ABSCESS Right 03/06/2015   Procedure: INCISION AND DRAINAGE ABSCESS RIGHT ARM;  Surgeon: Daryll Brod, MD;  Location: [place];  Service: Orthopedics;  Laterality: Right;   INSERTION OF MESH N/A 08/03/2021   Procedure: INSERTION OF MESH;  Surgeon: Georganna Skeans, MD;  Location: [place];  Service: General;  Laterality: N/A;   KNEE ARTHROSCOPY Left x2  2002   LUMBAR SPINE SURGERY  x5  last one 2002   SHOULDER ARTHROSCOPY WITH SUBACROMIAL DECOMPRESSION, ROTATOR CUFF REPAIR AND BICEP TENDON REPAIR Right 06-27-2002  dr Wynelle Link [facility]   labral debridement and DCR   SHOULDER OPEN ROTATOR CUFF  REPAIR Left 07/11/2014   Procedure: ROTATOR CUFF REPAIR SHOULDER OPEN WITH  GRAFT ;  Surgeon: Tobi Bastos, MD;  Location: [facility];  Service: Orthopedics;  Laterality: Left;   SUPRA-UMBILICAL HERNIA N/A 16/06/9603   Procedure: SUPRA-UMBILICAL HERNIA REPAIR;  Surgeon: Georganna Skeans, MD;  Location: [place];  Service: General;  Laterality: N/A;   TOTAL HIP ARTHROPLASTY Left 10/02/2018   Procedure: TOTAL HIP ARTHROPLASTY ANTERIOR APPROACH;  Surgeon: Gaynelle Arabian, MD;  Location: [facility];  Service: Orthopedics;  Laterality: Left;  121min   TOTAL HIP ARTHROPLASTY Right 07/30/2019   Procedure: TOTAL HIP ARTHROPLASTY ANTERIOR APPROACH;  Surgeon: Gaynelle Arabian, MD;  Location: [facility];  Service: Orthopedics;  Laterality: Right;  154min   TOTAL KNEE ARTHROPLASTY Left 10-09-2001   dr Wynelle Link [facility]   TOTAL KNEE REVISION Left 08/03/2017   Procedure: Left knee tibia polyethylene and patella revision;  Surgeon: Gaynelle Arabian, MD;  Location: [facility];  Service: Orthopedics;  Laterality: Left;  with abductor block    Current Medications: Current Meds  Medication Sig   aspirin EC 81 MG tablet Take 81 mg by mouth daily. Swallow whole.   Carboxymethylcellul-Glycerin (LUBRICATING EYE DROPS OP) Place 1 drop into both eyes daily as needed (dry eyes).   carvedilol (COREG) 3.125 MG tablet Take 3.125 mg by mouth 2 (two) times daily with a meal.    clonazePAM (KLONOPIN) 0.5 MG tablet Take 0.5 mg by mouth 3 (three) times daily as needed for anxiety.   cyclobenzaprine (FLEXERIL) 10 MG tablet Take 1 tablet (10 mg total) 3 (three) times daily as needed by mouth for muscle spasms.   furosemide (LASIX) 20 MG tablet Take 20 mg by mouth daily as needed (fluid retention).   gabapentin (NEURONTIN) 300 MG capsule Take 300 mg by mouth 2 (two) times daily.   losartan (COZAAR) 25 MG tablet Take 1 tablet (25 mg total) by mouth daily.   meloxicam (MOBIC) 15 MG tablet Take 15 mg by mouth daily as needed for pain.   oxyCODONE (OXY  IR/ROXICODONE) 5 MG immediate release tablet Take 1 tablet (5 mg total) by mouth every 6 (six) hours as needed for severe pain.   pantoprazole (PROTONIX) 40 MG tablet Take 40 mg by mouth daily with breakfast.   rOPINIRole (REQUIP) 1 MG tablet Take 1 mg by mouth See admin instructions. Take 1 mg by mouth at bedtime as needed for restless legs. May take additional 1 mg if needed. PATIENT TAKES DAILY   simvastatin (ZOCOR) 40 MG tablet Take 40 mg by mouth daily.     Allergies:   Aspirin and Nsaids   Social History   Socioeconomic History   Marital status: Married    Spouse name: Not on file   Number of children: Not on file   Years of education: Not on file   Highest education level: Not on file  Occupational History   Not on file  Tobacco Use   Smoking status: Former    Years: 15.00    Types: Cigarettes    Quit date: 10/09/1998    Years since quitting: 22.9   Smokeless tobacco: Never  Vaping Use   Vaping Use: Never used  Substance and Sexual Activity   Alcohol use: No   Drug use: No   Sexual activity: Not on file  Other Topics Concern   Not on file  Social History Narrative   Not on file   Social Determinants of Health   Financial Resource Strain: Not on file  Food Insecurity: Not on file  Transportation Needs: Not on file  Physical Activity: Not on file  Stress: Not on file  Social Connections: Not on file     Family History: The patient's family history includes Anxiety disorder in his mother; Diabetes in his father; Heart failure in his father; Hypertension in his father.  ROS:   Please see the history of present illness.     EKGs/Labs/Other Studies Reviewed:    The following studies were reviewed today:  Echocardiogram 06/18/2021: Impressions:  1. Left ventricular ejection fraction by 3D volume is 44 %. The left  ventricle has mild to moderately decreased function. The left ventricle  demonstrates global hypokinesis. Left ventricular diastolic parameters are   consistent with Grade I diastolic  dysfunction (impaired relaxation).   2. Right ventricular systolic function is normal. The right ventricular  size is normal.   3. The  mitral valve is normal in structure. No evidence of mitral valve  regurgitation. No evidence of mitral stenosis.   4. The aortic valve is normal in structure. Aortic valve regurgitation is  mild to moderate. Mild aortic valve sclerosis is present, with no evidence  of aortic valve stenosis.   5. The inferior vena cava is normal in size with greater than 50%  respiratory variability, suggesting right atrial pressure of 3 mmHg.  _______________  Myoview 06/19/2021:   Findings are equivocal. The study is intermediate risk.   No ST deviation was noted.   LV perfusion is equivocal. There is evidence of ischemia. 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   Left ventricular function is abnormal. There were multiple regional abnormalities. Nuclear stress EF: 39 %. The left ventricular ejection fraction is moderately decreased (30-44%). End systolic cavity size is normal.   Prior study available for comparison from 12/17/2013. No changes compared to prior study.   There is reduced perfusion in the mid-apical inferior myocardium that is slightly worse at stress than rest consistent with infarct with peri-infarct ischemia.  However, this was also noted on stress imaging in 2015.  Wall motion abnormality is more consistent with LBBB than infarct.  Suspect this is related to diaphragmatic attenuation rather than infarct with peri-infarct ischemia.  If clinical suspicion is high, consider coronary CT-A or invasive angiography.  EKG:  EKG not ordered today.   Recent Labs: 07/29/2021: BUN 17; Creatinine, Ser 1.39; Hemoglobin 12.3; Platelets 147; Potassium 4.1; Sodium 140  Recent Lipid Panel    Component Value Date/Time   CHOL 194 12/16/2013 2034   TRIG 164 (H) 12/16/2013 2034   HDL 39 (L) 12/16/2013 2034   CHOLHDL 5.0 12/16/2013 2034   VLDL  33 12/16/2013 2034   LDLCALC 122 (H) 12/16/2013 2034    Physical Exam:    Vital Signs: BP (!) 144/78    Pulse 76    Ht 6\' 1"  (1.854 m)    Wt 228 lb 9.6 oz (103.7 kg)    SpO2 97%    BMI 30.16 kg/m     Wt Readings from Last 3 Encounters:  09/23/21 228 lb 9.6 oz (103.7 kg)  08/03/21 226 lb (102.5 kg)  07/29/21 228 lb 9.6 oz (103.7 kg)     General: 70 y.o. Caucasian male in no acute distress. HEENT: Normocephalic and atraumatic. Sclera clear. Neck: Supple. No carotid bruits. No JVD. Heart: RRR. Distinct S1 and S2. No murmurs, gallops, or rubs. Radial pulses 2+ and equal bilaterally. Lungs: No increased work of breathing. Clear to ausculation bilaterally. No wheezes, rhonchi, or rales.  Abdomen: Soft, non-distended, and non-tender to palpation.  Extremities: No lower extremity edema.    Skin: Warm and dry. Neuro: Alert and oriented x3. No focal deficits. Psych: Normal affect. Responds appropriately.   Assessment:    1. HFrEF (heart failure with reduced ejection fraction) ([place])   2. Chronic lower extremity edema   3. Primary hypertension   4. Hyperlipidemia, unspecified hyperlipidemia type   5. Snoring   6. Medication management     Plan:    Chronic CHF with Mildly Reduced EF Chronic Lower Extremity Edema Echo in 05/2021 showed LVEF of 44% with global hypokinesis, grade 1 diastolic dysfunction, and mild to moderate AI. Myoview at that time was intermediate risk with reduced perfusion in inferior myocardium that was felt to be more consistent with diaphragmatic attenuation that infarct with peri-infarct ischemia. 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 - Patient has chronic lower extremity  edema on exam but otherwise euvolemic. No other symptoms of CHF. - Continue Lasix 20mg  daily as needed for edema. - Continue Coreg 3.125mg  twice daily.  - Will start Losartan 65m daily. - Discussed sodium restrictions and compression stockings for lower extremity edema. - Consider  repeat Echo in a few months after optimizing GDMT.   Hypertension BP elevated in the office. Initially 164/98 and then 144/78 on my recheck. - Continue Coreg 3.125mg  twice daily.  - Will start Losartan 25mg  daily. - Advised patient to keep BP/HR log for 2 weeks and then send this to [place]. If BP still above goal, may need to increase Coreg or Losartan. - Of note, patient has a solitary kidney so we will monitor renal function closely after adding ARB. Baseline creatinine looks to be around 0.9 to 1.2. Will repeat CMET in 1 week.   Hyperlipidemia Last lipid panel in 05/2020: Total Cholesterol 165, Triglycerides 177, HDL 47, LDL 91.  - Continue Simvastatin 40mg  daily.  - Patient is not fasting today, will repeat lipid panel and CMET in 1 week.  Snoring - Wife reports snoring and described apneic episodes at prior visit. - BMI 29.  - Suspect patient has sleep apnea. Offered sleep study at last visit but patient declined. Wife also does not think he would be compliant with CPAP. Patient continues to decline sleep study.  Disposition: Follow up in 4-6 months.   Medication Adjustments/Labs and Tests Ordered: Current medicines are reviewed at length with the patient today.  Concerns regarding medicines are outlined above.  Orders Placed This Encounter  Procedures   Lipid panel   Comprehensive metabolic panel   Meds ordered this encounter  Medications   losartan (COZAAR) 25 MG tablet    Sig: Take 1 tablet (25 mg total) by mouth daily.    Dispense:  90 tablet    Refill:  3    Patient Instructions  Medication Instructions:  START Losartan 25 mg daily  *If you need a refill on your cardiac medications before your next appointment, please call your pharmacy*  Lab Work: Your physician recommends that you return for lab work in 1 week :  CMET Fasting Lipid Panel-DO NOT EAT OR DRINK PAST MIDNIGHT. OKAY TO HAVE WATER. If you have labs (blood work) drawn today and your tests are completely  normal, you will receive your results only by: [place] (if you have MyChart) OR A paper copy in the mail If you have any lab test that is abnormal or we need to change your treatment, we will call you to review the results.  Testing/Procedures: NONE ordered at this time of appointment   Follow-Up: At [facility], you and your health needs are our priority.  As part of our continuing mission to provide you with exceptional heart care, we have created designated Provider Care Teams.  These Care Teams include your primary Cardiologist (physician) and Advanced Practice Providers (APPs -  Physician Assistants and Nurse Practitioners) who all work together to provide you with the care you need, when you need it.  Your next appointment:   4-6 month(s)  The format for your next appointment:   In Person  Provider:   Candee Furbish, MD    Other Instructions Monitor blood pressure and heart rate for 2 weeks and give our office a call or send a MyChart me    Signed, Darreld Mclean, PA-C  09/23/2021 11:24 AM    [place]

## 2021-09-23 ENCOUNTER — Ambulatory Visit (INDEPENDENT_AMBULATORY_CARE_PROVIDER_SITE_OTHER): Payer: Medicare HMO | Admitting: Student

## 2021-09-23 ENCOUNTER — Encounter: Payer: Self-pay | Admitting: Student

## 2021-09-23 ENCOUNTER — Other Ambulatory Visit: Payer: Self-pay

## 2021-09-23 VITALS — BP 144/78 | HR 76 | Ht 73.0 in | Wt 228.6 lb

## 2021-09-23 DIAGNOSIS — E785 Hyperlipidemia, unspecified: Secondary | ICD-10-CM

## 2021-09-23 DIAGNOSIS — I5020 Unspecified systolic (congestive) heart failure: Secondary | ICD-10-CM

## 2021-09-23 DIAGNOSIS — R600 Localized edema: Secondary | ICD-10-CM | POA: Diagnosis not present

## 2021-09-23 DIAGNOSIS — R0683 Snoring: Secondary | ICD-10-CM

## 2021-09-23 DIAGNOSIS — Z79899 Other long term (current) drug therapy: Secondary | ICD-10-CM

## 2021-09-23 DIAGNOSIS — I10 Essential (primary) hypertension: Secondary | ICD-10-CM

## 2021-09-23 MED ORDER — LOSARTAN POTASSIUM 25 MG PO TABS: 25.0000 mg | ORAL_TABLET | Freq: Every day | ORAL | 3 refills | Status: DC

## 2021-09-23 NOTE — Patient Instructions (Signed)
Medication Instructions:  START Losartan 25 mg daily  *If you need a refill on your cardiac medications before your next appointment, please call your pharmacy*  Lab Work: Your physician recommends that you return for lab work in 1 week :  CMET Fasting Lipid Panel-DO NOT EAT OR DRINK PAST MIDNIGHT. OKAY TO HAVE WATER. If you have labs (blood work) drawn today and your tests are completely normal, you will receive your results only by: [place] (if you have MyChart) OR A paper copy in the mail If you have any lab test that is abnormal or we need to change your treatment, we will call you to review the results.  Testing/Procedures: NONE ordered at this time of appointment   Follow-Up: At [facility], you and your health needs are our priority.  As part of our continuing mission to provide you with exceptional heart care, we have created designated Provider Care Teams.  These Care Teams include your primary Cardiologist (physician) and Advanced Practice Providers (APPs -  Physician Assistants and Nurse Practitioners) who all work together to provide you with the care you need, when you need it.  Your next appointment:   4-6 month(s)  The format for your next appointment:   In Person  Provider:   Candee Furbish, MD    Other Instructions Monitor blood pressure and heart rate for 2 weeks and give our office a call or send a MyChart me

## 2021-10-03 LAB — COMPREHENSIVE METABOLIC PANEL
ALT: 7 IU/L (ref 0–44)
AST: 10 IU/L (ref 0–40)
Albumin/Globulin Ratio: 2.4 — ABNORMAL HIGH (ref 1.2–2.2)
Albumin: 4.4 g/dL (ref 3.8–4.8)
Alkaline Phosphatase: 70 IU/L (ref 44–121)
BUN/Creatinine Ratio: 12 (ref 10–24)
BUN: 17 mg/dL (ref 8–27)
Bilirubin Total: <0.2 mg/dL (ref 0.0–1.2)
CO2: 26 mmol/L (ref 20–29)
Calcium: 9.1 mg/dL (ref 8.6–10.2)
Chloride: 103 mmol/L (ref 96–106)
Creatinine, Ser: 1.47 mg/dL — ABNORMAL HIGH (ref 0.76–1.27)
Globulin, Total: 1.8 g/dL (ref 1.5–4.5)
Glucose: 87 mg/dL (ref 70–99)
Potassium: 4.8 mmol/L (ref 3.5–5.2)
Sodium: 142 mmol/L (ref 134–144)
Total Protein: 6.2 g/dL (ref 6.0–8.5)
eGFR: 52 mL/min/{1.73_m2} — ABNORMAL LOW (ref >59)

## 2021-10-03 LAB — LIPID PANEL
Chol/HDL Ratio: 3.7 ratio (ref 0.0–5.0)
Cholesterol, Total: 169 mg/dL (ref 100–199)
HDL: 46 mg/dL (ref >39)
LDL Chol Calc (NIH): 103 mg/dL — ABNORMAL HIGH (ref 0–99)
Triglycerides: 111 mg/dL (ref 0–149)
VLDL Cholesterol Cal: 20 mg/dL (ref 5–40)

## 2021-10-07 ENCOUNTER — Other Ambulatory Visit: Payer: Self-pay

## 2021-10-07 DIAGNOSIS — I10 Essential (primary) hypertension: Secondary | ICD-10-CM

## 2021-10-07 DIAGNOSIS — Z79899 Other long term (current) drug therapy: Secondary | ICD-10-CM

## 2021-10-07 MED ORDER — ROSUVASTATIN CALCIUM 20 MG PO TABS: 20.0000 mg | ORAL_TABLET | Freq: Every day | ORAL | 6 refills | Status: DC

## 2021-12-25 ENCOUNTER — Emergency Department (HOSPITAL_COMMUNITY)
Admission: EM | Admit: 2021-12-25 | Discharge: 2021-12-26 | Disposition: A | Discharge status: Home / Self Care | Payer: Medicare HMO | Attending: Emergency Medicine | Admitting: Emergency Medicine

## 2021-12-25 ENCOUNTER — Other Ambulatory Visit: Payer: Self-pay

## 2021-12-25 DIAGNOSIS — K4030 Unilateral inguinal hernia, with obstruction, without gangrene, not specified as recurrent: Secondary | ICD-10-CM | POA: Diagnosis not present

## 2021-12-25 DIAGNOSIS — R1030 Lower abdominal pain, unspecified: Secondary | ICD-10-CM | POA: Diagnosis present

## 2021-12-25 DIAGNOSIS — Z79899 Other long term (current) drug therapy: Secondary | ICD-10-CM | POA: Insufficient documentation

## 2021-12-25 DIAGNOSIS — I10 Essential (primary) hypertension: Secondary | ICD-10-CM | POA: Diagnosis not present

## 2021-12-25 DIAGNOSIS — Z7982 Long term (current) use of aspirin: Secondary | ICD-10-CM | POA: Insufficient documentation

## 2021-12-25 LAB — CBC WITH DIFFERENTIAL/PLATELET
Abs Immature Granulocytes: 0.02 10*3/uL (ref 0.00–0.07)
Basophils Absolute: 0 10*3/uL (ref 0.0–0.1)
Basophils Relative: 1 %
Eosinophils Absolute: 0.1 10*3/uL (ref 0.0–0.5)
Eosinophils Relative: 2 %
HCT: 41.6 % (ref 39.0–52.0)
Hemoglobin: 13.3 g/dL (ref 13.0–17.0)
Immature Granulocytes: 0 %
Lymphocytes Relative: 25 %
Lymphs Abs: 1.4 10*3/uL (ref 0.7–4.0)
MCH: 31.4 pg (ref 26.0–34.0)
MCHC: 32 g/dL (ref 30.0–36.0)
MCV: 98.3 fL (ref 80.0–100.0)
Monocytes Absolute: 0.6 10*3/uL (ref 0.1–1.0)
Monocytes Relative: 10 %
Neutro Abs: 3.6 10*3/uL (ref 1.7–7.7)
Neutrophils Relative %: 62 %
Platelets: 144 10*3/uL — ABNORMAL LOW (ref 150–400)
RBC: 4.23 MIL/uL (ref 4.22–5.81)
RDW: 13.7 % (ref 11.5–15.5)
WBC: 5.8 10*3/uL (ref 4.0–10.5)
nRBC: 0 % (ref 0.0–0.2)

## 2021-12-25 LAB — URINALYSIS, ROUTINE W REFLEX MICROSCOPIC
Bacteria, UA: NONE SEEN
Bilirubin Urine: NEGATIVE
Glucose, UA: NEGATIVE mg/dL
Ketones, ur: NEGATIVE mg/dL
Leukocytes,Ua: NEGATIVE
Nitrite: NEGATIVE
Protein, ur: 100 mg/dL — AB
Specific Gravity, Urine: 1.027 (ref 1.005–1.030)
pH: 5 (ref 5.0–8.0)

## 2021-12-25 LAB — COMPREHENSIVE METABOLIC PANEL
ALT: 15 U/L (ref 0–44)
AST: 19 U/L (ref 15–41)
Albumin: 4.1 g/dL (ref 3.5–5.0)
Alkaline Phosphatase: 54 U/L (ref 38–126)
Anion gap: 8 (ref 5–15)
BUN: 18 mg/dL (ref 8–23)
CO2: 22 mmol/L (ref 22–32)
Calcium: 9.6 mg/dL (ref 8.9–10.3)
Chloride: 108 mmol/L (ref 98–111)
Creatinine, Ser: 1.66 mg/dL — ABNORMAL HIGH (ref 0.61–1.24)
GFR, Estimated: 45 mL/min — ABNORMAL LOW (ref >60)
Glucose, Bld: 106 mg/dL — ABNORMAL HIGH (ref 70–99)
Potassium: 3.9 mmol/L (ref 3.5–5.1)
Sodium: 138 mmol/L (ref 135–145)
Total Bilirubin: 0.4 mg/dL (ref 0.3–1.2)
Total Protein: 6.3 g/dL — ABNORMAL LOW (ref 6.5–8.1)

## 2021-12-25 NOTE — ED Triage Notes (Signed)
Pt reports having a sharp pain on his right groin, denies any urinary symptoms states he thinks is a hernia. ?

## 2021-12-26 ENCOUNTER — Emergency Department (HOSPITAL_COMMUNITY): Payer: Medicare HMO

## 2021-12-26 MED ORDER — OXYCODONE-ACETAMINOPHEN 5-325 MG PO TABS
2.0000 | ORAL_TABLET | Freq: Once | ORAL | Status: AC
  Administered 2021-12-26: 2 via ORAL
  Filled 2021-12-26: qty 2

## 2021-12-26 MED ORDER — OXYCODONE-ACETAMINOPHEN 5-325 MG PO TABS: 1.0000 | ORAL_TABLET | ORAL | 0 refills | Status: DC | PRN

## 2021-12-26 NOTE — ED Notes (Signed)
Rt groin swelliong for over a month  tonight the pain is worse  and he reports that the  hernia will not go back in  he has had hernia surgery previously ?

## 2021-12-26 NOTE — ED Notes (Signed)
Pt headed to CT scan ? ?

## 2021-12-26 NOTE — Discharge Instructions (Signed)
Avoid heavy lifting, pushing, or pulling. ?Follow-up with Dr. Grandville Silos in the surgery clinic to see about elective repair. ?If you notice hernia becoming stuck, changing color, or having uncontrolled pain, please return to the ED. ?

## 2021-12-26 NOTE — ED Notes (Signed)
To ct

## 2021-12-26 NOTE — ED Provider Notes (Signed)
?[facility] ?Provider Note ? ? ?CSN: [id number] ?Arrival date & time: 12/25/21  2050 ? ?  ? ?History ? ?Chief Complaint  ?Patient presents with  ? Groin Pain  ? ? ?Bradley Morgan is a 71 y.o. male. ? ?The history is provided by the patient and medical records.  ?Groin Pain ?Associated symptoms include abdominal pain.  ? ?71 year old male with history of hypertension, arthritis, presenting to the ED with right groin pain.  States ongoing for "a while" now but worsening today.  States when he stands he has swelling in the right groin.  Reports some difficulty urinating earlier today but denies hematuria.  No nausea/vomiting.  He is concerned he has a hernia.  Hx of same that was repaired earlier this year.  Also has solitary kidney. ? ?Home Medications ?Prior to Admission medications   ?Medication Sig Start Date End Date Taking? Authorizing Provider  ?oxyCODONE-acetaminophen (PERCOCET) 5-325 MG tablet Take 1 tablet by mouth every 4 (four) hours as needed. 12/26/21  Yes Larene Pickett, PA-C  ?aspirin EC 81 MG tablet Take 81 mg by mouth daily. Swallow whole.    [provider]  ?Carboxymethylcellul-Glycerin (LUBRICATING EYE DROPS OP) Place 1 drop into both eyes daily as needed (dry eyes).    [provider]  ?carvedilol (COREG) 3.125 MG tablet Take 3.125 mg by mouth 2 (two) times daily with a meal.     [provider]  ?clonazePAM (KLONOPIN) 0.5 MG tablet Take 0.5 mg by mouth 3 (three) times daily as needed for anxiety.    [provider]  ?cyclobenzaprine (FLEXERIL) 10 MG tablet Take 1 tablet (10 mg total) 3 (three) times daily as needed by mouth for muscle spasms. 08/04/17   [name], [name], PA-C  ?furosemide (LASIX) 20 MG tablet Take 20 mg by mouth daily as needed (fluid retention). 06/19/18   [provider]  ?gabapentin (NEURONTIN) 300 MG capsule Take 300 mg by mouth 2 (two) times daily.    [provider]  ?losartan  (COZAAR) 25 MG tablet Take 1 tablet (25 mg total) by mouth daily. 09/23/21 12/22/21  Sande Rives E, PA-C  ?meloxicam (MOBIC) 15 MG tablet Take 15 mg by mouth daily as needed for pain.    [provider]  ?oxyCODONE (OXY IR/ROXICODONE) 5 MG immediate release tablet Take 1 tablet (5 mg total) by mouth every 6 (six) hours as needed for severe pain. 08/03/21   Georganna Skeans, MD  ?pantoprazole (PROTONIX) 40 MG tablet Take 40 mg by mouth daily with breakfast.    [provider]  ?rOPINIRole (REQUIP) 1 MG tablet Take 1 mg by mouth See admin instructions. Take 1 mg by mouth at bedtime as needed for restless legs. May take additional 1 mg if needed. PATIENT TAKES DAILY    [provider]  ?rosuvastatin (CRESTOR) 20 MG tablet Take 1 tablet (20 mg total) by mouth daily. 10/07/21 01/05/22  Sande Rives E, PA-C  ?triamcinolone cream (KENALOG) 0.1 % Apply 1 application topically daily as needed (rash). ?Patient not taking: Reported on 09/23/2021 04/23/21   [provider]  ?   ? ?Allergies    ?Aspirin and Nsaids   ? ?Review of Systems   ?Review of Systems  ?Gastrointestinal:  Positive for abdominal pain.  ?All other systems reviewed and are negative. ? ?Physical Exam ?Updated Vital Signs ?BP (!) 151/98   Pulse 97   Temp 98.5 ?F (36.9 ?C)   Resp 18   Ht  $'6\' 1"'C$  (1.854 m)   Wt 103.7 kg   SpO2 99%   BMI 30.16 kg/m?  ? ?Physical Exam ?Vitals and nursing note reviewed.  ?Constitutional:   ?   Appearance: He is well-developed.  ?HENT:  ?   Head: Normocephalic and atraumatic.  ?Eyes:  ?   Conjunctiva/sclera: Conjunctivae normal.  ?   Pupils: Pupils are equal, round, and reactive to light.  ?Cardiovascular:  ?   Rate and Rhythm: Normal rate and regular rhythm.  ?   Heart sounds: Normal heart sounds.  ?Pulmonary:  ?   Effort: Pulmonary effort is normal.  ?   Breath sounds: Normal breath sounds.  ?Abdominal:  ?   General: Bowel sounds are normal.  ?   Palpations: Abdomen is soft.  ?Genitourinary: ?    Comments: Wife at bedside as chaperoned ?Right inguinal hernia present,reducible; no overlying skin changes; penis normal in appearance, testicles non-tender ?Musculoskeletal:     ?   General: Normal range of motion.  ?   Cervical back: Normal range of motion.  ?Skin: ?   General: Skin is warm and dry.  ?Neurological:  ?   Mental Status: He is alert and oriented to person, place, and time.  ? ? ?ED Results / Procedures / Treatments   ?Labs ?(all labs ordered are listed, but only abnormal results are displayed) ?Labs Reviewed  ?URINALYSIS, ROUTINE W REFLEX MICROSCOPIC - Abnormal; Notable for the following components:  ?    Result Value  ? Hgb urine dipstick SMALL (*)   ? Protein, ur 100 (*)   ? All other components within normal limits  ?CBC WITH DIFFERENTIAL/PLATELET - Abnormal; Notable for the following components:  ? Platelets 144 (*)   ? All other components within normal limits  ?COMPREHENSIVE METABOLIC PANEL - Abnormal; Notable for the following components:  ? Glucose, Bld 106 (*)   ? Creatinine, Ser 1.66 (*)   ? Total Protein 6.3 (*)   ? GFR, Estimated 45 (*)   ? All other components within normal limits  ? ? ?EKG ?None ? ?Radiology ?CT ABDOMEN PELVIS WO CONTRAST ? ?Result Date: 12/26/2021 ?CLINICAL DATA:  Right inguinal hernia EXAM: CT ABDOMEN AND PELVIS WITHOUT CONTRAST TECHNIQUE: Multidetector CT imaging of the abdomen and pelvis was performed following the standard protocol without IV contrast. RADIATION DOSE REDUCTION: This exam was performed according to the departmental dose-optimization program which includes automated exposure control, adjustment of the mA and/or kV according to patient size and/or use of iterative reconstruction technique. COMPARISON:  None. FINDINGS: Lower Chest: Normal. Hepatobiliary: Normal hepatic contours. No intra- or extrahepatic biliary dilatation. The gallbladder is normal. Pancreas: Normal pancreas. No ductal dilatation or peripancreatic fluid collection. Spleen: Normal.  Adrenals/Urinary Tract: Unchanged bilobed 3.7 cm left adrenal nodule with fat attenuation, consistent with myelolipoma,. No follow-up is necessary. No hydronephrosis, nephroureterolithiasis or solid renal mass. The urinary bladder is normal for degree of distention Stomach/Bowel: There is no hiatal hernia. Normal duodenal course and caliber. No small bowel dilatation or inflammation. No focal colonic abnormality. Normal appendix. Vascular/Lymphatic: There is calcific atherosclerosis of the abdominal aorta. No lymphadenopathy. Reproductive: Normal prostate size with symmetric seminal vesicles. Other: None. Musculoskeletal: No bony spinal canal stenosis or focal osseous abnormality. Bilateral total hip arthroplasty. IMPRESSION: 1. No acute abnormality of the abdomen or pelvis. 2. Unchanged left adrenal myelolipoma.  No follow-up is necessary. Aortic Atherosclerosis (ICD10-I70.0). Electronically Signed   By: Ulyses Jarred M.D.   On: 12/26/2021 01:36   ? ?Procedures ?Procedures  ? ? ?  Medications Ordered in ED ?Medications  ?oxyCODONE-acetaminophen (PERCOCET/ROXICET) 5-325 MG per tablet 2 tablet (2 tablets Oral Given 12/26/21 0112)  ? ? ?ED Course/ Medical Decision Making/ A&P ?  ?                        ?Medical Decision Making ?Amount and/or Complexity of Data Reviewed ?External Data Reviewed: labs and notes. ?Labs: ordered. ?Radiology: ordered and independent interpretation performed. ?ECG/medicine tests: ordered and independent interpretation performed. ? ?Risk ?Prescription drug management. ? ? ?71 year old male presenting to the ED with right groin pain.  Ongoing for "a while" but worse recently.  Feels a "bulge" when standing up in right groin.  Afebrile, nontoxic.  Does have apparent inguinal hernia.  This is reducible on exam.  No overlying skin changes.  No testicular swelling, masses, or tenderness noted.  Labs as above--renal function around baseline, has solitary kidney.  CT without acute findings.  Pain  improved after percocet.  Stable for discharge.  Encouraged to avoid heavy lifting, pushing, or pulling.  Monitor hernia for any signs of incarceration/strangulation including uncontrolled pain, skin changes, etc. e

## 2021-12-28 ENCOUNTER — Ambulatory Visit: Payer: Self-pay | Admitting: General Surgery

## 2021-12-28 ENCOUNTER — Telehealth: Payer: Self-pay | Admitting: *Deleted

## 2021-12-28 NOTE — Telephone Encounter (Signed)
? ?  Pre-operative Risk Assessment  ?  ?Patient Name: Bradley Morgan  ?DOB: 01/03/1951 ?MRN: [id number]  ? ?  ? ?Request for Surgical Clearance   ? ?Procedure:   RIGHT INGUINAL HERNIA REPAIR ? ?Date of Surgery:  Clearance TBD                              ?   ?Surgeon:  DR. BURK THOMPSON ?Surgeon's Group or Practice Name:  [facility] ?Phone number:  [phone] ?Fax number:  [phone] ATTN: CHARMELLA LITTLE, CNA ?  ?Type of Clearance Requested:   ?- Medical  ?- Pharmacy:  Hold Aspirin   ?  ?Type of Anesthesia:  General  ?  ?Additional requests/questions:   ? ?Signed, ?Julaine Hua   ?12/28/2021, 12:48 PM  ? ?

## 2021-12-28 NOTE — Telephone Encounter (Signed)
I did try to reach pt again before leaving for the night. Left message to call the office ASAP tomorrow for tele pre op appt. See previous notes.  ?

## 2021-12-28 NOTE — Telephone Encounter (Signed)
Per pre op provider clarify if procedure is urgent. Per requesting office the procedure is of urgent matter. Per pre op provider, we will set up tele appt. I left message for the pt to call the office ASAP so that we may set up tele pre op appt.  ?

## 2021-12-28 NOTE — H&P (Signed)
?PROVIDER:  Leanora Ivanoff, MD ?  ?MRN: [id number] ?DOB: [birth year] ?DATE OF ENCOUNTER: 12/28/2021 ?Subjective  ?   ?Chief Complaint: No chief complaint on file. ?  ?  ?  ?History of Present Illness: ?Bradley Morgan is a 71 y.o. male who is seen today for R inguinal hernia. ?Patient is known to me status post supraumbilical hernia repair November, 2022.  He was seen in emergency department over the weekend for a right inguinal hernia.  This newly developed when he was getting into the shower and he had significant pain in the region.  It goes in and out.  It usually goes back and when he lays down it comes back out when he stands up.  No bowel changes.  He underwent a CT without contrast due to his kidney history when he was seen in the emergency department on 4/8.  The hernia is visible at that time but there are no other bowel abnormalities seen. ?  ?Review of Systems: ?A complete review of systems was obtained from the patient.  I have reviewed this information and discussed as appropriate with the patient.  See HPI as well for other ROS. ?  ?ROS  ?  ?  ?Medical History: ?Past Medical History  ?    ?Past Medical History:  ?Diagnosis Date  ? Anxiety    ? GERD (gastroesophageal reflux disease)    ? Hyperlipidemia    ?  ?  ?  ?   ?Patient Active Problem List  ?Diagnosis  ? Bursitis of right elbow  ? Chronic low back pain  ? Chronic neck pain  ? Chronic pain syndrome  ? Chronic pain of both knees  ? Chronic shoulder pain  ? Dysuria  ? Erectile dysfunction  ? Essential hypertension  ? Failed total knee arthroplasty (CMS-HCC)  ? Generalized anxiety disorder  ? Hiatal hernia with GERD without esophagitis  ? Hyperlipemia  ? Neoplasm of respiratory system  ? Obesity  ? Opioid dependence in controlled environment (CMS-HCC)  ? Osteoarthritis of left hip  ? Osteoarthritis of multiple joints  ? Palpitations  ? Restless leg syndrome  ? Symptom of bladder outlet obstruction  ? Umbilical hernia without obstruction and  without gangrene  ?  ?  ?Past Surgical History  ?     ?Past Surgical History:  ?Procedure Laterality Date  ? ARTHROPLASTY HIP TOTAL Left 10/02/2018  ? ARTHROPLASTY HIP TOTAL Right 07/30/2019  ?  ?  ?  ?Allergies  ?     ?Allergies  ?Allergen Reactions  ? Opioids - Morphine Analogues Nausea and Shortness Of Breath  ? Aspirin Other (See Comments)  ?    No high doses ?bleeding ulcers  ? Meloxicam Other (See Comments)  ?    Upset stomach, abdominal pain  ?  ?  ?  ?      ?Current Outpatient Medications on File Prior to Visit  ?Medication Sig Dispense Refill  ? aspirin 81 MG EC tablet Take by mouth once daily      ? carvediloL (COREG) 3.125 MG tablet TAKE 1 TABLET TWICE DAILY WITH MEALS TO CONTROL BLOOD PRESSURE      ? clonazePAM (KLONOPIN) 0.5 MG tablet        ? doxazosin (CARDURA) 4 MG tablet Take one tablet at bedtime to improve bladder function      ? FUROsemide (LASIX) 20 MG tablet TAKE 1 TABLET IN THE MORNING AS NEEDED FOR FLUID RETENTION.      ? HYDROcodone-acetaminophen (  NORCO) 10-325 mg tablet        ? pantoprazole (PROTONIX) 40 MG DR tablet TAKE 1 TABLET EVERY DAY TO CONTROL ACID REFLUX SYMPTOMS      ? rOPINIRole (REQUIP) 1 MG tablet TAKE 1 TABLET AT BEDTIME FOR RESTLESS LEGS, MAY TAKE AN ADDITIONAL DOSE DURING THE NIGHT IF NEEDED.      ? simvastatin (ZOCOR) 40 MG tablet Take by mouth      ?  ?No current facility-administered medications on file prior to visit.  ?  ?  ?Family History  ?     ?Family History  ?Problem Relation Age of Onset  ? High blood pressure (Hypertension) Mother    ? High blood pressure (Hypertension) Father    ? Coronary Artery Disease (Blocked arteries around heart) Father    ? Diabetes Father    ?  ?  ?  ?Social History  ?  ?    ?Tobacco Use  ?Smoking Status Former  ? Types: Cigarettes  ? Quit date: 41  ? Years since quitting: 31.2  ?Smokeless Tobacco Never  ?  ?  ?Social History  ?Social History  ?  ?     ?Socioeconomic History  ? Marital status: Married  ?Tobacco Use  ? Smoking status:  Former  ?    Types: Cigarettes  ?    Quit date: 67  ?    Years since quitting: 31.2  ? Smokeless tobacco: Never  ?Substance and Sexual Activity  ? Alcohol use: Never  ? Drug use: Never  ?  ?  ?  ?Objective:  ?  ?  ?There were no vitals filed for this visit.  ?There is no height or weight on file to calculate BMI. ?  ?Physical Exam  ?  ?General appearance - alert, well appearing, and in no distress ?Heart - normal rate, regular rhythm, normal S1, S2, no murmurs, rubs, clicks or gallops ?Abdomen - soft, nontender, nondistended, no masses or organomegaly ?Supraumbilical hernia repair is intact ?GU Male - Both testes are descended.  No evidence of left inguinal hernia.  He has a moderate to large right inguinal hernia that does not extend into his scrotum.  It does reduce but spontaneously recurs.  Mild pain on examination.  No overlying skin changes. ?Musculoskeletal - no muscular tenderness noted ?  ?  ?Labs, Imaging and Diagnostic Testing: ?  ?CT above ?  ?  ?Assessment and Plan:  ?   ?Diagnoses and all orders for this visit: ?  ?Non-recurrent inguinal hernia of right side without obstruction or gangrene ?  ?  ?  ?Symptomatic right inguinal hernia -I will schedule urgent repair.  I discussed the procedure, risks, and benefits in detail with him.  He is agreeable and would very much like this fixed.  We will get clearance from his cardiologist, Dr. Marlou Porch.  I do not anticipate an issue as he tolerated his last hernia surgery well in November, 2022. ?  ?No follow-ups on file. ?Leanora Ivanoff, MD  ?  ?

## 2021-12-28 NOTE — Telephone Encounter (Signed)
Primary [place], MD ? ?Chart reviewed as part of pre-operative protocol coverage. Because of Bradley Morgan's past medical history and he will require a follow-up visit in order to better assess preoperative cardiovascular risk. ? ?Pre-op covering staff: ?- Appointment scheduled for 5/4 with Dr. Marlou Porch. Please call patient to inform them. Please ensure that surgery is not urgent.  ?- Please contact requesting surgeon's office via preferred method (i.e, phone, fax) to inform them of need for appointment prior to surgery. ? ?If applicable, this message will also be routed to pharmacy pool and/or primary cardiologist for input on holding anticoagulant/antiplatelet agent as requested below so that this information is available at time of patient's appointment.  ? ?Emmaline Life, NP-C ? ?  ?12/28/2021, 1:11 PM ?[facility] ?[address] ?Office [phone] Fax [phone] ? ?

## 2021-12-29 ENCOUNTER — Telehealth: Payer: Self-pay | Admitting: *Deleted

## 2021-12-29 NOTE — Telephone Encounter (Signed)
Pt agreeable to plan of care for tele pre op appt 12/30/21 @ 4 pm. Med rec and consent are done.  ?

## 2021-12-29 NOTE — Telephone Encounter (Signed)
I left message again today for the pt to call for tele pre op appt, see previous notes. I will send FYI to requesting office that we have been trying to reach the pt to schedule a telephone pre op appt, though pt has not returned our calls. I have left on the vm that Dr. Grandville Silos has urged this procedure to be more of urgent matter.  ? ?In hopes that requesting office does s/w the pt; please let him know to call his Dr. Marlou Porch office for a telephone appt for pre op clearance.   ?

## 2021-12-29 NOTE — Telephone Encounter (Signed)
Pt agreeable to plan of care for tele pre op appt 12/30/21 @ 4 pm. Med rec and consent are done.  ? ?  ?Patient Consent for Virtual Visit  ? ? ?   ? ?[facility] has provided verbal consent on 12/29/2021 for a virtual visit (video or telephone). ? ? ?CONSENT FOR VIRTUAL VISIT FOR:  Bradley Morgan  ?By participating in this virtual visit I agree to the following: ? ?I hereby voluntarily request, consent and authorize [facility] and its employed or contracted physicians, physician assistants, nurse practitioners or other licensed health care professionals (the Practitioner), to provide me with telemedicine health care services (the ?Services") as deemed necessary by the treating Practitioner. I acknowledge and consent to receive the Services by the Practitioner via telemedicine. I understand that the telemedicine visit will involve communicating with the Practitioner through live audiovisual communication technology and the disclosure of certain medical information by electronic transmission. I acknowledge that I have been given the opportunity to request an in-person assessment or other available alternative prior to the telemedicine visit and am voluntarily participating in the telemedicine visit. ? ?I understand that I have the right to withhold or withdraw my consent to the use of telemedicine in the course of my care at any time, without affecting my right to future care or treatment, and that the Practitioner or I may terminate the telemedicine visit at any time. I understand that I have the right to inspect all information obtained and/or recorded in the course of the telemedicine visit and may receive copies of available information for a reasonable fee.  I understand that some of the potential risks of receiving the Services via telemedicine include:  ?Delay or interruption in medical evaluation due to technological equipment failure or disruption; ?Information transmitted may not be sufficient (e.g.  poor resolution of images) to allow for appropriate medical decision making by the Practitioner; and/or  ?In rare instances, security protocols could fail, causing a breach of personal health information. ? ?Furthermore, I acknowledge that it is my responsibility to provide information about my medical history, conditions and care that is complete and accurate to the best of my ability. I acknowledge that Practitioner's advice, recommendations, and/or decision may be based on factors not within their control, such as incomplete or inaccurate data provided by me or distortions of diagnostic images or specimens that may result from electronic transmissions. I understand that the practice of medicine is not an exact science and that Practitioner makes no warranties or guarantees regarding treatment outcomes. I acknowledge that a copy of this consent can be made available to me via my patient portal ([facility]), or I can request a printed copy by calling the office of [facility].   ? ?I understand that my insurance will be billed for this visit.  ? ?I have read or had this consent read to me. ?I understand the contents of this consent, which adequately explains the benefits and risks of the Services being provided via telemedicine.  ?I have been provided ample opportunity to ask questions regarding this consent and the Services and have had my questions answered to my satisfaction. ?I give my informed consent for the services to be provided through the use of telemedicine in my medical care ? ? ? ?

## 2021-12-30 ENCOUNTER — Encounter: Payer: Self-pay | Admitting: Nurse Practitioner

## 2021-12-30 ENCOUNTER — Ambulatory Visit (INDEPENDENT_AMBULATORY_CARE_PROVIDER_SITE_OTHER): Payer: Medicare HMO | Admitting: Nurse Practitioner

## 2021-12-30 DIAGNOSIS — Z01810 Encounter for preprocedural cardiovascular examination: Secondary | ICD-10-CM | POA: Diagnosis not present

## 2021-12-30 NOTE — Progress Notes (Signed)
? ?Virtual Visit via Telephone Note  ? ?This visit type was conducted due to national recommendations for restrictions regarding the COVID-19 Pandemic (e.g. social distancing) in an effort to limit this patient's exposure and mitigate transmission in our community.  Due to his co-morbid illnesses, this patient is at least at moderate risk for complications without adequate follow up.  This format is felt to be most appropriate for this patient at this time.  The patient did not have access to video technology/had technical difficulties with video requiring transitioning to audio format only (telephone).  All issues noted in this document were discussed and addressed.  No physical exam could be performed with this format.  Please refer to the patient's chart for his  consent to telehealth for [facility]. ? ?Evaluation Performed:  Preoperative cardiovascular risk assessment ?_____________  ? ?Date:  12/30/2021  ? ?Patient ID:  Bradley Morgan, Bradley Morgan [birth year], MRN [id number] ?Patient Location:  ?Home ?Provider location:   ?Office ? ?Primary Care Provider:  Christain Sacramento, MD ?Primary Cardiologist:  Candee Furbish, MD ? ?Chief Complaint  ?  ?71 y.o. y/o male with a h/o chronic combined CHF, hypertension, aortic atherosclerosis, hyperlipidemia, adrenal adenoma, GERD, and remote tobacco use, who is pending right inguinal hernia repair, and presents today for telephonic preoperative cardiovascular risk assessment. ? ?Past Medical History  ?  ?Past Medical History:  ?Diagnosis Date  ? Adrenal adenoma, left   ? Arthritis   ? Bursitis of elbow   ? Complication of anesthesia   ? ANESTHESIA DID NOT TAKE EFFECT WITH BACK SURGERY "I FELT THEM CUT"  ? Congenital absence of right kidney   ? Coronary artery disease   ? Diverticulosis of colon   ? Family history of anesthesia complication   ? "hard time waking up my dad" many yrs ago  ? Frequency of urination   ? Full dentures   ? GAD (generalized anxiety disorder)   ? GERD  (gastroesophageal reflux disease)   ? History of acute renal failure   ? 12-16-2013;  09-24-2017 due to stone obstruction  ? History of duodenal ulcer 04/2000  ? w/ gi bleed post egd w/ cauterization (caused by chronic nsaid use)  ? History of encephalopathy 04/26/2014  ? ACUTE EPISODE SECONDARY TO DRUG OVERDOSE (NARC, BZD, FLEXERIL)  ? History of GI bleed   ? 08/ 2001   duodenal ulcer bleed post egd w/ cauterization  ? History of kidney stones   ? History of prostatitis   ? Hyperlipidemia   ? Hypertension   ? Left bundle branch block 05/05/2021  ? Left ureteral stone   ? Nocturia more than twice per night   ? Peripheral neuropathy   ? bilateral leg numbness due to back issues, walks w/ cane  ? Pneumonia   ? PONV (postoperative nausea and vomiting)   ? Restless leg syndrome   ? Solitary left kidney   ? Wears glasses   ? ?Past Surgical History:  ?Procedure Laterality Date  ? ANTERIOR CERVICAL DECOMP/DISCECTOMY FUSION  05-21-2009   dr Maia [facility]  ? C5-6, C6-7 and aviator plate  ? CARDIOVASCULAR STRESS TEST  12/17/2013  ? FIXED DEFECT IN THE INFEROSEPTAL REGION BUT NO STRESS INDUCED ISCHEMIA/  NORMAL LV FUNCTION AND WALL MOTION , EF 63%  ? CLOSED MANIPULATION POST TOTAL KNEE ARTHROPLASTY Left 11-28-2001    dr Wynelle Link  ? CYSTOSCOPY WITH RETROGRADE PYELOGRAM, URETEROSCOPY AND STENT PLACEMENT Left 10/07/2017  ? Procedure: CYSTOSCOPY WITH LEFT RETROGRADE PYELOGRAM, BASKETRY  OF STONE, URETEROSCOPY AND STENT REMOVAL;  Surgeon: Alexis Frock, MD;  Location: [facility];  Service: Urology;  Laterality: Left;  ? CYSTOSCOPY WITH STENT PLACEMENT Left 09/24/2017  ? Procedure: RENAL CYSTOSCOPY LEFT RETROGRADE, LEFT URETERAL STENT PLACEMENT;  Surgeon: Alexis Frock, MD;  Location: [place];  Service: Urology;  Laterality: Left;  ? HIP ARTHROSCOPY W/ LABRAL DEBRIDEMENT Left 08/12/2006   dr Wynelle Link [facility]  ? and chondraplasty  ? INCISION AND DRAINAGE ABSCESS Right 03/06/2015  ? Procedure: INCISION AND DRAINAGE ABSCESS RIGHT  ARM;  Surgeon: Daryll Brod, MD;  Location: [place];  Service: Orthopedics;  Laterality: Right;  ? INSERTION OF MESH N/A 08/03/2021  ? Procedure: INSERTION OF MESH;  Surgeon: Georganna Skeans, MD;  Location: [place];  Service: General;  Laterality: N/A;  ? KNEE ARTHROSCOPY Left x2  2002  ? LUMBAR SPINE SURGERY  x5  last one 2002  ? SHOULDER ARTHROSCOPY WITH SUBACROMIAL DECOMPRESSION, ROTATOR CUFF REPAIR AND BICEP TENDON REPAIR Right 06-27-2002  dr Wynelle Link [facility]  ? labral debridement and DCR  ? SHOULDER OPEN ROTATOR CUFF REPAIR Left 07/11/2014  ? Procedure: ROTATOR CUFF REPAIR SHOULDER OPEN WITH  GRAFT ;  Surgeon: Tobi Bastos, MD;  Location: [facility];  Service: Orthopedics;  Laterality: Left;  ? SUPRA-UMBILICAL HERNIA N/A 48/54/6270  ? Procedure: SUPRA-UMBILICAL HERNIA REPAIR;  Surgeon: Georganna Skeans, MD;  Location: [place];  Service: General;  Laterality: N/A;  ? TOTAL HIP ARTHROPLASTY Left 10/02/2018  ? Procedure: TOTAL HIP ARTHROPLASTY ANTERIOR APPROACH;  Surgeon: Gaynelle Arabian, MD;  Location: [facility];  Service: Orthopedics;  Laterality: Left;  178mn  ? TOTAL HIP ARTHROPLASTY Right 07/30/2019  ? Procedure: TOTAL HIP ARTHROPLASTY ANTERIOR APPROACH;  Surgeon: AGaynelle Arabian MD;  Location[facility];  Service: Orthopedics;  Laterality: Right;  1032m  ? TOTAL [facility] TOTAL KNEE REVISION Left 08/03/2017  ? Procedure: Left knee tibia polyethylene and patella revision;  Surgeon: AlGaynelle ArabianMD;  Location: [facility];  Service: Orthopedics;  Laterality: Left;  with abductor block  ? ? ?Allergies ? ?Allergies  ?Allergen Reactions  ? Aspirin Other (See Comments)  ?  Stomach ulcers. No high doses  ? Nsaids Other (See Comments)  ?  CAUSES STOMACH ULCERS   ? ? ?History of Present Illness  ?  ?Bradley MISHs a [age].o. male who presents via audio/video conferencing for a telehealth visit today.  Pt was last seen in cardiology clinic on 09/23/21 by CaSande RivesPA.   At that time WiJUANMIGUEL DEFELICEas doing well.  The patient is now pending right inguinal hernia repair.  Since his last visit, he denies specific cardiac complaints.  States he is limited in activity due to hernia pain.  He denies shortness of breath, edema, dyspnea, orthopnea, chest pain, presyncope, syncope, palpitations.  Reports that he takes aspirin just due to older age, no specific recommendation from a health care provider.  ? ? ?Home Medications  ?  ?Prior to Admission medications   ?Medication Sig Start Date End Date Taking? Authorizing Provider  ?aspirin EC 81 MG tablet Take 81 mg by mouth daily. Swallow whole.    [provider]  ?Carboxymethylcellul-Glycerin (LUBRICATING EYE DROPS OP) Place 1 drop into both eyes daily as needed (dry eyes).    [provider]  ?carvedilol (COREG) 3.125 MG tablet Take 3.125 mg by mouth 2 (two) times daily with a meal.  [provider]  ?clonazePAM (KLONOPIN) 0.5 MG tablet Take 0.5 mg by mouth 3 (three) times daily as needed for anxiety.    [provider]  ?cyclobenzaprine (FLEXERIL) 10 MG tablet Take 1 tablet (10 mg total) 3 (three) times daily as needed by mouth for muscle spasms. 08/04/17   [name], [name], PA-C  ?furosemide (LASIX) 20 MG tablet Take 20 mg by mouth daily as needed (fluid retention). 06/19/18   [provider]  ?gabapentin (NEURONTIN) 300 MG capsule Take 300 mg by mouth 2 (two) times daily.    [provider]  ?losartan (COZAAR) 25 MG tablet Take 1 tablet (25 mg total) by mouth daily. 09/23/21 12/29/21  Sande Rives E, PA-C  ?meloxicam (MOBIC) 15 MG tablet Take 15 mg by mouth daily as needed for pain.    [provider]  ?oxyCODONE (OXY IR/ROXICODONE) 5 MG immediate release tablet Take 1 tablet (5 mg total) by mouth every 6 (six) hours as needed for severe pain. 08/03/21   Georganna Skeans, MD  ?oxyCODONE-acetaminophen (PERCOCET) 5-325 MG tablet Take 1 tablet by mouth every 4 (four)  hours as needed. 12/26/21   Larene Pickett, PA-C  ?pantoprazole (PROTONIX) 40 MG tablet Take 40 mg by mouth daily with breakfast.    [provider]  ?rOPINIRole (REQUIP) 1 MG tablet Take 1 mg by mo

## 2022-01-15 NOTE — Pre-Procedure Instructions (Signed)
Surgical Instructions ? ? ? Your procedure is scheduled on Monday 01/25/22. ? ? Report to [facility] Entrance "A" at 11:00 A.M., then check in with the Admitting office. ? Call this number if you have problems the morning of surgery: ? [phone] ? ? If you have any questions prior to your surgery date call [phone]: Open Monday-Friday 8am-4pm ? ? ? Remember: ? Do not eat after midnight the night before your surgery ? ?You may drink clear liquids until 10:00 A.M. the morning of your surgery.   ?Clear liquids allowed are: Water, Non-Citrus Juices (without pulp), Carbonated Beverages, Clear Tea, Black Coffee ONLY (NO MILK, CREAM OR POWDERED CREAMER of any kind), and Gatorade ?  ? Take these medicines the morning of surgery with A SIP OF WATER:  ? carvedilol (COREG)  ? doxazosin (CARDURA)  ? gabapentin (NEURONTIN) ? HYDROcodone-acetaminophen ([place]) ? pantoprazole (PROTONIX) ? rosuvastatin (CRESTOR) ?  ? Take these medicines if needed: ? Carboxymethylcellul-Glycerin (LUBRICATING EYE  ?DROPS OP ? cyclobenzaprine (FLEXERIL) ? ? ?As of today, STOP taking any Aspirin (unless otherwise instructed by your surgeon) Aleve, Naproxen, Ibuprofen, Motrin, Advil, Goody's, BC's, all herbal medications, fish oil, meloxicam (MOBIC), and all vitamins. ? ?         ?Do not wear jewelry or makeup ?Do not wear lotions, powders, perfumes/colognes, or deodorant. ?Do not shave 48 hours prior to surgery.  Men may shave face and neck. ?Do not bring valuables to the hospital. ?Do not wear nail polish, gel polish, artificial nails, or any other type of covering on natural nails (fingers and toes) ?If you have artificial nails or gel coating that need to be removed by a nail salon, please have this removed prior to surgery. Artificial nails or gel coating may interfere with anesthesia's ability to adequately monitor your vital signs. ? ?[facility] is not responsible for any belongings or valuables. .  ? ?Do NOT Smoke (Tobacco/Vaping)  24  hours prior to your procedure ? ?If you use a CPAP at night, you may bring your mask for your overnight stay. ?  ?Contacts, glasses, hearing aids, dentures or partials may not be worn into surgery, please bring cases for these belongings ?  ?For patients admitted to the hospital, discharge time will be determined by your treatment team. ?  ?Patients discharged the day of surgery will not be allowed to drive home, and someone needs to stay with them for 24 hours. ? ? ?SURGICAL WAITING ROOM VISITATION ?Patients having surgery or a procedure in a hospital may have two support people. ?Children under the age of 29 must have an adult with them who is not the patient. ?They may stay in the waiting area during the procedure and may switch out with other visitors. If the patient needs to stay at the hospital during part of their recovery, the visitor guidelines for inpatient rooms apply. ? ?Please refer to the [facility] website for the visitor guidelines for Inpatients (after your surgery is over and you are in a regular room).  ? ? ? ? ? ?Special instructions:   ? ?Oral Hygiene is also important to reduce your risk of infection.  Remember - BRUSH YOUR TEETH THE MORNING OF SURGERY WITH YOUR REGULAR TOOTHPASTE ? ? ?[facility]- Preparing For Surgery ? ?Before surgery, you can play an important role. Because skin is not sterile, your skin needs to be as free of germs as possible. You can reduce the number of germs on your skin by washing with  CHG (chlorahexidine gluconate) Soap before surgery.  CHG is an antiseptic cleaner which kills germs and bonds with the skin to continue killing germs even after washing.   ? ? ?Please do not use if you have an allergy to CHG or antibacterial soaps. If your skin becomes reddened/irritated stop using the CHG.  ?Do not shave (including legs and underarms) for at least 48 hours prior to first CHG shower. It is OK to shave your face. ? ?Please follow these instructions carefully. ?  ? ?  Shower the NIGHT BEFORE SURGERY and the MORNING OF SURGERY with CHG Soap.  ? If you chose to wash your hair, wash your hair first as usual with your normal shampoo. After you shampoo, rinse your hair and body thoroughly to remove the shampoo.  Then [employer] and genitals (private parts) with your normal soap and rinse thoroughly to remove soap. ? ?After that Use CHG Soap as you would any other liquid soap. You can apply CHG directly to the skin and wash gently with a scrungie or a clean washcloth.  ? ?Apply the CHG Soap to your body ONLY FROM THE NECK DOWN.  Do not use on open wounds or open sores. Avoid contact with your eyes, ears, mouth and genitals (private parts). Wash Face and genitals (private parts)  with your normal soap.  ? ?Wash thoroughly, paying special attention to the area where your surgery will be performed. ? ?Thoroughly rinse your body with warm water from the neck down. ? ?DO NOT shower/wash with your normal soap after using and rinsing off the CHG Soap. ? ?Pat yourself dry with a CLEAN TOWEL. ? ?Wear CLEAN PAJAMAS to bed the night before surgery ? ?Place CLEAN SHEETS on your bed the night before your surgery ? ?DO NOT SLEEP WITH PETS. ? ? ?Day of Surgery: ? ?Take a shower with CHG soap. ?Wear Clean/Comfortable clothing the morning of surgery ?Do not apply any deodorants/lotions.   ?Remember to brush your teeth WITH YOUR REGULAR TOOTHPASTE. ? ? ? ?If you received a COVID test during your pre-op visit, it is requested that you wear a mask when out in public, stay away from anyone that may not be feeling well, and notify your surgeon if you develop symptoms. If you have been in contact with anyone that has tested positive in the last 10 days, please notify your surgeon. ? ?  ?Please read over the following fact sheets that you were given.  ? ?

## 2022-01-18 ENCOUNTER — Other Ambulatory Visit: Payer: Self-pay

## 2022-01-18 ENCOUNTER — Encounter (HOSPITAL_COMMUNITY)
Admission: RE | Admit: 2022-01-18 | Discharge: 2022-01-18 | Disposition: A | Discharge status: Home / Self Care | Payer: Medicare HMO | Source: Ambulatory Visit | Attending: General Surgery | Admitting: General Surgery

## 2022-01-18 ENCOUNTER — Encounter (HOSPITAL_COMMUNITY): Payer: Self-pay

## 2022-01-18 VITALS — BP 104/56 | HR 92 | Temp 97.8°F | Resp 18 | Ht 73.0 in | Wt 210.0 lb

## 2022-01-18 DIAGNOSIS — Z86018 Personal history of other benign neoplasm: Secondary | ICD-10-CM | POA: Diagnosis not present

## 2022-01-18 DIAGNOSIS — Z01812 Encounter for preprocedural laboratory examination: Secondary | ICD-10-CM | POA: Insufficient documentation

## 2022-01-18 DIAGNOSIS — Z01818 Encounter for other preprocedural examination: Secondary | ICD-10-CM

## 2022-01-18 DIAGNOSIS — K922 Gastrointestinal hemorrhage, unspecified: Secondary | ICD-10-CM | POA: Diagnosis not present

## 2022-01-18 DIAGNOSIS — Z87891 Personal history of nicotine dependence: Secondary | ICD-10-CM | POA: Insufficient documentation

## 2022-01-18 DIAGNOSIS — G4733 Obstructive sleep apnea (adult) (pediatric): Secondary | ICD-10-CM | POA: Insufficient documentation

## 2022-01-18 DIAGNOSIS — I10 Essential (primary) hypertension: Secondary | ICD-10-CM | POA: Insufficient documentation

## 2022-01-18 DIAGNOSIS — Z87442 Personal history of urinary calculi: Secondary | ICD-10-CM | POA: Diagnosis not present

## 2022-01-18 DIAGNOSIS — I447 Left bundle-branch block, unspecified: Secondary | ICD-10-CM | POA: Insufficient documentation

## 2022-01-18 DIAGNOSIS — K4090 Unilateral inguinal hernia, without obstruction or gangrene, not specified as recurrent: Secondary | ICD-10-CM | POA: Insufficient documentation

## 2022-01-18 DIAGNOSIS — I2510 Atherosclerotic heart disease of native coronary artery without angina pectoris: Secondary | ICD-10-CM | POA: Diagnosis not present

## 2022-01-18 DIAGNOSIS — E785 Hyperlipidemia, unspecified: Secondary | ICD-10-CM | POA: Insufficient documentation

## 2022-01-18 DIAGNOSIS — M1990 Unspecified osteoarthritis, unspecified site: Secondary | ICD-10-CM | POA: Insufficient documentation

## 2022-01-18 DIAGNOSIS — R0683 Snoring: Secondary | ICD-10-CM | POA: Insufficient documentation

## 2022-01-18 LAB — CBC
HCT: 41.4 % (ref 39.0–52.0)
Hemoglobin: 13.3 g/dL (ref 13.0–17.0)
MCH: 31.1 pg (ref 26.0–34.0)
MCHC: 32.1 g/dL (ref 30.0–36.0)
MCV: 97 fL (ref 80.0–100.0)
Platelets: 195 10*3/uL (ref 150–400)
RBC: 4.27 MIL/uL (ref 4.22–5.81)
RDW: 13.5 % (ref 11.5–15.5)
WBC: 6.2 10*3/uL (ref 4.0–10.5)
nRBC: 0 % (ref 0.0–0.2)

## 2022-01-18 LAB — BASIC METABOLIC PANEL
Anion gap: 8 (ref 5–15)
BUN: 28 mg/dL — ABNORMAL HIGH (ref 8–23)
CO2: 21 mmol/L — ABNORMAL LOW (ref 22–32)
Calcium: 9.2 mg/dL (ref 8.9–10.3)
Chloride: 108 mmol/L (ref 98–111)
Creatinine, Ser: 1.65 mg/dL — ABNORMAL HIGH (ref 0.61–1.24)
GFR, Estimated: 45 mL/min — ABNORMAL LOW (ref >60)
Glucose, Bld: 93 mg/dL (ref 70–99)
Potassium: 4.2 mmol/L (ref 3.5–5.1)
Sodium: 137 mmol/L (ref 135–145)

## 2022-01-18 NOTE — Progress Notes (Addendum)
PCP - Kathryne Eriksson, MD ?Cardiologist - Candee Furbish- pt reports he is going to see MD on Thursday 01/21/22. Clearance note in epic from 12/30/21. Pt denies any recent cardiac issues.  ? ?PPM/ICD - denies ?Chest x-ray - N/A ?EKG - 05/28/21 ?Stress Test - 06/19/21 ?ECHO - 06/18/21 ?Cardiac Cath - denies ? ?Sleep Study - pt denies ? ?Aspirin Instructions: Follow your surgeon's instructions on when to stop Aspirin.  If no instructions were given by your surgeon then you will need to call the office to get those instructions.   ? ?ERAS Protcol - ERAS order with no drink order.  ?COVID TEST- N/A ? ?BP 104/56 today at PAT- pt states he does not check his BP at home and does not know what his blood pressure normally is. Pt states he feels well today.  ? ?Anesthesia review: yes- heart history- follow up on cardiac clearance. See note from Myra Gianotti, [place] 07/29/21 from previous PAT workup.  ? ?Patient denies shortness of breath, fever, cough and chest pain at PAT appointment.  ? ? ?All instructions explained to the patient, with a verbal understanding of the material. Patient agrees to go over the instructions while at home for a better understanding. Patient also instructed to self quarantine after being tested for COVID-19. The opportunity to ask questions was provided. ? ? ?

## 2022-01-18 NOTE — Pre-Procedure Instructions (Signed)
Surgical Instructions ? ? ? Your procedure is scheduled on Monday 01/25/22. ? ? Report to [facility] Entrance "A" at 11:00 A.M., then check in with the Admitting office. ? Call this number if you have problems the morning of surgery: ? [phone] ? ? If you have any questions prior to your surgery date call [phone]: Open Monday-Friday 8am-4pm ? ? ? Remember: ? Do not eat after midnight the night before your surgery ? ?You may drink clear liquids until 10:00 A.M. the morning of your surgery.   ?Clear liquids allowed are: Water, Non-Citrus Juices (without pulp), Carbonated Beverages, Clear Tea, Black Coffee ONLY (NO MILK, CREAM OR POWDERED CREAMER of any kind), and Gatorade ?  ? Take these medicines the morning of surgery with A SIP OF WATER:  ? carvedilol (COREG)  ? doxazosin (CARDURA)  ? gabapentin (NEURONTIN) ? HYDROcodone-acetaminophen ([place]) ? pantoprazole (PROTONIX) ? rosuvastatin (CRESTOR) ?  ? Take these medicines if needed: ? Carboxymethylcellul-Glycerin (LUBRICATING EYE  ?DROPS OP ? cyclobenzaprine (FLEXERIL) ? ?Follow your surgeon's instructions on when to stop Aspirin.  If no instructions were given by your surgeon then you will need to call the office to get those instructions.   ? ?As of today, STOP taking any Aleve, Naproxen, Ibuprofen, Motrin, Advil, Goody's, BC's, all herbal medications, fish oil, meloxicam (MOBIC), and all vitamins. ? ?         ?Do not wear jewelry or makeup ?Do not wear lotions, powders, perfumes/colognes, or deodorant. ?Do not shave 48 hours prior to surgery.  Men may shave face and neck. ?Do not bring valuables to the hospital. ?Do not wear nail polish, gel polish, artificial nails, or any other type of covering on natural nails (fingers and toes) ?If you have artificial nails or gel coating that need to be removed by a nail salon, please have this removed prior to surgery. Artificial nails or gel coating may interfere with anesthesia's ability to adequately monitor  your vital signs. ? ?[facility] is not responsible for any belongings or valuables. .  ? ?Do NOT Smoke (Tobacco/Vaping)  24 hours prior to your procedure ? ?If you use a CPAP at night, you may bring your mask for your overnight stay. ?  ?Contacts, glasses, hearing aids, dentures or partials may not be worn into surgery, please bring cases for these belongings ?  ?For patients admitted to the hospital, discharge time will be determined by your treatment team. ?  ?Patients discharged the day of surgery will not be allowed to drive home, and someone needs to stay with them for 24 hours. ? ? ?SURGICAL WAITING ROOM VISITATION ?Patients having surgery or a procedure in a hospital may have two support people. ?Children under the age of 62 must have an adult with them who is not the patient. ?They may stay in the waiting area during the procedure and may switch out with other visitors. If the patient needs to stay at the hospital during part of their recovery, the visitor guidelines for inpatient rooms apply. ? ?Please refer to the [facility] website for the visitor guidelines for Inpatients (after your surgery is over and you are in a regular room).  ? ? ? ? ? ?Special instructions:   ? ?Oral Hygiene is also important to reduce your risk of infection.  Remember - BRUSH YOUR TEETH THE MORNING OF SURGERY WITH YOUR REGULAR TOOTHPASTE ? ? ?[facility]- Preparing For Surgery ? ?Before surgery, you can play an important role. Because skin is not  sterile, your skin needs to be as free of germs as possible. You can reduce the number of germs on your skin by washing with CHG (chlorahexidine gluconate) Soap before surgery.  CHG is an antiseptic cleaner which kills germs and bonds with the skin to continue killing germs even after washing.   ? ? ?Please do not use if you have an allergy to CHG or antibacterial soaps. If your skin becomes reddened/irritated stop using the CHG.  ?Do not shave (including legs and underarms) for at  least 48 hours prior to first CHG shower. It is OK to shave your face. ? ?Please follow these instructions carefully. ?  ? ? Shower the NIGHT BEFORE SURGERY and the MORNING OF SURGERY with CHG Soap.  ? If you chose to wash your hair, wash your hair first as usual with your normal shampoo. After you shampoo, rinse your hair and body thoroughly to remove the shampoo.  Then [employer] and genitals (private parts) with your normal soap and rinse thoroughly to remove soap. ? ?After that Use CHG Soap as you would any other liquid soap. You can apply CHG directly to the skin and wash gently with a scrungie or a clean washcloth.  ? ?Apply the CHG Soap to your body ONLY FROM THE NECK DOWN.  Do not use on open wounds or open sores. Avoid contact with your eyes, ears, mouth and genitals (private parts). Wash Face and genitals (private parts)  with your normal soap.  ? ?Wash thoroughly, paying special attention to the area where your surgery will be performed. ? ?Thoroughly rinse your body with warm water from the neck down. ? ?DO NOT shower/wash with your normal soap after using and rinsing off the CHG Soap. ? ?Pat yourself dry with a CLEAN TOWEL. ? ?Wear CLEAN PAJAMAS to bed the night before surgery ? ?Place CLEAN SHEETS on your bed the night before your surgery ? ?DO NOT SLEEP WITH PETS. ? ? ?Day of Surgery: ? ?Take a shower with CHG soap. ?Wear Clean/Comfortable clothing the morning of surgery ?Do not apply any deodorants/lotions.   ?Remember to brush your teeth WITH YOUR REGULAR TOOTHPASTE. ? ? ? ?If you received a COVID test during your pre-op visit, it is requested that you wear a mask when out in public, stay away from anyone that may not be feeling well, and notify your surgeon if you develop symptoms. If you have been in contact with anyone that has tested positive in the last 10 days, please notify your surgeon. ? ?  ?Please read over the following fact sheets that you were given.  ? ?

## 2022-01-19 NOTE — Anesthesia Preprocedure Evaluation (Addendum)
Anesthesia Evaluation  ?Patient identified by MRN, date of birth, ID band ?Patient awake ? ? ? ?Reviewed: ?Allergy & Precautions, NPO status , Patient's Chart, lab work & pertinent test results, reviewed documented beta blocker date and time  ? ?History of Anesthesia Complications ?(+) PONV, Family history of anesthesia reaction and history of anesthetic complications ? ?Airway ?Mallampati: I ? ?TM Distance: >3 FB ?Neck ROM: Full ? ? ? Dental ? ?(+) Edentulous Upper, Edentulous Lower ?  ?Pulmonary ?pneumonia, resolved, former smoker,  ?  ?Pulmonary exam normal ?breath sounds clear to auscultation ? ? ? ? ? ? Cardiovascular ?hypertension, Pt. on medications and Pt. on home beta blockers ?+ CAD and +CHF  ?Normal cardiovascular exam ?Rhythm:Regular Rate:Normal ? ?EKG 05/28/21 ?NSR, 1st deg AV block, LBBB pattern ? ?Echo 06/18/21 ?1. Left ventricular ejection fraction by 3D volume is 44 %. The left ventricle has mild to moderately decreased function. The left ventricle demonstrates global hypokinesis. Left ventricular diastolic parameters are  ?consistent with Grade I diastolic dysfunction (impaired relaxation).  ??2. Right ventricular systolic function is normal. The right ventricular size is normal.  ??3. The mitral valve is normal in structure. No evidence of mitral valve regurgitation. No evidence of mitral stenosis.  ??4. The aortic valve is normal in structure. Aortic valve regurgitation is mild to moderate. Mild aortic valve sclerosis is present, with no evidence of aortic valve stenosis.  ??5. The inferior vena cava is normal in size with greater than 50% respiratory variability, suggesting right atrial pressure of 3 mmHg.  ? ?Stress Test 06/19/21 ?Findings are equivocal. The study is intermediate risk. ??  No ST deviation was noted. ??  LV perfusion is equivocal. There is evidence of ischemia. 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 ??  Left ventricular function is abnormal. There were  multiple regional abnormalities. Nuclear stress EF: 39 %. The left ventricular ejection fraction is moderately decreased (30-44%). End systolic cavity size is normal. ??  Prior study available for comparison from 12/17/2013. No changes compared to prior study. ?? ?There is reduced perfusion in the mid-apical inferior myocardium that is slightly worse at stress than rest consistent with infarct with peri-infarct ischemia.  However, this was also noted on stress imaging in 2015.  Wall motion abnormality is more consistent with LBBB than infarct.  Suspect this is related to diaphragmatic attenuation rather than infarct with peri-infarct ischemia.  If clinical suspicion is high, consider coronary CT-A or invasive angiography. ? ?  ?Neuro/Psych ?PSYCHIATRIC DISORDERS Anxiety Peripheral neuropathy ? Neuromuscular disease   ? GI/Hepatic ?Neg liver ROS, GERD  Medicated and Controlled,  ?Endo/Other  ?Hyperlipidemia ? Renal/GU ?Renal diseaseCongenital absence right kidney ?Hx/o renal calculi  ? ?Hx/o prostatitis ? ?  ?Musculoskeletal ? ?(+) Arthritis , Osteoarthritis,   ? Abdominal ?  ?Peds ? Hematology ?negative hematology ROS ?(+)   ?Anesthesia Other Findings ? ? Reproductive/Obstetrics ? ?  ? ? ? ? ? ? ? ? ? ? ? ? ? ?  ?  ? ? ? ? ? ? ? ?Anesthesia Physical ?Anesthesia Plan ? ?ASA: 3 ? ?Anesthesia Plan:   ? ?Post-op Pain Management: Regional block*, Precedex and Dilaudid IV  ? ?Induction: Intravenous ? ?PONV Risk Score and Plan: 4 or greater and Treatment may vary due to age or medical condition, Ondansetron, Midazolam and Dexamethasone ? ?Airway Management Planned: LMA and Oral ETT ? ?Additional Equipment: None ? ?Intra-op Plan:  ? ?Post-operative Plan: Extubation in OR ? ?Informed Consent: I have reviewed the patients History and Physical,  chart, labs and discussed the procedure including the risks, benefits and alternatives for the proposed anesthesia with the patient or authorized representative who has indicated his/her  understanding and acceptance.  ? ? ? ?Dental advisory given ? ?Plan Discussed with: Anesthesiologist ? ?Anesthesia Plan Comments: (See APP note by Durel Salts, FNP )  ? ? ? ? ? ?Anesthesia Quick Evaluation ? ?

## 2022-01-19 NOTE — Progress Notes (Addendum)
Anesthesia Chart Review: ? ? Case: [id number] Date/Time: 01/25/22 1245  ? Procedure: OPEN RIGHT INGUINAL HERNIA REPAIR WITH MESH (Right)  ? Anesthesia type: General  ? Pre-op diagnosis: RIGHT INGUINAL HERNIA  ? [facility] / [facility]  ? Surgeons: Georganna Skeans, MD  ? ?  ? ? ?DISCUSSION: ?Pt is 71 years old with hx former smoker (quit 2000), HTN, CAD (denied history of LHC), LBBB (05/05/21), GI bleed (duodenal ulcer 04/2000), left adrenal adenoma (stable 4 cm 09/24/17 CT), HLD, absent right kidney, AKI (due to obstructive left ureteral stone, s/p stent/removal 09/2017), arthritis (s/p left TKA 10/09/01, patellar revision 08/04/17; left THA 10/02/18; right THA 07/30/19), spinal surgery (C5-7 ACDF 02/19/94); s/p supraumbilical hernia repair 28/41/32). Snoring with OSA suspected, but he declined sleep study (05/28/21 cardiology note) ? ?- Cr 1.65, same as Cr from ED visit 12/25/21. Cr has been slowly increasing since normal result 1.22 in august 2022.  Will route to PCP and cardiology as an FYI ? ? ?VS: BP (!) 104/56   Pulse 92   Temp 36.6 ?C   Resp 18   Ht '6\' 1"'$  (1.854 m)   Wt 95.3 kg   SpO2 98%   BMI 27.71 kg/m?  ? ?PROVIDERS: ?- PCP is Christain Sacramento, MD ?- Cardiologist is Candee Furbish, MD. Cleared for surgery at low risk at last visit 01/21/22 ? ? ?LABS: Labs reviewed: Acceptable for surgery. ?- Cr 1.65, same as Cr from ED visit 12/25/21. Cr has been slowly increasing since normal result 1.22 in august 2022.  Will route to PCP and cardiology as an FYI ? ?(all labs ordered are listed, but only abnormal results are displayed) ? ?Labs Reviewed  ?BASIC METABOLIC PANEL - Abnormal; Notable for the following components:  ?    Result Value  ? CO2 21 (*)   ? BUN 28 (*)   ? Creatinine, Ser 1.65 (*)   ? GFR, Estimated 45 (*)   ? All other components within normal limits  ?CBC  ? ? ?EKG 05/28/21: Sinus rhythm with first-degree AV block with PACs. LBBB ? ? ?CV: ?Myocardial Perfusion Study 06/19/21: ?  Findings are equivocal. The study  is intermediate risk. ?  No ST deviation was noted. ?  LV perfusion is equivocal. There is evidence of ischemia. 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 ?  Left ventricular function is abnormal. There were multiple regional abnormalities. Nuclear stress EF: 39 %. The left ventricular ejection fraction is moderately decreased (30-44%). End systolic cavity size is normal. ?  Prior study available for comparison from 12/17/2013. No changes compared to prior study. ?  ?There is reduced perfusion in the mid-apical inferior myocardium that is slightly worse at stress than rest consistent with infarct with peri-infarct ischemia.  However, this was also noted on stress imaging in 2015.  Wall motion abnormality is more consistent with LBBB than infarct.  Suspect this is related to diaphragmatic attenuation rather than infarct with peri-infarct ischemia.  If clinical suspicion is high, consider coronary CT-A or invasive angiography. ?- Results reviewed by Sande Rives, PA-C on 06/19/21, "Please notify patient of results: Stress test showed mild defect which was felt to likely be artifact but could also represent a small area of ischemia. However, no significant changes from prior study in 2015. Also reviewed Echo which showed mildly reduced pumping function of the heart which may be due to LBBB. Discussed with Dr. Marlou Porch - OK to proceed with hernia repair surgery..." ? ? ?Echo 06/18/21: ?1.  Left ventricular ejection fraction by 3D volume is 44 %. The left ventricle has mild to moderately decreased function. The left ventricle demonstrates global hypokinesis. Left ventricular diastolic parameters are consistent with Grade I diastolic  ?dysfunction (impaired relaxation).  ?2. Right ventricular systolic function is normal. The right ventricular size is normal.  ?3. The mitral valve is normal in structure. No evidence of mitral valve regurgitation. No evidence of mitral stenosis.  ?4. The aortic valve is normal in structure. Aortic  valve regurgitation is mild to moderate. Mild aortic valve sclerosis is present, with no evidence of aortic valve stenosis.  ?5. The inferior vena cava is normal in size with greater than 50% respiratory variability, suggesting right atrial pressure of 3 mmHg.  ?  ? ?Past Medical History:  ?Diagnosis Date  ? Adrenal adenoma, left   ? Arthritis   ? Bursitis of elbow   ? Complication of anesthesia   ? ANESTHESIA DID NOT TAKE EFFECT WITH BACK SURGERY "I FELT THEM CUT"  ? Congenital absence of right kidney   ? Coronary artery disease   ? Diverticulosis of colon   ? Family history of anesthesia complication   ? "hard time waking up my dad" many yrs ago  ? Frequency of urination   ? Full dentures   ? GAD (generalized anxiety disorder)   ? GERD (gastroesophageal reflux disease)   ? History of acute renal failure   ? 12-16-2013;  09-24-2017 due to stone obstruction  ? History of duodenal ulcer 04/2000  ? w/ gi bleed post egd w/ cauterization (caused by chronic nsaid use)  ? History of encephalopathy 04/26/2014  ? ACUTE EPISODE SECONDARY TO DRUG OVERDOSE (NARC, BZD, FLEXERIL)  ? History of GI bleed   ? 08/ 2001   duodenal ulcer bleed post egd w/ cauterization  ? History of kidney stones   ? History of prostatitis   ? Hyperlipidemia   ? Hypertension   ? Left bundle branch block 05/05/2021  ? Left ureteral stone   ? Nocturia more than twice per night   ? Peripheral neuropathy   ? bilateral leg numbness due to back issues, walks w/ cane  ? Pneumonia   ? PONV (postoperative nausea and vomiting)   ? Restless leg syndrome   ? Solitary left kidney   ? Wears glasses   ? ? ?Past Surgical History:  ?Procedure Laterality Date  ? ANTERIOR CERVICAL DECOMP/DISCECTOMY FUSION  05-21-2009   dr Maia Plan [facility]  ? C5-6, C6-7 and aviator plate  ? CARDIOVASCULAR STRESS TEST  12/17/2013  ? FIXED DEFECT IN THE INFEROSEPTAL REGION BUT NO STRESS INDUCED ISCHEMIA/  NORMAL LV FUNCTION AND WALL MOTION , EF 63%  ? CLOSED MANIPULATION POST TOTAL KNEE  ARTHROPLASTY Left 11-28-2001    dr Wynelle Link  ? CYSTOSCOPY WITH RETROGRADE PYELOGRAM, URETEROSCOPY AND STENT PLACEMENT Left 10/07/2017  ? Procedure: CYSTOSCOPY WITH LEFT RETROGRADE PYELOGRAM, BASKETRY OF STONE, URETEROSCOPY AND STENT REMOVAL;  Surgeon: Alexis Frock, MD;  Location: [facility];  Service: Urology;  Laterality: Left;  ? CYSTOSCOPY WITH STENT PLACEMENT Left 09/24/2017  ? Procedure: RENAL CYSTOSCOPY LEFT RETROGRADE, LEFT URETERAL STENT PLACEMENT;  Surgeon: Alexis Frock, MD;  Location: [place];  Service: Urology;  Laterality: Left;  ? HIP ARTHROSCOPY W/ LABRAL DEBRIDEMENT Left 08/12/2006   dr Wynelle Link [facility]  ? and chondraplasty  ? INCISION AND DRAINAGE ABSCESS Right 03/06/2015  ? Procedure: INCISION AND DRAINAGE ABSCESS RIGHT ARM;  Surgeon: Daryll Brod, MD;  Location: [facility];  Service: Orthopedics;  Laterality: Right;  ? INSERTION OF MESH N/A 08/03/2021  ? Procedure: INSERTION OF MESH;  Surgeon: Georganna Skeans, MD;  Location: [place];  Service: General;  Laterality: N/A;  ? KNEE ARTHROSCOPY Left x2  2002  ? LUMBAR SPINE SURGERY  x5  last one 2002  ? SHOULDER ARTHROSCOPY WITH SUBACROMIAL DECOMPRESSION, ROTATOR CUFF REPAIR AND BICEP TENDON REPAIR Right 06-27-2002  dr Wynelle Link [facility]  ? labral debridement and DCR  ? SHOULDER OPEN ROTATOR CUFF REPAIR Left 07/11/2014  ? Procedure: ROTATOR CUFF REPAIR SHOULDER OPEN WITH  GRAFT ;  Surgeon: Tobi Bastos, MD;  [facility];  Service: Orthopedics;  Laterality: Left;  ? SUPRA-UMBILICAL HERNIA N/A 56/15/3794  ? Procedure: SUPRA-UMBILICAL HERNIA REPAIR;  Surgeon: Georganna Skeans, MD;  Location: [place];  Service: General;  Laterality: N/A;  ? TOTAL HIP ARTHROPLASTY Left 10/02/2018  ? Procedure: TOTAL HIP ARTHROPLASTY ANTERIOR APPROACH;  Surgeon: Gaynelle Arabian, MD;  [facility];  Service: Orthopedics;  Laterality: Left;  143mn  ? TOTAL HIP ARTHROPLASTY Right 07/30/2019  ? Procedure: TOTAL HIP ARTHROPLASTY ANTERIOR APPROACH;  Surgeon:  AGaynelle Arabian MD;  [facility];  Service: Orthopedics;  Laterality: Right;  1051m  ? TOTAL KNEE [facility]09[facility]2001   [facility] TOTAL KNEE REVISION Left 08/03/2017  ? Procedure: Left knee tib

## 2022-01-21 ENCOUNTER — Ambulatory Visit: Payer: Medicare HMO | Admitting: Cardiology

## 2022-01-21 ENCOUNTER — Encounter: Payer: Self-pay | Admitting: Cardiology

## 2022-01-21 DIAGNOSIS — Z01810 Encounter for preprocedural cardiovascular examination: Secondary | ICD-10-CM | POA: Diagnosis not present

## 2022-01-21 DIAGNOSIS — I5022 Chronic systolic (congestive) heart failure: Secondary | ICD-10-CM | POA: Diagnosis not present

## 2022-01-21 DIAGNOSIS — E7800 Pure hypercholesterolemia, unspecified: Secondary | ICD-10-CM

## 2022-01-21 NOTE — Assessment & Plan Note (Signed)
Continue with Crestor. No Myalgias ?

## 2022-01-21 NOTE — Progress Notes (Signed)
?Cardiology Office Note:   ? ?Date:  01/21/2022  ? ?ID:  Bradley Morgan, DOB 01/11/1951, MRN [id number] ? ?PCP:  Christain Sacramento, MD ?  ?[facility] Providers ?Cardiologist:  Candee Furbish, MD    ? ?Referring MD: Christain Sacramento, MD  ? ? ?History of Present Illness:   ? ?Bradley Morgan is a 71 y.o. male now pending right inguinal hernia repair ? ?Past Medical History:  ?Diagnosis Date  ? Adrenal adenoma, left   ? Arthritis   ? Bursitis of elbow   ? Complication of anesthesia   ? ANESTHESIA DID NOT TAKE EFFECT WITH BACK SURGERY "I FELT THEM CUT"  ? Congenital absence of right kidney   ? Coronary artery disease   ? Diverticulosis of colon   ? Family history of anesthesia complication   ? "hard time waking up my dad" many yrs ago  ? Frequency of urination   ? Full dentures   ? GAD (generalized anxiety disorder)   ? GERD (gastroesophageal reflux disease)   ? History of acute renal failure   ? 12-16-2013;  09-24-2017 due to stone obstruction  ? History of duodenal ulcer 04/2000  ? w/ gi bleed post egd w/ cauterization (caused by chronic nsaid use)  ? History of encephalopathy 04/26/2014  ? ACUTE EPISODE SECONDARY TO DRUG OVERDOSE (NARC, BZD, FLEXERIL)  ? History of GI bleed   ? 08/ 2001   duodenal ulcer bleed post egd w/ cauterization  ? History of kidney stones   ? History of prostatitis   ? Hyperlipidemia   ? Hypertension   ? Left bundle branch block 05/05/2021  ? Left ureteral stone   ? Nocturia more than twice per night   ? Peripheral neuropathy   ? bilateral leg numbness due to back issues, walks w/ cane  ? Pneumonia   ? PONV (postoperative nausea and vomiting)   ? Restless leg syndrome   ? Solitary left kidney   ? Wears glasses   ? ? ?Past Surgical History:  ?Procedure Laterality Date  ? ANTERIOR CERVICAL DECOMP/DISCECTOMY FUSION  05-21-2009   dr Maia Plan [facility]  ? C5-6, C6-7 and aviator plate  ? CARDIOVASCULAR STRESS TEST  12/17/2013  ? FIXED DEFECT IN THE INFEROSEPTAL REGION BUT NO STRESS INDUCED ISCHEMIA/  NORMAL LV  FUNCTION AND WALL MOTION , EF 63%  ? CLOSED MANIPULATION POST TOTAL KNEE ARTHROPLASTY Left 11-28-2001    dr Wynelle Link  ? CYSTOSCOPY WITH RETROGRADE PYELOGRAM, URETEROSCOPY AND STENT PLACEMENT Left 10/07/2017  ? Procedure: CYSTOSCOPY WITH LEFT RETROGRADE PYELOGRAM, BASKETRY OF STONE, URETEROSCOPY AND STENT REMOVAL;  Surgeon: Alexis Frock, MD;  Location: [facility];  Service: Urology;  Laterality: Left;  ? CYSTOSCOPY WITH STENT PLACEMENT Left 09/24/2017  ? Procedure: RENAL CYSTOSCOPY LEFT RETROGRADE, LEFT URETERAL STENT PLACEMENT;  Surgeon: Alexis Frock, MD;  Location: [place];  Service: Urology;  Laterality: Left;  ? HIP ARTHROSCOPY W/ LABRAL DEBRIDEMENT Left 08/12/2006   dr Wynelle Link [facility]  ? and chondraplasty  ? INCISION AND DRAINAGE ABSCESS Right 03/06/2015  ? Procedure: INCISION AND DRAINAGE ABSCESS RIGHT ARM;  Surgeon: Daryll Brod, MD;  Location: [place];  Service: Orthopedics;  Laterality: Right;  ? INSERTION OF MESH N/A 08/03/2021  ? Procedure: INSERTION OF MESH;  Surgeon: Georganna Skeans, MD;  Location: [place];  Service: General;  Laterality: N/A;  ? KNEE ARTHROSCOPY Left x2  2002  ? LUMBAR SPINE SURGERY  x5  last one 2002  ? SHOULDER ARTHROSCOPY  WITH SUBACROMIAL DECOMPRESSION, ROTATOR CUFF REPAIR AND BICEP TENDON REPAIR Right 06-27-2002  dr Wynelle Link [facility]  ? labral debridement and DCR  ? SHOULDER OPEN ROTATOR CUFF REPAIR Left 07/11/2014  ? Procedure: ROTATOR CUFF REPAIR SHOULDER OPEN WITH  GRAFT ;  Surgeon: Tobi Bastos, MD;  [facility];  Service: Orthopedics;  Laterality: Left;  ? SUPRA-UMBILICAL HERNIA N/A 98/33/8250  ? Procedure: SUPRA-UMBILICAL HERNIA REPAIR;  Surgeon: Georganna Skeans, MD;  Location: [place];  Service: General;  Laterality: N/A;  ? TOTAL HIP ARTHROPLASTY Left 10/02/2018  ? Procedure: TOTAL HIP ARTHROPLASTY ANTERIOR APPROACH;  Surgeon: Gaynelle Arabian, MD;  [facility];  Service: Orthopedics;  Laterality: Left;  136mn  ? TOTAL HIP ARTHROPLASTY Right  07/30/2019  ? Procedure: TOTAL HIP ARTHROPLASTY ANTERIOR APPROACH;  Surgeon: AGaynelle Arabian MD;  [facility];  Service: Orthopedics;  Laterality: Right;  1070m  ? TOTAL KNEE ARTHROPLAST[facility]09-2001   [facility]? TOTAL KNEE REVISION Left 08/03/2017  ? Procedure: Left knee tibia polyethylene and patella revision;  Surgeon: AlGaynelle ArabianMD;  [facility];  Service: Orthopedics;  Laterality: Left;  with abductor block  ? ? ?Current Medications: ?Current Meds  ?Medication Sig  ? aspirin EC 81 MG tablet Take 81 mg by mouth daily. Swallow whole.  ? Carboxymethylcellul-Glycerin (LUBRICATING EYE DROPS OP) Place 1 drop into both eyes daily as needed (dry eyes).  ? carvedilol (COREG) 3.125 MG tablet Take 3.125 mg by mouth 2 (two) times daily with a meal.   ? clonazePAM (KLONOPIN) 0.5 MG tablet Take 0.5 mg by mouth 3 (three) times daily as needed for anxiety.  ? cyclobenzaprine (FLEXERIL) 10 MG tablet Take 1 tablet (10 mg total) 3 (three) times daily as needed by mouth for muscle spasms.  ? furosemide (LASIX) 20 MG tablet Take 20 mg by mouth daily as needed (fluid retention).  ? gabapentin (NEURONTIN) 300 MG capsule Take 300 mg by mouth 2 (two) times daily.  ? HYDROcodone-acetaminophen (NORCO) 10-325 MG tablet Take 1 tablet by mouth in the morning, at noon, and at bedtime.  ? losartan (COZAAR) 25 MG tablet Take 1 tablet (25 mg total) by mouth daily.  ? meloxicam (MOBIC) 15 MG tablet Take 15 mg by mouth daily.  ? pantoprazole (PROTONIX) 40 MG tablet Take 40 mg by mouth 2 (two) times daily.  ? rOPINIRole (REQUIP) 1 MG tablet Take 1 mg by mouth at bedtime.  ? rosuvastatin (CRESTOR) 20 MG tablet Take 1 tablet (20 mg total) by mouth daily.  ? triamcinolone cream (KENALOG) 0.1 % Apply 1 application. topically daily as needed (rash).  ?  ? ?Allergies:   Aspirin and Nsaids  ? ?Social History  ? ?Socioeconomic History  ? Marital status: Married  ?  Spouse name: Not on file  ? Number of children: Not on file  ?  Years of education: Not on file  ? Highest education level: Not on file  ?Occupational History  ? Not on file  ?Tobacco Use  ? Smoking status: Former  ?  Years: 15.00  ?  Types: Cigarettes  ?  Quit date: 10/09/1998  ?  Years since quitting: 23.3  ? Smokeless tobacco: Never  ?Vaping Use  ? Vaping Use: Never used  ?Substance and Sexual Activity  ? Alcohol use: No  ? Drug use: No  ? Sexual activity: Not on file  ?Other Topics Concern  ? Not on file  ?Social History Narrative  ? Not on file  ? ?  Social Determinants of Health  ? ?Financial Resource Strain: Not on file  ?Food Insecurity: Not on file  ?Transportation Needs: Not on file  ?Physical Activity: Not on file  ?Stress: Not on file  ?Social Connections: Not on file  ?  ? ?Family History: ?The patient's family history includes Anxiety disorder in his mother; Diabetes in his father; Heart failure in his father; Hypertension in his father. ? ?ROS:   ?Please see the history of present illness.    ? All other systems reviewed and are negative. ? ?EKGs/Labs/Other Studies Reviewed:   ? ?The following studies were reviewed today: ?Echocardiogram 06/18/2021: ?Impressions: ? 1. Left ventricular ejection fraction by 3D volume is 44 %. The left  ?ventricle has mild to moderately decreased function. The left ventricle  ?demonstrates global hypokinesis. Left ventricular diastolic parameters are  ?consistent with Grade I diastolic  ?dysfunction (impaired relaxation).  ? 2. Right ventricular systolic function is normal. The right ventricular  ?size is normal.  ? 3. The mitral valve is normal in structure. No evidence of mitral valve  ?regurgitation. No evidence of mitral stenosis.  ? 4. The aortic valve is normal in structure. Aortic valve regurgitation is  ?mild to moderate. Mild aortic valve sclerosis is present, with no evidence  ?of aortic valve stenosis.  ? 5. The inferior vena cava is normal in size with greater than 50%  ?respiratory variability, suggesting right atrial  pressure of 3 mmHg.  ?_______________ ?  ?Myoview 06/19/2021: ?  Findings are equivocal. The study is intermediate risk. ?  No ST deviation was noted. ?  LV perfusion is equivocal. There is evidence of ischemia.

## 2022-01-21 NOTE — Assessment & Plan Note (Signed)
Moderately reduced ejection fraction on prior echocardiogram in the mid 40 percent range.  Continue with current medication management, carvedilol 3.125 twice a day Lasix as needed 20 mg a day losartan 25 mg a day also on Crestor.  No changes made.  Ambulates with a cane.  No shortness of breath. ?

## 2022-01-21 NOTE — Assessment & Plan Note (Signed)
May proceed with hernia surgery.  Should be low overall cardiac risk.  He is not having any heart failure type symptoms.  No angina no arrhythmias.  Nuclear stress test reviewed as above. ?

## 2022-01-21 NOTE — Patient Instructions (Signed)
Medication Instructions:  ?The current medical regimen is effective;  continue present plan and medications. ? ?*If you need a refill on your cardiac medications before your next appointment, please call your pharmacy* ? ? ?Follow-Up: ?At [facility], you and your health needs are our priority.  As part of our continuing mission to provide you with exceptional heart care, we have created designated Provider Care Teams.  These Care Teams include your primary Cardiologist (physician) and Advanced Practice Providers (APPs -  Physician Assistants and Nurse Practitioners) who all work together to provide you with the care you need, when you need it. ? ?We recommend signing up for the patient portal called "MyChart".  Sign up information is provided on this After Visit Summary.  MyChart is used to connect with patients for Virtual Visits (Telemedicine).  Patients are able to view lab/test results, encounter notes, upcoming appointments, etc.  Non-urgent messages can be sent to your provider as well.   ?To learn more about what you can do with MyChart, go to [email or web address].   ? ?Your next appointment:   ?1 year(s) ? ?The format for your next appointment:   ?In Person ? ?Provider:   ?Candee Furbish, MD { ? ?Thank you for choosing [facility]!! ? ? ? ?Important Information About Sugar ? ? ? ? ?  ?

## 2022-01-25 ENCOUNTER — Encounter (HOSPITAL_COMMUNITY): Payer: Self-pay | Admitting: General Surgery

## 2022-01-25 ENCOUNTER — Other Ambulatory Visit: Payer: Self-pay

## 2022-01-25 ENCOUNTER — Ambulatory Visit (HOSPITAL_BASED_OUTPATIENT_CLINIC_OR_DEPARTMENT_OTHER): Payer: Medicare HMO | Admitting: Emergency Medicine

## 2022-01-25 ENCOUNTER — Encounter (HOSPITAL_COMMUNITY)
Admission: RE | Disposition: A | Discharge status: Home / Self Care | Payer: Self-pay | Source: Home / Self Care | Attending: General Surgery

## 2022-01-25 ENCOUNTER — Ambulatory Visit (HOSPITAL_COMMUNITY): Payer: Medicare HMO | Admitting: Emergency Medicine

## 2022-01-25 ENCOUNTER — Ambulatory Visit (HOSPITAL_COMMUNITY)
Admission: RE | Admit: 2022-01-25 | Discharge: 2022-01-25 | Disposition: A | Discharge status: Home / Self Care | Payer: Medicare HMO | Attending: General Surgery | Admitting: General Surgery

## 2022-01-25 DIAGNOSIS — Z905 Acquired absence of kidney: Secondary | ICD-10-CM | POA: Diagnosis not present

## 2022-01-25 DIAGNOSIS — Z87891 Personal history of nicotine dependence: Secondary | ICD-10-CM | POA: Diagnosis not present

## 2022-01-25 DIAGNOSIS — K4090 Unilateral inguinal hernia, without obstruction or gangrene, not specified as recurrent: Secondary | ICD-10-CM | POA: Insufficient documentation

## 2022-01-25 DIAGNOSIS — I509 Heart failure, unspecified: Secondary | ICD-10-CM | POA: Insufficient documentation

## 2022-01-25 DIAGNOSIS — F419 Anxiety disorder, unspecified: Secondary | ICD-10-CM | POA: Diagnosis not present

## 2022-01-25 DIAGNOSIS — Z79899 Other long term (current) drug therapy: Secondary | ICD-10-CM | POA: Insufficient documentation

## 2022-01-25 DIAGNOSIS — I110 Hypertensive heart disease with heart failure: Secondary | ICD-10-CM | POA: Insufficient documentation

## 2022-01-25 DIAGNOSIS — K219 Gastro-esophageal reflux disease without esophagitis: Secondary | ICD-10-CM | POA: Insufficient documentation

## 2022-01-25 DIAGNOSIS — I2510 Atherosclerotic heart disease of native coronary artery without angina pectoris: Secondary | ICD-10-CM | POA: Insufficient documentation

## 2022-01-25 HISTORY — PX: INGUINAL HERNIA REPAIR: SHX194

## 2022-01-25 HISTORY — PX: INSERTION OF MESH: SHX5868

## 2022-01-25 SURGERY — REPAIR, HERNIA, INGUINAL, ADULT
Anesthesia: General | Site: Inguinal | Laterality: Right

## 2022-01-25 MED ORDER — MIDAZOLAM HCL 2 MG/2ML IJ SOLN
INTRAMUSCULAR | Status: AC
  Administered 2022-01-25: 1 mg via INTRAVENOUS
  Filled 2022-01-25: qty 2

## 2022-01-25 MED ORDER — ONDANSETRON HCL 4 MG/2ML IJ SOLN
INTRAMUSCULAR | Status: AC
  Filled 2022-01-25: qty 2

## 2022-01-25 MED ORDER — BUPIVACAINE-EPINEPHRINE (PF) 0.25% -1:200000 IJ SOLN
INTRAMUSCULAR | Status: AC
  Filled 2022-01-25: qty 30

## 2022-01-25 MED ORDER — CHLORHEXIDINE GLUCONATE 0.12 % MT SOLN
15.0000 mL | Freq: Once | OROMUCOSAL | Status: AC
  Administered 2022-01-25: 15 mL via OROMUCOSAL
  Filled 2022-01-25: qty 15

## 2022-01-25 MED ORDER — MIDAZOLAM HCL 2 MG/2ML IJ SOLN: 1.0000 mg | Freq: Once | INTRAMUSCULAR | Status: AC

## 2022-01-25 MED ORDER — DEXAMETHASONE SODIUM PHOSPHATE 10 MG/ML IJ SOLN
INTRAMUSCULAR | Status: DC | PRN
  Administered 2022-01-25: 10 mg via INTRAVENOUS

## 2022-01-25 MED ORDER — OXYCODONE HCL 5 MG PO TABS
5.0000 mg | ORAL_TABLET | Freq: Once | ORAL | Status: AC | PRN
  Administered 2022-01-25: 5 mg via ORAL

## 2022-01-25 MED ORDER — HYDROMORPHONE HCL 1 MG/ML IJ SOLN
INTRAMUSCULAR | Status: AC
  Filled 2022-01-25: qty 1

## 2022-01-25 MED ORDER — LIDOCAINE 2% (20 MG/ML) 5 ML SYRINGE
INTRAMUSCULAR | Status: AC
  Filled 2022-01-25: qty 5

## 2022-01-25 MED ORDER — OXYCODONE HCL 5 MG/5ML PO SOLN: 5.0000 mg | Freq: Once | ORAL | Status: AC | PRN

## 2022-01-25 MED ORDER — SUGAMMADEX SODIUM 200 MG/2ML IV SOLN
INTRAVENOUS | Status: DC | PRN
Start: 2022-01-25 — End: 2022-01-25
  Administered 2022-01-25: 200 mg via INTRAVENOUS

## 2022-01-25 MED ORDER — ORAL CARE MOUTH RINSE: 15.0000 mL | Freq: Once | OROMUCOSAL | Status: AC

## 2022-01-25 MED ORDER — DEXAMETHASONE SODIUM PHOSPHATE 10 MG/ML IJ SOLN
INTRAMUSCULAR | Status: AC
  Filled 2022-01-25: qty 1

## 2022-01-25 MED ORDER — FENTANYL CITRATE (PF) 250 MCG/5ML IJ SOLN
INTRAMUSCULAR | Status: DC | PRN
  Administered 2022-01-25: 100 ug via INTRAVENOUS

## 2022-01-25 MED ORDER — CEFAZOLIN SODIUM-DEXTROSE 2-4 GM/100ML-% IV SOLN
2.0000 g | INTRAVENOUS | Status: AC
  Administered 2022-01-25: 2 g via INTRAVENOUS
  Filled 2022-01-25: qty 100

## 2022-01-25 MED ORDER — EPHEDRINE SULFATE-NACL 50-0.9 MG/10ML-% IV SOSY
PREFILLED_SYRINGE | INTRAVENOUS | Status: DC | PRN
  Administered 2022-01-25: 5 mg via INTRAVENOUS

## 2022-01-25 MED ORDER — FENTANYL CITRATE (PF) 100 MCG/2ML IJ SOLN: 100.0000 ug | Freq: Once | INTRAMUSCULAR | Status: AC

## 2022-01-25 MED ORDER — CHLORHEXIDINE GLUCONATE CLOTH 2 % EX PADS: 6.0000 | MEDICATED_PAD | Freq: Once | CUTANEOUS | Status: DC

## 2022-01-25 MED ORDER — PHENYLEPHRINE 80 MCG/ML (10ML) SYRINGE FOR IV PUSH (FOR BLOOD PRESSURE SUPPORT)
PREFILLED_SYRINGE | INTRAVENOUS | Status: AC
  Filled 2022-01-25: qty 10

## 2022-01-25 MED ORDER — FENTANYL CITRATE (PF) 100 MCG/2ML IJ SOLN
INTRAMUSCULAR | Status: AC
  Administered 2022-01-25: 100 ug via INTRAVENOUS
  Filled 2022-01-25: qty 2

## 2022-01-25 MED ORDER — OXYCODONE HCL 5 MG PO TABS: 5.0000 mg | ORAL_TABLET | Freq: Four times a day (QID) | ORAL | 0 refills | Status: DC | PRN

## 2022-01-25 MED ORDER — LACTATED RINGERS IV SOLN: INTRAVENOUS | Status: DC

## 2022-01-25 MED ORDER — PROPOFOL 10 MG/ML IV BOLUS
INTRAVENOUS | Status: DC | PRN
  Administered 2022-01-25: 120 mg via INTRAVENOUS

## 2022-01-25 MED ORDER — BUPIVACAINE LIPOSOME 1.3 % IJ SUSP
INTRAMUSCULAR | Status: DC | PRN
  Administered 2022-01-25: 10 mL via PERINEURAL

## 2022-01-25 MED ORDER — BUPIVACAINE HCL (PF) 0.5 % IJ SOLN
INTRAMUSCULAR | Status: DC | PRN
  Administered 2022-01-25: 20 mL via PERINEURAL

## 2022-01-25 MED ORDER — 0.9 % SODIUM CHLORIDE (POUR BTL) OPTIME
TOPICAL | Status: DC | PRN
Start: 2022-01-25 — End: 2022-01-25
  Administered 2022-01-25: 1000 mL

## 2022-01-25 MED ORDER — BUPIVACAINE-EPINEPHRINE 0.25% -1:200000 IJ SOLN
INTRAMUSCULAR | Status: DC | PRN
  Administered 2022-01-25: 10 mL

## 2022-01-25 MED ORDER — HYDROMORPHONE HCL 1 MG/ML IJ SOLN
INTRAMUSCULAR | Status: DC
Start: 2022-01-25 — End: 2022-01-25
  Filled 2022-01-25: qty 1

## 2022-01-25 MED ORDER — ROCURONIUM BROMIDE 10 MG/ML (PF) SYRINGE
PREFILLED_SYRINGE | INTRAVENOUS | Status: AC
  Filled 2022-01-25: qty 10

## 2022-01-25 MED ORDER — FENTANYL CITRATE (PF) 250 MCG/5ML IJ SOLN
INTRAMUSCULAR | Status: AC
  Filled 2022-01-25: qty 5

## 2022-01-25 MED ORDER — OXYCODONE HCL 5 MG PO TABS
ORAL_TABLET | ORAL | Status: AC
  Filled 2022-01-25: qty 1

## 2022-01-25 MED ORDER — ONDANSETRON HCL 4 MG/2ML IJ SOLN
INTRAMUSCULAR | Status: DC | PRN
  Administered 2022-01-25: 4 mg via INTRAVENOUS

## 2022-01-25 MED ORDER — MIDAZOLAM HCL 2 MG/2ML IJ SOLN
INTRAMUSCULAR | Status: AC
  Filled 2022-01-25: qty 2

## 2022-01-25 MED ORDER — EPHEDRINE 5 MG/ML INJ
INTRAVENOUS | Status: AC
  Filled 2022-01-25: qty 5

## 2022-01-25 MED ORDER — HYDROMORPHONE HCL 1 MG/ML IJ SOLN
0.2500 mg | INTRAMUSCULAR | Status: DC | PRN
  Administered 2022-01-25: 0.5 mg via INTRAVENOUS
  Administered 2022-01-25: 1 mg via INTRAVENOUS
  Administered 2022-01-25: 0.5 mg via INTRAVENOUS

## 2022-01-25 MED ORDER — ACETAMINOPHEN 500 MG PO TABS
1000.0000 mg | ORAL_TABLET | ORAL | Status: AC
  Administered 2022-01-25: 1000 mg via ORAL
  Filled 2022-01-25: qty 2

## 2022-01-25 MED ORDER — ROCURONIUM BROMIDE 10 MG/ML (PF) SYRINGE
PREFILLED_SYRINGE | INTRAVENOUS | Status: DC | PRN
  Administered 2022-01-25: 30 mg via INTRAVENOUS
  Administered 2022-01-25: 70 mg via INTRAVENOUS

## 2022-01-25 MED ORDER — PROPOFOL 10 MG/ML IV BOLUS
INTRAVENOUS | Status: AC
  Filled 2022-01-25: qty 20

## 2022-01-25 MED ORDER — PHENYLEPHRINE 80 MCG/ML (10ML) SYRINGE FOR IV PUSH (FOR BLOOD PRESSURE SUPPORT)
PREFILLED_SYRINGE | INTRAVENOUS | Status: DC | PRN
Start: 2022-01-25 — End: 2022-01-25
  Administered 2022-01-25: 160 ug via INTRAVENOUS
  Administered 2022-01-25: 80 ug via INTRAVENOUS
  Administered 2022-01-25: 160 ug via INTRAVENOUS
  Administered 2022-01-25 (×2): 40 ug via INTRAVENOUS
  Administered 2022-01-25: 80 ug via INTRAVENOUS

## 2022-01-25 MED ORDER — LIDOCAINE 2% (20 MG/ML) 5 ML SYRINGE
INTRAMUSCULAR | Status: DC | PRN
  Administered 2022-01-25: 80 mg via INTRAVENOUS

## 2022-01-25 MED ORDER — ONDANSETRON HCL 4 MG/2ML IJ SOLN: 4.0000 mg | Freq: Once | INTRAMUSCULAR | Status: DC | PRN

## 2022-01-25 SURGICAL SUPPLY — 40 items
ADH SKN CLS APL DERMABOND .7 (GAUZE/BANDAGES/DRESSINGS) ×1
APL PRP STRL LF DISP 70% ISPRP (MISCELLANEOUS) ×1
BAG COUNTER SPONGE SURGICOUNT (BAG) ×2 IMPLANT
BAG SPNG CNTER NS LX DISP (BAG) ×1
BLADE CLIPPER SURG (BLADE) IMPLANT
CANISTER SUCT 3000ML PPV (MISCELLANEOUS) IMPLANT
CHLORAPREP W/TINT 26 (MISCELLANEOUS) ×2 IMPLANT
COVER SURGICAL LIGHT HANDLE (MISCELLANEOUS) ×2 IMPLANT
DERMABOND ADVANCED (GAUZE/BANDAGES/DRESSINGS) ×1
DERMABOND ADVANCED .7 DNX12 (GAUZE/BANDAGES/DRESSINGS) ×1 IMPLANT
DRAIN PENROSE 1/2X12 LTX STRL (WOUND CARE) ×1 IMPLANT
DRAPE LAPAROTOMY 100X72 PEDS (DRAPES) ×2 IMPLANT
ELECT REM PT RETURN 9FT ADLT (ELECTROSURGICAL) ×2
ELECTRODE REM PT RTRN 9FT ADLT (ELECTROSURGICAL) ×1 IMPLANT
GLOVE BIO SURGEON STRL SZ8 (GLOVE) ×2 IMPLANT
GLOVE BIOGEL PI IND STRL 8 (GLOVE) ×1 IMPLANT
GLOVE BIOGEL PI INDICATOR 8 (GLOVE) ×1
GOWN STRL REUS W/ TWL LRG LVL3 (GOWN DISPOSABLE) ×1 IMPLANT
GOWN STRL REUS W/ TWL XL LVL3 (GOWN DISPOSABLE) ×1 IMPLANT
GOWN STRL REUS W/TWL LRG LVL3 (GOWN DISPOSABLE) ×2
GOWN STRL REUS W/TWL XL LVL3 (GOWN DISPOSABLE) ×2
KIT BASIN OR (CUSTOM PROCEDURE TRAY) ×2 IMPLANT
KIT TURNOVER KIT B (KITS) ×2 IMPLANT
MESH HERNIA 3X6 (Mesh General) ×1 IMPLANT
NEEDLE 22X1 1/2 (OR ONLY) (NEEDLE) ×2 IMPLANT
NS IRRIG 1000ML POUR BTL (IV SOLUTION) ×2 IMPLANT
PACK GENERAL/GYN (CUSTOM PROCEDURE TRAY) ×2 IMPLANT
PAD ARMBOARD 7.5X6 YLW CONV (MISCELLANEOUS) ×2 IMPLANT
PENCIL SMOKE EVACUATOR (MISCELLANEOUS) ×1 IMPLANT
SPECIMEN JAR SMALL (MISCELLANEOUS) IMPLANT
SUT MNCRL AB 4-0 PS2 18 (SUTURE) ×2 IMPLANT
SUT PROLENE 2 0 CT2 30 (SUTURE) ×6 IMPLANT
SUT VIC AB 2-0 SH 27 (SUTURE) ×2
SUT VIC AB 2-0 SH 27X BRD (SUTURE) ×1 IMPLANT
SUT VIC AB 3-0 SH 27 (SUTURE) ×2
SUT VIC AB 3-0 SH 27X BRD (SUTURE) ×1 IMPLANT
SUT VICRYL AB 3 0 TIES (SUTURE) IMPLANT
SYR CONTROL 10ML LL (SYRINGE) ×2 IMPLANT
TOWEL GREEN STERILE (TOWEL DISPOSABLE) ×2 IMPLANT
TOWEL GREEN STERILE FF (TOWEL DISPOSABLE) ×2 IMPLANT

## 2022-01-25 NOTE — Op Note (Addendum)
?01/25/2022 ? ?2:01 PM ? ?PATIENT:  Bradley Morgan  71 y.o. male ? ?PRE-OPERATIVE DIAGNOSIS:  RIGHT INGUINAL HERNIA ? ?POST-OPERATIVE DIAGNOSIS:  RIGHT INGUINAL HERNIA ? ?PROCEDURE:  Procedure(s): ?OPEN RIGHT INGUINAL HERNIA REPAIR WITH MESH (HERNIA DEFECT 1.5CM) ?INSERTION OF MESH ? ?SURGEON:  Surgeon(s): ?Georganna Skeans, MD ? ?ASSISTANTS: none  ? ?ANESTHESIA:   local, regional, and general ? ?EBL:  Total I/O ?In: 800 [I.V.:700; IV Piggyback:100] ?Out: 20 [Blood:20] ? ?BLOOD ADMINISTERED:none ? ?DRAINS: none  ? ?SPECIMEN:  No Specimen ? ?DISPOSITION OF SPECIMEN:  N/A ? ?COUNTS:  YES ? ?DICTATION: .Dragon Dictation ?Procedure in detail: Informed consent was obtained.  He received a TAP block by anesthesia.  He received intravenous antibiotics.  He was brought to the operating room and general endotracheal anesthesia was administered by the anesthesia staff.  His abdomen and both groins were prepped and draped in a sterile fashion.  A timeout procedure was performed.  Local was injected in the right inguinal region along the planned line of incision.  Was also injected up towards the anterior superior iliac spine.  A right inguinal incision was made.  Subcutaneous tissues were dissected down through Scarpa's fascia revealing the external oblique fascia.  This was quite attenuated medially due to the hernia.  The superior leaflet was dissected free off the transversalis and the inferior leaflet was dissected down revealing the shelving edge of the inguinal ligament.  Cord structures were encircled with a Penrose drain.  The floor felt okay.  There was an indirect hernia sac along the cord structures.  This was gently dissected away.  All of the contents were reduced and it was high ligated with a running 2-0 Vicryl.  Cord structures remained intact.  The inguinal branch of the ilioinguinal nerve looked like it was going to be in danger of scarring onto the mesh and that was sectioned.  The hernia was then repaired  with a keyhole polypropylene mesh.  This was cut to custom size and shape.  It was secured to the tissues over the pubic tubercle inferiorly with 2-0 Prolene.  It was sewn in a running fashion with 2-0 Prolene to the shelving edge of inguinal ligament.  It was then tacked down to the transversalis superiorly with multiple interrupted 2-0 Prolene's.  I also placed another stitch to the tissues over the pubic tubercle.  The 2 leaflets of the mesh were rejoined behind the cord structures and tucked out laterally underneath the external oblique fascia.  They were tacked together into the surrounding musculature with interrupted 2-0 Prolene's.  The aperture and the mesh just admitted the tip of the finger.  The cord structures appeared intact and not edematous.  The area was copiously irrigated and I obtained excellent hemostasis.  What I was able to close of the external oblique was closed with running 2-0 Vicryl.  Subcutaneous tissues were closed with interrupted 3-0 Vicryl.  The area was irrigated and the skin was closed with running 4-0 Monocryl subcuticular followed by Dermabond.  All counts were correct.  He tolerated the procedure well without apparent complication.  I relocated his right testicle in his scrotum in anatomic position at the completion of the procedure and he was taken recovery in stable condition. ?PATIENT DISPOSITION:  PACU - hemodynamically stable. ?  ?Delay start of Pharmacological VTE agent (>24hrs) due to surgical blood loss or risk of bleeding:  no ? ?Georganna Skeans, MD, MPH, FACS ?Pager: [phone]  ?5/8/20232:01 PM ? ? ? ? ? ? ? ? ? ? ? ? ?  ?

## 2022-01-25 NOTE — Anesthesia Procedure Notes (Signed)
Procedure Name: Intubation ?Date/Time: 01/25/2022 1:00 PM ?Performed by: Vonna Drafts, CRNA ?Pre-anesthesia Checklist: Patient identified, Emergency Drugs available, Suction available and Patient being monitored ?Patient Re-evaluated:Patient Re-evaluated prior to induction ?Oxygen Delivery Method: Circle system utilized ?Preoxygenation: Pre-oxygenation with 100% oxygen ?Induction Type: IV induction ?Ventilation: Oral airway inserted - appropriate to patient size and Two handed mask ventilation required ?Laryngoscope Size: Mac and 4 ?Grade View: Grade I ?Tube type: Oral ?Number of attempts: 1 ?Airway Equipment and Method: Stylet and Oral airway ?Placement Confirmation: ETT inserted through vocal cords under direct vision, positive ETCO2 and breath sounds checked- equal and bilateral ?Secured at: 22 cm ?Tube secured with: Tape ?Dental Injury: Teeth and Oropharynx as per pre-operative assessment  ? ? ? ? ?

## 2022-01-25 NOTE — Anesthesia Procedure Notes (Signed)
Anesthesia Regional Block: TAP block  ? ?Pre-Anesthetic Checklist: , timeout performed,  Correct Patient, Correct Site, Correct Laterality,  Correct Procedure, Correct Position, site marked,  Risks and benefits discussed,  Surgical consent,  Pre-op evaluation,  At surgeon's request and post-op pain management ? ?Laterality: Right ? ?Prep: chloraprep     ?  ?Needles:  ?Injection technique: Single-shot ? ?Needle Type: Echogenic Stimulator Needle   ? ? ?Needle Length: 10cm  ?Needle Gauge: 21  ? ?Needle insertion depth: 7 cm ? ? ?Additional Needles: ? ? ?Procedures:,,,, ultrasound used (permanent image in chart),,    ?Narrative:  ?Start time: 01/25/2022 12:37 PM ?End time: 01/25/2022 12:42 PM ?Injection made incrementally with aspirations every 5 mL. ? ?Performed by: Personally  ?Anesthesiologist: Josephine Igo, MD ? ?Additional Notes: ?Timeout performed. Patient sedated. Relevant anatomy ID'd using [place]. Incremental 2-49m injection of LA with frequent aspiration. Patient tolerated procedure well. ? ? ? ?Right TAP Block ? ?

## 2022-01-25 NOTE — Transfer of Care (Signed)
Immediate Anesthesia Transfer of Care Note ? ?Patient: Bradley Morgan ? ?Procedure(s) Performed: OPEN RIGHT INGUINAL HERNIA REPAIR WITH MESH (Right: Inguinal) ?INSERTION OF MESH (Right: Inguinal) ? ?Patient Location: PACU ? ?Anesthesia Type:General and Regional ? ?Level of Consciousness: drowsy ? ?Airway & Oxygen Therapy: Patient Spontanous Breathing and Patient connected to face mask oxygen ? ?Post-op Assessment: Report given to RN and Post -op Vital signs reviewed and stable ? ?Post vital signs: Reviewed and stable ? ?Last Vitals:  ?Vitals Value Taken Time  ?BP    ?Temp    ?Pulse 77 01/25/22 1406  ?Resp 20 01/25/22 1406  ?SpO2 93 % 01/25/22 1406  ?Vitals shown include unvalidated device data. ? ?Last Pain:  ?Vitals:  ? 01/25/22 1057  ?PainSc: 7   ?   ? ?Patients Stated Pain Goal: 0 (01/25/22 1057) ? ?Complications: No notable events documented. ?

## 2022-01-25 NOTE — Anesthesia Postprocedure Evaluation (Signed)
Anesthesia Post Note ? ?Patient: ARTAVIUS STEARNS ? ?Procedure(s) Performed: OPEN RIGHT INGUINAL HERNIA REPAIR WITH MESH (Right: Inguinal) ?INSERTION OF MESH (Right: Inguinal) ? ?  ? ?Patient location during evaluation: PACU ?Anesthesia Type: General ?Level of consciousness: awake and alert and oriented ?Pain management: pain level controlled ?Vital Signs Assessment: post-procedure vital signs reviewed and stable ?Respiratory status: spontaneous breathing, nonlabored ventilation and respiratory function stable ?Cardiovascular status: blood pressure returned to baseline and stable ?Postop Assessment: no apparent nausea or vomiting ?Anesthetic complications: no ? ? ?No notable events documented. ? ?Last Vitals:  ?Vitals:  ? 01/25/22 1437 01/25/22 1450  ?BP: (!) 145/66 (!) 129/93  ?Pulse: 74 71  ?Resp:    ?Temp:  36.4 ?C  ?SpO2: 95% 93%  ?  ?Last Pain:  ?Vitals:  ? 01/25/22 1450  ?PainSc: 6   ? ? ?  ?  ?  ?  ?  ?  ? ?Rosaelena Kemnitz A. ? ? ? ? ?

## 2022-01-25 NOTE — H&P (Signed)
?PROVIDER:  Leanora Ivanoff, MD ?  ?MRN: [id number] ?DOB: [birth year] ?DATE OF ENCOUNTER: 12/28/2021 ?Subjective  ?   ?Chief Complaint: No chief complaint on file. ?  ?  ?  ?History of Present Illness: ?Bradley Morgan is a 71 y.o. male who is seen today for R inguinal hernia. ?Patient is known to me status post supraumbilical hernia repair November, 2022.  He was seen in emergency department over the weekend for a right inguinal hernia.  This newly developed when he was getting into the shower and he had significant pain in the region.  It goes in and out.  It usually goes back and when he lays down it comes back out when he stands up.  No bowel changes.  He underwent a CT without contrast due to his kidney history when he was seen in the emergency department on 4/8.  The hernia is visible at that time but there are no other bowel abnormalities seen. ?  ?Review of Systems: ?A complete review of systems was obtained from the patient.  I have reviewed this information and discussed as appropriate with the patient.  See HPI as well for other ROS. ?  ?ROS  ?  ?  ?Medical History: ?Past Medical History  ?    ?Past Medical History:  ?Diagnosis Date  ? Anxiety    ? GERD (gastroesophageal reflux disease)    ? Hyperlipidemia    ?  ?  ?  ?   ?Patient Active Problem List  ?Diagnosis  ? Bursitis of right elbow  ? Chronic low back pain  ? Chronic neck pain  ? Chronic pain syndrome  ? Chronic pain of both knees  ? Chronic shoulder pain  ? Dysuria  ? Erectile dysfunction  ? Essential hypertension  ? Failed total knee arthroplasty (CMS-HCC)  ? Generalized anxiety disorder  ? Hiatal hernia with GERD without esophagitis  ? Hyperlipemia  ? Neoplasm of respiratory system  ? Obesity  ? Opioid dependence in controlled environment (CMS-HCC)  ? Osteoarthritis of left hip  ? Osteoarthritis of multiple joints  ? Palpitations  ? Restless leg syndrome  ? Symptom of bladder outlet obstruction  ? Umbilical hernia without obstruction and  without gangrene  ?  ?  ?Past Surgical History  ?     ?Past Surgical History:  ?Procedure Laterality Date  ? ARTHROPLASTY HIP TOTAL Left 10/02/2018  ? ARTHROPLASTY HIP TOTAL Right 07/30/2019  ?  ?  ?  ?Allergies  ?     ?Allergies  ?Allergen Reactions  ? Opioids - Morphine Analogues Nausea and Shortness Of Breath  ? Aspirin Other (See Comments)  ?    No high doses ?bleeding ulcers  ? Meloxicam Other (See Comments)  ?    Upset stomach, abdominal pain  ?  ?  ?  ?      ?Current Outpatient Medications on File Prior to Visit  ?Medication Sig Dispense Refill  ? aspirin 81 MG EC tablet Take by mouth once daily      ? carvediloL (COREG) 3.125 MG tablet TAKE 1 TABLET TWICE DAILY WITH MEALS TO CONTROL BLOOD PRESSURE      ? clonazePAM (KLONOPIN) 0.5 MG tablet        ? doxazosin (CARDURA) 4 MG tablet Take one tablet at bedtime to improve bladder function      ? FUROsemide (LASIX) 20 MG tablet TAKE 1 TABLET IN THE MORNING AS NEEDED FOR FLUID RETENTION.      ? HYDROcodone-acetaminophen (  NORCO) 10-325 mg tablet        ? pantoprazole (PROTONIX) 40 MG DR tablet TAKE 1 TABLET EVERY DAY TO CONTROL ACID REFLUX SYMPTOMS      ? rOPINIRole (REQUIP) 1 MG tablet TAKE 1 TABLET AT BEDTIME FOR RESTLESS LEGS, MAY TAKE AN ADDITIONAL DOSE DURING THE NIGHT IF NEEDED.      ? simvastatin (ZOCOR) 40 MG tablet Take by mouth      ?  ?No current facility-administered medications on file prior to visit.  ?  ?  ?Family History  ?     ?Family History  ?Problem Relation Age of Onset  ? High blood pressure (Hypertension) Mother    ? High blood pressure (Hypertension) Father    ? Coronary Artery Disease (Blocked arteries around heart) Father    ? Diabetes Father    ?  ?  ?  ?Social History  ?  ?    ?Tobacco Use  ?Smoking Status Former  ? Types: Cigarettes  ? Quit date: 69  ? Years since quitting: 31.2  ?Smokeless Tobacco Never  ?  ?  ?Social History  ?Social History  ?  ?     ?Socioeconomic History  ? Marital status: Married  ?Tobacco Use  ? Smoking status:  Former  ?    Types: Cigarettes  ?    Quit date: 68  ?    Years since quitting: 31.2  ? Smokeless tobacco: Never  ?Substance and Sexual Activity  ? Alcohol use: Never  ? Drug use: Never  ?  ?  ?  ?Objective:  ?  ?  ?There were no vitals filed for this visit.  ?There is no height or weight on file to calculate BMI. ?  ?Physical Exam  ?  ?General appearance - alert, well appearing, and in no distress ?Heart - normal rate, regular rhythm, normal S1, S2, no murmurs, rubs, clicks or gallops ?Abdomen - soft, nontender, nondistended, no masses or organomegaly ?Supraumbilical hernia repair is intact ?GU Male - Both testes are descended.  No evidence of left inguinal hernia.  He has a moderate to large right inguinal hernia that does not extend into his scrotum.  It does reduce but spontaneously recurs.  Mild pain on examination.  No overlying skin changes. ?Musculoskeletal - no muscular tenderness noted ?  ?  ?Labs, Imaging and Diagnostic Testing: ?  ?CT above ?  ?  ?Assessment and Plan:  ?   ?Diagnoses and all orders for this visit: ?  ?Non-recurrent inguinal hernia of right side without obstruction or gangrene ?  ?  ?  ?Symptomatic right inguinal hernia -I will schedule urgent repair.  I discussed the procedure, risks, and benefits in detail with him.  He is agreeable and would very much like this fixed.  We will get clearance from his cardiologist, Dr. Marlou Porch.  I do not anticipate an issue as he tolerated his last hernia surgery well in November, 2022. ?  ?No follow-ups on file. ?Leanora Ivanoff, MD  ? ? 01/25/22 no changes since this offoce visit. ?Site marked. ? ?Georganna Skeans, MD, MPH, FACS ?Please use [email or web address] to contact on call provider ? ?

## 2022-01-26 ENCOUNTER — Encounter (HOSPITAL_COMMUNITY): Payer: Self-pay | Admitting: General Surgery

## 2022-03-12 ENCOUNTER — Other Ambulatory Visit: Payer: Self-pay | Admitting: General Surgery

## 2022-03-12 DIAGNOSIS — R1909 Other intra-abdominal and pelvic swelling, mass and lump: Secondary | ICD-10-CM

## 2022-03-16 ENCOUNTER — Ambulatory Visit
Admission: RE | Admit: 2022-03-16 | Discharge: 2022-03-16 | Disposition: A | Discharge status: Home / Self Care | Payer: Medicare HMO | Source: Ambulatory Visit | Attending: General Surgery | Admitting: General Surgery

## 2022-03-16 DIAGNOSIS — R1909 Other intra-abdominal and pelvic swelling, mass and lump: Secondary | ICD-10-CM

## 2022-03-16 MED ORDER — IOPAMIDOL (ISOVUE-300) INJECTION 61%
100.0000 mL | Freq: Once | INTRAVENOUS | Status: AC | PRN
  Administered 2022-03-16: 100 mL via INTRAVENOUS

## 2022-05-17 ENCOUNTER — Other Ambulatory Visit: Payer: Self-pay | Admitting: Student

## 2022-07-24 ENCOUNTER — Other Ambulatory Visit: Payer: Self-pay | Admitting: Student

## 2023-02-10 ENCOUNTER — Ambulatory Visit: Payer: Medicare HMO | Attending: Cardiology | Admitting: Cardiology

## 2023-02-10 ENCOUNTER — Encounter: Payer: Self-pay | Admitting: Cardiology

## 2023-02-10 VITALS — BP 150/82 | HR 78 | Ht 73.0 in | Wt 208.6 lb

## 2023-02-10 DIAGNOSIS — I5022 Chronic systolic (congestive) heart failure: Secondary | ICD-10-CM

## 2023-02-10 DIAGNOSIS — E7800 Pure hypercholesterolemia, unspecified: Secondary | ICD-10-CM

## 2023-02-10 DIAGNOSIS — I447 Left bundle-branch block, unspecified: Secondary | ICD-10-CM | POA: Diagnosis not present

## 2023-02-10 NOTE — Patient Instructions (Signed)
Medication Instructions:  No changes *If you need a refill on your cardiac medications before your next appointment, please call your pharmacy*   Lab Work: none If you have labs (blood work) drawn today and your tests are completely normal, you will receive your results only by: MyChart Message (if you have MyChart) OR A paper copy in the mail If you have any lab test that is abnormal or we need to change your treatment, we will call you to review the results.   Testing/Procedures: none   Follow-Up: At [facility], you and your health needs are our priority.  As part of our continuing mission to provide you with exceptional heart care, we have created designated Provider Care Teams.  These Care Teams include your primary Cardiologist (physician) and Advanced Practice Providers (APPs -  Physician Assistants and Nurse Practitioners) who all work together to provide you with the care you need, when you need it.   Your next appointment:   6 month(s)  Provider:   Jari Favre, PA-C, Ronie Spies, PA-C, Robin Searing, NP, Jacolyn Reedy, PA-C, Eligha Bridegroom, NP, or Tereso Newcomer, PA-C

## 2023-02-10 NOTE — Progress Notes (Signed)
Cardiology Office Note:    Date:  02/10/2023   ID:  Bradley Morgan, DOB Apr 04, 1951, MRN [id number]  PCP:  Barbie Banner, MD   [facility] Providers Cardiologist:  Donato Schultz, MD     Referring MD: Barbie Banner, MD    History of Present Illness:    Bradley Morgan is a 72 y.o. male here for follow-up of chronic systolic heart failure with reduced ejection fraction 45% range.  Medications reviewed.  Previously saw him prior to inguinal hernia repair.  Successful hernia repair.  Ended up having another hernia repair a week after they state.  Main complaint right now is that he has no energy.  Does not have any chest pain or pressure.  Past Medical History:  Diagnosis Date   Adrenal adenoma, left    Arthritis    Bursitis of elbow    Complication of anesthesia    ANESTHESIA DID NOT TAKE EFFECT WITH BACK SURGERY "I FELT THEM CUT"   Congenital absence of right kidney    Coronary artery disease    Diverticulosis of colon    Family history of anesthesia complication    "hard time waking up my dad" many yrs ago   Frequency of urination    Full dentures    GAD (generalized anxiety disorder)    GERD (gastroesophageal reflux disease)    History of acute renal failure    12-16-2013;  09-24-2017 due to stone obstruction   History of duodenal ulcer 04/2000   w/ gi bleed post egd w/ cauterization (caused by chronic nsaid use)   History of encephalopathy 04/26/2014   ACUTE EPISODE SECONDARY TO DRUG OVERDOSE (NARC, BZD, FLEXERIL)   History of GI bleed    08/ 2001   duodenal ulcer bleed post egd w/ cauterization   History of kidney stones    History of prostatitis    Hyperlipidemia    Hypertension    Left bundle branch block 05/05/2021   Left ureteral stone    Nocturia more than twice per night    Peripheral neuropathy    bilateral leg numbness due to back issues, walks w/ cane   Pneumonia    PONV (postoperative nausea and vomiting)    Restless leg syndrome     Solitary left kidney    Wears glasses     Past Surgical History:  Procedure Laterality Date   ANTERIOR CERVICAL DECOMP/DISCECTOMY FUSION  05-21-2009   dr Marnee [facility]   C5-6, C6-7 and aviator plate   CARDIOVASCULAR STRESS TEST  12/17/2013   FIXED DEFECT IN THE INFEROSEPTAL REGION BUT NO STRESS INDUCED ISCHEMIA/  NORMAL LV FUNCTION AND WALL MOTION , EF 63%   CLOSED MANIPULATION POST TOTAL KNEE ARTHROPLASTY Left 11-28-2001    dr Lequita Halt   CYSTOSCOPY WITH RETROGRADE PYELOGRAM, URETEROSCOPY AND STENT PLACEMENT Left 10/07/2017   Procedure: CYSTOSCOPY WITH LEFT RETROGRADE PYELOGRAM, BASKETRY OF STONE, URETEROSCOPY AND STENT REMOVAL;  Surgeon: Sebastian Ache, MD;  Location: [facility];  Service: Urology;  Laterality: Left;   CYSTOSCOPY WITH STENT PLACEMENT Left 09/24/2017   Procedure: RENAL CYSTOSCOPY LEFT RETROGRADE, LEFT URETERAL STENT PLACEMENT;  Surgeon: Sebastian Ache, MD;  Location: [facility] OR;  Service: Urology;  Laterality: Left;   HIP ARTHROSCOPY W/ LABRAL DEBRIDEMENT Left 08/12/2006   dr Lequita Halt [facility]   and chondraplasty   INCISION AND DRAINAGE ABSCESS Right 03/06/2015   Procedure: INCISION AND DRAINAGE ABSCESS RIGHT ARM;  Surgeon: Cindee Salt, MD;  Location: [facility];  Service: Orthopedics;  Laterality: Right;   INGUINAL HERNIA REPAIR Right 01/25/2022   Procedure: OPEN RIGHT INGUINAL HERNIA REPAIR WITH MESH;  Surgeon: Violeta Gelinas, MD;  Location: [facility] OR;  Service: General;  Laterality: Right;   INSERTION OF MESH N/A 08/03/2021   Procedure: INSERTION OF MESH;  Surgeon: Violeta Gelinas, MD;  Location: [facility] OR;  Service: General;  Laterality: N/A;   INSERTION OF MESH Right 01/25/2022   Procedure: INSERTION OF MESH;  Surgeon: Violeta Gelinas, MD;  Location: [facility] OR;  Service: General;  Laterality: Right;   KNEE ARTHROSCOPY Left x2  2002   LUMBAR SPINE SURGERY  x5  last one 2002   SHOULDER ARTHROSCOPY WITH SUBACROMIAL DECOMPRESSION, ROTATOR CUFF REPAIR AND BICEP TENDON  REPAIR Right 06-27-2002  dr Lequita Halt [facility]   labral debridement and DCR   SHOULDER OPEN ROTATOR CUFF REPAIR Left 07/11/2014   Procedure: ROTATOR CUFF REPAIR SHOULDER OPEN WITH  GRAFT ;  Surgeon: Jacki Cones, MD;  Location: [facility];  Service: Orthopedics;  Laterality: Left;   SUPRA-UMBILICAL HERNIA N/A 08/03/2021   Procedure: SUPRA-UMBILICAL HERNIA REPAIR;  Surgeon: Violeta Gelinas, MD;  Location: [facility] OR;  Service: General;  Laterality: N/A;   TOTAL HIP ARTHROPLASTY Left 10/02/2018   Procedure: TOTAL HIP ARTHROPLASTY ANTERIOR APPROACH;  Surgeon: Ollen Gross, MD;  Location: [facility];  Service: Orthopedics;  Laterality: Left;    TOTAL HIP ARTHROPLASTY Right 07/30/2019   Procedure: TOTAL HIP ARTHROPLASTY ANTERIOR APPROACH;  Surgeon: Ollen Gross, MD;  Location: [facility];  Service: Orthopedics;  Laterality: Right;    TOTAL KNEE ARTHROPLASTY Left 10-09-2001   dr Lequita Halt [facility]   TOTAL KNEE REVISION Left 08/03/2017   Procedure: Left knee tibia polyethylene and patella revision;  Surgeon: Ollen Gross, MD;  Location: [facility];  Service: Orthopedics;  Laterality: Left;  with abductor block    Current Medications: Current Meds  Medication Sig   aspirin EC 81 MG tablet Take 81 mg by mouth daily. Swallow whole.   Carboxymethylcellul-Glycerin (LUBRICATING EYE DROPS OP) Place 1 drop into both eyes daily as needed (dry eyes).   carvedilol (COREG) 3.125 MG tablet Take 3.125 mg by mouth 2 (two) times daily with a meal.    clonazePAM (KLONOPIN) 0.5 MG tablet Take 0.5 mg by mouth 3 (three) times daily as needed for anxiety.   cyclobenzaprine (FLEXERIL) 10 MG tablet Take 1 tablet (10 mg total) 3 (three) times daily as needed by mouth for muscle spasms.   furosemide (LASIX) 20 MG tablet Take 20 mg by mouth daily as needed (fluid retention).   gabapentin (NEURONTIN) 300 MG capsule Take 300 mg by mouth 2 (two) times daily.   HYDROcodone-acetaminophen (NORCO) 7.5-325 MG tablet Take 1 tablet by mouth  every 6 (six) hours as needed for moderate pain.   pantoprazole (PROTONIX) 40 MG tablet Take 40 mg by mouth 2 (two) times daily.   rOPINIRole (REQUIP) 1 MG tablet Take 1 mg by mouth at bedtime.   rosuvastatin (CRESTOR) 20 MG tablet TAKE 1 TABLET BY MOUTH EVERY DAY   triamcinolone cream (KENALOG) 0.1 % Apply 1 application. topically daily as needed (rash).     Allergies:   Aspirin and Nsaids   Social History   Socioeconomic History   Marital status: Married    Spouse name: Not on file   Number of children: Not on file   Years of education: Not on file   Highest education level: Not on file  Occupational History   Not on file  Tobacco Use   Smoking status: Former    Years: 15    Types: Cigarettes    Quit date: 10/09/1998    Years since quitting: 24.3   Smokeless tobacco: Never  Vaping Use   Vaping Use: Never used  Substance and Sexual Activity   Alcohol use: No   Drug use: No   Sexual activity: Not on file  Other Topics Concern   Not on file  Social History Narrative   Not on file   Social Determinants of Health   Financial Resource Strain: Not on file  Food Insecurity: Not on file  Transportation Needs: Not on file  Physical Activity: Not on file  Stress: Not on file  Social Connections: Not on file     Family History: The patient's family history includes Anxiety disorder in his mother; Diabetes in his father; Heart failure in his father; Hypertension in his father.  ROS:   Please see the history of present illness.     All other systems reviewed and are negative.  EKGs/Labs/Other Studies Reviewed:    The following studies were reviewed today: Cardiac Studies & Procedures     STRESS TESTS  MYOCARDIAL PERFUSION IMAGING 06/19/2021  Narrative   Findings are equivocal. The study is intermediate risk.   No ST deviation was noted.   LV perfusion is equivocal. There is evidence of ischemia. 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   Left ventricular function is  abnormal. There were multiple regional abnormalities. Nuclear stress EF: 39 %. The left ventricular ejection fraction is moderately decreased (30-44%). End systolic cavity size is normal.   Prior study available for comparison from 12/17/2013. No changes compared to prior study.  There is reduced perfusion in the mid-apical inferior myocardium that is slightly worse at stress than rest consistent with infarct with peri-infarct ischemia.  However, this was also noted on stress imaging in 2015.  Wall motion abnormality is more consistent with LBBB than infarct.  Suspect this is related to diaphragmatic attenuation rather than infarct with peri-infarct ischemia.  If clinical suspicion is high, consider coronary CT-A or invasive angiography.   ECHOCARDIOGRAM  ECHOCARDIOGRAM COMPLETE 06/18/2021  Narrative ECHOCARDIOGRAM REPORT    Patient Name:   Bradley Morgan Date of Exam: 06/18/2021 Medical Rec #:  [id number]       Height:       73.0 in Accession #:    [id number]      Weight:       226.0 lb Date of Birth:  1951-04-27       BSA:          2.266 m Patient Age:    70 years        BP:           150/72 mmHg Patient Gender: M               HR:           87 bpm. Exam Location:  [address]  Procedure: 2D Echo, Cardiac Doppler, Color Doppler and 3D Echo  Indications:    I44.7 LBBB  History:        Patient has no prior history of Echocardiogram examinations. Risk Factors:Hypertension, Dyslipidemia, Former Smoker, Family History of Coronary Artery Disease and Obesity.  Sonographer:    Jorje Guild BS, RDCS Referring Phys: [id number] Corrin Parker   Sonographer Comments: Suboptimal parasternal window. IMPRESSIONS   1. Left ventricular ejection fraction by 3D volume is 44 %. The left ventricle has mild  to moderately decreased function. The left ventricle demonstrates global hypokinesis. Left ventricular diastolic parameters are consistent with Grade I diastolic dysfunction (impaired  relaxation). 2. Right ventricular systolic function is normal. The right ventricular size is normal. 3. The mitral valve is normal in structure. No evidence of mitral valve regurgitation. No evidence of mitral stenosis. 4. The aortic valve is normal in structure. Aortic valve regurgitation is mild to moderate. Mild aortic valve sclerosis is present, with no evidence of aortic valve stenosis. 5. The inferior vena cava is normal in size with greater than 50% respiratory variability, suggesting right atrial pressure of 3 mmHg.  FINDINGS Left Ventricle: Left ventricular ejection fraction by 3D volume is 44 %. The left ventricle has mild to moderately decreased function. The left ventricle demonstrates global hypokinesis. The left ventricular internal cavity size was normal in size. There is no left ventricular hypertrophy. Abnormal (paradoxical) septal motion, consistent with left bundle branch block. Left ventricular diastolic parameters are consistent with Grade I diastolic dysfunction (impaired relaxation).  Right Ventricle: The right ventricular size is normal. No increase in right ventricular wall thickness. Right ventricular systolic function is normal.  Left Atrium: Left atrial size was normal in size.  Right Atrium: Right atrial size was normal in size.  Pericardium: There is no evidence of pericardial effusion.  Mitral Valve: The mitral valve is normal in structure. No evidence of mitral valve regurgitation. No evidence of mitral valve stenosis.  Tricuspid Valve: The tricuspid valve is normal in structure. Tricuspid valve regurgitation is not demonstrated. No evidence of tricuspid stenosis.  Aortic Valve: The aortic valve is normal in structure. Aortic valve regurgitation is mild to moderate. Aortic regurgitation PHT measures 465 msec. Mild aortic valve sclerosis is present, with no evidence of aortic valve stenosis.  Pulmonic Valve: The pulmonic valve was normal in structure. Pulmonic  valve regurgitation is not visualized. No evidence of pulmonic stenosis.  Aorta: The aortic root is normal in size and structure.  Venous: The inferior vena cava is normal in size with greater than 50% respiratory variability, suggesting right atrial pressure of 3 mmHg.  IAS/Shunts: No atrial level shunt detected by color flow Doppler.   LEFT VENTRICLE PLAX 2D LVIDd:         4.90 cm         Diastology LVIDs:         4.10 cm         LV e' medial:    7.87 cm/s LV PW:         1.10 cm         LV E/e' medial:  9.8 LV IVS:        1.10 cm         LV e' lateral:   11.80 cm/s LVOT diam:     2.60 cm         LV E/e' lateral: 6.5 LV SV:         139 LV SV Index:   61 LVOT Area:     5.31 cm        3D Volume EF LV 3D EF:    Left ventricul ar ejection fraction by 3D volume is 44 %.  3D Volume EF: 3D EF:        44 % LV EDV:       246 ml LV ESV:       139 ml LV SV:        107 ml  RIGHT VENTRICLE  IVC RV Basal diam:  4.10 cm     IVC diam: 2.60 cm RV S prime:     14.00 cm/s TAPSE (M-mode): 3.4 cm  LEFT ATRIUM              Index       RIGHT ATRIUM           Index LA diam:        4.80 cm  2.12 cm/m  RA Pressure: 8.00 mmHg LA Vol (A2C):   111.0 ml 48.98 ml/m RA Area:     24.70 cm LA Vol (A4C):   99.1 ml  43.73 ml/m RA Volume:   75.80 ml  33.45 ml/m LA Biplane Vol: 107.0 ml 47.21 ml/m AORTIC VALVE LVOT Vmax:   136.00 cm/s LVOT Vmean:  89.300 cm/s LVOT VTI:    0.262 m AI PHT:      465 msec  AORTA Ao Root diam: 4.30 cm Ao Asc diam:  4.00 cm  MITRAL VALVE                TRICUSPID VALVE Estimated RAP:  8.00 mmHg MV Decel Time: 134 msec MV E velocity: 77.10 cm/s   SHUNTS MV A velocity: 130.00 cm/s  Systemic VTI:  0.26 m MV E/A ratio:  0.59         Systemic Diam: 2.60 cm  Donato Schultz MD Electronically signed by Donato Schultz MD Signature Date/Time: 06/18/2021/3:36:51 PM    Final              EKG: 02/10/2023 SR 70 bpm first-degree AV block 232 ms with left  bundle branch block chronic  Recent Labs: No results found for requested labs within last 365 days.  Recent Lipid Panel    Component Value Date/Time   CHOL 169 10/02/2021 0949   TRIG 111 10/02/2021 0949   HDL 46 10/02/2021 0949   CHOLHDL 3.7 10/02/2021 0949   CHOLHDL 5.0 12/16/2013 2034   VLDL 33 12/16/2013 2034   LDLCALC 103 (H) 10/02/2021 0949     Risk Assessment/Calculations:              Physical Exam:    VS:  BP (!) 150/82   Pulse 78   Ht 6\' 1"  (1.854 m)   Wt 208 lb 9.6 oz (94.6 kg)   SpO2 96%   BMI 27.52 kg/m     Wt Readings from Last 3 Encounters:  02/10/23 208 lb 9.6 oz (94.6 kg)  01/25/22 215 lb (97.5 kg)  01/21/22 213 lb 9.6 oz (96.9 kg)     GEN: Cane,  Well nourished, well developed in no acute distress HEENT: Normal NECK: No JVD; No carotid bruits LYMPHATICS: No lymphadenopathy CARDIAC: RRR, no murmurs, rubs, gallops RESPIRATORY:  Clear to auscultation without rales, wheezing or rhonchi  ABDOMEN: Soft, non-tender, non-distended MUSCULOSKELETAL:  No edema; No deformity  SKIN: Warm and dry NEUROLOGIC:  Alert and oriented x 3 PSYCHIATRIC:  Normal affect   ASSESSMENT:    1. Chronic systolic heart failure (HCC)   2. Pure hypercholesterolemia   3. LBBB (left bundle branch block)    PLAN:    In order of problems listed above:  Former smoker - Quit in the early 2000's.  Chronic systolic heart failure - EF 44% on echocardiogram September 2022.  Goal-directed medical therapy. -Myoview was intermediate risk with reduced perfusion in the mid apical inferior myocardium slightly worse at stress with infarct, peri-infarct ischemia.  This was noted in prior Myoview in 2015  as well.  Wall motion abnormality was more consistent with left bundle branch block than infarct.  It was suspected previously that he had had some diaphragmatic attenuation rather than true infarct. -On Lasix 20 mg as needed for edema, carvedilol 3.125 mg twice a day, losartan 25 mg  a day.  Compression hose.  Left bundle branch block - Chronic, no syncope.  Hypertension - Losartan started at previous office visit.  Creatinine 1.65  Hyperlipidemia - Prior LDL 103 with simvastatin 40 mg.  Now LDL is 62  Snoring - Previously offered sleep study but declined.  Decreased energy - Encourage activity, walking.  Ambulates with a cane.  This will help increase overall energy levels  Obviously if symptoms arise such as chest tightness or pressure he will let us know.  We can always investigate further.  6 months with APP         Medication Adjustments/Labs and Tests Ordered: Current medicines are reviewed at length with the patient today.  Concerns regarding medicines are outlined above.  Orders Placed This Encounter  Procedures   EKG 12-Lead   No orders of the defined types were placed in this encounter.   Patient Instructions  Medication Instructions:  No changes *If you need a refill on your cardiac medications before your next appointment, please call your pharmacy*   Lab Work: none If you have labs (blood work) drawn today and your tests are completely normal, you will receive your results only by: MyChart Message (if you have MyChart) OR A paper copy in the mail If you have any lab test that is abnormal or we need to change your treatment, we will call you to review the results.   Testing/Procedures: none   Follow-Up: At [facility], you and your health needs are our priority.  As part of our continuing mission to provide you with exceptional heart care, we have created designated Provider Care Teams.  These Care Teams include your primary Cardiologist (physician) and Advanced Practice Providers (APPs -  Physician Assistants and Nurse Practitioners) who all work together to provide you with the care you need, when you need it.   Your next appointment:   6 month(s)  Provider:   Jari Favre, PA-C, Ronie Spies, PA-C, Robin Searing, NP,  Jacolyn Reedy, PA-C, Eligha Bridegroom, NP, or Tereso Newcomer, PA-C        Signed, Donato Schultz, MD  02/10/2023 11:46 AM    [facility]

## 2023-02-14 ENCOUNTER — Other Ambulatory Visit: Payer: Self-pay | Admitting: Cardiology

## 2023-03-23 ENCOUNTER — Other Ambulatory Visit: Payer: Self-pay | Admitting: *Deleted

## 2023-03-23 MED ORDER — ROSUVASTATIN CALCIUM 20 MG PO TABS: 20.0000 mg | ORAL_TABLET | Freq: Every day | ORAL | 3 refills | Status: DC

## 2023-03-23 MED ORDER — LOSARTAN POTASSIUM 25 MG PO TABS: 25.0000 mg | ORAL_TABLET | Freq: Every day | ORAL | 3 refills | Status: DC

## 2023-04-27 ENCOUNTER — Emergency Department (HOSPITAL_COMMUNITY): Payer: Medicare HMO

## 2023-04-27 ENCOUNTER — Encounter (HOSPITAL_COMMUNITY): Payer: Self-pay

## 2023-04-27 ENCOUNTER — Inpatient Hospital Stay (HOSPITAL_COMMUNITY)
Admission: EM | Admit: 2023-04-27 | Discharge: 2023-05-22 | DRG: 870 | Disposition: E | Payer: Medicare HMO | Attending: Pulmonary Disease | Admitting: Pulmonary Disease

## 2023-04-27 DIAGNOSIS — N179 Acute kidney failure, unspecified: Secondary | ICD-10-CM | POA: Diagnosis present

## 2023-04-27 DIAGNOSIS — Z7982 Long term (current) use of aspirin: Secondary | ICD-10-CM

## 2023-04-27 DIAGNOSIS — D3502 Benign neoplasm of left adrenal gland: Secondary | ICD-10-CM | POA: Diagnosis present

## 2023-04-27 DIAGNOSIS — I5022 Chronic systolic (congestive) heart failure: Secondary | ICD-10-CM | POA: Diagnosis present

## 2023-04-27 DIAGNOSIS — I255 Ischemic cardiomyopathy: Secondary | ICD-10-CM | POA: Diagnosis present

## 2023-04-27 DIAGNOSIS — R823 Hemoglobinuria: Secondary | ICD-10-CM | POA: Diagnosis present

## 2023-04-27 DIAGNOSIS — Z818 Family history of other mental and behavioral disorders: Secondary | ICD-10-CM

## 2023-04-27 DIAGNOSIS — J150 Pneumonia due to Klebsiella pneumoniae: Secondary | ICD-10-CM | POA: Diagnosis not present

## 2023-04-27 DIAGNOSIS — E7800 Pure hypercholesterolemia, unspecified: Secondary | ICD-10-CM | POA: Diagnosis present

## 2023-04-27 DIAGNOSIS — Z96652 Presence of left artificial knee joint: Secondary | ICD-10-CM | POA: Diagnosis present

## 2023-04-27 DIAGNOSIS — R410 Disorientation, unspecified: Secondary | ICD-10-CM

## 2023-04-27 DIAGNOSIS — I130 Hypertensive heart and chronic kidney disease with heart failure and stage 1 through stage 4 chronic kidney disease, or unspecified chronic kidney disease: Secondary | ICD-10-CM | POA: Diagnosis present

## 2023-04-27 DIAGNOSIS — R809 Proteinuria, unspecified: Secondary | ICD-10-CM | POA: Diagnosis present

## 2023-04-27 DIAGNOSIS — A409 Streptococcal sepsis, unspecified: Principal | ICD-10-CM | POA: Diagnosis present

## 2023-04-27 DIAGNOSIS — Z886 Allergy status to analgesic agent status: Secondary | ICD-10-CM

## 2023-04-27 DIAGNOSIS — J13 Pneumonia due to Streptococcus pneumoniae: Secondary | ICD-10-CM | POA: Diagnosis present

## 2023-04-27 DIAGNOSIS — E875 Hyperkalemia: Secondary | ICD-10-CM | POA: Diagnosis not present

## 2023-04-27 DIAGNOSIS — I5082 Biventricular heart failure: Secondary | ICD-10-CM | POA: Diagnosis present

## 2023-04-27 DIAGNOSIS — Z96643 Presence of artificial hip joint, bilateral: Secondary | ICD-10-CM | POA: Diagnosis present

## 2023-04-27 DIAGNOSIS — Q600 Renal agenesis, unilateral: Secondary | ICD-10-CM

## 2023-04-27 DIAGNOSIS — I5023 Acute on chronic systolic (congestive) heart failure: Secondary | ICD-10-CM | POA: Diagnosis not present

## 2023-04-27 DIAGNOSIS — Y92009 Unspecified place in unspecified non-institutional (private) residence as the place of occurrence of the external cause: Secondary | ICD-10-CM

## 2023-04-27 DIAGNOSIS — M6282 Rhabdomyolysis: Secondary | ICD-10-CM | POA: Diagnosis not present

## 2023-04-27 DIAGNOSIS — E870 Hyperosmolality and hypernatremia: Secondary | ICD-10-CM | POA: Diagnosis not present

## 2023-04-27 DIAGNOSIS — R34 Anuria and oliguria: Secondary | ICD-10-CM | POA: Diagnosis present

## 2023-04-27 DIAGNOSIS — Z8711 Personal history of peptic ulcer disease: Secondary | ICD-10-CM

## 2023-04-27 DIAGNOSIS — Z8249 Family history of ischemic heart disease and other diseases of the circulatory system: Secondary | ICD-10-CM

## 2023-04-27 DIAGNOSIS — D649 Anemia, unspecified: Secondary | ICD-10-CM | POA: Diagnosis present

## 2023-04-27 DIAGNOSIS — J1282 Pneumonia due to coronavirus disease 2019: Secondary | ICD-10-CM | POA: Diagnosis present

## 2023-04-27 DIAGNOSIS — F05 Delirium due to known physiological condition: Secondary | ICD-10-CM | POA: Diagnosis present

## 2023-04-27 DIAGNOSIS — U071 COVID-19: Secondary | ICD-10-CM | POA: Diagnosis present

## 2023-04-27 DIAGNOSIS — R5381 Other malaise: Secondary | ICD-10-CM | POA: Diagnosis present

## 2023-04-27 DIAGNOSIS — Z515 Encounter for palliative care: Secondary | ICD-10-CM

## 2023-04-27 DIAGNOSIS — Z79899 Other long term (current) drug therapy: Secondary | ICD-10-CM

## 2023-04-27 DIAGNOSIS — G9341 Metabolic encephalopathy: Secondary | ICD-10-CM | POA: Diagnosis present

## 2023-04-27 DIAGNOSIS — Z9911 Dependence on respirator [ventilator] status: Secondary | ICD-10-CM

## 2023-04-27 DIAGNOSIS — E872 Acidosis, unspecified: Secondary | ICD-10-CM | POA: Diagnosis present

## 2023-04-27 DIAGNOSIS — Z833 Family history of diabetes mellitus: Secondary | ICD-10-CM

## 2023-04-27 DIAGNOSIS — Z87442 Personal history of urinary calculi: Secondary | ICD-10-CM

## 2023-04-27 DIAGNOSIS — Z66 Do not resuscitate: Secondary | ICD-10-CM | POA: Diagnosis not present

## 2023-04-27 DIAGNOSIS — E878 Other disorders of electrolyte and fluid balance, not elsewhere classified: Secondary | ICD-10-CM | POA: Diagnosis present

## 2023-04-27 DIAGNOSIS — Y95 Nosocomial condition: Secondary | ICD-10-CM | POA: Diagnosis not present

## 2023-04-27 DIAGNOSIS — I447 Left bundle-branch block, unspecified: Secondary | ICD-10-CM | POA: Diagnosis present

## 2023-04-27 DIAGNOSIS — I2510 Atherosclerotic heart disease of native coronary artery without angina pectoris: Secondary | ICD-10-CM | POA: Diagnosis present

## 2023-04-27 DIAGNOSIS — R6520 Severe sepsis without septic shock: Secondary | ICD-10-CM | POA: Diagnosis present

## 2023-04-27 DIAGNOSIS — Z87891 Personal history of nicotine dependence: Secondary | ICD-10-CM

## 2023-04-27 DIAGNOSIS — I472 Ventricular tachycardia, unspecified: Secondary | ICD-10-CM | POA: Diagnosis not present

## 2023-04-27 DIAGNOSIS — W19XXXA Unspecified fall, initial encounter: Secondary | ICD-10-CM | POA: Diagnosis present

## 2023-04-27 DIAGNOSIS — J189 Pneumonia, unspecified organism: Principal | ICD-10-CM | POA: Diagnosis present

## 2023-04-27 DIAGNOSIS — N1831 Chronic kidney disease, stage 3a: Secondary | ICD-10-CM | POA: Diagnosis present

## 2023-04-27 DIAGNOSIS — K219 Gastro-esophageal reflux disease without esophagitis: Secondary | ICD-10-CM | POA: Diagnosis present

## 2023-04-27 DIAGNOSIS — Z1624 Resistance to multiple antibiotics: Secondary | ICD-10-CM | POA: Diagnosis present

## 2023-04-27 DIAGNOSIS — I10 Essential (primary) hypertension: Secondary | ICD-10-CM | POA: Diagnosis present

## 2023-04-27 DIAGNOSIS — T17490A Other foreign object in trachea causing asphyxiation, initial encounter: Secondary | ICD-10-CM | POA: Diagnosis not present

## 2023-04-27 DIAGNOSIS — M1990 Unspecified osteoarthritis, unspecified site: Secondary | ICD-10-CM | POA: Diagnosis present

## 2023-04-27 DIAGNOSIS — E871 Hypo-osmolality and hyponatremia: Secondary | ICD-10-CM | POA: Diagnosis not present

## 2023-04-27 DIAGNOSIS — E43 Unspecified severe protein-calorie malnutrition: Secondary | ICD-10-CM | POA: Diagnosis present

## 2023-04-27 DIAGNOSIS — J9601 Acute respiratory failure with hypoxia: Secondary | ICD-10-CM | POA: Diagnosis present

## 2023-04-27 DIAGNOSIS — G629 Polyneuropathy, unspecified: Secondary | ICD-10-CM | POA: Diagnosis present

## 2023-04-27 DIAGNOSIS — K5730 Diverticulosis of large intestine without perforation or abscess without bleeding: Secondary | ICD-10-CM | POA: Diagnosis present

## 2023-04-27 DIAGNOSIS — J1569 Pneumonia due to other gram-negative bacteria: Secondary | ICD-10-CM | POA: Diagnosis not present

## 2023-04-27 DIAGNOSIS — Z6826 Body mass index (BMI) 26.0-26.9, adult: Secondary | ICD-10-CM

## 2023-04-27 DIAGNOSIS — K267 Chronic duodenal ulcer without hemorrhage or perforation: Secondary | ICD-10-CM | POA: Diagnosis present

## 2023-04-27 DIAGNOSIS — I480 Paroxysmal atrial fibrillation: Secondary | ICD-10-CM | POA: Diagnosis present

## 2023-04-27 DIAGNOSIS — G8929 Other chronic pain: Secondary | ICD-10-CM | POA: Diagnosis present

## 2023-04-27 DIAGNOSIS — N281 Cyst of kidney, acquired: Secondary | ICD-10-CM | POA: Diagnosis present

## 2023-04-27 DIAGNOSIS — G2581 Restless legs syndrome: Secondary | ICD-10-CM | POA: Diagnosis present

## 2023-04-27 DIAGNOSIS — F411 Generalized anxiety disorder: Secondary | ICD-10-CM | POA: Diagnosis present

## 2023-04-27 DIAGNOSIS — R7401 Elevation of levels of liver transaminase levels: Secondary | ICD-10-CM | POA: Diagnosis present

## 2023-04-27 MED ORDER — SODIUM CHLORIDE 0.9 % IV SOLN
2.0000 g | Freq: Once | INTRAVENOUS | Status: AC
  Administered 2023-04-28: 2 g via INTRAVENOUS
  Filled 2023-04-27: qty 20

## 2023-04-27 MED ORDER — SODIUM CHLORIDE 0.9 % IV SOLN
500.0000 mg | Freq: Once | INTRAVENOUS | Status: AC
  Administered 2023-04-28: 500 mg via INTRAVENOUS
  Filled 2023-04-27: qty 5

## 2023-04-27 MED ORDER — LACTATED RINGERS IV BOLUS
1000.0000 mL | Freq: Once | INTRAVENOUS | Status: AC
  Administered 2023-04-28: 1000 mL via INTRAVENOUS

## 2023-04-27 MED ORDER — ACETAMINOPHEN 500 MG PO TABS
1000.0000 mg | ORAL_TABLET | Freq: Once | ORAL | Status: AC
  Administered 2023-04-28: 1000 mg via ORAL
  Filled 2023-04-27: qty 2

## 2023-04-27 NOTE — ED Triage Notes (Signed)
Pt coming in for altered mental in which last week the wife reports he had a fever and was sick, and no the past few days he has been sleeping all day, not eating, sweating profusely. Pt presents with tachypnea, unable to answer most orientation questions, can move all extremities and is alert to voice.

## 2023-04-27 NOTE — ED Provider Notes (Signed)
[facility]ERGENCY DEPARTMENT AT [facility] Provider Note   CSN: [id number] Arrival date & time: 04/27/23  2301     History {Add pertinent medical, surgical, social history, OB history to HPI:1} Chief Complaint  Patient presents with   Altered Mental Status    Bradley Morgan is a 72 y.o. male.  72 yo M with a cc of ams.  Patient over the past week has been coughing and congested and over the past few days has been too weak to get out of bed and has not been as active is normal not eating or drinking.  Family states they that he fell earlier this week and they think maybe he broke a rib on the right side.  Patient is confused and unable to provide any history level 5 caveat   Altered Mental Status      Home Medications Prior to Admission medications   Medication Sig Start Date End Date Taking? Authorizing Provider  aspirin EC 81 MG tablet Take 81 mg by mouth daily. Swallow whole.    [provider]  Carboxymethylcellul-Glycerin (LUBRICATING EYE DROPS OP) Place 1 drop into both eyes daily as needed (dry eyes).    [provider]  carvedilol (COREG) 3.125 MG tablet Take 3.125 mg by mouth 2 (two) times daily with a meal.     [provider]  clonazePAM (KLONOPIN) 0.5 MG tablet Take 0.5 mg by mouth 3 (three) times daily as needed for anxiety.    [provider]  cyclobenzaprine (FLEXERIL) 10 MG tablet Take 1 tablet (10 mg total) 3 (three) times daily as needed by mouth for muscle spasms. 08/04/17   [name], [name], PA-C  furosemide (LASIX) 20 MG tablet Take 20 mg by mouth daily as needed (fluid retention). 06/19/18   [provider]  gabapentin (NEURONTIN) 300 MG capsule Take 300 mg by mouth 2 (two) times daily.    [provider]  HYDROcodone-acetaminophen (NORCO) 7.5-325 MG tablet Take 1 tablet by mouth every 6 (six) hours as needed for moderate pain.    [provider]  losartan (COZAAR) 25 MG tablet  Take 1 tablet (25 mg total) by mouth daily. 03/23/23 06/21/23  [name], MD  pantoprazole (PROTONIX) 40 MG tablet Take 40 mg by mouth 2 (two) times daily.    [provider]  rOPINIRole (REQUIP) 1 MG tablet Take 1 mg by mouth at bedtime.    [provider]  rosuvastatin (CRESTOR) 20 MG tablet Take 1 tablet (20 mg total) by mouth daily. 03/23/23   [name], MD  triamcinolone cream (KENALOG) 0.1 % Apply 1 application. topically daily as needed (rash). 04/23/21   [provider]      Allergies    Aspirin and Nsaids    Review of Systems   Review of Systems  Physical Exam Updated Vital Signs BP 99/80   Pulse (!) 140   Temp 99.9 F (37.7 C)   Resp (!) 26   SpO2 90%  Physical Exam Vitals and nursing note reviewed.  Constitutional:      Appearance: He is well-developed.  HENT:     Head: Normocephalic and atraumatic.  Eyes:     Pupils: Pupils are equal, round, and reactive to light.  Neck:     Vascular: No JVD.  Cardiovascular:     Rate and Rhythm: Tachycardia present. Rhythm irregular.     Heart sounds: No murmur heard.    No friction rub. No gallop.  Pulmonary:     Effort: No respiratory distress.     Breath sounds: No wheezing.  Abdominal:     General: There is no distension.     Tenderness: There is no abdominal tenderness. There is no guarding or rebound.  Musculoskeletal:        General: Normal range of motion.     Cervical back: Normal range of motion and neck supple.  Skin:    Coloration: Skin is not pale.     Findings: No rash.  Neurological:     Mental Status: He is alert and oriented to person, place, and time.  Psychiatric:        Behavior: Behavior normal.     ED Results / Procedures / Treatments   Labs (all labs ordered are listed, but only abnormal results are displayed) Labs Reviewed  CULTURE, BLOOD (ROUTINE X 2)  CULTURE, BLOOD (ROUTINE X 2)  RESP PANEL BY RT-PCR (RSV, FLU A&B, COVID)  RVPGX2  COMPREHENSIVE METABOLIC  PANEL  CBC WITH DIFFERENTIAL/PLATELET  PROTIME-INR  URINALYSIS, W/ REFLEX TO CULTURE (INFECTION SUSPECTED)  I-STAT CG4 LACTIC ACID, ED  I-STAT VENOUS BLOOD GAS, ED  I-STAT CREATININE, ED    EKG EKG Interpretation Date/Time:  Wednesday April 27 2023 23:03:15 EDT Ventricular Rate:  139 PR Interval:    QRS Duration:  146 QT Interval:  296 QTC Calculation: 450 R Axis:   65  Text Interpretation: Atrial fibrillation with rapid ventricular response with premature ventricular or aberrantly conducted complexes Left bundle branch block Abnormal ECG Otherwise no significant change Confirmed by Melene Plan [phone]) on 04/27/2023 11:19:47 PM  Radiology No results found.  Procedures Procedures  {Document cardiac monitor, telemetry assessment procedure when appropriate:1}  Medications Ordered in ED Medications  lactated ringers bolus 1,000 mL (has no administration in time range)  acetaminophen (TYLENOL) tablet 1,000 mg (has no administration in time range)    ED Course/ Medical Decision Making/ A&P   {   Click here for ABCD2, HEART and other calculatorsREFRESH Note before signing :1}                              Medical Decision Making Amount and/or Complexity of Data Reviewed Labs: ordered. Radiology: ordered.  Risk OTC drugs.   72 yo M with a chief complaints of cough congestion fever and altered mental status.  Going on for the past week worsening over the past few days.  Patient arrived in atrial fibrillation with RVR with rates into the 150s.  No history of the same on my review.  Will obtain evaluation for possible infectious cause.  COVID test.  Family does think that he fell and maybe struck his head earlier in the week will obtain a CT of the head.  Bolus of IV fluids.  Reassess.   Plain film of the chest independently interpreted by me with right lower lobe infiltrate consistent with pneumonia clinically.  Will start on antibiotics.  {Document critical care time when  appropriate:1} {Document review of labs and clinical decision tools ie heart score, Chads2Vasc2 etc:1}  {Document your independent review of radiology images, and any outside records:1} {Document your discussion with family members, caretakers, and with consultants:1} {Document social determinants of health affecting pt's care:1} {Document your decision making why or why not admission, treatments were needed:1} Final Clinical Impression(s) / ED Diagnoses Final diagnoses:  None    Rx / DC Orders ED Discharge Orders  None

## 2023-04-28 ENCOUNTER — Emergency Department (HOSPITAL_COMMUNITY): Payer: Medicare HMO

## 2023-04-28 ENCOUNTER — Inpatient Hospital Stay (HOSPITAL_COMMUNITY): Payer: Medicare HMO

## 2023-04-28 ENCOUNTER — Other Ambulatory Visit (HOSPITAL_COMMUNITY): Payer: Self-pay

## 2023-04-28 DIAGNOSIS — I498 Other specified cardiac arrhythmias: Secondary | ICD-10-CM

## 2023-04-28 DIAGNOSIS — U071 COVID-19: Secondary | ICD-10-CM | POA: Diagnosis present

## 2023-04-28 DIAGNOSIS — I5023 Acute on chronic systolic (congestive) heart failure: Secondary | ICD-10-CM | POA: Diagnosis not present

## 2023-04-28 DIAGNOSIS — K267 Chronic duodenal ulcer without hemorrhage or perforation: Secondary | ICD-10-CM | POA: Diagnosis present

## 2023-04-28 DIAGNOSIS — J9601 Acute respiratory failure with hypoxia: Secondary | ICD-10-CM | POA: Diagnosis present

## 2023-04-28 DIAGNOSIS — I5022 Chronic systolic (congestive) heart failure: Secondary | ICD-10-CM

## 2023-04-28 DIAGNOSIS — E7800 Pure hypercholesterolemia, unspecified: Secondary | ICD-10-CM

## 2023-04-28 DIAGNOSIS — A409 Streptococcal sepsis, unspecified: Secondary | ICD-10-CM | POA: Diagnosis present

## 2023-04-28 DIAGNOSIS — I472 Ventricular tachycardia, unspecified: Secondary | ICD-10-CM | POA: Diagnosis not present

## 2023-04-28 DIAGNOSIS — J1569 Pneumonia due to other gram-negative bacteria: Secondary | ICD-10-CM | POA: Diagnosis not present

## 2023-04-28 DIAGNOSIS — Z9911 Dependence on respirator [ventilator] status: Secondary | ICD-10-CM | POA: Diagnosis not present

## 2023-04-28 DIAGNOSIS — J13 Pneumonia due to Streptococcus pneumoniae: Secondary | ICD-10-CM | POA: Diagnosis present

## 2023-04-28 DIAGNOSIS — E43 Unspecified severe protein-calorie malnutrition: Secondary | ICD-10-CM | POA: Diagnosis present

## 2023-04-28 DIAGNOSIS — Z515 Encounter for palliative care: Secondary | ICD-10-CM | POA: Diagnosis not present

## 2023-04-28 DIAGNOSIS — E870 Hyperosmolality and hypernatremia: Secondary | ICD-10-CM | POA: Diagnosis not present

## 2023-04-28 DIAGNOSIS — J150 Pneumonia due to Klebsiella pneumoniae: Secondary | ICD-10-CM | POA: Diagnosis not present

## 2023-04-28 DIAGNOSIS — G9341 Metabolic encephalopathy: Secondary | ICD-10-CM | POA: Diagnosis present

## 2023-04-28 DIAGNOSIS — D649 Anemia, unspecified: Secondary | ICD-10-CM | POA: Diagnosis present

## 2023-04-28 DIAGNOSIS — Y95 Nosocomial condition: Secondary | ICD-10-CM | POA: Diagnosis not present

## 2023-04-28 DIAGNOSIS — J1282 Pneumonia due to coronavirus disease 2019: Secondary | ICD-10-CM | POA: Diagnosis present

## 2023-04-28 DIAGNOSIS — Z66 Do not resuscitate: Secondary | ICD-10-CM | POA: Diagnosis not present

## 2023-04-28 DIAGNOSIS — I130 Hypertensive heart and chronic kidney disease with heart failure and stage 1 through stage 4 chronic kidney disease, or unspecified chronic kidney disease: Secondary | ICD-10-CM | POA: Diagnosis present

## 2023-04-28 DIAGNOSIS — A419 Sepsis, unspecified organism: Secondary | ICD-10-CM

## 2023-04-28 DIAGNOSIS — Y92009 Unspecified place in unspecified non-institutional (private) residence as the place of occurrence of the external cause: Secondary | ICD-10-CM | POA: Diagnosis not present

## 2023-04-28 DIAGNOSIS — J189 Pneumonia, unspecified organism: Secondary | ICD-10-CM | POA: Diagnosis present

## 2023-04-28 DIAGNOSIS — N179 Acute kidney failure, unspecified: Secondary | ICD-10-CM | POA: Diagnosis present

## 2023-04-28 DIAGNOSIS — E872 Acidosis, unspecified: Secondary | ICD-10-CM | POA: Diagnosis present

## 2023-04-28 DIAGNOSIS — I4891 Unspecified atrial fibrillation: Secondary | ICD-10-CM | POA: Diagnosis not present

## 2023-04-28 DIAGNOSIS — F05 Delirium due to known physiological condition: Secondary | ICD-10-CM | POA: Diagnosis present

## 2023-04-28 DIAGNOSIS — N1831 Chronic kidney disease, stage 3a: Secondary | ICD-10-CM | POA: Diagnosis present

## 2023-04-28 DIAGNOSIS — I10 Essential (primary) hypertension: Secondary | ICD-10-CM

## 2023-04-28 DIAGNOSIS — Q600 Renal agenesis, unilateral: Secondary | ICD-10-CM | POA: Diagnosis not present

## 2023-04-28 DIAGNOSIS — I480 Paroxysmal atrial fibrillation: Secondary | ICD-10-CM | POA: Diagnosis present

## 2023-04-28 DIAGNOSIS — W19XXXA Unspecified fall, initial encounter: Secondary | ICD-10-CM | POA: Diagnosis present

## 2023-04-28 DIAGNOSIS — M6282 Rhabdomyolysis: Secondary | ICD-10-CM | POA: Diagnosis not present

## 2023-04-28 DIAGNOSIS — G2581 Restless legs syndrome: Secondary | ICD-10-CM | POA: Diagnosis present

## 2023-04-28 DIAGNOSIS — J9600 Acute respiratory failure, unspecified whether with hypoxia or hypercapnia: Secondary | ICD-10-CM | POA: Diagnosis not present

## 2023-04-28 LAB — COMPREHENSIVE METABOLIC PANEL
ALT: 31 U/L (ref 0–44)
AST: 75 U/L — ABNORMAL HIGH (ref 15–41)
Albumin: 2.6 g/dL — ABNORMAL LOW (ref 3.5–5.0)
Alkaline Phosphatase: 89 U/L (ref 38–126)
Anion gap: 19 — ABNORMAL HIGH (ref 5–15)
BUN: 80 mg/dL — ABNORMAL HIGH (ref 8–23)
CO2: 18 mmol/L — ABNORMAL LOW (ref 22–32)
Calcium: 8.6 mg/dL — ABNORMAL LOW (ref 8.9–10.3)
Chloride: 101 mmol/L (ref 98–111)
Creatinine, Ser: 5.76 mg/dL — ABNORMAL HIGH (ref 0.61–1.24)
GFR, Estimated: 10 mL/min — ABNORMAL LOW (ref >60)
Glucose, Bld: 110 mg/dL — ABNORMAL HIGH (ref 70–99)
Potassium: 4.1 mmol/L (ref 3.5–5.1)
Sodium: 138 mmol/L (ref 135–145)
Total Bilirubin: 0.6 mg/dL (ref 0.3–1.2)
Total Protein: 6 g/dL — ABNORMAL LOW (ref 6.5–8.1)

## 2023-04-28 LAB — CBC WITH DIFFERENTIAL/PLATELET
Abs Immature Granulocytes: 0.42 10*3/uL — ABNORMAL HIGH (ref 0.00–0.07)
Basophils Absolute: 0 10*3/uL (ref 0.0–0.1)
Basophils Relative: 0 %
Eosinophils Absolute: 0 10*3/uL (ref 0.0–0.5)
Eosinophils Relative: 0 %
HCT: 37.6 % — ABNORMAL LOW (ref 39.0–52.0)
Hemoglobin: 12.4 g/dL — ABNORMAL LOW (ref 13.0–17.0)
Immature Granulocytes: 2 %
Lymphocytes Relative: 2 %
Lymphs Abs: 0.3 10*3/uL — ABNORMAL LOW (ref 0.7–4.0)
MCH: 29.2 pg (ref 26.0–34.0)
MCHC: 33 g/dL (ref 30.0–36.0)
MCV: 88.7 fL (ref 80.0–100.0)
Monocytes Absolute: 0.8 10*3/uL (ref 0.1–1.0)
Monocytes Relative: 4 %
Neutro Abs: 17.4 10*3/uL — ABNORMAL HIGH (ref 1.7–7.7)
Neutrophils Relative %: 92 %
Platelets: 187 10*3/uL (ref 150–400)
RBC: 4.24 MIL/uL (ref 4.22–5.81)
RDW: 14.9 % (ref 11.5–15.5)
WBC: 18.9 10*3/uL — ABNORMAL HIGH (ref 4.0–10.5)
nRBC: 0 % (ref 0.0–0.2)

## 2023-04-28 LAB — HEPARIN LEVEL (UNFRACTIONATED)
Heparin Unfractionated: <0.1 IU/mL — ABNORMAL LOW (ref 0.30–0.70)
Heparin Unfractionated: 0.2 IU/mL — ABNORMAL LOW (ref 0.30–0.70)

## 2023-04-28 LAB — ECHOCARDIOGRAM COMPLETE
Height: 73 in
Weight: 3160.51 oz

## 2023-04-28 LAB — MAGNESIUM: Magnesium: 2.1 mg/dL (ref 1.7–2.4)

## 2023-04-28 LAB — I-STAT CG4 LACTIC ACID, ED: Lactic Acid, Venous: 1.9 mmol/L (ref 0.5–1.9)

## 2023-04-28 LAB — HIV ANTIBODY (ROUTINE TESTING W REFLEX): HIV Screen 4th Generation wRfx: NONREACTIVE

## 2023-04-28 LAB — PHOSPHORUS: Phosphorus: 6.7 mg/dL — ABNORMAL HIGH (ref 2.5–4.6)

## 2023-04-28 LAB — STREP PNEUMONIAE URINARY ANTIGEN: Strep Pneumo Urinary Antigen: POSITIVE — AB

## 2023-04-28 LAB — CK: Total CK: 4057 U/L — ABNORMAL HIGH (ref 49–397)

## 2023-04-28 MED ORDER — PANTOPRAZOLE SODIUM 40 MG PO TBEC
40.0000 mg | DELAYED_RELEASE_TABLET | Freq: Two times a day (BID) | ORAL | Status: DC
  Administered 2023-04-28 – 2023-04-30 (×5): 40 mg via ORAL
  Filled 2023-04-28 (×6): qty 1

## 2023-04-28 MED ORDER — HEPARIN (PORCINE) 25000 UT/250ML-% IV SOLN
1600.0000 [IU]/h | INTRAVENOUS | Status: DC
  Administered 2023-04-28: 1400 [IU]/h via INTRAVENOUS
  Administered 2023-04-28: 1600 [IU]/h via INTRAVENOUS
  Filled 2023-04-28 (×2): qty 250

## 2023-04-28 MED ORDER — HYDROCODONE-ACETAMINOPHEN 5-325 MG PO TABS
1.0000 | ORAL_TABLET | Freq: Four times a day (QID) | ORAL | Status: DC | PRN
  Administered 2023-04-28: 1 via ORAL
  Filled 2023-04-28: qty 1

## 2023-04-28 MED ORDER — LEVALBUTEROL HCL 1.25 MG/0.5ML IN NEBU: 1.2500 mg | INHALATION_SOLUTION | RESPIRATORY_TRACT | Status: DC | PRN

## 2023-04-28 MED ORDER — DILTIAZEM HCL-DEXTROSE 125-5 MG/125ML-% IV SOLN (PREMIX)
5.0000 mg/h | INTRAVENOUS | Status: DC
  Administered 2023-04-28: 5 mg/h via INTRAVENOUS
  Administered 2023-04-29: 15 mg/h via INTRAVENOUS
  Filled 2023-04-28 (×3): qty 125

## 2023-04-28 MED ORDER — SODIUM CHLORIDE 0.9 % IV SOLN
2.0000 g | INTRAVENOUS | Status: AC
  Administered 2023-04-28 – 2023-05-02 (×5): 2 g via INTRAVENOUS
  Filled 2023-04-28 (×5): qty 20

## 2023-04-28 MED ORDER — ASPIRIN 81 MG PO TBEC
81.0000 mg | DELAYED_RELEASE_TABLET | Freq: Every day | ORAL | Status: DC
  Administered 2023-04-28 – 2023-04-29 (×2): 81 mg via ORAL
  Filled 2023-04-28 (×2): qty 1

## 2023-04-28 MED ORDER — DEXAMETHASONE 4 MG PO TABS
6.0000 mg | ORAL_TABLET | Freq: Every day | ORAL | Status: DC
  Administered 2023-04-28 – 2023-04-30 (×3): 6 mg via ORAL
  Filled 2023-04-28 (×3): qty 2

## 2023-04-28 MED ORDER — ROSUVASTATIN CALCIUM 20 MG PO TABS
20.0000 mg | ORAL_TABLET | Freq: Every day | ORAL | Status: DC
  Administered 2023-04-28: 20 mg via ORAL
  Filled 2023-04-28: qty 1

## 2023-04-28 MED ORDER — SODIUM CHLORIDE 0.9 % IV SOLN
INTRAVENOUS | Status: AC
  Administered 2023-04-28: 1000 mL via INTRAVENOUS

## 2023-04-28 MED ORDER — CLONAZEPAM 0.5 MG PO TABS
0.5000 mg | ORAL_TABLET | Freq: Three times a day (TID) | ORAL | Status: DC | PRN
  Administered 2023-04-30: 0.5 mg via ORAL
  Filled 2023-04-28: qty 1

## 2023-04-28 MED ORDER — LACTATED RINGERS IV BOLUS
1000.0000 mL | Freq: Once | INTRAVENOUS | Status: AC
  Administered 2023-04-28: 1000 mL via INTRAVENOUS

## 2023-04-28 MED ORDER — HEPARIN (PORCINE) 25000 UT/250ML-% IV SOLN
2000.0000 [IU]/h | INTRAVENOUS | Status: DC
  Administered 2023-04-29: 1800 [IU]/h via INTRAVENOUS
  Filled 2023-04-28: qty 250

## 2023-04-28 MED ORDER — GABAPENTIN 100 MG PO CAPS
100.0000 mg | ORAL_CAPSULE | Freq: Two times a day (BID) | ORAL | Status: DC
  Administered 2023-04-28 – 2023-04-30 (×6): 100 mg via ORAL
  Filled 2023-04-28 (×6): qty 1

## 2023-04-28 MED ORDER — CARVEDILOL 6.25 MG PO TABS
6.2500 mg | ORAL_TABLET | Freq: Two times a day (BID) | ORAL | Status: DC
  Administered 2023-04-28 – 2023-04-29 (×3): 6.25 mg via ORAL
  Filled 2023-04-28: qty 2
  Filled 2023-04-28 (×3): qty 1

## 2023-04-28 MED ORDER — HYDROCODONE-ACETAMINOPHEN 5-325 MG PO TABS
1.0000 | ORAL_TABLET | Freq: Once | ORAL | Status: AC | PRN
  Administered 2023-04-28: 1 via ORAL
  Filled 2023-04-28: qty 1

## 2023-04-28 MED ORDER — ACETAMINOPHEN 325 MG PO TABS: 650.0000 mg | ORAL_TABLET | Freq: Three times a day (TID) | ORAL | Status: DC | PRN

## 2023-04-28 MED ORDER — LEVALBUTEROL HCL 0.63 MG/3ML IN NEBU
0.6300 mg | INHALATION_SOLUTION | Freq: Four times a day (QID) | RESPIRATORY_TRACT | Status: DC
  Administered 2023-04-28 – 2023-05-01 (×13): 0.63 mg via RESPIRATORY_TRACT
  Filled 2023-04-28 (×12): qty 3

## 2023-04-28 MED ORDER — CARVEDILOL 3.125 MG PO TABS: 3.1250 mg | ORAL_TABLET | Freq: Two times a day (BID) | ORAL | Status: DC

## 2023-04-28 MED ORDER — HEPARIN BOLUS VIA INFUSION
2000.0000 [IU] | Freq: Once | INTRAVENOUS | Status: AC
  Administered 2023-04-28: 2000 [IU] via INTRAVENOUS
  Filled 2023-04-28: qty 2000

## 2023-04-28 MED ORDER — ROPINIROLE HCL 1 MG PO TABS
1.0000 mg | ORAL_TABLET | Freq: Every day | ORAL | Status: DC
  Administered 2023-04-28 – 2023-04-29 (×2): 1 mg via ORAL
  Filled 2023-04-28 (×3): qty 1

## 2023-04-28 MED ORDER — SODIUM CHLORIDE 0.9 % IV SOLN: 500.0000 mg | INTRAVENOUS | Status: DC

## 2023-04-28 MED ORDER — HEPARIN BOLUS VIA INFUSION
4000.0000 [IU] | Freq: Once | INTRAVENOUS | Status: AC
  Administered 2023-04-28: 4000 [IU] via INTRAVENOUS
  Filled 2023-04-28: qty 4000

## 2023-04-28 NOTE — Progress Notes (Signed)
ANTICOAGULATION CONSULT NOTE  Pharmacy Consult for Heparin  Indication: atrial fibrillation  Allergies  Allergen Reactions   Aspirin Other (See Comments)    Stomach ulcers. No high doses   Nsaids Other (See Comments)    CAUSES STOMACH ULCERS     Patient Measurements: Height: 6\' 1"  (185.4 cm) Weight: 89.6 kg (197 lb 8.5 oz) IBW/kg (Calculated) : 79.9 Heparin weight: 89.6 kg  Vital Signs: Temp: 98.5 F (36.9 C) (08/08 1112) Temp Source: Oral (08/08 1112) BP: 107/81 (08/08 1100) Pulse Rate: 99 (08/08 1100)  Labs: Recent Labs    04/27/23 0020 04/28/23 0031 04/28/23 0032 04/28/23 0454 04/28/23 1102  HGB 14.0 14.3  --  12.4*  --   HCT 41.4 42.0  --  37.6*  --   PLT 237  --   --  187  --   LABPROT 19.6*  --   --   --   --   INR 1.6*  --   --   --   --   HEPARINUNFRC  --   --   --   --  <0.10*  CREATININE 5.87*  --  6.60* 5.76*  --     Estimated Creatinine Clearance: 13.5 mL/min (A) (by C-G formula based on SCr of 5.76 mg/dL (H)).   Medical History: Past Medical History:  Diagnosis Date   Adrenal adenoma, left    Arthritis    Bursitis of elbow    Complication of anesthesia    ANESTHESIA DID NOT TAKE EFFECT WITH BACK SURGERY "I FELT THEM CUT"   Congenital absence of right kidney    Coronary artery disease    Diverticulosis of colon    Family history of anesthesia complication    "hard time waking up my dad" many yrs ago   Frequency of urination    Full dentures    GAD (generalized anxiety disorder)    GERD (gastroesophageal reflux disease)    History of acute renal failure    12-16-2013;  09-24-2017 due to stone obstruction   History of duodenal ulcer 04/2000   w/ gi bleed post egd w/ cauterization (caused by chronic nsaid use)   History of encephalopathy 04/26/2014   ACUTE EPISODE SECONDARY TO DRUG OVERDOSE (NARC, BZD, FLEXERIL)   History of GI bleed    08/ 2001   duodenal ulcer bleed post egd w/ cauterization   History of kidney stones    History of  prostatitis    Hyperlipidemia    Hypertension    Left bundle branch block 05/05/2021   Left ureteral stone    Nocturia more than twice per night    Peripheral neuropathy    bilateral leg numbness due to back issues, walks w/ cane   Pneumonia    PONV (postoperative nausea and vomiting)    Restless leg syndrome    Solitary left kidney    Wears glasses     Assessment: 72 y/o M with COVID-19, acute on chronic kidney failure, and new onset afib. No anticoagulation prior to admission. Pharmacy consulted for heparin dosing.   HL <0.10, subtherapeutic Heparin gtt running appropriate in room with no pauses charted or noted by nurse.   Goal of Therapy:  Heparin level 0.3-0.7 units/ml Monitor platelets by anticoagulation protocol: Yes   Plan:  Heparin 2000 unit bolus Increase heparin drip to 1600 units/hr 8h heparin level  Daily CBC/Heparin level Monitor for bleeding  Thank you for allowing pharmacy to participate in this patient's care.  Marja Kays, PharmD 04/28/2023,12:29  PM

## 2023-04-28 NOTE — TOC Benefit Eligibility Note (Signed)
Patient Product/process development scientist completed.    The patient is insured through [employer]. Patient has Medicare and is not eligible for a copay card, but may be able to apply for patient assistance, if available.    Ran test claim for Eliquis 5 mg and the current 30 day co-pay is $45.00.  Ran test claim for Xarelto 20 mg and the current 30 day co-pay is $45.0  This test claim was processed through [employer]- copay amounts may vary at other pharmacies due to [employer], or as the patient moves through the different stages of their insurance plan.     Roland Earl, CPHT Pharmacy Patient Advocate Specialist [facility] [place] [facility] Patient Advocate Team Direct Number: [phone]  Fax: [phone]

## 2023-04-28 NOTE — Progress Notes (Signed)
Patient seen and examined personally, I reviewed the chart, history and physical and admission note, done by admitting physician this morning and agree with the same with following addendum.  Please refer to the morning admission note for more detailed plan of care.  Briefly,  72 y.o.m w/ chronic congestive heart failure with moderately reduced ejection fraction (EF 45%), hypertension, hyperlipidemia, CKD IIIa with solitary left kidney (baseline creatinine 1.2-1.6), chronic left bundle branch block, GADr who was brought to hospital with complaints of altered mental status. Reportedly patient has been ill for 7 days.He gradually developed progressive fatigue and weakness, fever, poor appetite, increased lethargy, diaphoresis and ongoing dry, nonproductive cough and shortness of breath and in the past 24 hours patient's mental status has deteriorated, became increasingly confused.  In the ZO:XWRUEAVW with temperature 99.9 F,Blood pressure was stable 140/83, patient was tachycardic, heart rate up to 140/min, with telemetry showing atrial fibrillation,tachypneic, 25/min,hypoxic, saturation 90 to 93% on 3 L oxygen. VBG showed pH 7.32 with pCO2 32 and pO2 50. Labs >leukocytosis 22.6, no anemia, hemoglobin 14.3.Marland Kitchen worsening renal function BUN 78 and creatinine of 6.6 with lactic acidosis 3.2.Patient tested positive for COVID-19 CXR>large large consolidations in the right middle and right lower lobe. CT head showed no acute intracranial abnormalities. Urine strep pna ag+ He was started on ceftriaxone and azithromycin,given IV fluids,heparin infusion and admitted for further management     Since admission he remains tachycardic, confused Labs showed creatinine slightly downtrending 5.7 BUN 18 bicarb 18 mild transaminitis leukocytosis of 13.9 and lactic acid is improved to 1.9. Hemoglobin is 6-10 negative bili and nitrate Renal ultrasound nonvisualization of right kidney, 1.6 X1.2X 1.3 cm simple left renal  cyst echogenic debris's in the urinary bladder.  This morning nursing reports that he remains confused but is much more alert awake wanting to eat/drink He is on 2 L nasal cannula he is alert awake oriented to self current place month president Reports he started with cold symptoms a week ago Able to move all his extremities No specific complaints Feels hungry --- A./P Severe sepsis due to pneumonia POA-when she was physically active with acute encephalopathy, lactic acidosis/PNA Acute hypoxic resp failure from PNA Acute metabolic encephalopathy: Right ML RLL pneumonia from Strep pna-with fever and lactic acidosis and leukocytosis  COVID-19 positive:  AKI on CKD 3A baseline creatinine 1.2-1.6 Metabolic acidosis from lactic acidosis and AKI  PAF with RVR Chronic LBBB Hypertension  Hyperlipidemia Mild transaminitis  RLS:  GERD:  Plan: Continue ceftriaxone azithromycin to cover bacterial pneumonia. Fu blood culture On heparin drip for A-fib RVR and on Coreg.  Continue IV fluid hydration for AKI and c/s nephro-hold lasix and losartan. Will c/s nephrology. Patient remains seriously ill at risk of decompensation For COVID-19 infection with hypoxia continue Decadron.Remdesivir not started given onset > a week and presentation consistent with bacterial pneumonia-continue airborne/contact isolation x 10 days, discussed with ID  llow for oral intake once more alert and communicative, and if passes bedside swallow eval. I called and updated his wife Harriett Sine. GOC-Cont full code.

## 2023-04-28 NOTE — Progress Notes (Addendum)
TR[facility] night cross cover note:   I was notified by RN that this patient continues to have a red MEWS score, similar to day shift, on the basis of tachycardia and tachypnea.   Admitted earlier this morning with sepsis due to right-sided community-acquired pneumonia with  positive strep pneumoniae urine antigen complicated by acute hypoxic respiratory failure, paroxysmal atrial fibrillation with RVR, will's positive COVID-19 finding.  Rapid response was called during the late afternoon for the patient's increased work of breathing.  At that time, he was noted to be in atrial fibrillation with heart rates in the 120s, respiratory rate 26, and oxygen saturation in the low 90s on 6 L nasal cannula.  PCCM was consulted at that time, with recommendations that included de-escalation of antibiotic coverage from Rocephin plus azithromycin to Rocephin as monotherapy given the positive strep pneumonia urine antigen finding.  Additionally, PCCM recommended scheduled Xopenex nebulizers, which has been ordered as well as diltiazem drip for the patient's atrial fibrillation with RVR.  Additionally, PCCM felt that the patient is clinically dry, recommending initiation of IV fluids, following which she has been started on normal saline at 75 cc/h. CXR performed at that time showed no evidence of edema or pneumothorax.  Tonight, the patient is reported to alert and oriented x 4, with blood pressure 117/83, variable HR in the setting of atrial fibrillation HR's in the low 100's to 120's, currently on diltiazem drip at 7.5 mg/h.  Is also noted to be on heparin drip in the setting of atrial fibrillation. RR in the mid 20's, and oxygen saturation remains in the low 90s on 6 L nasal cannula, similar to earlier this afternoon.  Respiratory therapy, in the setting of the patient's persistent tachypnea, recommends initiation of BiPAP, which I subsequently ordered for him.  I have also ordered a serum magnesium and phosphorus  level.  Additionally, the patient continues to complain of back and neck discomfort without any associated neck stiffness reported.  He notes that a slightly refractory nature to this discomfort following dose of Norco a few hours ago.  I subsequently ordered an additional dose of Norco 5/325 mg p.o. x 1 dose now, while trying to refrain from aggressive IV opioid analgesia so as to not diminish respiratory drive at this time.   Also, now that patient is on BiPAP, RT also requests ABG, which I have ordered.      Newton Pigg, DO Hospitalist

## 2023-04-28 NOTE — Consult Note (Signed)
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  He has a baseline creatinine ranging 1.2-1.6; straddling chronic kidney disease stage II-IIIa.  Brought to the emergency room yesterday with altered mental status after a 7-day history of preceding fatigue, weakness, myalgias, diaphoresis and poor appetite in the setting of a dry nonproductive cough and increasing shortness of breath.  In the emergency room found to have hypoxia, tachypnea and leukocytosis with chest x-ray showing RML/RLL consolidation.  COVID-positive as well as positive streptococcal pneumonia antigen in urine.  Concern raised with his renal function; admission creatinine was elevated at 5.87 and has not improved with intravenous fluids overnight.  Renal ultrasound negative for any obstruction.  Urinalysis shows dipstick proteinuria and hemoglobinuria with microscopy showing 0-5 RBC/hpf.  Medications reviewed prior to admission indicates he was taking losartan, simvastatin, gabapentin and cyclobenzaprine.  Additional review of history indicates that he suffered acute kidney injury in 2015 (prerenal) and 2019 (obstructive nephrolithiasis).  When seen, he remains confused with minimal contribution to above history  Past Medical History:  Diagnosis Date   Adrenal adenoma, left    Arthritis    Bursitis of elbow    Complication of anesthesia    ANESTHESIA DID NOT TAKE EFFECT WITH BACK SURGERY "I FELT THEM CUT"   Congenital absence of right kidney    Coronary artery disease    Diverticulosis of colon    Family history of anesthesia complication    "hard time waking up my dad" many yrs ago    Frequency of urination    Full dentures    GAD (generalized anxiety disorder)    GERD (gastroesophageal reflux disease)    History of acute renal failure    12-16-2013;  09-24-2017 due to stone obstruction   History of duodenal ulcer 04/2000   w/ gi bleed post egd w/ cauterization (caused by chronic nsaid use)   History of encephalopathy 04/26/2014   ACUTE EPISODE SECONDARY TO DRUG OVERDOSE (NARC, BZD, FLEXERIL)   History of GI bleed    08/ 2001   duodenal ulcer bleed post egd w/ cauterization   History of kidney stones    History of prostatitis    Hyperlipidemia    Hypertension    Left bundle branch block 05/05/2021   Left ureteral stone    Nocturia more than twice per night    Peripheral neuropathy    bilateral leg numbness due to back issues, walks w/ cane   Pneumonia    PONV (postoperative nausea and vomiting)    Restless leg syndrome    Solitary left kidney    Wears glasses     Past Surgical History:  Procedure Laterality Date   ANTERIOR CERVICAL DECOMP/DISCECTOMY FUSION  05-21-2009   dr Marnee [facility]   C5-6, C6-7 and aviator plate   CARDIOVASCULAR STRESS TEST  12/17/2013   FIXED DEFECT IN THE INFEROSEPTAL REGION BUT NO STRESS INDUCED ISCHEMIA/  NORMAL LV FUNCTION AND WALL MOTION , EF 63%   CLOSED MANIPULATION POST TOTAL KNEE ARTHROPLASTY Left 11-28-2001    dr Lequita Halt   CYSTOSCOPY WITH RETROGRADE PYELOGRAM, URETEROSCOPY AND STENT PLACEMENT Left 10/07/2017   Procedure: CYSTOSCOPY WITH LEFT RETROGRADE PYELOGRAM, BASKETRY OF STONE, URETEROSCOPY AND STENT REMOVAL;  Surgeon: Berneice Heinrich,  Normand Sloop, MD;  Location: [place];  Service: Urology;  Laterality: Left;   CYSTOSCOPY WITH STENT PLACEMENT Left 09/24/2017   Procedure: RENAL CYSTOSCOPY LEFT RETROGRADE, LEFT URETERAL STENT PLACEMENT;  Surgeon: Sebastian Ache, MD;  Location: [facility] OR;  Service: Urology;  Laterality: Left;   HIP ARTHROSCOPY W/ LABRAL DEBRIDEMENT Left 08/12/2006   dr Lequita Halt [facility]   and chondraplasty    INCISION AND DRAINAGE ABSCESS Right 03/06/2015   Procedure: INCISION AND DRAINAGE ABSCESS RIGHT ARM;  Surgeon: Cindee Salt, MD;  Location: [facility];  Service: Orthopedics;  Laterality: Right;   INGUINAL HERNIA REPAIR Right 01/25/2022   Procedure: OPEN RIGHT INGUINAL HERNIA REPAIR WITH MESH;  Surgeon: Violeta Gelinas, MD;  Location: [facility] OR;  Service: General;  Laterality: Right;   INSERTION OF MESH N/A 08/03/2021   Procedure: INSERTION OF MESH;  Surgeon: Violeta Gelinas, MD;  Location: [facility] OR;  Service: General;  Laterality: N/A;   INSERTION OF MESH Right 01/25/2022   Procedure: INSERTION OF MESH;  Surgeon: Violeta Gelinas, MD;  Location: [facility] OR;  Service: General;  Laterality: Right;   KNEE ARTHROSCOPY Left x2  2002   LUMBAR SPINE SURGERY  x5  last one 2002   SHOULDER ARTHROSCOPY WITH SUBACROMIAL DECOMPRESSION, ROTATOR CUFF REPAIR AND BICEP TENDON REPAIR Right 06-27-2002  dr Lequita Halt [facility]   labral debridement and DCR   SHOULDER OPEN ROTATOR CUFF REPAIR Left 07/11/2014   Procedure: ROTATOR CUFF REPAIR SHOULDER OPEN WITH  GRAFT ;  Surgeon: Jacki Cones, MD;  Location: [facility];  Service: Orthopedics;  Laterality: Left;   SUPRA-UMBILICAL HERNIA N/A 08/03/2021   Procedure: SUPRA-UMBILICAL HERNIA REPAIR;  Surgeon: Violeta Gelinas, MD;  Location: [facility] OR;  Service: General;  Laterality: N/A;   TOTAL HIP ARTHROPLASTY Left 10/02/2018   Procedure: TOTAL HIP ARTHROPLASTY ANTERIOR APPROACH;  Surgeon: Ollen Gross, MD;  Location: [facility];  Service: Orthopedics;  Laterality: Left;    TOTAL HIP ARTHROPLASTY Right 07/30/2019   Procedure: TOTAL HIP ARTHROPLASTY ANTERIOR APPROACH;  Surgeon: Ollen Gross, MD;  Location: [facility];  Service: Orthopedics;  Laterality: Right;    TOTAL KNEE ARTHROPLASTY Left 10-09-2001   dr Lequita Halt [facility]   TOTAL KNEE REVISION Left 08/03/2017   Procedure: Left knee tibia polyethylene and patella revision;  Surgeon: Ollen Gross, MD;  Location: [facility];  Service:  Orthopedics;  Laterality: Left;  with abductor block    Family History  Problem Relation Age of Onset   Heart failure Father    Diabetes Father    Hypertension Father    Anxiety disorder Mother        also tended to overdose on anxiety medication    Social History:  reports that he quit smoking about 24 years ago. His smoking use included cigarettes. He started smoking about 39 years ago. He has never used smokeless tobacco. He reports that he does not drink alcohol and does not use drugs.  Allergies:  Allergies  Allergen Reactions   Aspirin Other (See Comments)    Stomach ulcers. No high doses   Nsaids Other (See Comments)    CAUSES STOMACH ULCERS     Medications: I have reviewed the patient's current medications. Scheduled:  aspirin EC  81 mg Oral Daily   carvedilol  6.25 mg Oral BID WC   dexamethasone  6 mg Oral Daily   gabapentin  100 mg Oral BID   pantoprazole  40 mg Oral BID   rOPINIRole  1 mg Oral QHS  rosuvastatin  20 mg Oral Daily   Continuous:  sodium chloride 75 mL/hr at 04/28/23 1207   azithromycin     cefTRIAXone (ROCEPHIN)  IV     heparin 1,600 Units/hr (04/28/23 1237)       Latest Ref Rng & Units 04/28/2023    4:54 AM 04/28/2023   12:32 AM 04/28/2023   12:31 AM  BMP  Glucose 70 - 99 mg/dL 161     BUN 8 - 23 mg/dL 80     Creatinine 0.96 - 1.24 mg/dL 0.45  4.09    Sodium 811 - 145 mmol/L 138   137   Potassium 3.5 - 5.1 mmol/L 4.1   3.6   Chloride 98 - 111 mmol/L 101     CO2 22 - 32 mmol/L 18     Calcium 8.9 - 10.3 mg/dL 8.6         Latest Ref Rng & Units 04/28/2023    4:54 AM 04/28/2023   12:31 AM 04/27/2023   12:20 AM  CBC  WBC 4.0 - 10.5 K/uL 18.9   22.6   Hemoglobin 13.0 - 17.0 g/dL 91.4  78.2  95.6   Hematocrit 39.0 - 52.0 % 37.6  42.0  41.4   Platelets 150 - 400 K/uL 187   237    Urinalysis    Component Value Date/Time   COLORURINE AMBER (A) 04/28/2023 0616   APPEARANCEUR HAZY (A) 04/28/2023 0616   LABSPEC 1.016 04/28/2023 0616   PHURINE  5.0 04/28/2023 0616   GLUCOSEU NEGATIVE 04/28/2023 0616   HGBUR LARGE (A) 04/28/2023 0616   BILIRUBINUR NEGATIVE 04/28/2023 0616   KETONESUR NEGATIVE 04/28/2023 0616   PROTEINUR 100 (A) 04/28/2023 0616   UROBILINOGEN 0.2 05/02/2015 1120   NITRITE NEGATIVE 04/28/2023 0616   LEUKOCYTESUR NEGATIVE 04/28/2023 0616       US RENAL  Result Date: 04/28/2023 CLINICAL DATA:  Acute kidney injury. EXAM: RENAL / URINARY TRACT ULTRASOUND COMPLETE COMPARISON:  None Available. FINDINGS: Right Kidney: The right kidney is not visualized. Left Kidney: Renal measurements: 11.2 cm x 5.7 cm x 7.4 cm = volume: 245.4 mL. Echogenicity within normal limits. A 1.6 cm x 1.2 cm x 1.3 cm simple cyst is seen within the left kidney. No abnormal flow is noted within this region on color Doppler evaluation. No hydronephrosis is visualized. Bladder: A mild amount of echogenic debris is seen within the dependent portion of the gallbladder lumen. Other: None. IMPRESSION: 1. Nonvisualization of the right kidney. 2. 1.6 cm x 1.2 cm x 1.3 cm simple left renal cyst. 3. Mild amount of echogenic debris within the urinary bladder. A small amount of blood products cannot be excluded. Correlation with urinalysis is recommended. Electronically Signed   By: Aram Candela M.D.   On: 04/28/2023 03:16   CT Head Wo Contrast  Result Date: 04/27/2023 CLINICAL DATA:  Altered mental status. EXAM: CT HEAD WITHOUT CONTRAST TECHNIQUE: Contiguous axial images were obtained from the base of the skull through the vertex without intravenous contrast. RADIATION DOSE REDUCTION: This exam was performed according to the departmental dose-optimization program which includes automated exposure control, adjustment of the mA and/or kV according to patient size and/or use of iterative reconstruction technique. COMPARISON:  April 26, 2014 FINDINGS: Brain: There is mild cerebral atrophy with widening of the extra-axial spaces and ventricular dilatation. There are areas  of decreased attenuation within the white matter tracts of the supratentorial brain, consistent with microvascular disease changes. Vascular: No hyperdense vessel or unexpected calcification. Skull:  Normal. Negative for fracture or focal lesion. Sinuses/Orbits: There is mild left maxillary sinus mucosal thickening. Other: None. IMPRESSION: 1. Generalized cerebral atrophy with mild, chronic white matter small vessel ischemic changes. 2. No acute intracranial abnormality. 3. Mild left maxillary sinus disease. Electronically Signed   By: Aram Candela M.D.   On: 04/27/2023 23:58   DG Chest 2 View  Result Date: 04/27/2023 CLINICAL DATA:  Suspected sepsis. EXAM: CHEST - 2 VIEW COMPARISON:  Chest radiograph 10/04/2014. FINDINGS: Dense consolidation in the right lung base likely involving both right middle and lower lobes. Suspected air bronchograms. Mild peribronchial thickening. Streaky atelectasis at the left lung base. Grossly stable heart size and mediastinal contours. No large pleural effusion. No pneumothorax. Cervical spine hardware is partially included. IMPRESSION: Dense consolidation in the right lung base likely involving both right middle and lower lobes, suspicious for pneumonia. Followup PA and lateral chest X-ray is recommended in 3-4 weeks following trial of antibiotic therapy to ensure resolution and exclude underlying malignancy. Electronically Signed   By: Narda Rutherford M.D.   On: 04/27/2023 23:45    Review of Systems  Unable to perform ROS: Mental status change   Blood pressure 107/81, pulse 99, temperature 98.5 F (36.9 C), temperature source Oral, resp. rate (!) 29, height 6\' 1"  (1.854 m), weight 89.6 kg, SpO2 94%. Physical Exam Vitals and nursing note reviewed.  Constitutional:      Appearance: He is normal weight. He is ill-appearing. He is not toxic-appearing.     Comments: Unkempt and appears lethargic/somnolent  HENT:     Head: Normocephalic and atraumatic.     Right Ear:  Tympanic membrane normal.     Left Ear: Tympanic membrane normal.     Nose: Nose normal.     Mouth/Throat:     Mouth: Mucous membranes are dry.     Pharynx: Oropharynx is clear.  Eyes:     General: No scleral icterus.    Extraocular Movements: Extraocular movements intact.     Conjunctiva/sclera: Conjunctivae normal.  Cardiovascular:     Rate and Rhythm: Regular rhythm. Tachycardia present.     Heart sounds: Normal heart sounds. No murmur heard. Pulmonary:     Breath sounds: Rales present.     Comments: Tachypnea with right base rales Abdominal:     General: Abdomen is flat. Bowel sounds are normal.     Palpations: Abdomen is soft.     Tenderness: There is no abdominal tenderness. There is no guarding.  Musculoskeletal:     Cervical back: Normal range of motion and neck supple.     Right lower leg: No edema.     Left lower leg: No edema.  Lymphadenopathy:     Cervical: No cervical adenopathy.  Skin:    General: Skin is warm and dry.     Coloration: Skin is pale.  Neurological:     Mental Status: He is disoriented.     Assessment/Plan: 1.  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  The acute injury appears to be largely hemodynamic in the setting of volume contraction with ongoing ARB use and likely evolved into ATN in the setting of sepsis.  Agree with stopping his ARB and improving hemodynamic status with fluids. Continue supportive management with treatment of the underlying pneumonia and intravenous fluids for "maintenance" given increased  insensible losses in the setting of his respiratory illness and limited intake in the setting of his encephalopathy.  I will quantify urine protein/creatinine ratio and check a total CK level.  Renal ultrasound  without any evidence of obstruction. Avoid nephrotoxic medications including NSAIDs and iodinated intravenous contrast exposure unless the latter is absolutely indicated.  Preferred narcotic agents for pain control are hydromorphone, fentanyl, and methadone. Morphine should not be used. Avoid Baclofen and avoid oral sodium phosphate and magnesium citrate based laxatives / bowel preps. Continue strict Input and Output monitoring. Will monitor the patient closely with you and intervene or adjust therapy as indicated by changes in clinical status/labs. 2.  Anion gap metabolic acidosis: Secondary to lactic acidosis in the setting of sepsis and acute kidney injury.  Improving with supportive management as seen on labs this morning. 3.  Severe sepsis with acute metabolic encephalopathy: Secondary to RML/RLL Streptococcus pneumonia.  On treatment with azithromycin and ceftriaxone per hospitalist service. 4.  Paroxysmal atrial fibrillation with RVR: Monitor with management of pneumonia/volume status.  On heparin drip.   Dagoberto Ligas 04/28/2023, 12:47 PM

## 2023-04-28 NOTE — ED Notes (Signed)
ED TO INPATIENT HANDOFF REPORT  ED Nurse Name and Phone #: Donny Pique, RN [phone]  S Name/Age/Gender Bradley Morgan 72 y.o. male Room/Bed: 011C/011C  Code Status   Code Status: Full Code  Home/SNF/Other Home Patient oriented to: self, place, and situation Is this baseline? Yes   Triage Complete: Triage complete  Chief Complaint Sepsis due to pneumonia (HCC) [J18.9, A41.9]  Triage Note Pt coming in for altered mental in which last week the wife reports he had a fever and was sick, and no the past few days he has been sleeping all day, not eating, sweating profusely. Pt presents with tachypnea, unable to answer most orientation questions, can move all extremities and is alert to voice.    Allergies Allergies  Allergen Reactions   Aspirin Other (See Comments)    Stomach ulcers. No high doses   Nsaids Other (See Comments)    CAUSES STOMACH ULCERS     Level of Care/Admitting Diagnosis ED Disposition     ED Disposition  Admit   Condition  --   Comment  Hospital Area: [facility] [[id number]]  Level of Care: Progressive [102]  Admit to Progressive based on following criteria: MULTISYSTEM THREATS such as stable sepsis, metabolic/electrolyte imbalance with or without encephalopathy that is responding to early treatment.  May admit patient to [facility] or [facility] if equivalent level of care is available:: Yes  Covid Evaluation: Confirmed COVID Positive  Diagnosis: Sepsis due to pneumonia [facility]) [[id number]]  Admitting Physician: Youlanda Roys [[id number]]  Attending Physician: Youlanda Roys [phone]  Certification:: I certify this patient will need inpatient services for at least 2 midnights  Estimated Length of Stay: 7          B Medical/Surgery History Past Medical History:  Diagnosis Date   Adrenal adenoma, left    Arthritis    Bursitis of elbow    Complication of anesthesia    ANESTHESIA DID NOT TAKE EFFECT WITH BACK SURGERY "I FELT THEM CUT"    Congenital absence of right kidney    Coronary artery disease    Diverticulosis of colon    Family history of anesthesia complication    "hard time waking up my dad" many yrs ago   Frequency of urination    Full dentures    GAD (generalized anxiety disorder)    GERD (gastroesophageal reflux disease)    History of acute renal failure    12-16-2013;  09-24-2017 due to stone obstruction   History of duodenal ulcer 04/2000   w/ gi bleed post egd w/ cauterization (caused by chronic nsaid use)   History of encephalopathy 04/26/2014   ACUTE EPISODE SECONDARY TO DRUG OVERDOSE (NARC, BZD, FLEXERIL)   History of GI bleed    08/ 2001   duodenal ulcer bleed post egd w/ cauterization   History of kidney stones    History of prostatitis    Hyperlipidemia    Hypertension    Left bundle branch block 05/05/2021   Left ureteral stone    Nocturia more than twice per night    Peripheral neuropathy    bilateral leg numbness due to back issues, walks w/ cane   Pneumonia    PONV (postoperative nausea and vomiting)    Restless leg syndrome    Solitary left kidney    Wears glasses    Past Surgical History:  Procedure Laterality Date   ANTERIOR CERVICAL DECOMP/DISCECTOMY FUSION  05-21-2009   dr Marnee Spring [facility]   C5-6, C6-7 and aviator  plate   CARDIOVASCULAR STRESS TEST  12/17/2013   FIXED DEFECT IN THE INFEROSEPTAL REGION BUT NO STRESS INDUCED ISCHEMIA/  NORMAL LV FUNCTION AND WALL MOTION , EF 63%   CLOSED MANIPULATION POST TOTAL KNEE ARTHROPLASTY Left 11-28-2001    dr Lequita Halt   CYSTOSCOPY WITH RETROGRADE PYELOGRAM, URETEROSCOPY AND STENT PLACEMENT Left 10/07/2017   Procedure: CYSTOSCOPY WITH LEFT RETROGRADE PYELOGRAM, BASKETRY OF STONE, URETEROSCOPY AND STENT REMOVAL;  Surgeon: Sebastian Ache, MD;  Location: [facility];  Service: Urology;  Laterality: Left;   CYSTOSCOPY WITH STENT PLACEMENT Left 09/24/2017   Procedure: RENAL CYSTOSCOPY LEFT RETROGRADE, LEFT URETERAL STENT PLACEMENT;   Surgeon: Sebastian Ache, MD;  Location: [facility] OR;  Service: Urology;  Laterality: Left;   HIP ARTHROSCOPY W/ LABRAL DEBRIDEMENT Left 08/12/2006   dr Lequita Halt [facility]   and chondraplasty   INCISION AND DRAINAGE ABSCESS Right 03/06/2015   Procedure: INCISION AND DRAINAGE ABSCESS RIGHT ARM;  Surgeon: Cindee Salt, MD;  Location: [facility];  Service: Orthopedics;  Laterality: Right;   INGUINAL HERNIA REPAIR Right 01/25/2022   Procedure: OPEN RIGHT INGUINAL HERNIA REPAIR WITH MESH;  Surgeon: Violeta Gelinas, MD;  Location: [facility] OR;  Service: General;  Laterality: Right;   INSERTION OF MESH N/A 08/03/2021   Procedure: INSERTION OF MESH;  Surgeon: Violeta Gelinas, MD;  Location: [facility] OR;  Service: General;  Laterality: N/A;   INSERTION OF MESH Right 01/25/2022   Procedure: INSERTION OF MESH;  Surgeon: Violeta Gelinas, MD;  Location: [facility] OR;  Service: General;  Laterality: Right;   KNEE ARTHROSCOPY Left x2  2002   LUMBAR SPINE SURGERY  x5  last one 2002   SHOULDER ARTHROSCOPY WITH SUBACROMIAL DECOMPRESSION, ROTATOR CUFF REPAIR AND BICEP TENDON REPAIR Right 06-27-2002  dr Lequita Halt [facility]   labral debridement and DCR   SHOULDER OPEN ROTATOR CUFF REPAIR Left 07/11/2014   Procedure: ROTATOR CUFF REPAIR SHOULDER OPEN WITH  GRAFT ;  Surgeon: Jacki Cones, MD;  Location: [facility];  Service: Orthopedics;  Laterality: Left;   SUPRA-UMBILICAL HERNIA N/A 08/03/2021   Procedure: SUPRA-UMBILICAL HERNIA REPAIR;  Surgeon: Violeta Gelinas, MD;  Location: [facility] OR;  Service: General;  Laterality: N/A;   TOTAL HIP ARTHROPLASTY Left 10/02/2018   Procedure: TOTAL HIP ARTHROPLASTY ANTERIOR APPROACH;  Surgeon: Ollen Gross, MD;  Location: [facility];  Service: Orthopedics;  Laterality: Left;    TOTAL HIP ARTHROPLASTY Right 07/30/2019   Procedure: TOTAL HIP ARTHROPLASTY ANTERIOR APPROACH;  Surgeon: Ollen Gross, MD;  Location: [facility];  Service: Orthopedics;  Laterality: Right;    TOTAL KNEE ARTHROPLASTY Left  10-09-2001   dr Lequita Halt [facility]   TOTAL KNEE REVISION Left 08/03/2017   Procedure: Left knee tibia polyethylene and patella revision;  Surgeon: Ollen Gross, MD;  Location: [facility];  Service: Orthopedics;  Laterality: Left;  with abductor block     A IV Location/Drains/Wounds Patient Lines/Drains/Airways Status     Active Line/Drains/Airways     Name Placement date Placement time Site Days   Peripheral IV 04/28/23 18 G Anterior;Left;Proximal Forearm 04/28/23  0007  Forearm  less than 1   Peripheral IV 04/28/23 20 G 1" Posterior;Right Wrist 04/28/23  0011  Wrist  less than 1   Ureteral Drain/Stent Left ureter 6 Fr. 09/24/17  1104  Left ureter  2042   Incision (Closed) 08/03/17 Knee Left 08/03/17  1603  -- 2094   Incision (Closed) 09/24/17 Penis 09/24/17  1109  -- 2042   Incision (Closed) 10/07/17 Penis 10/07/17  1610  -- 2029   Incision (Closed) 10/02/18 Hip Left 10/02/18  1253  -- 1669   Incision (Closed) 07/30/19 Hip Right 07/30/19  1522  -- 1368   Incision (Closed) 08/03/21 Abdomen Other (Comment) 08/03/21  0928  -- 633   Incision (Closed) 01/25/22 Groin Right 01/25/22  1348  -- 458            Intake/Output Last 24 hours  Intake/Output Summary (Last 24 hours) at 04/28/2023 1324 Last data filed at 04/28/2023 1238 Gross per 24 hour  Intake 2540.23 ml  Output --  Net 2540.23 ml    Labs/Imaging Results for orders placed or performed during the hospital encounter of 04/27/23 (from the past 48 hour(s))  Comprehensive metabolic panel     Status: Abnormal   Collection Time: 04/27/23 12:20 AM  Result Value Ref Range   Sodium 139 135 - 145 mmol/L   Potassium 3.7 3.5 - 5.1 mmol/L   Chloride 100 98 - 111 mmol/L   CO2 16 (L) 22 - 32 mmol/L   Glucose, Bld 106 (H) 70 - 99 mg/dL    Comment: Glucose reference range applies only to samples taken after fasting for at least 8 hours.   BUN 78 (H) 8 - 23 mg/dL   Creatinine, Ser 9.60 (H) 0.61 - 1.24 mg/dL   Calcium 8.9 8.9 - 45.4 mg/dL    Total Protein 6.7 6.5 - 8.1 g/dL   Albumin 3.0 (L) 3.5 - 5.0 g/dL   AST 60 (H) 15 - 41 U/L   ALT 32 0 - 44 U/L   Alkaline Phosphatase 79 38 - 126 U/L   Total Bilirubin 0.6 0.3 - 1.2 mg/dL   GFR, Estimated 10 (L) >60 mL/min    Comment: (NOTE) Calculated using the CKD-EPI Creatinine Equation (2021)    Anion gap 23 (H) 5 - 15    Comment: Performed at [facility], [address], [place], [place] [address]  CBC with Differential     Status: Abnormal   Collection Time: 04/27/23 12:20 AM  Result Value Ref Range   WBC 22.6 (H) 4.0 - 10.5 K/uL   RBC 4.70 4.22 - 5.81 MIL/uL   Hemoglobin 14.0 13.0 - 17.0 g/dL   HCT 91.4 78.2 - 95.6 %   MCV 88.1 80.0 - 100.0 fL   MCH 29.8 26.0 - 34.0 pg   MCHC 33.8 30.0 - 36.0 g/dL   RDW 21.3 08.6 - 57.8 %   Platelets 237 150 - 400 K/uL   nRBC 0.1 0.0 - 0.2 %   Neutrophils Relative % 90 %   Neutro Abs 20.2 (H) 1.7 - 7.7 K/uL   Lymphocytes Relative 1 %   Lymphs Abs 0.2 (L) 0.7 - 4.0 K/uL   Monocytes Relative 4 %   Monocytes Absolute 0.9 0.1 - 1.0 K/uL   Eosinophils Relative 0 %   Eosinophils Absolute 0.1 0.0 - 0.5 K/uL   Basophils Relative 0 %   Basophils Absolute 0.1 0.0 - 0.1 K/uL   Immature Granulocytes 5 %   Abs Immature Granulocytes 1.11 (H) 0.00 - 0.07 K/uL    Comment: Performed at [facility], [address] [place], [place] [address]  Protime-INR     Status: Abnormal   Collection Time: 04/27/23 12:20 AM  Result Value Ref Range   Prothrombin Time 19.6 (H) 11.4 - 15.2 seconds   INR 1.6 (H) 0.8 - 1.2    Comment: (NOTE) INR goal varies based on device and  disease states. Performed at [facility], [address], [place], [place] [address]   Culture, blood (Routine x 2)     Status: None (Preliminary result)   Collection Time: 04/28/23 12:20 AM   Specimen: BLOOD RIGHT ARM  Result Value Ref Range   Specimen Description BLOOD RIGHT ARM    Special Requests      BOTTLES DRAWN AEROBIC AND ANAEROBIC Blood Culture adequate  volume   Culture      NO GROWTH < 12 HOURS Performed at [facility], [address] [place], [place] [address]    Report Status PENDING   Culture, blood (Routine x 2)     Status: None (Preliminary result)   Collection Time: 04/28/23 12:20 AM   Specimen: BLOOD LEFT ARM  Result Value Ref Range   Specimen Description BLOOD LEFT ARM    Special Requests      BOTTLES DRAWN AEROBIC AND ANAEROBIC Blood Culture adequate volume   Culture      NO GROWTH < 12 HOURS Performed at [facility], [address] [place], [place] [address]    Report Status PENDING   Resp panel by RT-PCR (RSV, Flu A&B, Covid) Anterior Nasal Swab     Status: Abnormal   Collection Time: 04/28/23 12:20 AM   Specimen: Anterior Nasal Swab  Result Value Ref Range   SARS Coronavirus 2 by RT PCR POSITIVE (A) NEGATIVE   Influenza A by PCR NEGATIVE NEGATIVE   Influenza B by PCR NEGATIVE NEGATIVE    Comment: (NOTE) The Xpert Xpress SARS-CoV-2/FLU/RSV plus assay is intended as an aid in the diagnosis of influenza from Nasopharyngeal swab specimens and should not be used as a sole basis for treatment. Nasal washings and aspirates are unacceptable for Xpert Xpress SARS-CoV-2/FLU/RSV testing.  Fact Sheet for Patients: BloggerCourse.com  Fact Sheet for Healthcare Providers: SeriousBroker.it  This test is not yet approved or cleared by the [place] FDA and has been authorized for detection and/or diagnosis of SARS-CoV-2 by FDA under an Emergency Use Authorization (EUA). This EUA will remain in effect (meaning this test can be used) for the duration of the COVID-19 declaration under Section 564(b)(1) of the Act, 21 U.S.C. section 360bbb-3(b)(1), unless the authorization is terminated or revoked.     Resp Syncytial Virus by PCR NEGATIVE NEGATIVE    Comment: (NOTE) Fact Sheet for Patients: BloggerCourse.com  Fact Sheet for  Healthcare Providers: SeriousBroker.it  This test is not yet approved or cleared by the [place] FDA and has been authorized for detection and/or diagnosis of SARS-CoV-2 by FDA under an Emergency Use Authorization (EUA). This EUA will remain in effect (meaning this test can be used) for the duration of the COVID-19 declaration under Section 564(b)(1) of the Act, 21 U.S.C. section 360bbb-3(b)(1), unless the authorization is terminated or revoked.  Performed at [facility], [address] [place], [place] [address]   I-Stat venous blood gas, [facility] ED, [facility], [facility])     Status: Abnormal   Collection Time: 04/28/23 12:31 AM  Result Value Ref Range   pH, Ven 7.328 7.25 - 7.43   pCO2, Ven 33.7 (L) 44 - 60 mmHg   pO2, Ven 50 (H) 32 - 45 mmHg   Bicarbonate 17.7 (L) 20.0 - 28.0 mmol/L   TCO2 19 (L) 22 - 32 mmol/L   O2 Saturation 83 %   Acid-base deficit 7.0 (H) 0.0 - 2.0 mmol/L   Sodium 137 135 - 145 mmol/L  Potassium 3.6 3.5 - 5.1 mmol/L   Calcium, Ion 1.01 (L) 1.15 - 1.40 mmol/L   HCT 42.0 39.0 - 52.0 %   Hemoglobin 14.3 13.0 - 17.0 g/dL   Sample type VENOUS   I-Stat Lactic Acid, ED     Status: Abnormal   Collection Time: 04/28/23 12:32 AM  Result Value Ref Range   Lactic Acid, Venous 3.2 (HH) 0.5 - 1.9 mmol/L   Comment NOTIFIED PHYSICIAN   I-Stat Creatinine, ED (not at [facility] or [facility])     Status: Abnormal   Collection Time: 04/28/23 12:32 AM  Result Value Ref Range   Creatinine, Ser 6.60 (H) 0.61 - 1.24 mg/dL  I-Stat Lactic Acid, ED     Status: None   Collection Time: 04/28/23  1:31 AM  Result Value Ref Range   Lactic Acid, Venous 1.9 0.5 - 1.9 mmol/L  HIV Antibody (routine testing w rflx)     Status: None   Collection Time: 04/28/23  4:54 AM  Result Value Ref Range   HIV Screen 4th Generation wRfx Non Reactive Non Reactive    Comment: Performed at [facility], [address], [place], [place] [address]  Comprehensive metabolic panel      Status: Abnormal   Collection Time: 04/28/23  4:54 AM  Result Value Ref Range   Sodium 138 135 - 145 mmol/L   Potassium 4.1 3.5 - 5.1 mmol/L   Chloride 101 98 - 111 mmol/L   CO2 18 (L) 22 - 32 mmol/L   Glucose, Bld 110 (H) 70 - 99 mg/dL    Comment: Glucose reference range applies only to samples taken after fasting for at least 8 hours.   BUN 80 (H) 8 - 23 mg/dL   Creatinine, Ser 4.69 (H) 0.61 - 1.24 mg/dL   Calcium 8.6 (L) 8.9 - 10.3 mg/dL   Total Protein 6.0 (L) 6.5 - 8.1 g/dL   Albumin 2.6 (L) 3.5 - 5.0 g/dL   AST 75 (H) 15 - 41 U/L   ALT 31 0 - 44 U/L   Alkaline Phosphatase 89 38 - 126 U/L   Total Bilirubin 0.6 0.3 - 1.2 mg/dL   GFR, Estimated 10 (L) >60 mL/min    Comment: (NOTE) Calculated using the CKD-EPI Creatinine Equation (2021)    Anion gap 19 (H) 5 - 15    Comment: Performed at [facility], [address], [place], [place] [address]  CBC with Differential/Platelet     Status: Abnormal   Collection Time: 04/28/23  4:54 AM  Result Value Ref Range   WBC 18.9 (H) 4.0 - 10.5 K/uL   RBC 4.24 4.22 - 5.81 MIL/uL   Hemoglobin 12.4 (L) 13.0 - 17.0 g/dL   HCT 84.1 (L) 32.4 - 40.1 %   MCV 88.7 80.0 - 100.0 fL   MCH 29.2 26.0 - 34.0 pg   MCHC 33.0 30.0 - 36.0 g/dL   RDW 02.7 25.3 - 66.4 %   Platelets 187 150 - 400 K/uL   nRBC 0.0 0.0 - 0.2 %   Neutrophils Relative % 92 %   Neutro Abs 17.4 (H) 1.7 - 7.7 K/uL   Lymphocytes Relative 2 %   Lymphs Abs 0.3 (L) 0.7 - 4.0 K/uL   Monocytes Relative 4 %   Monocytes Absolute 0.8 0.1 - 1.0 K/uL   Eosinophils Relative 0 %   Eosinophils Absolute 0.0 0.0 - 0.5 K/uL   Basophils Relative 0 %   Basophils Absolute 0.0 0.0 -  0.1 K/uL   Immature Granulocytes 2 %   Abs Immature Granulocytes 0.42 (H) 0.00 - 0.07 K/uL    Comment: Performed at [facility], [address] [place], [place] [address]  Urinalysis, w/ Reflex to Culture (Infection Suspected) -Urine, Clean Catch     Status: Abnormal   Collection Time: 04/28/23  6:16  AM  Result Value Ref Range   Specimen Source URINE, CATHETERIZED    Color, Urine AMBER (A) YELLOW    Comment: BIOCHEMICALS MAY BE AFFECTED BY COLOR   APPearance HAZY (A) CLEAR   Specific Gravity, Urine 1.016 1.005 - 1.030   pH 5.0 5.0 - 8.0   Glucose, UA NEGATIVE NEGATIVE mg/dL   Hgb urine dipstick LARGE (A) NEGATIVE   Bilirubin Urine NEGATIVE NEGATIVE   Ketones, ur NEGATIVE NEGATIVE mg/dL   Protein, ur 604 (A) NEGATIVE mg/dL   Nitrite NEGATIVE NEGATIVE   Leukocytes,Ua NEGATIVE NEGATIVE   RBC / HPF 0-5 0 - 5 RBC/hpf   WBC, UA 6-10 0 - 5 WBC/hpf    Comment:        Reflex urine culture not performed if WBC <=10, OR if Squamous epithelial cells >5. If Squamous epithelial cells >5 suggest recollection.    Bacteria, UA FEW (A) NONE SEEN   Squamous Epithelial / HPF 0-5 0 - 5 /HPF   Hyaline Casts, UA PRESENT    Granular Casts, UA PRESENT    Amorphous Crystal PRESENT    Sperm, UA PRESENT     Comment: Performed at [facility], [address], [place], [place] [address]  Strep pneumoniae urinary antigen     Status: Abnormal   Collection Time: 04/28/23  6:16 AM  Result Value Ref Range   Strep Pneumo Urinary Antigen POSITIVE (A) NEGATIVE    Comment: Performed at [facility], [address], [place], [place] [address]  Heparin level (unfractionated)     Status: Abnormal   Collection Time: 04/28/23 11:02 AM  Result Value Ref Range   Heparin Unfractionated <0.10 (L) 0.30 - 0.70 IU/mL    Comment: (NOTE) The clinical reportable range upper limit is being lowered to >1.10 to align with the FDA approved guidance for the current laboratory assay.  If heparin results are below expected values, and patient dosage has  been confirmed, suggest follow up testing of antithrombin III levels. Performed at [facility], [address], [place], [place] [address]    US RENAL  Result Date: 04/28/2023 CLINICAL DATA:  Acute kidney injury. EXAM: RENAL / URINARY TRACT ULTRASOUND  COMPLETE COMPARISON:  None Available. FINDINGS: Right Kidney: The right kidney is not visualized. Left Kidney: Renal measurements: 11.2 cm x 5.7 cm x 7.4 cm = volume: 245.4 mL. Echogenicity within normal limits. A 1.6 cm x 1.2 cm x 1.3 cm simple cyst is seen within the left kidney. No abnormal flow is noted within this region on color Doppler evaluation. No hydronephrosis is visualized. Bladder: A mild amount of echogenic debris is seen within the dependent portion of the gallbladder lumen. Other: None. IMPRESSION: 1. Nonvisualization of the right kidney. 2. 1.6 cm x 1.2 cm x 1.3 cm simple left renal cyst. 3. Mild amount of echogenic debris within the urinary bladder. A small amount of blood products cannot be excluded. Correlation with urinalysis is recommended. Electronically Signed   By: Aram Candela M.D.   On: 04/28/2023 03:16   CT Head Wo Contrast  Result Date: 04/27/2023 CLINICAL DATA:  Altered mental  status. EXAM: CT HEAD WITHOUT CONTRAST TECHNIQUE: Contiguous axial images were obtained from the base of the skull through the vertex without intravenous contrast. RADIATION DOSE REDUCTION: This exam was performed according to the departmental dose-optimization program which includes automated exposure control, adjustment of the mA and/or kV according to patient size and/or use of iterative reconstruction technique. COMPARISON:  April 26, 2014 FINDINGS: Brain: There is mild cerebral atrophy with widening of the extra-axial spaces and ventricular dilatation. There are areas of decreased attenuation within the white matter tracts of the supratentorial brain, consistent with microvascular disease changes. Vascular: No hyperdense vessel or unexpected calcification. Skull: Normal. Negative for fracture or focal lesion. Sinuses/Orbits: There is mild left maxillary sinus mucosal thickening. Other: None. IMPRESSION: 1. Generalized cerebral atrophy with mild, chronic white matter small vessel ischemic changes. 2.  No acute intracranial abnormality. 3. Mild left maxillary sinus disease. Electronically Signed   By: Aram Candela M.D.   On: 04/27/2023 23:58   DG Chest 2 View  Result Date: 04/27/2023 CLINICAL DATA:  Suspected sepsis. EXAM: CHEST - 2 VIEW COMPARISON:  Chest radiograph 10/04/2014. FINDINGS: Dense consolidation in the right lung base likely involving both right middle and lower lobes. Suspected air bronchograms. Mild peribronchial thickening. Streaky atelectasis at the left lung base. Grossly stable heart size and mediastinal contours. No large pleural effusion. No pneumothorax. Cervical spine hardware is partially included. IMPRESSION: Dense consolidation in the right lung base likely involving both right middle and lower lobes, suspicious for pneumonia. Followup PA and lateral chest X-ray is recommended in 3-4 weeks following trial of antibiotic therapy to ensure resolution and exclude underlying malignancy. Electronically Signed   By: Narda Rutherford M.D.   On: 04/27/2023 23:45    Pending Labs Unresulted Labs (From admission, onward)     Start     Ordered   04/29/23 0500  CBC  Daily,   R      04/28/23 1007   04/29/23 0500  Comprehensive metabolic panel  Daily,   R      04/28/23 1151   04/28/23 2000  Heparin level (unfractionated)  Once-Timed,   TIMED        04/28/23 1230   04/28/23 1308  Protein / creatinine ratio, urine  Add-on,   AD        04/28/23 1307   04/28/23 1308  CK  Add-on,   AD        04/28/23 1307   04/28/23 0244  Expectorated Sputum Assessment w Gram Stain, Rflx to Resp Cult  (Severe pneumonia (requires ICU care) in adults without resistant organism risk factors )  ONCE - URGENT,   URGENT       Question:  Patient immune status  Answer:  Normal   04/28/23 0244   04/28/23 0244  Legionella Urine Antigen  (Severe pneumonia (requires ICU care) in adults without resistant organism risk factors )  ONCE - URGENT,   URGENT        04/28/23 0244             Vitals/Pain Today's Vitals   04/28/23 1045 04/28/23 1100 04/28/23 1112 04/28/23 1207  BP: 111/82 107/81    Pulse: (!) 114 99    Resp: (!) 25 (!) 29    Temp:   98.5 F (36.9 C)   TempSrc:   Oral   SpO2: 94% 94%    Weight:      Height:      PainSc:    7  Isolation Precautions Airborne and Contact precautions  Medications Medications  heparin ADULT infusion 100 units/mL (25000 units/276mL) (1,600 Units/hr Intravenous Rate/Dose Change 04/28/23 1237)  aspirin EC tablet 81 mg (81 mg Oral Given 04/28/23 0920)  rosuvastatin (CRESTOR) tablet 20 mg (20 mg Oral Given 04/28/23 0920)  pantoprazole (PROTONIX) EC tablet 40 mg (40 mg Oral Given 04/28/23 0920)  clonazePAM (KLONOPIN) tablet 0.5 mg (has no administration in time range)  gabapentin (NEURONTIN) capsule 100 mg (100 mg Oral Given 04/28/23 0919)  rOPINIRole (REQUIP) tablet 1 mg (has no administration in time range)  0.9 %  sodium chloride infusion ( Intravenous Infusion Verify 04/28/23 1207)  cefTRIAXone (ROCEPHIN) 2 g in sodium chloride 0.9 % 100 mL IVPB (has no administration in time range)  azithromycin (ZITHROMAX) 500 mg in sodium chloride 0.9 % 250 mL IVPB (has no administration in time range)  dexamethasone (DECADRON) tablet 6 mg (6 mg Oral Given 04/28/23 0345)  carvedilol (COREG) tablet 6.25 mg (6.25 mg Oral Given 04/28/23 0920)  HYDROcodone-acetaminophen (NORCO/VICODIN) 5-325 MG per tablet 1 tablet (has no administration in time range)  acetaminophen (TYLENOL) tablet 650 mg (has no administration in time range)  lactated ringers bolus 1,000 mL (0 mLs Intravenous Stopped 04/28/23 0117)  acetaminophen (TYLENOL) tablet 1,000 mg (1,000 mg Oral Given 04/28/23 0028)  cefTRIAXone (ROCEPHIN) 2 g in sodium chloride 0.9 % 100 mL IVPB (0 g Intravenous Stopped 04/28/23 0052)  azithromycin (ZITHROMAX) 500 mg in sodium chloride 0.9 % 250 mL IVPB (0 mg Intravenous Stopped 04/28/23 0122)  lactated ringers bolus 1,000 mL (0 mLs Intravenous Stopped 04/28/23  0232)  heparin bolus via infusion 4,000 Units (4,000 Units Intravenous Bolus from Bag 04/28/23 0158)  heparin bolus via infusion 2,000 Units (2,000 Units Intravenous Bolus from Bag 04/28/23 1238)    Mobility walks with device     Focused Assessments     R Recommendations: See Admitting Provider Note  Report given to:   Additional Notes: Patient is alert and oriented to person, place and situation, was confused about time. Patient reports body aches all over and feels bad but otherwise no complaints of shortness of breath. Patient is also tired but arouses to voice.

## 2023-04-28 NOTE — Hospital Course (Addendum)
72 y.o.m w/ chronic congestive heart failure with moderately reduced ejection fraction (EF 45%), hypertension, hyperlipidemia, CKD IIIa with solitary left kidney (baseline creatinine 1.2-1.6), chronic left bundle branch block, GADr who was brought to hospital with complaints of altered mental status. Reportedly patient has been ill for 7 days.He gradually developed progressive fatigue and weakness, fever, poor appetite, increased lethargy, diaphoresis and ongoing dry, nonproductive cough and shortness of breath and in the past 24 hours patient's mental status has deteriorated, became increasingly confused.  In the NG:EXBMWUXL with temperature 99.9 F,Blood pressure was stable 140/83, patient was tachycardic, heart rate up to 140/min, with telemetry showing atrial fibrillation,tachypneic, 25/min,hypoxic, saturation 90 to 93% on 3 L oxygen. VBG showed pH 7.32 with pCO2 32 and pO2 50. Labs >leukocytosis 22.6, no anemia, hemoglobin 14.3.Marland Kitchen worsening renal function BUN 78 and creatinine of 6.6 with lactic acidosis 3.2.Patient tested positive for COVID-19 CXR>large large consolidations in the right middle and right lower lobe. CT head showed no acute intracranial abnormalities. Urine strep pna ag+ He was started on ceftriaxone and azithromycin,given IV fluids,heparin infusion and admitted for further management

## 2023-04-28 NOTE — H&P (Addendum)
History and Physical    Bradley Morgan MWU:[id number] DOB: February 27, 1951 DOA: 04/27/2023  PCP: Barbie Banner, MD   Patient coming from:  Home   I have personally briefly reviewed patient's old medical records in [facility]  Chief Complaint:  Altered mental status   HPI: Bradley Morgan is a 72 y.o. male with medical history significant of chronic congestive heart failure with moderately reduced ejection fraction (EF 45%), hypertension, hyperlipidemia, chronic kidney disease stage IIIa with solitary left kidney (baseline creatinine 1.2-1.6), chronic left bundle branch block, generalized anxiety disorder who was brought to hospital with complaints of altered mental status.  History is obtained from his wife, as patient has ongoing altered mental status, unable provide history.  Reportedly patient has been ill for 7 days.  He gradually developed progressive fatigue and weakness, fever, poor appetite, increased lethargy, diaphoresis and ongoing dry, nonproductive cough and shortness of breath.  Open the past 24 hours patient's mental status has deteriorated he became increasing confused.  Due to his ongoing symptoms patient was brought to ED for evaluation..  ED Course:  In the ED patient was found to be afebrile with temperature 99.9 F. Blood pressure was stable 140/83, patient was tachycardic, heart rate up to 140/min, with telemetry showing atrial fibrillation.  Patient was tachypneic, 25/min.  Patient was hypoxic, saturation 90 to 93% on 3 L oxygen. VBG showed pH 7.32 with pCO2 32 and pO2 50. CBC revealed significant leukocytosis 22.6, no anemia, hemoglobin 14.3.Marland Kitchen Chemistry showing worsening renal function BUN 5778 and creatinine of 6.6 with lactic acidosis, CO2 16, lactate 3.2.. Patient tested positive for COVID-19. Chest x-ray revealed large large consolidations in the right middle and right lower lobe. CT head showed no acute intracranial abnormalities.  Patient was treated with  broad-spectrum antibiotics, ceftriaxone and azithromycin, given IV fluids, started on heparin infusion. Given ongoing symptoms patient will be admitted to hospital for further evaluation and treatment.  Review of Systems:  Unable to obtain directly review of system from the patient, obtained from the wife As per HPI otherwise all other systems reviewed and are negative.  Past Medical History:  Diagnosis Date   Adrenal adenoma, left    Arthritis    Bursitis of elbow    Complication of anesthesia    ANESTHESIA DID NOT TAKE EFFECT WITH BACK SURGERY "I FELT THEM CUT"   Congenital absence of right kidney    Coronary artery disease    Diverticulosis of colon    Family history of anesthesia complication    "hard time waking up my dad" many yrs ago   Frequency of urination    Full dentures    GAD (generalized anxiety disorder)    GERD (gastroesophageal reflux disease)    History of acute renal failure    12-16-2013;  09-24-2017 due to stone obstruction   History of duodenal ulcer 04/2000   w/ gi bleed post egd w/ cauterization (caused by chronic nsaid use)   History of encephalopathy 04/26/2014   ACUTE EPISODE SECONDARY TO DRUG OVERDOSE (NARC, BZD, FLEXERIL)   History of GI bleed    08/ 2001   duodenal ulcer bleed post egd w/ cauterization   History of kidney stones    History of prostatitis    Hyperlipidemia    Hypertension    Left bundle branch block 05/05/2021   Left ureteral stone    Nocturia more than twice per night    Peripheral neuropathy    bilateral leg numbness due to  back issues, walks w/ cane   Pneumonia    PONV (postoperative nausea and vomiting)    Restless leg syndrome    Solitary left kidney    Wears glasses     Past Surgical History:  Procedure Laterality Date   ANTERIOR CERVICAL DECOMP/DISCECTOMY FUSION  05-21-2009   dr Marnee [facility]   C5-6, C6-7 and aviator plate   CARDIOVASCULAR STRESS TEST  12/17/2013   FIXED DEFECT IN THE INFEROSEPTAL REGION BUT NO  STRESS INDUCED ISCHEMIA/  NORMAL LV FUNCTION AND WALL MOTION , EF 63%   CLOSED MANIPULATION POST TOTAL KNEE ARTHROPLASTY Left 11-28-2001    dr Lequita Halt   CYSTOSCOPY WITH RETROGRADE PYELOGRAM, URETEROSCOPY AND STENT PLACEMENT Left 10/07/2017   Procedure: CYSTOSCOPY WITH LEFT RETROGRADE PYELOGRAM, BASKETRY OF STONE, URETEROSCOPY AND STENT REMOVAL;  Surgeon: Sebastian Ache, MD;  Location: [facility];  Service: Urology;  Laterality: Left;   CYSTOSCOPY WITH STENT PLACEMENT Left 09/24/2017   Procedure: RENAL CYSTOSCOPY LEFT RETROGRADE, LEFT URETERAL STENT PLACEMENT;  Surgeon: Sebastian Ache, MD;  Location: [facility] OR;  Service: Urology;  Laterality: Left;   HIP ARTHROSCOPY W/ LABRAL DEBRIDEMENT Left 08/12/2006   dr Lequita Halt [facility]   and chondraplasty   INCISION AND DRAINAGE ABSCESS Right 03/06/2015   Procedure: INCISION AND DRAINAGE ABSCESS RIGHT ARM;  Surgeon: Cindee Salt, MD;  Location: [facility];  Service: Orthopedics;  Laterality: Right;   INGUINAL HERNIA REPAIR Right 01/25/2022   Procedure: OPEN RIGHT INGUINAL HERNIA REPAIR WITH MESH;  Surgeon: Violeta Gelinas, MD;  Location[facility] OR;  Service: General;  Laterality: Right;   INSERTION OF MESH N/A 08/03/2021   Procedure: INSERTION OF MESH;  Surgeon: Violeta Gelinas, MD;  Location: [facility] OR;  Service: General;  Laterality: N/A;   INSERTION OF MESH Right 01/25/2022   Procedure: INSERTION OF MESH;  Surgeon: Violeta Gelinas, MD;  Location: [facility] OR;  Service: General;  Laterality: Right;   KNEE ARTHROSCOPY Left x2  2002   LUMBAR SPINE SURGERY  x5  last one 2002   SHOULDER ARTHROSCOPY WITH SUBACROMIAL DECOMPRESSION, ROTATOR CUFF REPAIR AND BICEP TENDON REPAIR Right 06-27-2002  dr Lequita Halt [facility]   labral debridement and DCR   SHOULDER OPEN ROTATOR CUFF REPAIR Left 07/11/2014   Procedure: ROTATOR CUFF REPAIR SHOULDER OPEN WITH  GRAFT ;  Surgeon: Jacki Cones, MD;  Location: [facility];  Service: Orthopedics;  Laterality: Left;   SUPRA-UMBILICAL  HERNIA N/A 08/03/2021   Procedure: SUPRA-UMBILICAL HERNIA REPAIR;  Surgeon: Violeta Gelinas, MD;  Location: [facility] OR;  Service: General;  Laterality: N/A;   TOTAL HIP ARTHROPLASTY Left 10/02/2018   Procedure: TOTAL HIP ARTHROPLASTY ANTERIOR APPROACH;  Surgeon: Ollen Gross, MD;  Location: [facility];  Service: Orthopedics;  Laterality: Left;    TOTAL HIP ARTHROPLASTY Right 07/30/2019   Procedure: TOTAL HIP ARTHROPLASTY ANTERIOR APPROACH;  Surgeon: Ollen Gross, MD;  Location: [facility];  Service: Orthopedics;  Laterality: Right;    TOTAL KNEE ARTHROPLASTY Left 10-09-2001   dr Lequita Halt [facility]   TOTAL KNEE REVISION Left 08/03/2017   Procedure: Left knee tibia polyethylene and patella revision;  Surgeon: Ollen Gross, MD;  Location: [facility];  Service: Orthopedics;  Laterality: Left;  with abductor block    Social History  reports that he quit smoking about 24 years ago. His smoking use included cigarettes. He started smoking about 39 years ago. He has never used smokeless tobacco. He reports that he does not drink alcohol and does not use drugs.  Allergies  Allergen Reactions   Aspirin Other (See Comments)    Stomach ulcers. No high doses   Nsaids Other (See Comments)    CAUSES STOMACH ULCERS     Family History  Problem Relation Age of Onset   Heart failure Father    Diabetes Father    Hypertension Father    Anxiety disorder Mother        also tended to overdose on anxiety medication   Prior to Admission medications   Medication Sig Start Date End Date Taking? Authorizing Provider  aspirin EC 81 MG tablet Take 81 mg by mouth daily. Swallow whole.    [provider]  Carboxymethylcellul-Glycerin (LUBRICATING EYE DROPS OP) Place 1 drop into both eyes daily as needed (dry eyes).    [provider]  carvedilol (COREG) 3.125 MG tablet Take 3.125 mg by mouth 2 (two) times daily with a meal.     [provider]  clonazePAM (KLONOPIN) 0.5 MG tablet Take 0.5 mg  by mouth 3 (three) times daily as needed for anxiety.    [provider]  cyclobenzaprine (FLEXERIL) 10 MG tablet Take 1 tablet (10 mg total) 3 (three) times daily as needed by mouth for muscle spasms. 08/04/17   [name], [name], PA-C  furosemide (LASIX) 20 MG tablet Take 20 mg by mouth daily as needed (fluid retention). 06/19/18   [provider]  gabapentin (NEURONTIN) 300 MG capsule Take 300 mg by mouth 2 (two) times daily.    [provider]  HYDROcodone-acetaminophen (NORCO) 7.5-325 MG tablet Take 1 tablet by mouth every 6 (six) hours as needed for moderate pain.    [provider]  losartan (COZAAR) 25 MG tablet Take 1 tablet (25 mg total) by mouth daily. 03/23/23 06/21/23  [name], MD  pantoprazole (PROTONIX) 40 MG tablet Take 40 mg by mouth 2 (two) times daily.    [provider]  rOPINIRole (REQUIP) 1 MG tablet Take 1 mg by mouth at bedtime.    [provider]  rosuvastatin (CRESTOR) 20 MG tablet Take 1 tablet (20 mg total) by mouth daily. 03/23/23   [name], MD  triamcinolone cream (KENALOG) 0.1 % Apply 1 application. topically daily as needed (rash). 04/23/21   [provider]    Physical Exam: Vitals:   04/27/23 2315 04/28/23 0015 04/28/23 0045 04/28/23 0124  BP: 99/80 (!) 136/94 (!) 140/83   Pulse: (!) 140 83 (!) 36   Resp: (!) 26 (!) 27 (!) 25   Temp: 99.9 F (37.7 C)     SpO2: 90% 91% 93%   Weight:    89.6 kg  Height:    6\' 1"  (1.854 m)    Constitutional: Lethargic, tachypneic, arousable Vitals:   04/27/23 2315 04/28/23 0015 04/28/23 0045 04/28/23 0124  BP: 99/80 (!) 136/94 (!) 140/83   Pulse: (!) 140 83 (!) 36   Resp: (!) 26 (!) 27 (!) 25   Temp: 99.9 F (37.7 C)     SpO2: 90% 91% 93%   Weight:    89.6 kg  Height:    6\' 1"  (1.854 m)   Eyes: PERRL, lids and conjunctivae normal ENMT: Mucous membranes are moist. Posterior pharynx clear of any exudate or lesions.Normal dentition.  Neck:  normal, supple, no masses, no thyromegaly Respiratory: Tachypneic, decreased breath sound bilaterally, no wheezing, no crackles.  Increased respiratory effort, use of accessory muscles Cardiovascular: Irregular rate and rhythm, no murmurs / rubs / gallops. No extremity edema. 2+ pedal  pulses. No carotid bruits.  Abdomen: no tenderness, no masses palpated. No hepatosplenomegaly. Bowel sounds positive.  Musculoskeletal: no clubbing / cyanosis. No joint deformity upper and lower extremities. Good ROM, no contractures. Normal muscle tone.  Skin: no rashes, lesions, ulcers. No induration Neurologic: CN 2-12 grossly intact. Sensation intact, DTR normal. Strength 5/5 in all 4.  Psychiatric:, Lethargic, alert and oriented x 1 to person only  Labs on Admission: I have personally reviewed following labs and imaging studies  CBC: Recent Labs  Lab 04/27/23 0020 04/28/23 0031  WBC 22.6*  --   NEUTROABS 20.2*  --   HGB 14.0 14.3  HCT 41.4 42.0  MCV 88.1  --   PLT 237  --     Basic Metabolic Panel: Recent Labs  Lab 04/27/23 0020 04/28/23 0031 04/28/23 0032  NA 139 137  --   K 3.7 3.6  --   CL 100  --   --   CO2 16*  --   --   GLUCOSE 106*  --   --   BUN 78*  --   --   CREATININE 5.87*  --  6.60*  CALCIUM 8.9  --   --     GFR: Estimated Creatinine Clearance: 11.8 mL/min (A) (by C-G formula based on SCr of 6.6 mg/dL (H)).  Liver Function Tests: Recent Labs  Lab 04/27/23 0020  AST 60*  ALT 32  ALKPHOS 79  BILITOT 0.6  PROT 6.7  ALBUMIN 3.0*    Urine analysis:    Component Value Date/Time   COLORURINE YELLOW 12/25/2021 2243   APPEARANCEUR CLEAR 12/25/2021 2243   LABSPEC 1.027 12/25/2021 2243   PHURINE 5.0 12/25/2021 2243   GLUCOSEU NEGATIVE 12/25/2021 2243   HGBUR SMALL (A) 12/25/2021 2243   BILIRUBINUR NEGATIVE 12/25/2021 2243   KETONESUR NEGATIVE 12/25/2021 2243   PROTEINUR 100 (A) 12/25/2021 2243   UROBILINOGEN 0.2 05/02/2015 1120   NITRITE NEGATIVE 12/25/2021 2243    LEUKOCYTESUR NEGATIVE 12/25/2021 2243    Radiological Exams on Admission: CT Head Wo Contrast  Result Date: 04/27/2023 CLINICAL DATA:  Altered mental status. EXAM: CT HEAD WITHOUT CONTRAST TECHNIQUE: Contiguous axial images were obtained from the base of the skull through the vertex without intravenous contrast. RADIATION DOSE REDUCTION: This exam was performed according to the departmental dose-optimization program which includes automated exposure control, adjustment of the mA and/or kV according to patient size and/or use of iterative reconstruction technique. COMPARISON:  April 26, 2014 FINDINGS: Brain: There is mild cerebral atrophy with widening of the extra-axial spaces and ventricular dilatation. There are areas of decreased attenuation within the white matter tracts of the supratentorial brain, consistent with microvascular disease changes. Vascular: No hyperdense vessel or unexpected calcification. Skull: Normal. Negative for fracture or focal lesion. Sinuses/Orbits: There is mild left maxillary sinus mucosal thickening. Other: None. IMPRESSION: 1. Generalized cerebral atrophy with mild, chronic white matter small vessel ischemic changes. 2. No acute intracranial abnormality. 3. Mild left maxillary sinus disease. Electronically Signed   By: Aram Candela M.D.   On: 04/27/2023 23:58   DG Chest 2 View  Result Date: 04/27/2023 CLINICAL DATA:  Suspected sepsis. EXAM: CHEST - 2 VIEW COMPARISON:  Chest radiograph 10/04/2014. FINDINGS: Dense consolidation in the right lung base likely involving both right middle and lower lobes. Suspected air bronchograms. Mild peribronchial thickening. Streaky atelectasis at the left lung base. Grossly stable heart size and mediastinal contours. No large pleural effusion. No pneumothorax. Cervical spine hardware is partially included. IMPRESSION: Dense consolidation  in the right lung base likely involving both right middle and lower lobes, suspicious for  pneumonia. Followup PA and lateral chest X-ray is recommended in 3-4 weeks following trial of antibiotic therapy to ensure resolution and exclude underlying malignancy. Electronically Signed   By: Narda Rutherford M.D.   On: 04/27/2023 23:45    EKG and telemetry reviewed, showing atrial fibrillation with heart rate 120-130/min.  Assessment/Plan Principal Problem:   Sepsis due to pneumonia [facility]) Active Problems:   Essential hypertension, benign   Pure hypercholesterolemia   Chronic systolic heart failure (HCC)   Acute respiratory failure with hypoxia (HCC)   AKI (acute kidney injury) (HCC)   Paroxysmal atrial fibrillation with RVR (HCC)   Sepsis due to pneumonia Acute hypoxemic respiratory failure Lactic acidosis Acute metabolic encephalopathy due to sepsis Patient has evidence of consolidation of the chest x-ray affecting right middle and right lower lobe. Per history it appears to be community-acquired.  Patient is hypoxic, requires 3 L of oxygen. Patient has altered mental status as a result of sepsis. Plan. Start treatment of pneumonia with IV ceftriaxone, azithromycin.  Continue IV fluids, supplemental oxygen. Incentive spirometry.  Check urinary Legionella and Streptococcus pneumonia antigen. Follow-up blood cultures.  COVID-19 virus infection Pneumonia appears to be more bacterial than viral, although given COVID-19 positivity patient may benefit from a course of steroids. As patient symptoms has been present for longer than 5 days, he is not candidate for remdesivir.  Acute kidney injury superimposed on chronic kidney disease stage IIIa Suspect due to volume contraction, as patient has not been eating and drinking for the last 7 days. Patient has solitary left kidney.  Baseline creatinine 1.2-1.6. Plan. Continue IV fluids.  Check renal ultrasound rule out, obstruction.  Monitor renal function daily. If renal function fails to improve with fluids consider involving nephrology  team  Paroxysmal atrial fibrillation with rapid ventricular response Chronic left bundle branch block. Telemetry revealing PAF with RVR.  This is a new diagnosis, not present before. If not controlled patient may need rate control with IV diltiazem.  Continue IV fluids.  In the ED patient was started on heparin infusion. Check 2D echocardiogram (last echocardiogram showed ejection fraction 44% in September 2022) Patient will benefit from updated 2D echocardiogram.  Hypertension Continue carvedilol.  Blood pressure reasonably well-controlled.  Hyperlipidemia Continue lipid-lowering therapy with Crestor  Restless leg syndrome Continue nightly ropinirole  Gastroesophageal reflux disease Continue twice daily pantoprazole.  DVT prophylaxis: Heparin Infusion   Code Status:        Full code Family Communication:  Yes, d/w wife    Disposition Plan:   Patient is from:  Home  Anticipated DC to:  Home vs SNF  Anticipated DC date:  3-5 days   Anticipated DC barriers: None  Consults called:  No  Admission status:  Inpatient, progressive care unit    Severity of Illness: The appropriate patient status for this patient is INPATIENT. Inpatient status is judged to be reasonable and necessary in order to provide the required intensity of service to ensure the patient's safety. The patient's presenting symptoms, physical exam findings, and initial radiographic and laboratory data in the context of their chronic comorbidities is felt to place them at high risk for further clinical deterioration. Furthermore, it is not anticipated that the patient will be medically stable for discharge from the hospital within 2 midnights of admission.   * I certify that at the point of admission it is my clinical judgment that the patient will  require inpatient hospital care spanning beyond 2 midnights from the point of admission due to high intensity of service, high risk for further deterioration and high  frequency of surveillance required.Youlanda Roys MD [facility]  How to contact the [facility] Attending or Consulting provider 7A - 7P or covering provider during after hours 7P -7A, for this patient?   Check the care team in [facility] and look for a) attending/consulting [facility] provider listed and b) the [facility] team listed Log into [email or web address] and use [place]'s universal password to access. If you do not have the password, please contact the hospital operator. Locate the [facility] provider you are looking for under [facility] and page to a number that you can be directly reached. If you still have difficulty reaching the provider, please page the [facility] (Director on Call) for the Hospitalists listed on amion for assistance.  04/28/2023, 2:32 AM

## 2023-04-28 NOTE — ED Notes (Signed)
Nephrologist at bedside

## 2023-04-28 NOTE — ED Notes (Signed)
Contacted 5W, ready to receive pt at this time.

## 2023-04-28 NOTE — Progress Notes (Signed)
   04/28/23 2258  BiPAP/CPAP/SIPAP  $ Non-Invasive Ventilator  (S)  Non-Invasive Vent Initial  $ Non-Invasive Home Ventilator  Initial  $ Face Mask Large  Yes  BiPAP/CPAP/SIPAP Pt Type Adult  BiPAP/CPAP/SIPAP V60  Mask Type Full face mask  Mask Size Large  Set Rate 12 breaths/min  Respiratory Rate 21 breaths/min  IPAP 12 cmH20  EPAP 6 cmH2O  Pressure Support 6 cmH20  FiO2 (%) 70 %  Minute Ventilation 16.9  Leak 40  Peak Inspiratory Pressure (PIP) 13  Patient Home Equipment No  Auto Titrate No  Press High Alarm 25 cmH2O  Press Low Alarm 5 cmH2O  Nasal massage performed Yes  CPAP/SIPAP surface wiped down Yes  Safety Check Completed by RT for Home Unit Yes, no issues noted

## 2023-04-28 NOTE — Progress Notes (Signed)
ANTICOAGULATION CONSULT NOTE  Pharmacy Consult for Heparin  Indication: atrial fibrillation  Allergies  Allergen Reactions   Aspirin Other (See Comments)    Stomach ulcers. No high doses   Nsaids Other (See Comments)    CAUSES STOMACH ULCERS     Patient Measurements: Height: 6\' 1"  (185.4 cm) Weight: 89.6 kg (197 lb 8.5 oz) IBW/kg (Calculated) : 79.9 Heparin weight: 89.6 kg  Vital Signs: Temp: 97.9 F (36.6 C) (08/08 1459) Temp Source: Oral (08/08 1459) BP: 117/83 (08/08 2000) Pulse Rate: 117 (08/08 2000)  Labs: Recent Labs    04/27/23 0020 04/28/23 0031 04/28/23 0032 04/28/23 0454 04/28/23 1102 04/28/23 1951  HGB 14.0 14.3  --  12.4*  --   --   HCT 41.4 42.0  --  37.6*  --   --   PLT 237  --   --  187  --   --   LABPROT 19.6*  --   --   --   --   --   INR 1.6*  --   --   --   --   --   HEPARINUNFRC  --   --   --   --  <0.10* 0.20*  CREATININE 5.87*  --  6.60* 5.76*  --   --   CKTOTAL  --   --   --  4,057*  --   --     Estimated Creatinine Clearance: 13.5 mL/min (A) (by C-G formula based on SCr of 5.76 mg/dL (H)).   Medical History: Past Medical History:  Diagnosis Date   Adrenal adenoma, left    Arthritis    Bursitis of elbow    Complication of anesthesia    ANESTHESIA DID NOT TAKE EFFECT WITH BACK SURGERY "I FELT THEM CUT"   Congenital absence of right kidney    Coronary artery disease    Diverticulosis of colon    Family history of anesthesia complication    "hard time waking up my dad" many yrs ago   Frequency of urination    Full dentures    GAD (generalized anxiety disorder)    GERD (gastroesophageal reflux disease)    History of acute renal failure    12-16-2013;  09-24-2017 due to stone obstruction   History of duodenal ulcer 04/2000   w/ gi bleed post egd w/ cauterization (caused by chronic nsaid use)   History of encephalopathy 04/26/2014   ACUTE EPISODE SECONDARY TO DRUG OVERDOSE (NARC, BZD, FLEXERIL)   History of GI bleed    08/ 2001    duodenal ulcer bleed post egd w/ cauterization   History of kidney stones    History of prostatitis    Hyperlipidemia    Hypertension    Left bundle branch block 05/05/2021   Left ureteral stone    Nocturia more than twice per night    Peripheral neuropathy    bilateral leg numbness due to back issues, walks w/ cane   Pneumonia    PONV (postoperative nausea and vomiting)    Restless leg syndrome    Solitary left kidney    Wears glasses     Assessment: 72 y/o M with COVID-19, acute on chronic kidney failure, and new onset afib. No anticoagulation prior to admission. Pharmacy consulted for heparin dosing.   HL 0.2 (subtherapeutic) following 2000 unit bolus and rate increase to 1600 units/hr. No pauses or interruptions to infusion and no bleeding reported per d/w RN.   Goal of Therapy:  Heparin level 0.3-0.7 units/ml  Monitor platelets by anticoagulation protocol: Yes   Plan:  Increase heparin drip to 1800 units/hr 8h heparin level  Daily CBC/Heparin level Monitor for bleeding  Thank you for allowing pharmacy to participate in this patient's care.  Loralee Pacas, PharmD, BCPS 04/28/2023,8:35 PM  Please check AMION for all [facility] Pharmacy phone numbers After 10:00 PM, call Main Pharmacy [phone]

## 2023-04-28 NOTE — Progress Notes (Signed)
ANTICOAGULATION CONSULT NOTE - Initial Consult  Pharmacy Consult for Heparin  Indication: atrial fibrillation  Allergies  Allergen Reactions   Aspirin Other (See Comments)    Stomach ulcers. No high doses   Nsaids Other (See Comments)    CAUSES STOMACH ULCERS     Patient Measurements: Height: 6\' 1"  (185.4 cm) Weight: 89.6 kg (197 lb 8.5 oz) IBW/kg (Calculated) : 79.9  Vital Signs: Temp: 99.9 F (37.7 C) (08/07 2315) BP: 146/94 (08/08 0245) Pulse Rate: 121 (08/08 0245)  Labs: Recent Labs    04/27/23 0020 04/28/23 0031 04/28/23 0032  HGB 14.0 14.3  --   HCT 41.4 42.0  --   PLT 237  --   --   LABPROT 19.6*  --   --   INR 1.6*  --   --   CREATININE 5.87*  --  6.60*    Estimated Creatinine Clearance: 11.8 mL/min (A) (by C-G formula based on SCr of 6.6 mg/dL (H)).   Medical History: Past Medical History:  Diagnosis Date   Adrenal adenoma, left    Arthritis    Bursitis of elbow    Complication of anesthesia    ANESTHESIA DID NOT TAKE EFFECT WITH BACK SURGERY "I FELT THEM CUT"   Congenital absence of right kidney    Coronary artery disease    Diverticulosis of colon    Family history of anesthesia complication    "hard time waking up my dad" many yrs ago   Frequency of urination    Full dentures    GAD (generalized anxiety disorder)    GERD (gastroesophageal reflux disease)    History of acute renal failure    12-16-2013;  09-24-2017 due to stone obstruction   History of duodenal ulcer 04/2000   w/ gi bleed post egd w/ cauterization (caused by chronic nsaid use)   History of encephalopathy 04/26/2014   ACUTE EPISODE SECONDARY TO DRUG OVERDOSE (NARC, BZD, FLEXERIL)   History of GI bleed    08/ 2001   duodenal ulcer bleed post egd w/ cauterization   History of kidney stones    History of prostatitis    Hyperlipidemia    Hypertension    Left bundle branch block 05/05/2021   Left ureteral stone    Nocturia more than twice per night    Peripheral neuropathy     bilateral leg numbness due to back issues, walks w/ cane   Pneumonia    PONV (postoperative nausea and vomiting)    Restless leg syndrome    Solitary left kidney    Wears glasses     Assessment: 72 y/o M with COVID-19, acute on chronic kidney failure, and new onset afib. Starting heparin. PTA meds reviewed. Labs above reviewed.   Goal of Therapy:  Heparin level 0.3-0.7 units/ml Monitor platelets by anticoagulation protocol: Yes   Plan:  Heparin 4000 units BOLUS Start heparin drip at 1400 units/hr 1000 Heparin level Daily CBC/Heparin level Monitor for bleeding   Abran Duke, PharmD, BCPS Clinical Pharmacist Phone: [phone]

## 2023-04-28 NOTE — Significant Event (Signed)
Rapid Response Event Note   Reason for Call :  Labored breathing RED MEWS  Initial Focused Assessment:  Patient is fatigued and has increased work of breathing.  He is pale and dusky fingertips, ear lobes.  Lung sounds decreased bases.  Heart tones irregular. He is cool and dry. He is alert and O x2  place and person  BP 129/79  AF 120s  RR 26  O2 sat 93% on 6L [place]  Temp 97.9   CCM consulted:  Dr Celine Mans at bedside  Interventions:  PCXR Orders received for Cardizem gtt and LR bolus  Plan of Care:  RED MEWS protocol RN to call if patient becomes more labored or  has increased O2 needs.   Event Summary:   MD Notified: Dr Lanae Boast Call Time: 1515 Arrival Time: 1520 End Time: 1600  Marcellina Millin, RN

## 2023-04-28 NOTE — Consult Note (Signed)
NAME:  Bradley Morgan, MRN:  [id number], DOB:  [birth year], LOS: 0 ADMISSION DATE:  04/27/2023, CONSULTATION DATE:  04/28/23 REFERRING MD:  Olevia Bowens, TRH, CHIEF COMPLAINT:  Respiratory distress   History of Present Illness:  Bradley Morgan is a 72 yo male with a PMH of CHF (EF 45%), HTN, HLD, CKD3a, and chronic LBBB who presented for progressive AMS, lethargy, and SOB over the last 7 days. Presented to ED and found to be afebrile, tachycardic to 140 with evidence of new-onset afib with RVR, tachypnea to 25, and hypoxemia to 90-93% O2 requiring 3L. WBC 22.6, Cr 6.6, lactic acid 3.2. Patient is COVID+. VBG with normal pH, low CO2, low bicarb. CXR with large consolidation in RLL. CT head normal. He was admitted to progressive on IV azithro and CTX for PNA. Outside window for remdesevir. Rates more reassuring at that time so no rate control given. Once on the floor, rapid was called for worsening respiratory distress now requiring 6L. He remained in afib with RVR. PCCM was ultimately consulted for further recommendations.  Pertinent  Medical History   Past Medical History:  Diagnosis Date   Adrenal adenoma, left    Arthritis    Bursitis of elbow    Complication of anesthesia    ANESTHESIA DID NOT TAKE EFFECT WITH BACK SURGERY "I FELT THEM CUT"   Congenital absence of right kidney    Coronary artery disease    Diverticulosis of colon    Family history of anesthesia complication    "hard time waking up my dad" many yrs ago   Frequency of urination    Full dentures    GAD (generalized anxiety disorder)    GERD (gastroesophageal reflux disease)    History of acute renal failure    12-16-2013;  09-24-2017 due to stone obstruction   History of duodenal ulcer 04/2000   w/ gi bleed post egd w/ cauterization (caused by chronic nsaid use)   History of encephalopathy 04/26/2014   ACUTE EPISODE SECONDARY TO DRUG OVERDOSE (NARC, BZD, FLEXERIL)   History of GI bleed    08/ 2001   duodenal ulcer bleed post  egd w/ cauterization   History of kidney stones    History of prostatitis    Hyperlipidemia    Hypertension    Left bundle branch block 05/05/2021   Left ureteral stone    Nocturia more than twice per night    Peripheral neuropathy    bilateral leg numbness due to back issues, walks w/ cane   Pneumonia    PONV (postoperative nausea and vomiting)    Restless leg syndrome    Solitary left kidney    Wears glasses    Significant Hospital Events: Including procedures, antibiotic start and stop dates in addition to other pertinent events   8/8 - admitted with PNA on IV abx, resp distress worsened requiring 3>6L O2 necessitating PCCM consult  Interim History / Subjective:  Patient feels his breathing has gotten worse over time. He has never been hospitalized for respiratory issues before. Has had PNA in the past without much complication. Does have about 30 pack-year smoking history but has been quit for 26 years. Has never been told he has afib and is not on any medications for this. Wife at bedside who feels he looks better now than when he presented in the ED.  Objective   Blood pressure 129/79, pulse 84, temperature 97.9 F (36.6 C), temperature source Oral, resp. rate (!) 26, height 6\' 1"  (1.854 m),  weight 89.6 kg, SpO2 93%.        Intake/Output Summary (Last 24 hours) at 04/28/2023 1641 Last data filed at 04/28/2023 1238 Gross per 24 hour  Intake 2540.23 ml  Output --  Net 2540.23 ml   Filed Weights   04/28/23 0124  Weight: 89.6 kg    Examination: General: Older male sitting up in bed, intermittently making jokes with team HENT: NCAT, EOM grossly intact, [place] in place Lungs: Tachypneic, lung sounds diminished in R lung base, speaking with 3-4 word dyspnea Cardiovascular: Tachycardic, irregular rhythm, no m/r/g appreciated Abdomen: Nondistended Neuro: Alert, oriented to person, place, city, and year, able to track examiner and respond to questions appropriately  Assessment &  Plan:  Sepsis due to streptococcal pneumonia Acute hypoxemic respiratory failure Acute metabolic encephalopathy COVID-19 History, exam, and suggestive CXR/strep urine antigen studies demonstrate likely cause of sepsis and respiratory failure. AMS has much improved on my exam. COVID-19 could have provided nidus for superimposed bacterial infection; he is outside of the range for remdesevir. -Continue 6L O2 supplementation at this time given stability and improvement in AMS -Will continue CTX but d/c azithro given positive strep antigen -Will add xopenex 0.65 mg Q6H for symptomatic relief  PAF with RVR Chronic LBBB Continues to be in RVR on my exam. Likely contributing to SOB and discomfort. LBBB likely predisposed in setting of LBBB. -Start diltiazem gtt 5 mg hourly, titrate to 15 mg as tolerated -Will need outpatient follow up  AKI on CKD3a On IVF. Renal US with simple L renal cyst, mild amount of echogenic debris in bladder concerning for blood. UA with large Hgb, protein 100, few bacteria, 6-10 WBCs. Consider myoglobin as culprit given recent fatigue and lethargy with dehydration. Sperm was also present on sample which could cause positive heme reaction on dipstick. -Recommend following clinically  Remainder per primary team.  Labs   CBC: Recent Labs  Lab 04/27/23 0020 04/28/23 0031 04/28/23 0454  WBC 22.6*  --  18.9*  NEUTROABS 20.2*  --  17.4*  HGB 14.0 14.3 12.4*  HCT 41.4 42.0 37.6*  MCV 88.1  --  88.7  PLT 237  --  187    Basic Metabolic Panel: Recent Labs  Lab 04/27/23 0020 04/28/23 0031 04/28/23 0032 04/28/23 0454  NA 139 137  --  138  K 3.7 3.6  --  4.1  CL 100  --   --  101  CO2 16*  --   --  18*  GLUCOSE 106*  --   --  110*  BUN 78*  --   --  80*  CREATININE 5.87*  --  6.60* 5.76*  CALCIUM 8.9  --   --  8.6*    Recent Labs  Lab 04/27/23 0020 04/28/23 0032 04/28/23 0131 04/28/23 0454  WBC 22.6*  --   --  18.9*  LATICACIDVEN  --  3.2* 1.9  --      Liver Function Tests: Recent Labs  Lab 04/27/23 0020 04/28/23 0454  AST 60* 75*  ALT 32 31  ALKPHOS 79 89  BILITOT 0.6 0.6  PROT 6.7 6.0*  ALBUMIN 3.0* 2.6*    ABG    Component Value Date/Time   PHART 7.398 04/27/2014 0122   PCO2ART 45.1 (H) 04/27/2014 0122   PO2ART 53.7 (L) 04/27/2014 0122   HCO3 17.7 (L) 04/28/2023 0031   TCO2 19 (L) 04/28/2023 0031   ACIDBASEDEF 7.0 (H) 04/28/2023 0031   O2SAT 83 04/28/2023 0031     Coagulation  Profile: Recent Labs  Lab 04/27/23 0020  INR 1.6*    Cardiac Enzymes: Recent Labs  Lab 04/28/23 0454  CKTOTAL 4,057*    HbA1C: Hgb A1c MFr Bld  Date/Time Value Ref Range Status  12/16/2013 08:34 PM 5.4 <5.7 % Final    Comment:    (NOTE)                                                                       According to the ADA Clinical Practice Recommendations for 2011, when HbA1c is used as a screening test:  >=6.5%   Diagnostic of Diabetes Mellitus           (if abnormal result is confirmed) 5.7-6.4%   Increased risk of developing Diabetes Mellitus References:Diagnosis and Classification of Diabetes Mellitus,Diabetes Care,2011,34(Suppl 1):S62-S69 and Standards of Medical Care in         Diabetes - 2011,Diabetes Care,2011,34 (Suppl 1):S11-S61.    Review of Systems:   Per HPI  Past Medical History:  He,  has a past medical history of Adrenal adenoma, left, Arthritis, Bursitis of elbow, Complication of anesthesia, Congenital absence of right kidney, Coronary artery disease, Diverticulosis of colon, Family history of anesthesia complication, Frequency of urination, Full dentures, GAD (generalized anxiety disorder), GERD (gastroesophageal reflux disease), History of acute renal failure, History of duodenal ulcer (04/2000), History of encephalopathy (04/26/2014), History of GI bleed, History of kidney stones, History of prostatitis, Hyperlipidemia, Hypertension, Left bundle branch block (05/05/2021), Left ureteral stone, Nocturia  more than twice per night, Peripheral neuropathy, Pneumonia, PONV (postoperative nausea and vomiting), Restless leg syndrome, Solitary left kidney, and Wears glasses.   Surgical History:   Past Surgical History:  Procedure Laterality Date   ANTERIOR CERVICAL DECOMP/DISCECTOMY FUSION  05-21-2009   dr Marnee Spring [facility]   C5-6, C6-7 and aviator plate   CARDIOVASCULAR STRESS TEST  12/17/2013   FIXED DEFECT IN THE INFEROSEPTAL REGION BUT NO STRESS INDUCED ISCHEMIA/  NORMAL LV FUNCTION AND WALL MOTION , EF 63%   CLOSED MANIPULATION POST TOTAL KNEE ARTHROPLASTY Left 11-28-2001    dr Lequita Halt   CYSTOSCOPY WITH RETROGRADE PYELOGRAM, URETEROSCOPY AND STENT PLACEMENT Left 10/07/2017   Procedure: CYSTOSCOPY WITH LEFT RETROGRADE PYELOGRAM, BASKETRY OF STONE, URETEROSCOPY AND STENT REMOVAL;  Surgeon: Sebastian Ache, MD;  Location: [facility];  Service: Urology;  Laterality: Left;   CYSTOSCOPY WITH STENT PLACEMENT Left 09/24/2017   Procedure: RENAL CYSTOSCOPY LEFT RETROGRADE, LEFT URETERAL STENT PLACEMENT;  Surgeon: Sebastian Ache, MD;  Location: [facility] OR;  Service: Urology;  Laterality: Left;   HIP ARTHROSCOPY W/ LABRAL DEBRIDEMENT Left 08/12/2006   dr Lequita Halt [facility]   and chondraplasty   INCISION AND DRAINAGE ABSCESS Right 03/06/2015   Procedure: INCISION AND DRAINAGE ABSCESS RIGHT ARM;  Surgeon: Cindee Salt, MD;  Location: [facility];  Service: Orthopedics;  Laterality: Right;   INGUINAL HERNIA REPAIR Right 01/25/2022   Procedure: OPEN RIGHT INGUINAL HERNIA REPAIR WITH MESH;  Surgeon: Violeta Gelinas, MD;  Location: [facility] OR;  Service: General;  Laterality: Right;   INSERTION OF MESH N/A 08/03/2021   Procedure: INSERTION OF MESH;  Surgeon: Violeta Gelinas, MD;  [facility] OR;  Service: General;  Laterality: N/A;   INSERTION OF MESH Right 01/25/2022  Procedure: INSERTION OF MESH;  Surgeon: Violeta Gelinas, MD;  Location: [facility] OR;  Service: General;  Laterality: Right;   KNEE ARTHROSCOPY Left x2   2002   LUMBAR SPINE SURGERY  x5  last one 2002   SHOULDER ARTHROSCOPY WITH SUBACROMIAL DECOMPRESSION, ROTATOR CUFF REPAIR AND BICEP TENDON REPAIR Right 06-27-2002  dr Lequita Halt [facility]   labral debridement and DCR   SHOULDER OPEN ROTATOR CUFF REPAIR Left 07/11/2014   Procedure: ROTATOR CUFF REPAIR SHOULDER OPEN WITH  GRAFT ;  Surgeon: Jacki Cones, MD;  Location: [facility];  Service: Orthopedics;  Laterality: Left;   SUPRA-UMBILICAL HERNIA N/A 08/03/2021   Procedure: SUPRA-UMBILICAL HERNIA REPAIR;  Surgeon: Violeta Gelinas, MD;  Location: [facility] OR;  Service: General;  Laterality: N/A;   TOTAL HIP ARTHROPLASTY Left 10/02/2018   Procedure: TOTAL HIP ARTHROPLASTY ANTERIOR APPROACH;  Surgeon: Ollen Gross, MD;  Location: [facility];  Service: Orthopedics;  Laterality: Left;    TOTAL HIP ARTHROPLASTY Right 07/30/2019   Procedure: TOTAL HIP ARTHROPLASTY ANTERIOR APPROACH;  Surgeon: Ollen Gross, MD;  Location: [facility];  Service: Orthopedics;  Laterality: Right;    TOTAL KNEE ARTHROPLASTY Left 10-09-2001   dr Lequita Halt [facility]   TOTAL KNEE REVISION Left 08/03/2017   Procedure: Left knee tibia polyethylene and patella revision;  Surgeon: Ollen Gross, MD;  Location: [facility];  Service: Orthopedics;  Laterality: Left;  with abductor block     Social History:   reports that he quit smoking about 24 years ago. His smoking use included cigarettes. He started smoking about 39 years ago. He has never used smokeless tobacco. He reports that he does not drink alcohol and does not use drugs.   Family History:  His family history includes Anxiety disorder in his mother; Diabetes in his father; Heart failure in his father; Hypertension in his father.   Allergies Allergies  Allergen Reactions   Aspirin Other (See Comments)    Stomach ulcers. No high doses   Nsaids Other (See Comments)    CAUSES STOMACH ULCERS      Home Medications  Prior to Admission medications   Medication Sig Start Date End Date  Taking? Authorizing Provider  aspirin EC 81 MG tablet Take 81 mg by mouth daily. Swallow whole.   Yes [provider]  carvedilol (COREG) 3.125 MG tablet Take 3.125 mg by mouth 2 (two) times daily with a meal.    Yes [provider]  clonazePAM (KLONOPIN) 0.5 MG tablet Take 0.5 mg by mouth 3 (three) times daily as needed for anxiety.   Yes [provider]  cyclobenzaprine (FLEXERIL) 10 MG tablet Take 1 tablet (10 mg total) 3 (three) times daily as needed by mouth for muscle spasms. 08/04/17  Yes [name], [name], PA-C  gabapentin (NEURONTIN) 300 MG capsule Take 300 mg by mouth 2 (two) times daily.   Yes [provider]  HYDROcodone-acetaminophen (NORCO/VICODIN) 5-325 MG tablet Take 1 tablet by mouth every 6 (six) hours as needed for severe pain or moderate pain. 04/27/23  Yes [provider]  losartan (COZAAR) 25 MG tablet Take 1 tablet (25 mg total) by mouth daily. 03/23/23 06/21/23 Yes [name], MD  pantoprazole (PROTONIX) 40 MG tablet Take 40 mg by mouth 2 (two) times daily.   Yes [provider]  rOPINIRole (REQUIP) 1 MG tablet Take 1 mg by mouth at bedtime.   Yes [provider]  simvastatin (ZOCOR) 40 MG tablet Take 1 tablet by mouth daily. 04/08/23  Yes  [provider]     Critical care time:     Janeal Holmes, MD

## 2023-04-29 ENCOUNTER — Inpatient Hospital Stay (HOSPITAL_COMMUNITY): Payer: Medicare HMO

## 2023-04-29 DIAGNOSIS — N179 Acute kidney failure, unspecified: Secondary | ICD-10-CM | POA: Diagnosis not present

## 2023-04-29 DIAGNOSIS — J9601 Acute respiratory failure with hypoxia: Secondary | ICD-10-CM | POA: Diagnosis not present

## 2023-04-29 DIAGNOSIS — I4891 Unspecified atrial fibrillation: Secondary | ICD-10-CM

## 2023-04-29 DIAGNOSIS — J189 Pneumonia, unspecified organism: Secondary | ICD-10-CM | POA: Diagnosis not present

## 2023-04-29 DIAGNOSIS — J9600 Acute respiratory failure, unspecified whether with hypoxia or hypercapnia: Secondary | ICD-10-CM

## 2023-04-29 DIAGNOSIS — I5022 Chronic systolic (congestive) heart failure: Secondary | ICD-10-CM | POA: Diagnosis not present

## 2023-04-29 LAB — BLOOD GAS, ARTERIAL
Acid-base deficit: 9.1 mmol/L — ABNORMAL HIGH (ref 0.0–2.0)
Bicarbonate: 14.9 mmol/L — ABNORMAL LOW (ref 20.0–28.0)
O2 Saturation: 98.9 %
Patient temperature: 37.3
pCO2 arterial: 27 mmHg — ABNORMAL LOW (ref 32–48)
pH, Arterial: 7.35 (ref 7.35–7.45)
pO2, Arterial: 101 mmHg (ref 83–108)

## 2023-04-29 LAB — PROCALCITONIN: Procalcitonin: 44.41 ng/mL

## 2023-04-29 LAB — HEPARIN LEVEL (UNFRACTIONATED): Heparin Unfractionated: 0.28 IU/mL — ABNORMAL LOW (ref 0.30–0.70)

## 2023-04-29 LAB — MRSA NEXT GEN BY PCR, NASAL: MRSA by PCR Next Gen: NOT DETECTED

## 2023-04-29 LAB — CK: Total CK: 2671 U/L — ABNORMAL HIGH (ref 49–397)

## 2023-04-29 LAB — GLUCOSE, CAPILLARY: Glucose-Capillary: 121 mg/dL — ABNORMAL HIGH (ref 70–99)

## 2023-04-29 MED ORDER — AMIODARONE HCL IN DEXTROSE 360-4.14 MG/200ML-% IV SOLN
INTRAVENOUS | Status: AC
  Filled 2023-04-29: qty 200

## 2023-04-29 MED ORDER — CHLORHEXIDINE GLUCONATE CLOTH 2 % EX PADS
6.0000 | MEDICATED_PAD | Freq: Every day | CUTANEOUS | Status: DC
  Administered 2023-04-30 – 2023-05-05 (×8): 6 via TOPICAL

## 2023-04-29 MED ORDER — AMIODARONE IV BOLUS ONLY 150 MG/100ML
150.0000 mg | Freq: Once | INTRAVENOUS | Status: AC
  Administered 2023-04-29: 150 mg via INTRAVENOUS

## 2023-04-29 MED ORDER — HEPARIN (PORCINE) 25000 UT/250ML-% IV SOLN
1750.0000 [IU]/h | INTRAVENOUS | Status: DC
  Administered 2023-04-29 – 2023-05-01 (×5): 2200 [IU]/h via INTRAVENOUS
  Administered 2023-05-02 – 2023-05-03 (×2): 2100 [IU]/h via INTRAVENOUS
  Administered 2023-05-03 – 2023-05-04 (×3): 1850 [IU]/h via INTRAVENOUS
  Administered 2023-05-05 – 2023-05-06 (×2): 1750 [IU]/h via INTRAVENOUS
  Filled 2023-04-29 (×13): qty 250

## 2023-04-29 MED ORDER — LACTATED RINGERS IV SOLN: INTRAVENOUS | Status: DC

## 2023-04-29 MED ORDER — AMIODARONE HCL IN DEXTROSE 360-4.14 MG/200ML-% IV SOLN
60.0000 mg/h | INTRAVENOUS | Status: AC
  Administered 2023-04-29 (×2): 60 mg/h via INTRAVENOUS
  Filled 2023-04-29: qty 200

## 2023-04-29 MED ORDER — AMIODARONE HCL IN DEXTROSE 360-4.14 MG/200ML-% IV SOLN
30.0000 mg/h | INTRAVENOUS | Status: DC
  Administered 2023-04-30: 30 mg/h via INTRAVENOUS
  Filled 2023-04-29: qty 200

## 2023-04-29 MED ORDER — SODIUM BICARBONATE 650 MG PO TABS
650.0000 mg | ORAL_TABLET | Freq: Two times a day (BID) | ORAL | Status: DC
  Administered 2023-04-29 – 2023-04-30 (×3): 650 mg via ORAL
  Filled 2023-04-29 (×3): qty 1

## 2023-04-29 NOTE — Consult Note (Addendum)
Cardiology Consultation   Patient ID: Bradley Morgan MRN: [id number]; DOB: 28-May-1951  Admit date: 04/27/2023 Date of Consult: 04/29/2023  PCP:  Barbie Banner, MD   [facility] Providers Cardiologist:  Donato Schultz, MD        Patient Profile:   Bradley Morgan is a 72 y.o. male with a hx of HTN, HLD, CKD stage III, h/o snoring refused sleep study, LBBB, chronic systolic CHF and former smoker who is being seen 04/29/2023 for the evaluation of atrial fibrillation with RVR and worsening LV EF at the request of Dr. Celine Mans.  History of Present Illness:   Bradley Morgan is a 72 year old male with past medical history of HTN, HLD, CKD stage III, congenital absence of R kidney, h/o snoring refused sleep study, LBBB, chronic systolic CHF and former smoker.  Previous echocardiogram obtained on 06/18/2021 showed EF 44%, grade 1 DD, global hypokinesis, mild to moderate AI.  Subsequent Myoview obtained on 06/19/2021 came back intermediate risk, reduced perfusion in the mid apical inferior myocardium that is slightly worse at stress than rest consistent with infarct with peri-infarct ischemia, however this was also noted on previous stress imaging in 2015, wall motion abnormality was more consistent with left bundle branch block rather than infarct.  Suspect this is related to diaphragmatic attenuation artifact rather than true infarct.  Patient was most recently seen by Dr. Anne Fu on 02/10/2023 at which time he was doing well but complains of fatigue.  He was admitted to [facility] on 04/27/2023 after presenting with confusion.  Family has noted cough and congestion for few days and the the patient has been too weak to get out of the bed.  Family also reported a fall at home.  He was treated with IV fluid.  Initial lactic acid was 3.2, creatinine has increased from the baseline of 1.65 in May 2023 to 5.87 on arrival.  White blood cell count 22.6.  CK elevated at 4057, repeat CK improved to 2671.   Procalcitonin elevated at 44.  COVID test came back positive.  Strep pneumo urinary antigen came back positive.  Blood culture negative to date.  Due to the recent fall and the possibly hitting his head, a CT of the head was obtained which showed chronic white matter small ischemic changes but no acute finding.  Chest x-ray showed consolidation in the right middle and lower lobe suspicious for pneumonia.  EKG shows atrial fibrillation with RVR with underlying left bundle branch block.  Renal ultrasound showed nonvisualization of the right kidney, 1.6 x 1.2 x 1.3 cm simple left renal cyst.  No hydronephrosis.  Urinalysis showed amber urine, large hemoglobin, 100 protein, few bacteria.  Patient was resuscitated with IV fluid and the treated with IV antibiotic.  He was initially placed on IV diltiazem for rate control, however repeat echocardiogram obtained on 04/28/2023 showed EF 25 to 30%, spontaneous echo contrast noted in the LV concerning for low-flow state, septal/lateral dyssynchrony, moderate LAE, trivial MR, small pericardial effusion, mild AI.  IV diltiazem was discontinued due to low ejection fraction.  Patient has been placed on 6.25 mg twice a day of carvedilol and IV amiodarone.   Note, patient is mumbling a lot during intervention, although able to understand majority of conversation, however had difficulty verify some information.   Past Medical History:  Diagnosis Date   Adrenal adenoma, left    Arthritis    Bursitis of elbow    Complication of anesthesia    ANESTHESIA  DID NOT TAKE EFFECT WITH BACK SURGERY "I FELT THEM CUT"   Congenital absence of right kidney    Coronary artery disease    Diverticulosis of colon    Family history of anesthesia complication    "hard time waking up my dad" many yrs ago   Frequency of urination    Full dentures    GAD (generalized anxiety disorder)    GERD (gastroesophageal reflux disease)    History of acute renal failure    12-16-2013;  09-24-2017  due to stone obstruction   History of duodenal ulcer 04/2000   w/ gi bleed post egd w/ cauterization (caused by chronic nsaid use)   History of encephalopathy 04/26/2014   ACUTE EPISODE SECONDARY TO DRUG OVERDOSE (NARC, BZD, FLEXERIL)   History of GI bleed    08/ 2001   duodenal ulcer bleed post egd w/ cauterization   History of kidney stones    History of prostatitis    Hyperlipidemia    Hypertension    Left bundle branch block 05/05/2021   Left ureteral stone    Nocturia more than twice per night    Peripheral neuropathy    bilateral leg numbness due to back issues, walks w/ cane   Pneumonia    PONV (postoperative nausea and vomiting)    Restless leg syndrome    Solitary left kidney    Wears glasses     Past Surgical History:  Procedure Laterality Date   ANTERIOR CERVICAL DECOMP/DISCECTOMY FUSION  05-21-2009   dr Marnee Spring [facility]   C5-6, C6-7 and aviator plate   CARDIOVASCULAR STRESS TEST  12/17/2013   FIXED DEFECT IN THE INFEROSEPTAL REGION BUT NO STRESS INDUCED ISCHEMIA/  NORMAL LV FUNCTION AND WALL MOTION , EF 63%   CLOSED MANIPULATION POST TOTAL KNEE ARTHROPLASTY Left 11-28-2001    dr Lequita Halt   CYSTOSCOPY WITH RETROGRADE PYELOGRAM, URETEROSCOPY AND STENT PLACEMENT Left 10/07/2017   Procedure: CYSTOSCOPY WITH LEFT RETROGRADE PYELOGRAM, BASKETRY OF STONE, URETEROSCOPY AND STENT REMOVAL;  Surgeon: Sebastian Ache, MD;  Location: [facility];  Service: Urology;  Laterality: Left;   CYSTOSCOPY WITH STENT PLACEMENT Left 09/24/2017   Procedure: RENAL CYSTOSCOPY LEFT RETROGRADE, LEFT URETERAL STENT PLACEMENT;  Surgeon: Sebastian Ache, MD;  Location: [facility] OR;  Service: Urology;  Laterality: Left;   HIP ARTHROSCOPY W/ LABRAL DEBRIDEMENT Left 08/12/2006   dr Lequita Halt [facility]   and chondraplasty   INCISION AND DRAINAGE ABSCESS Right 03/06/2015   Procedure: INCISION AND DRAINAGE ABSCESS RIGHT ARM;  Surgeon: Cindee Salt, MD;  Location: [facility];  Service: Orthopedics;   Laterality: Right;   INGUINAL HERNIA REPAIR Right 01/25/2022   Procedure: OPEN RIGHT INGUINAL HERNIA REPAIR WITH MESH;  Surgeon: Violeta Gelinas, MD;  Location[facility] OR;  Service: General;  Laterality: Right;   INSERTION OF MESH N/A 08/03/2021   Procedure: INSERTION OF MESH;  Surgeon: Violeta Gelinas, MD;  Location: [facility] OR;  Service: General;  Laterality: N/A;   INSERTION OF MESH Right 01/25/2022   Procedure: INSERTION OF MESH;  Surgeon: Violeta Gelinas, MD;  Location: [facility] OR;  Service: General;  Laterality: Right;   KNEE ARTHROSCOPY Left x2  2002   LUMBAR SPINE SURGERY  x5  last one 2002   SHOULDER ARTHROSCOPY WITH SUBACROMIAL DECOMPRESSION, ROTATOR CUFF REPAIR AND BICEP TENDON REPAIR Right 06-27-2002  dr Lequita Halt [facility]   labral debridement and DCR   SHOULDER OPEN ROTATOR CUFF REPAIR Left 07/11/2014   Procedure: ROTATOR CUFF REPAIR SHOULDER OPEN WITH  GRAFT ;  Surgeon: Jacki Cones, MD;  Location: [facility];  Service: Orthopedics;  Laterality: Left;   SUPRA-UMBILICAL HERNIA N/A 08/03/2021   Procedure: SUPRA-UMBILICAL HERNIA REPAIR;  Surgeon: Violeta Gelinas, MD;  Location: [facility] OR;  Service: General;  Laterality: N/A;   TOTAL HIP ARTHROPLASTY Left 10/02/2018   Procedure: TOTAL HIP ARTHROPLASTY ANTERIOR APPROACH;  Surgeon: Ollen Gross, MD;  Location: [facility];  Service: Orthopedics;  Laterality: Left;    TOTAL HIP ARTHROPLASTY Right 07/30/2019   Procedure: TOTAL HIP ARTHROPLASTY ANTERIOR APPROACH;  Surgeon: Ollen Gross, MD;  Location: [facility];  Service: Orthopedics;  Laterality: Right;    TOTAL KNEE ARTHROPLASTY Left 10-09-2001   dr Lequita Halt [facility]   TOTAL KNEE REVISION Left 08/03/2017   Procedure: Left knee tibia polyethylene and patella revision;  Surgeon: Ollen Gross, MD;  Location: [facility];  Service: Orthopedics;  Laterality: Left;  with abductor block     Home Medications:  Prior to Admission medications   Medication Sig Start Date End Date Taking? Authorizing Provider  aspirin EC  81 MG tablet Take 81 mg by mouth daily. Swallow whole.   Yes [provider]  carvedilol (COREG) 3.125 MG tablet Take 3.125 mg by mouth 2 (two) times daily with a meal.    Yes [provider]  clonazePAM (KLONOPIN) 0.5 MG tablet Take 0.5 mg by mouth 3 (three) times daily as needed for anxiety.   Yes [provider]  cyclobenzaprine (FLEXERIL) 10 MG tablet Take 1 tablet (10 mg total) 3 (three) times daily as needed by mouth for muscle spasms. 08/04/17  Yes [name], [name], PA-C  gabapentin (NEURONTIN) 300 MG capsule Take 300 mg by mouth 2 (two) times daily.   Yes [provider]  HYDROcodone-acetaminophen (NORCO/VICODIN) 5-325 MG tablet Take 1 tablet by mouth every 6 (six) hours as needed for severe pain or moderate pain. 04/27/23  Yes [provider]  losartan (COZAAR) 25 MG tablet Take 1 tablet (25 mg total) by mouth daily. 03/23/23 06/21/23 Yes [name], MD  pantoprazole (PROTONIX) 40 MG tablet Take 40 mg by mouth 2 (two) times daily.   Yes [provider]  rOPINIRole (REQUIP) 1 MG tablet Take 1 mg by mouth at bedtime.   Yes [provider]  simvastatin (ZOCOR) 40 MG tablet Take 1 tablet by mouth daily. 04/08/23  Yes [provider]    Inpatient Medications: Scheduled Meds:  aspirin EC  81 mg Oral Daily   carvedilol  6.25 mg Oral BID WC   [START ON 04/30/2023] Chlorhexidine Gluconate Cloth  6 each Topical Daily   dexamethasone  6 mg Oral Daily   gabapentin  100 mg Oral BID   levalbuterol  0.63 mg Nebulization Q6H   pantoprazole  40 mg Oral BID   rOPINIRole  1 mg Oral QHS   sodium bicarbonate  650 mg Oral BID   Continuous Infusions:  amiodarone 60 mg/hr (04/29/23 1128)   amiodarone     cefTRIAXone (ROCEPHIN)  IV 2 g (04/28/23 1803)   heparin 2,000 Units/hr (04/29/23 0759)   PRN Meds: acetaminophen, clonazePAM, HYDROcodone-acetaminophen, levalbuterol  Allergies:    Allergies  Allergen Reactions   Aspirin  Other (See Comments)    Stomach ulcers. No high doses   Nsaids Other (See Comments)    CAUSES STOMACH ULCERS     Social History:   Social History   Socioeconomic History   Marital status: Married    Spouse name: Not on file   Number of  children: Not on file   Years of education: Not on file   Highest education level: Not on file  Occupational History   Not on file  Tobacco Use   Smoking status: Former    Current packs/day: 0.00    Types: Cigarettes    Start date: 10/10/1983    Quit date: 10/09/1998    Years since quitting: 24.5   Smokeless tobacco: Never  Vaping Use   Vaping status: Never Used  Substance and Sexual Activity   Alcohol use: No   Drug use: No   Sexual activity: Not on file  Other Topics Concern   Not on file  Social History Narrative   Not on file   Social Determinants of Health   Financial Resource Strain: Not on file  Food Insecurity: Not on file  Transportation Needs: Not on file  Physical Activity: Not on file  Stress: Not on file  Social Connections: Not on file  Intimate Partner Violence: Not on file    Family History:    Family History  Problem Relation Age of Onset   Heart failure Father    Diabetes Father    Hypertension Father    Anxiety disorder Mother        also tended to overdose on anxiety medication     ROS:  Please see the history of present illness.   All other ROS reviewed and negative.     Physical Exam/Data:   Vitals:   04/29/23 0959 04/29/23 1100 04/29/23 1102 04/29/23 1200  BP: 128/82   101/79  Pulse: (!) 133   (!) 101  Resp:    (!) 25  Temp: 97.9 F (36.6 C) 98.6 F (37 C)    TempSrc:  Oral    SpO2:   92% 93%  Weight:  92.4 kg    Height:  6\' 1"  (1.854 m)      Intake/Output Summary (Last 24 hours) at 04/29/2023 1228 Last data filed at 04/29/2023 0647 Gross per 24 hour  Intake 169.83 ml  Output 400 ml  Net -230.17 ml      04/29/2023   11:00 AM 04/29/2023    4:29 AM 04/28/2023    1:24 AM  Last 3 Weights   Weight (lbs) 203 lb 11.3 oz 205 lb 4 oz 197 lb 8.5 oz  Weight (kg) 92.4 kg 93.1 kg 89.6 kg     Body mass index is 26.88 kg/m.  General:  sitting up in bed, on high flow oxygen HEENT: normal Neck: no JVD Vascular: No carotid bruits; Distal pulses 2+ bilaterally Cardiac:  normal S1, S2; RRR; no murmur  Lungs: left lung clear, diminished breath sound in the right lung.  Abd: soft, nontender, no hepatomegaly  Ext: no edema Musculoskeletal:  No deformities, BUE and BLE strength normal and equal Skin: warm and dry  Neuro:  CNs 2-12 intact, no focal abnormalities noted Psych:  Normal affect   EKG:  The EKG was personally reviewed and demonstrates:  atrial fibrillation with RVR, LBBB Telemetry:  Telemetry was personally reviewed and demonstrates:  atrial fibrillation, HR improved to 80-90s  Relevant CV Studies:  Echo 06/18/2021  1. Left ventricular ejection fraction by 3D volume is 44 %. The left  ventricle has mild to moderately decreased function. The left ventricle  demonstrates global hypokinesis. Left ventricular diastolic parameters are  consistent with Grade I diastolic  dysfunction (impaired relaxation).   2. Right ventricular systolic function is normal. The right ventricular  size is normal.  3. The mitral valve is normal in structure. No evidence of mitral valve  regurgitation. No evidence of mitral stenosis.   4. The aortic valve is normal in structure. Aortic valve regurgitation is  mild to moderate. Mild aortic valve sclerosis is present, with no evidence  of aortic valve stenosis.   5. The inferior vena cava is normal in size with greater than 50%  respiratory variability, suggesting right atrial pressure of 3 mmHg.    Echo 04/28/2023 1. Spontaneous echo contrast noted in LV, concerning for low flow state.  Left ventricular ejection fraction, by estimation, is 25-30% with  septal/lateral dyssynchrony. The left ventricle has severely decreased  function. The left  ventricle demonstrates  global hypokinesis. There is moderate left ventricular hypertrophy. Left  ventricular diastolic parameters are indeterminate.   2. Right ventricular systolic function is mildly reduced. The right  ventricular size is normal.   3. Left atrial size was moderately dilated.   4. A small pericardial effusion is present.   5. The mitral valve is grossly normal. Trivial mitral valve  regurgitation. No evidence of mitral stenosis.   6. The aortic valve is grossly normal. There is mild thickening of the  aortic valve. Aortic valve regurgitation is mild. No aortic stenosis is  present.   7. IVC not well visualized.   Laboratory Data:  High Sensitivity Troponin:  No results for input(s): "TROPONINIHS" in the last 720 hours.   Chemistry Recent Labs  Lab 04/27/23 0020 04/28/23 0031 04/28/23 0032 04/28/23 0454 04/28/23 2117 04/29/23 0638  NA 139 137  --  138  --  134*  K 3.7 3.6  --  4.1  --  4.2  CL 100  --   --  101  --  99  CO2 16*  --   --  18*  --  14*  GLUCOSE 106*  --   --  110*  --  112*  BUN 78*  --   --  80*  --  100*  CREATININE 5.87*  --  6.60* 5.76*  --  6.21*  CALCIUM 8.9  --   --  8.6*  --  8.1*  MG  --   --   --   --  2.1  --   GFRNONAA 10*  --   --  10*  --  9*  ANIONGAP 23*  --   --  19*  --  21*    Recent Labs  Lab 04/27/23 0020 04/28/23 0454 04/29/23 0638  PROT 6.7 6.0* 5.8*  ALBUMIN 3.0* 2.6* 2.3*  AST 60* 75* 61*  ALT 32 31 33  ALKPHOS 79 89 76  BILITOT 0.6 0.6 0.6   Lipids No results for input(s): "CHOL", "TRIG", "HDL", "LABVLDL", "LDLCALC", "CHOLHDL" in the last 168 hours.  Hematology Recent Labs  Lab 04/27/23 0020 04/28/23 0031 04/28/23 0454 04/29/23 0638  WBC 22.6*  --  18.9* 22.0*  RBC 4.70  --  4.24 4.58  HGB 14.0 14.3 12.4* 13.5  HCT 41.4 42.0 37.6* 38.7*  MCV 88.1  --  88.7 84.5  MCH 29.8  --  29.2 29.5  MCHC 33.8  --  33.0 34.9  RDW 14.7  --  14.9 15.1  PLT 237  --  187 270   Thyroid No results for input(s):  "TSH", "FREET4" in the last 168 hours.  BNPNo results for input(s): "BNP", "PROBNP" in the last 168 hours.  DDimer No results for input(s): "DDIMER" in the last 168 hours.  Radiology/Studies:  [facility] Chest Port 1V same Day  Result Date: 04/29/2023 CLINICAL DATA:  72 year old male with shortness of breath. EXAM: PORTABLE CHEST 1 VIEW COMPARISON:  Portable chest 04/28/2023 and earlier. FINDINGS: Portable AP semi upright view at 0652 hours. Ongoing low lung volumes. Stable mediastinal contours with suspicion of cardiomegaly. Visualized tracheal air column is within normal limits. Asymmetry of the diaphragm which might reflect mild elevation on the right side, although small right pleural effusion is difficult to exclude. Confluent medial right lung base airspace disease seen on 04/27/2023 likely on going, with possible air bronchograms along the right heart border. Upper lungs remain clear. No pneumothorax. Stable vascularity without edema. No definite left lung consolidation. Paucity of bowel gas in the upper abdomen. Stable visualized osseous structures. Postoperative changes to the cervical spine and right shoulder. IMPRESSION: 1. Evidence of ongoing right lung base consolidation/pneumonia. Some underlying chronic elevation of the right hemidiaphragm. Small if any right pleural effusion. 2. Low lung volumes with no new cardiopulmonary abnormality identified. Electronically Signed   By: Odessa Fleming M.D.   On: 04/29/2023 07:10   ECHOCARDIOGRAM COMPLETE  Result Date: 04/28/2023    ECHOCARDIOGRAM REPORT   Patient Name:   Bradley Morgan Date of Exam: 04/28/2023 Medical Rec #:  [id number]       Height:       73.0 in Accession #:    [id number]      Weight:       197.5 lb Date of Birth:  03-17-1951       BSA:          2.140 m Patient Age:    72 years        BP:           107/81 mmHg Patient Gender: M               HR:           130 bpm. Exam Location:  Inpatient Procedure: 2D Echo, Cardiac Doppler and Color Doppler  Indications:    I49.9* Cardiac arrhythmia, unspecified  History:        Patient has prior history of Echocardiogram examinations, most                 recent 06/09/2021. Abnormal ECG; Signs/Symptoms:Covid.  Sonographer:    Darlys Gales Referring Phys: Youlanda Roys  Sonographer Comments: Suboptimal parasternal window. IMPRESSIONS  1. Spontaneous echo contrast noted in LV, concerning for low flow state. Left ventricular ejection fraction, by estimation, is 25-30% with septal/lateral dyssynchrony. The left ventricle has severely decreased function. The left ventricle demonstrates global hypokinesis. There is moderate left ventricular hypertrophy. Left ventricular diastolic parameters are indeterminate.  2. Right ventricular systolic function is mildly reduced. The right ventricular size is normal.  3. Left atrial size was moderately dilated.  4. A small pericardial effusion is present.  5. The mitral valve is grossly normal. Trivial mitral valve regurgitation. No evidence of mitral stenosis.  6. The aortic valve is grossly normal. There is mild thickening of the aortic valve. Aortic valve regurgitation is mild. No aortic stenosis is present.  7. IVC not well visualized. FINDINGS  Left Ventricle: Spontaneous echo contrast noted in LV, concerning for low flow state. Left ventricular ejection fraction, by estimation, is 25 to 30%. The left ventricle has severely decreased function. The left ventricle demonstrates global hypokinesis. The left ventricular internal cavity size was normal in size. There is moderate left ventricular hypertrophy. Abnormal (paradoxical) septal motion, consistent with  left bundle branch block. Left ventricular diastolic parameters are indeterminate. Right Ventricle: The right ventricular size is normal. Right vetricular wall thickness was not well visualized. Right ventricular systolic function is mildly reduced. Left Atrium: Left atrial size was moderately dilated. Right Atrium: Right atrial  size was normal in size. Pericardium: A small pericardial effusion is present. Mitral Valve: The mitral valve is grossly normal. Mild mitral annular calcification. Trivial mitral valve regurgitation. No evidence of mitral valve stenosis. Tricuspid Valve: The tricuspid valve is grossly normal. Tricuspid valve regurgitation is mild. Aortic Valve: The aortic valve is grossly normal. There is mild thickening of the aortic valve. Aortic valve regurgitation is mild. No aortic stenosis is present. Pulmonic Valve: The pulmonic valve was not well visualized. Pulmonic valve regurgitation is trivial. Aorta: The aortic root was not well visualized. Venous: IVC not well visualized. The inferior vena cava was not well visualized. IAS/Shunts: The interatrial septum was not well visualized.  LEFT VENTRICLE PLAX 2D LVIDd:         3.50 cm LV PW:         1.20 cm LV IVS:        1.54 cm LVOT diam:     2.00 cm LVOT Area:     3.14 cm  LEFT ATRIUM             Index LA Vol (A2C):   65.3 ml 30.51 ml/m LA Vol (A4C):   56.5 ml 26.40 ml/m LA Biplane Vol: 62.2 ml 29.06 ml/m  TRICUSPID VALVE TR Peak grad:   25.8 mmHg TR Vmax:        254.00 cm/s  SHUNTS Systemic Diam: 2.00 cm Weston Brass MD Electronically signed by Weston Brass MD Signature Date/Time: 04/28/2023/5:41:31 PM    Final    DG Chest Port 1 View  Result Date: 04/28/2023 CLINICAL DATA:  Shortness of breath EXAM: PORTABLE CHEST 1 VIEW COMPARISON:  Chest x-ray 04/27/2023 FINDINGS: The heart is enlarged, unchanged. Small right pleural effusion has decreased. Right basilar infiltrate has resolved. Left lung is clear. No pneumothorax. Osseous structures are stable. IMPRESSION: Small right pleural effusion has decreased. Right basilar infiltrate has resolved. Electronically Signed   By: Darliss Cheney M.D.   On: 04/28/2023 15:46   US RENAL  Result Date: 04/28/2023 CLINICAL DATA:  Acute kidney injury. EXAM: RENAL / URINARY TRACT ULTRASOUND COMPLETE COMPARISON:  None Available.  FINDINGS: Right Kidney: The right kidney is not visualized. Left Kidney: Renal measurements: 11.2 cm x 5.7 cm x 7.4 cm = volume: 245.4 mL. Echogenicity within normal limits. A 1.6 cm x 1.2 cm x 1.3 cm simple cyst is seen within the left kidney. No abnormal flow is noted within this region on color Doppler evaluation. No hydronephrosis is visualized. Bladder: A mild amount of echogenic debris is seen within the dependent portion of the gallbladder lumen. Other: None. IMPRESSION: 1. Nonvisualization of the right kidney. 2. 1.6 cm x 1.2 cm x 1.3 cm simple left renal cyst. 3. Mild amount of echogenic debris within the urinary bladder. A small amount of blood products cannot be excluded. Correlation with urinalysis is recommended. Electronically Signed   By: Aram Candela M.D.   On: 04/28/2023 03:16   CT Head Wo Contrast  Result Date: 04/27/2023 CLINICAL DATA:  Altered mental status. EXAM: CT HEAD WITHOUT CONTRAST TECHNIQUE: Contiguous axial images were obtained from the base of the skull through the vertex without intravenous contrast. RADIATION DOSE REDUCTION: This exam was performed according to the departmental dose-optimization program  which includes automated exposure control, adjustment of the mA and/or kV according to patient size and/or use of iterative reconstruction technique. COMPARISON:  April 26, 2014 FINDINGS: Brain: There is mild cerebral atrophy with widening of the extra-axial spaces and ventricular dilatation. There are areas of decreased attenuation within the white matter tracts of the supratentorial brain, consistent with microvascular disease changes. Vascular: No hyperdense vessel or unexpected calcification. Skull: Normal. Negative for fracture or focal lesion. Sinuses/Orbits: There is mild left maxillary sinus mucosal thickening. Other: None. IMPRESSION: 1. Generalized cerebral atrophy with mild, chronic white matter small vessel ischemic changes. 2. No acute intracranial abnormality. 3.  Mild left maxillary sinus disease. Electronically Signed   By: Aram Candela M.D.   On: 04/27/2023 23:58   DG Chest 2 View  Result Date: 04/27/2023 CLINICAL DATA:  Suspected sepsis. EXAM: CHEST - 2 VIEW COMPARISON:  Chest radiograph 10/04/2014. FINDINGS: Dense consolidation in the right lung base likely involving both right middle and lower lobes. Suspected air bronchograms. Mild peribronchial thickening. Streaky atelectasis at the left lung base. Grossly stable heart size and mediastinal contours. No large pleural effusion. No pneumothorax. Cervical spine hardware is partially included. IMPRESSION: Dense consolidation in the right lung base likely involving both right middle and lower lobes, suspicious for pneumonia. Followup PA and lateral chest X-ray is recommended in 3-4 weeks following trial of antibiotic therapy to ensure resolution and exclude underlying malignancy. Electronically Signed   By: Narda Rutherford M.D.   On: 04/27/2023 23:45     Assessment and Plan:   Atrial fibrillation with RVR: Arrived in A-fib with RVR of unknown duration. Denies any CP or palpitation, no obvious cardiac awareness. Initially placed on IV diltiazem, IV diltiazem has been stopped and switched to carvedilol 6.25 mg twice a day and amiodarone therapy.  Blood pressure soft.  Will discuss with MD, potentially cut back on carvedilol dosage and uses amiodarone for rate control. Patient is at high risk for decompensation. Continue IV heparin.   Acute respiratory failure: Former smoker.  Worsening respiratory status with confusion on arrival related to pneumonia and COVID-19 infection.  Followed by pulmonology service.  Procalcitonin elevated at 44.  White blood cell count elevated at 22,000.  Chronic systolic heart failure: Previous echocardiogram obtained in September 2022 showed EF 44%, Myoview at the time suggested perfusion defect was more related to diaphragmatic attenuation artifact and left bundle branch block.   He has been on low-dose 3.125 mg twice a day of carvedilol and low-dose losartan at home.  Patient came in with pneumonia and signs of sepsis, EF down to 25 to 30% with septal/lateral dyssynchrony and the spontaneous echo contrast in the LV concerning for low-flow state.  Likely due to stress from sepsis and A-fib with RVR.  Will discuss with MD, patient seems to be perfusing on physical exam, extremities warm.  No sign of volume overload on physical exam. Elevated total CK on arrival at 4000, improved to 2600 on repeat.   Strep pneumococcal pneumonia: Right middle and lower lobe consolidation seen on chest x-ray.  Strep pneumo urinary antigen came back positive.  Require high flow oxygen.  Followed by pulmonology service.  If deteriorate, may require intubation.  Per patient, his breathing has improved when compared to yesterday.  COVID-19 infection: Was not treated with remdesivir as outside the window of treatment.  Acute on chronic renal insufficiency: Baseline creatinine of 1.6, arrived with creatinine of 5.8.  His creatinine has been around 6 for the past 2 days.  Nephrology on board.    Risk Assessment/Risk Scores:          CHA2DS2-VASc Score = 3   This indicates a 3.2% annual risk of stroke. The patient's score is based upon: CHF History: 1 HTN History: 1 Diabetes History: 0 Stroke History: 0 Vascular Disease History: 0 Age Score: 1 Gender Score: 0         For questions or updates, please contact [facility] Please consult [email or web address] for contact info under    Signed, Azalee Course, [place]  04/29/2023 12:28 PM  Personally seen and examined. Agree with APP above with the following comments:  Briefly 72 yo M with prior history of OSA, HFmrEF and CKD who comes in after recent infection.  CAP PNA and sepsis, rhabdomyolysis, with AF RVR and worsening LV function. Patient notes that he has been feeling sick for the past two weeks. Family notes a week of decrease energy,  cough and weakness.  Found to have no appetite and diaphoresis.  He fell and was sweaty and week.  Found to have COVID and evidence of CAP.  Pro-cal 44.  As part of work up found to have AF RVR and severely reduced LFEV. Presently he is asymptomatic of this, no chest pain, still tired and weak.  Exam notable for HFNC needed.  Acutely ill.  Rhonchi in R side.  No edema, skin is warm.  IRIR tachycardia  EKG AF RVR LBBB Tele: AF RVR, with improved rates Personally reviewed relevant tests;  LVEF ~ 25% with LBBB.  Mild AI.   Would recommend  -Stop coreg - IV amiodarone - IV heparin - if need needs IVF he is perfusing well, ok for IVF - no indications for emergent DCCV.  Riley Lam, MD FASE [facility] Cardiologist [facility]  [address] [place], [address] [place], [place] [address] [phone]  2:40 PM

## 2023-04-29 NOTE — Progress Notes (Signed)
   04/29/23 2004  BiPAP/CPAP/SIPAP  BiPAP/CPAP/SIPAP Pt Type Adult  BiPAP/CPAP/SIPAP V60  Mask Type Full face mask  Mask Size Large  Set Rate 18 breaths/min  Respiratory Rate 31 breaths/min  IPAP 14 cmH20  EPAP 6 cmH2O  Pressure Support 8 cmH20  PEEP 6 cmH20  FiO2 (%) 70 %  Minute Ventilation 17.9  Leak 64  Peak Inspiratory Pressure (PIP) 15  Tidal Volume (Vt) 575  Patient Home Equipment No  Auto Titrate No  Press High Alarm 25 cmH2O  Press Low Alarm 5 cmH2O  Nasal massage performed Yes  CPAP/SIPAP surface wiped down Yes  BiPAP/CPAP /SiPAP Vitals  Bilateral Breath Sounds Clear;Diminished   Pt found in distress and desatting <85% on HFNC.  RT placed pt on bipap and pt's WOB and SpO2 immediately improved.  Pt is resting comfortably on bipap now, RT will continue to monitor

## 2023-04-29 NOTE — Progress Notes (Signed)
ANTICOAGULATION CONSULT NOTE  Pharmacy Consult for Heparin  Indication: atrial fibrillation  Allergies  Allergen Reactions   Aspirin Other (See Comments)    Stomach ulcers. No high doses   Nsaids Other (See Comments)    CAUSES STOMACH ULCERS     Patient Measurements: Height: 6\' 1"  (185.4 cm) Weight: 92.4 kg (203 lb 11.3 oz) IBW/kg (Calculated) : 79.9 Heparin weight: 89.6 kg  Vital Signs: Temp: 98.6 F (37 C) (08/09 1100) Temp Source: Oral (08/09 1100) BP: 101/79 (08/09 1200) Pulse Rate: 101 (08/09 1200)  Labs: Recent Labs    04/27/23 0020 04/28/23 0031 04/28/23 0032 04/28/23 0454 04/28/23 1102 04/28/23 1951 04/29/23 0638 04/29/23 1352  HGB 14.0 14.3  --  12.4*  --   --  13.5  --   HCT 41.4 42.0  --  37.6*  --   --  38.7*  --   PLT 237  --   --  187  --   --  270  --   LABPROT 19.6*  --   --   --   --   --   --   --   INR 1.6*  --   --   --   --   --   --   --   HEPARINUNFRC  --   --   --   --    < > 0.20* 0.24* 0.28*  CREATININE 5.87*  --  6.60* 5.76*  --   --  6.21*  --   CKTOTAL  --   --   --  4,057*  --   --  2,671*  --    < > = values in this interval not displayed.    Estimated Creatinine Clearance: 12.5 mL/min (A) (by C-G formula based on SCr of 6.21 mg/dL (H)).   Medical History: Past Medical History:  Diagnosis Date   Adrenal adenoma, left    Arthritis    Bursitis of elbow    Complication of anesthesia    ANESTHESIA DID NOT TAKE EFFECT WITH BACK SURGERY "I FELT THEM CUT"   Congenital absence of right kidney    Coronary artery disease    Diverticulosis of colon    Family history of anesthesia complication    "hard time waking up my dad" many yrs ago   Frequency of urination    Full dentures    GAD (generalized anxiety disorder)    GERD (gastroesophageal reflux disease)    History of acute renal failure    12-16-2013;  09-24-2017 due to stone obstruction   History of duodenal ulcer 04/2000   w/ gi bleed post egd w/ cauterization (caused by  chronic nsaid use)   History of encephalopathy 04/26/2014   ACUTE EPISODE SECONDARY TO DRUG OVERDOSE (NARC, BZD, FLEXERIL)   History of GI bleed    08/ 2001   duodenal ulcer bleed post egd w/ cauterization   History of kidney stones    History of prostatitis    Hyperlipidemia    Hypertension    Left bundle branch block 05/05/2021   Left ureteral stone    Nocturia more than twice per night    Peripheral neuropathy    bilateral leg numbness due to back issues, walks w/ cane   Pneumonia    PONV (postoperative nausea and vomiting)    Restless leg syndrome    Solitary left kidney    Wears glasses     Assessment: 72 y/o M with COVID-19, acute on chronic kidney failure,  and new onset afib. No anticoagulation prior to admission. Pharmacy consulted for heparin dosing.   HL 0.28 (subtherapeutic) on 2000 units/hr. No pauses or interruptions to infusion and no bleeding reported per d/w RN.   Goal of Therapy:  Heparin level 0.3-0.7 units/ml Monitor platelets by anticoagulation protocol: Yes   Plan:  Increase heparin drip to 2200 units/hr 8h heparin level  Daily CBC/Heparin level Monitor for bleeding  Thank you for allowing pharmacy to participate in this patient's care.  Loralee Pacas, PharmD, BCPS 04/29/2023,2:40 PM  Please check AMION for all [facility] Pharmacy phone numbers After 10:00 PM, call Main Pharmacy [phone]

## 2023-04-29 NOTE — Progress Notes (Addendum)
PROGRESS NOTE        PATIENT DETAILS Name: Bradley Morgan Age: 72 y.o. Sex: male Date of Birth: 15-May-1951 Admit Date: 04/27/2023 Admitting Physician Youlanda Roys, MD ZOX:[id number], Gloriajean Dell, MD  Brief Summary: Patient is a 72 y.o.  male with history of chronic HFrEF (EF 44% September 2022), HDL, HTN, solitary left kidney with CKD stage IIIa-who presented to the hospital with altered mental status-upon further evaluation-patient was found to have severe sepsis with acute hypoxic respiratory failure, acute metabolic encephalopathy, A-fib RVR and AKI-in the setting of pneumococcal pneumonia.  Significant events: 8/7>> admit to [facility] 8/8>> worsening hypoxemia-A-fib RVR-PCCM consulted-BiPAP overnight 8/9>> heated high flow-PCCM reconsulted  Significant studies: 8/7>> CXR: Dense consolidation RLL 8/7>> CT head: No acute intracranial abnormality 8 7>> echo: EF 25-30%, small pericardial effusion   Significant microbiology data: 8/8>> COVID PCR: Positive 8/8>> influenza PCR/RSV: Negative 8/8>> blood culture: No growth 8/8>> strep pneumoniae urinary antigen: Positive  Procedures: None  Consults: PCCM Cardiology Nephrology  Subjective: On BiPAP-which was placed overnight-somewhat comfortable-wanting it off-mouth is dry-discussed with RN-will see if we can transition him to heated high flow.  Objective: Vitals: Blood pressure 124/89, pulse (!) 120, temperature 97.9 F (36.6 C), temperature source Axillary, resp. rate 20, height 6\' 1"  (1.854 m), weight 93.1 kg, SpO2 92%.   Exam: Gen Exam: Comfortable on BiPAP HEENT:atraumatic, normocephalic Chest: B/L clear to auscultation anteriorly CVS:S1S2 regular Abdomen:soft non tender, non distended Extremities:no edema Neurology: Non focal Skin: no rash  Pertinent Labs/Radiology:    Latest Ref Rng & Units 04/29/2023    6:38 AM 04/28/2023    4:54 AM 04/28/2023   12:31 AM  CBC  WBC 4.0 - 10.5 K/uL 22.0  18.9     Hemoglobin 13.0 - 17.0 g/dL 40.9  81.1  91.4   Hematocrit 39.0 - 52.0 % 38.7  37.6  42.0   Platelets 150 - 400 K/uL 270  187      Lab Results  Component Value Date   NA 134 (L) 04/29/2023   K 4.2 04/29/2023   CL 99 04/29/2023   CO2 14 (L) 04/29/2023     Assessment/Plan: Severe sepsis secondary to pneumococcal pneumonia-right lobar pneumonia Acute hypoxemic respiratory failure Worsening overnight-required BiPAP-now on heated high flow Discussed with PCCM team-May need ultrasound of the right lower lobe to see if he has any effusion that can be tapped Continue empiric antibiotics PCCM to reevaluate-May need to be monitored closely in the ICU  Addendum Evaluated by PCCM-being transferred to the ICU.  AKI on chronic CKD stage IIIa Anion gap metabolic acidosis in the setting of sepsis/AKI Solitary left kidney AKI suspected to be hemodynamically mediated due to severe sepsis/ARB use-possibly ischemic ATN Creatinine continues to worsen Avoid nephrotoxic agents Await further recommendations from nephrology service.  Rhabdomyolysis Mild Improving Follow CK  Acute metabolic encephalopathy Etiology likely due to sepsis/AKI Seems much more awake and alert when compared to yesterday's notes  PAF with RVR Remains in A-fib-rate in the 120s Cardizem/heparin infusion Oral beta-blocker Given new low EF-will probably need to come off Cardizem infusion Cardiology consulted-await recommendations  Chronic systolic heart failure-worsening EF (25-30% by echo on 8/8) Volume status reasonable May have cardiomyopathy secondary to sepsis Cardiology consulted-await recommendations  RLS Requip  GERD PPI  BMI: Estimated body mass index is 27.08 kg/m as calculated from the following:   Height  as of this encounter: 6\' 1"  (1.854 m).   Weight as of this encounter: 93.1 kg.   Code status:   Code Status: Full Code   DVT Prophylaxis:IV heparin    Family Communication: None at  bedside   Disposition Plan: Status is: Inpatient Remains inpatient appropriate because: Severity of illness   Planned Discharge Destination:Skilled nursing facility  The patient is critically ill with multiple organ system failure and requires high complexity decision making for assessment and support, frequent evaluation and titration of therapies, advanced monitoring, review of radiographic studies and interpretation of complex data.   Total time spent: 45 minutes   Diet: Diet Order             DIET DYS 3 Room service appropriate? Yes; Fluid consistency: Thin  Diet effective now                     Antimicrobial agents: Anti-infectives (From admission, onward)    Start     Dose/Rate Route Frequency Ordered Stop   04/28/23 1830  azithromycin (ZITHROMAX) 500 mg in sodium chloride 0.9 % 250 mL IVPB  Status:  Discontinued        500 mg 250 mL/hr over 60 Minutes Intravenous Every 24 hours 04/28/23 0244 04/28/23 1616   04/28/23 1800  cefTRIAXone (ROCEPHIN) 2 g in sodium chloride 0.9 % 100 mL IVPB        2 g 200 mL/hr over 30 Minutes Intravenous Every 24 hours 04/28/23 0244 05/03/23 1759   04/28/23 0000  cefTRIAXone (ROCEPHIN) 2 g in sodium chloride 0.9 % 100 mL IVPB        2 g 200 mL/hr over 30 Minutes Intravenous  Once 04/27/23 2356 04/28/23 0052   04/28/23 0000  azithromycin (ZITHROMAX) 500 mg in sodium chloride 0.9 % 250 mL IVPB        500 mg 250 mL/hr over 60 Minutes Intravenous  Once 04/27/23 2356 04/28/23 0122        MEDICATIONS: Scheduled Meds:  aspirin EC  81 mg Oral Daily   carvedilol  6.25 mg Oral BID WC   dexamethasone  6 mg Oral Daily   gabapentin  100 mg Oral BID   levalbuterol  0.63 mg Nebulization Q6H   pantoprazole  40 mg Oral BID   rOPINIRole  1 mg Oral QHS   Continuous Infusions:  cefTRIAXone (ROCEPHIN)  IV 2 g (04/28/23 1803)   diltiazem (CARDIZEM) infusion 15 mg/hr (04/29/23 0647)   heparin 2,000 Units/hr (04/29/23 0759)   PRN  Meds:.acetaminophen, clonazePAM, HYDROcodone-acetaminophen, levalbuterol   I have personally reviewed following labs and imaging studies  LABORATORY DATA: CBC: Recent Labs  Lab 04/27/23 0020 04/28/23 0031 04/28/23 0454 04/29/23 0638  WBC 22.6*  --  18.9* 22.0*  NEUTROABS 20.2*  --  17.4*  --   HGB 14.0 14.3 12.4* 13.5  HCT 41.4 42.0 37.6* 38.7*  MCV 88.1  --  88.7 84.5  PLT 237  --  187 270    Basic Metabolic Panel: Recent Labs  Lab 04/27/23 0020 04/28/23 0031 04/28/23 0032 04/28/23 0454 04/28/23 2117 04/29/23 0638  NA 139 137  --  138  --  134*  K 3.7 3.6  --  4.1  --  4.2  CL 100  --   --  101  --  99  CO2 16*  --   --  18*  --  14*  GLUCOSE 106*  --   --  110*  --  112*  BUN 78*  --   --  80*  --  100*  CREATININE 5.87*  --  6.60* 5.76*  --  6.21*  CALCIUM 8.9  --   --  8.6*  --  8.1*  MG  --   --   --   --  2.1  --   PHOS  --   --   --   --  6.7*  --     GFR: Estimated Creatinine Clearance: 12.5 mL/min (A) (by C-G formula based on SCr of 6.21 mg/dL (H)).  Liver Function Tests: Recent Labs  Lab 04/27/23 0020 04/28/23 0454 04/29/23 0638  AST 60* 75* 61*  ALT 32 31 33  ALKPHOS 79 89 76  BILITOT 0.6 0.6 0.6  PROT 6.7 6.0* 5.8*  ALBUMIN 3.0* 2.6* 2.3*   No results for input(s): "LIPASE", "AMYLASE" in the last 168 hours. No results for input(s): "AMMONIA" in the last 168 hours.  Coagulation Profile: Recent Labs  Lab 04/27/23 0020  INR 1.6*    Cardiac Enzymes: Recent Labs  Lab 04/28/23 0454 04/29/23 0638  CKTOTAL 4,057* 2,671*    BNP (last 3 results) No results for input(s): "PROBNP" in the last 8760 hours.  Lipid Profile: No results for input(s): "CHOL", "HDL", "LDLCALC", "TRIG", "CHOLHDL", "LDLDIRECT" in the last 72 hours.  Thyroid Function Tests: No results for input(s): "TSH", "T4TOTAL", "FREET4", "T3FREE", "THYROIDAB" in the last 72 hours.  Anemia Panel: No results for input(s): "VITAMINB12", "FOLATE", "FERRITIN", "TIBC", "IRON",  "RETICCTPCT" in the last 72 hours.  Urine analysis:    Component Value Date/Time   COLORURINE AMBER (A) 04/28/2023 0616   APPEARANCEUR HAZY (A) 04/28/2023 0616   LABSPEC 1.016 04/28/2023 0616   PHURINE 5.0 04/28/2023 0616   GLUCOSEU NEGATIVE 04/28/2023 0616   HGBUR LARGE (A) 04/28/2023 0616   BILIRUBINUR NEGATIVE 04/28/2023 0616   KETONESUR NEGATIVE 04/28/2023 0616   PROTEINUR 100 (A) 04/28/2023 0616   UROBILINOGEN 0.2 05/02/2015 1120   NITRITE NEGATIVE 04/28/2023 0616   LEUKOCYTESUR NEGATIVE 04/28/2023 0616    Sepsis Labs: Lactic Acid, Venous    Component Value Date/Time   LATICACIDVEN 1.9 04/28/2023 0131    MICROBIOLOGY: Recent Results (from the past 240 hour(s))  Culture, blood (Routine x 2)     Status: None (Preliminary result)   Collection Time: 04/28/23 12:20 AM   Specimen: BLOOD RIGHT ARM  Result Value Ref Range Status   Specimen Description BLOOD RIGHT ARM  Final   Special Requests   Final    BOTTLES DRAWN AEROBIC AND ANAEROBIC Blood Culture adequate volume   Culture   Final    NO GROWTH 1 DAY Performed at [facility], [address] [place], [place] [address]    Report Status PENDING  Incomplete  Culture, blood (Routine x 2)     Status: None (Preliminary result)   Collection Time: 04/28/23 12:20 AM   Specimen: BLOOD LEFT ARM  Result Value Ref Range Status   Specimen Description BLOOD LEFT ARM  Final   Special Requests   Final    BOTTLES DRAWN AEROBIC AND ANAEROBIC Blood Culture adequate volume   Culture   Final    NO GROWTH 1 DAY Performed at [facility], [address] [place], [place] [address]    Report Status PENDING  Incomplete  Resp panel by RT-PCR (RSV, Flu A&B, Covid) Anterior Nasal Swab     Status: Abnormal   Collection Time: 04/28/23 12:20 AM   Specimen: Anterior Nasal Swab  Result Value Ref Range Status   SARS Coronavirus 2 by RT PCR POSITIVE (A) NEGATIVE Final   Influenza A by PCR NEGATIVE NEGATIVE Final   Influenza B by  PCR NEGATIVE NEGATIVE Final    Comment: (NOTE) The Xpert Xpress SARS-CoV-2/FLU/RSV plus assay is intended as an aid in the diagnosis of influenza from Nasopharyngeal swab specimens and should not be used as a sole basis for treatment. Nasal washings and aspirates are unacceptable for Xpert Xpress SARS-CoV-2/FLU/RSV testing.  Fact Sheet for Patients: BloggerCourse.com  Fact Sheet for Healthcare Providers: SeriousBroker.it  This test is not yet approved or cleared by the [place] FDA and has been authorized for detection and/or diagnosis of SARS-CoV-2 by FDA under an Emergency Use Authorization (EUA). This EUA will remain in effect (meaning this test can be used) for the duration of the COVID-19 declaration under Section 564(b)(1) of the Act, 21 U.S.C. section 360bbb-3(b)(1), unless the authorization is terminated or revoked.     Resp Syncytial Virus by PCR NEGATIVE NEGATIVE Final    Comment: (NOTE) Fact Sheet for Patients: BloggerCourse.com  Fact Sheet for Healthcare Providers: SeriousBroker.it  This test is not yet approved or cleared by the [place] FDA and has been authorized for detection and/or diagnosis of SARS-CoV-2 by FDA under an Emergency Use Authorization (EUA). This EUA will remain in effect (meaning this test can be used) for the duration of the COVID-19 declaration under Section 564(b)(1) of the Act, 21 U.S.C. section 360bbb-3(b)(1), unless the authorization is terminated or revoked.  Performed at [facility], [address], [place], [place] [address]     RADIOLOGY STUDIES/RESULTS: DG Chest Port 1V same Day  Result Date: 04/29/2023 CLINICAL DATA:  73 year old male with shortness of breath. EXAM: PORTABLE CHEST 1 VIEW COMPARISON:  Portable chest 04/28/2023 and earlier. FINDINGS: Portable AP semi upright view at 0652 hours. Ongoing low lung  volumes. Stable mediastinal contours with suspicion of cardiomegaly. Visualized tracheal air column is within normal limits. Asymmetry of the diaphragm which might reflect mild elevation on the right side, although small right pleural effusion is difficult to exclude. Confluent medial right lung base airspace disease seen on 04/27/2023 likely on going, with possible air bronchograms along the right heart border. Upper lungs remain clear. No pneumothorax. Stable vascularity without edema. No definite left lung consolidation. Paucity of bowel gas in the upper abdomen. Stable visualized osseous structures. Postoperative changes to the cervical spine and right shoulder. IMPRESSION: 1. Evidence of ongoing right lung base consolidation/pneumonia. Some underlying chronic elevation of the right hemidiaphragm. Small if any right pleural effusion. 2. Low lung volumes with no new cardiopulmonary abnormality identified. Electronically Signed   By: Odessa Fleming M.D.   On: 04/29/2023 07:10   ECHOCARDIOGRAM COMPLETE  Result Date: 04/28/2023    ECHOCARDIOGRAM REPORT   Patient Name:   Bradley Morgan Date of Exam: 04/28/2023 Medical Rec #:  [id number]       Height:       73.0 in Accession #:    [id number]      Weight:       197.5 lb Date of Birth:  1951-08-13       BSA:          2.140 m Patient Age:    72 years        BP:           107/81 mmHg Patient Gender: M  HR:           130 bpm. Exam Location:  Inpatient Procedure: 2D Echo, Cardiac Doppler and Color Doppler Indications:    I49.9* Cardiac arrhythmia, unspecified  History:        Patient has prior history of Echocardiogram examinations, most                 recent 06/09/2021. Abnormal ECG; Signs/Symptoms:Covid.  Sonographer:    Darlys Gales Referring Phys: Youlanda Roys  Sonographer Comments: Suboptimal parasternal window. IMPRESSIONS  1. Spontaneous echo contrast noted in LV, concerning for low flow state. Left ventricular ejection fraction, by estimation, is 25-30%  with septal/lateral dyssynchrony. The left ventricle has severely decreased function. The left ventricle demonstrates global hypokinesis. There is moderate left ventricular hypertrophy. Left ventricular diastolic parameters are indeterminate.  2. Right ventricular systolic function is mildly reduced. The right ventricular size is normal.  3. Left atrial size was moderately dilated.  4. A small pericardial effusion is present.  5. The mitral valve is grossly normal. Trivial mitral valve regurgitation. No evidence of mitral stenosis.  6. The aortic valve is grossly normal. There is mild thickening of the aortic valve. Aortic valve regurgitation is mild. No aortic stenosis is present.  7. IVC not well visualized. FINDINGS  Left Ventricle: Spontaneous echo contrast noted in LV, concerning for low flow state. Left ventricular ejection fraction, by estimation, is 25 to 30%. The left ventricle has severely decreased function. The left ventricle demonstrates global hypokinesis. The left ventricular internal cavity size was normal in size. There is moderate left ventricular hypertrophy. Abnormal (paradoxical) septal motion, consistent with left bundle branch block. Left ventricular diastolic parameters are indeterminate. Right Ventricle: The right ventricular size is normal. Right vetricular wall thickness was not well visualized. Right ventricular systolic function is mildly reduced. Left Atrium: Left atrial size was moderately dilated. Right Atrium: Right atrial size was normal in size. Pericardium: A small pericardial effusion is present. Mitral Valve: The mitral valve is grossly normal. Mild mitral annular calcification. Trivial mitral valve regurgitation. No evidence of mitral valve stenosis. Tricuspid Valve: The tricuspid valve is grossly normal. Tricuspid valve regurgitation is mild. Aortic Valve: The aortic valve is grossly normal. There is mild thickening of the aortic valve. Aortic valve regurgitation is mild. No  aortic stenosis is present. Pulmonic Valve: The pulmonic valve was not well visualized. Pulmonic valve regurgitation is trivial. Aorta: The aortic root was not well visualized. Venous: IVC not well visualized. The inferior vena cava was not well visualized. IAS/Shunts: The interatrial septum was not well visualized.  LEFT VENTRICLE PLAX 2D LVIDd:         3.50 cm LV PW:         1.20 cm LV IVS:        1.54 cm LVOT diam:     2.00 cm LVOT Area:     3.14 cm  LEFT ATRIUM             Index LA Vol (A2C):   65.3 ml 30.51 ml/m LA Vol (A4C):   56.5 ml 26.40 ml/m LA Biplane Vol: 62.2 ml 29.06 ml/m  TRICUSPID VALVE TR Peak grad:   25.8 mmHg TR Vmax:        254.00 cm/s  SHUNTS Systemic Diam: 2.00 cm Weston Brass MD Electronically signed by Weston Brass MD Signature Date/Time: 04/28/2023/5:41:31 PM    Final    DG Chest Port 1 View  Result Date: 04/28/2023 CLINICAL DATA:  Shortness of breath EXAM:  PORTABLE CHEST 1 VIEW COMPARISON:  Chest x-ray 04/27/2023 FINDINGS: The heart is enlarged, unchanged. Small right pleural effusion has decreased. Right basilar infiltrate has resolved. Left lung is clear. No pneumothorax. Osseous structures are stable. IMPRESSION: Small right pleural effusion has decreased. Right basilar infiltrate has resolved. Electronically Signed   By: Darliss Cheney M.D.   On: 04/28/2023 15:46   US RENAL  Result Date: 04/28/2023 CLINICAL DATA:  Acute kidney injury. EXAM: RENAL / URINARY TRACT ULTRASOUND COMPLETE COMPARISON:  None Available. FINDINGS: Right Kidney: The right kidney is not visualized. Left Kidney: Renal measurements: 11.2 cm x 5.7 cm x 7.4 cm = volume: 245.4 mL. Echogenicity within normal limits. A 1.6 cm x 1.2 cm x 1.3 cm simple cyst is seen within the left kidney. No abnormal flow is noted within this region on color Doppler evaluation. No hydronephrosis is visualized. Bladder: A mild amount of echogenic debris is seen within the dependent portion of the gallbladder lumen. Other: None.  IMPRESSION: 1. Nonvisualization of the right kidney. 2. 1.6 cm x 1.2 cm x 1.3 cm simple left renal cyst. 3. Mild amount of echogenic debris within the urinary bladder. A small amount of blood products cannot be excluded. Correlation with urinalysis is recommended. Electronically Signed   By: Aram Candela M.D.   On: 04/28/2023 03:16   CT Head Wo Contrast  Result Date: 04/27/2023 CLINICAL DATA:  Altered mental status. EXAM: CT HEAD WITHOUT CONTRAST TECHNIQUE: Contiguous axial images were obtained from the base of the skull through the vertex without intravenous contrast. RADIATION DOSE REDUCTION: This exam was performed according to the departmental dose-optimization program which includes automated exposure control, adjustment of the mA and/or kV according to patient size and/or use of iterative reconstruction technique. COMPARISON:  April 26, 2014 FINDINGS: Brain: There is mild cerebral atrophy with widening of the extra-axial spaces and ventricular dilatation. There are areas of decreased attenuation within the white matter tracts of the supratentorial brain, consistent with microvascular disease changes. Vascular: No hyperdense vessel or unexpected calcification. Skull: Normal. Negative for fracture or focal lesion. Sinuses/Orbits: There is mild left maxillary sinus mucosal thickening. Other: None. IMPRESSION: 1. Generalized cerebral atrophy with mild, chronic white matter small vessel ischemic changes. 2. No acute intracranial abnormality. 3. Mild left maxillary sinus disease. Electronically Signed   By: Aram Candela M.D.   On: 04/27/2023 23:58   DG Chest 2 View  Result Date: 04/27/2023 CLINICAL DATA:  Suspected sepsis. EXAM: CHEST - 2 VIEW COMPARISON:  Chest radiograph 10/04/2014. FINDINGS: Dense consolidation in the right lung base likely involving both right middle and lower lobes. Suspected air bronchograms. Mild peribronchial thickening. Streaky atelectasis at the left lung base. Grossly  stable heart size and mediastinal contours. No large pleural effusion. No pneumothorax. Cervical spine hardware is partially included. IMPRESSION: Dense consolidation in the right lung base likely involving both right middle and lower lobes, suspicious for pneumonia. Followup PA and lateral chest X-ray is recommended in 3-4 weeks following trial of antibiotic therapy to ensure resolution and exclude underlying malignancy. Electronically Signed   By: Narda Rutherford M.D.   On: 04/27/2023 23:45     LOS: 1 day   Jeoffrey Massed, MD  [facility]    To contact the attending provider between 7A-7P or the covering provider during after hours 7P-7A, please log into the web site [email or web address] and access using universal [facility] password for that web site. If you do not have the password, please call the hospital operator.  04/29/2023, 8:29 AM

## 2023-04-29 NOTE — Plan of Care (Signed)
  Problem: Education: Goal: Knowledge of risk factors and measures for prevention of condition will improve Outcome: Progressing   Problem: Respiratory: Goal: Will maintain a patent airway Outcome: Progressing   Problem: Education: Goal: Knowledge of General Education information will improve Description: Including pain rating scale, medication(s)/side effects and non-pharmacologic comfort measures Outcome: Progressing   Problem: Activity: Goal: Risk for activity intolerance will decrease Outcome: Progressing   Problem: Nutrition: Goal: Adequate nutrition will be maintained Outcome: Progressing   Problem: Safety: Goal: Ability to remain free from injury will improve Outcome: Progressing

## 2023-04-29 NOTE — Progress Notes (Signed)
Subjective:  says he feels terrible-  pan positive ROS-  diagnoses so far include strep PNA , covid, afib with RVR and a new dec EF-  cards and critical care have seen-  400 of UOP is recorded-  he denies any disruption of UOP PTA. BUN and crt worse  Objective Vital signs in last 24 hours: Vitals:   04/29/23 1100 04/29/23 1102 04/29/23 1107 04/29/23 1200  BP:    101/79  Pulse:    (!) 101  Resp:    (!) 25  Temp: 98.6 F (37 C)     TempSrc: Oral     SpO2:  92% 93% 93%  Weight: 92.4 kg     Height: 6\' 1"  (1.854 m)      Weight change: 3.5 kg  Intake/Output Summary (Last 24 hours) at 04/29/2023 1246 Last data filed at 04/29/2023 1200 Gross per 24 hour  Intake 660.97 ml  Output 400 ml  Net 260.97 ml    Assessment/ Plan: Pt is a 72 y.o. yo male with ischemic cardiomyopathy, A fib, solitary kidney with baseline stage 3 CKD  who was admitted on 04/27/2023 with confusion-  diagnosed with bacterial PNA and covid-  also found to have worsening EF and Afib with RVR  Assessment/Plan: 1. Renal-  crt 1.6 in May of 23, 1.22 in April of 24.  Now with  A on CRF in the setting of PNA, Afib with RVR-  imaging does not show hydro-  urine with 100 of protein- no RBC, min WBC.  Renal function trending worse since admit but non oliguric.  Suspect mostly hemodynamic given the scenario but also an element of rhabdo-  was on ARB as OP-  has been held.    Not quite to dialysis indications but close.  CCM is involved now and pt transferred to ICU.  I would say has a high liklihood of progressing to needing dialysis this admission and given soft BP will likely need CRRT-  will follow closely  2. Metabolic acidosis- elevated lactate got a liter of LR and has continued with IVF to support BP and acidosis-  also added oral bicarb 3. Anemia-  surprisingly good-  indicates likely dry   4. HTN/volume- if anything seems dry-  gentle IVF  Cecille Aver    Labs: Basic Metabolic Panel: Recent Labs  Lab  04/27/23 0020 04/28/23 0031 04/28/23 0032 04/28/23 0454 04/28/23 2117 04/29/23 0638  NA 139 137  --  138  --  134*  K 3.7 3.6  --  4.1  --  4.2  CL 100  --   --  101  --  99  CO2 16*  --   --  18*  --  14*  GLUCOSE 106*  --   --  110*  --  112*  BUN 78*  --   --  80*  --  100*  CREATININE 5.87*  --  6.60* 5.76*  --  6.21*  CALCIUM 8.9  --   --  8.6*  --  8.1*  PHOS  --   --   --   --  6.7*  --    Liver Function Tests: Recent Labs  Lab 04/27/23 0020 04/28/23 0454 04/29/23 0638  AST 60* 75* 61*  ALT 32 31 33  ALKPHOS 79 89 76  BILITOT 0.6 0.6 0.6  PROT 6.7 6.0* 5.8*  ALBUMIN 3.0* 2.6* 2.3*   No results for input(s): "LIPASE", "AMYLASE" in the last 168 hours. No results  for input(s): "AMMONIA" in the last 168 hours. CBC: Recent Labs  Lab 04/27/23 0020 04/28/23 0031 04/28/23 0454 04/29/23 0638  WBC 22.6*  --  18.9* 22.0*  NEUTROABS 20.2*  --  17.4*  --   HGB 14.0 14.3 12.4* 13.5  HCT 41.4 42.0 37.6* 38.7*  MCV 88.1  --  88.7 84.5  PLT 237  --  187 270   Cardiac Enzymes: Recent Labs  Lab 04/28/23 0454 04/29/23 0638  CKTOTAL 4,057* 2,671*   CBG: Recent Labs  Lab 04/29/23 1103  GLUCAP 121*    Iron Studies: No results for input(s): "IRON", "TIBC", "TRANSFERRIN", "FERRITIN" in the last 72 hours. Studies/Results: DG Chest Port 1V same Day  Result Date: 04/29/2023 CLINICAL DATA:  72 year old male with shortness of breath. EXAM: PORTABLE CHEST 1 VIEW COMPARISON:  Portable chest 04/28/2023 and earlier. FINDINGS: Portable AP semi upright view at 0652 hours. Ongoing low lung volumes. Stable mediastinal contours with suspicion of cardiomegaly. Visualized tracheal air column is within normal limits. Asymmetry of the diaphragm which might reflect mild elevation on the right side, although small right pleural effusion is difficult to exclude. Confluent medial right lung base airspace disease seen on 04/27/2023 likely on going, with possible air bronchograms along the right  heart border. Upper lungs remain clear. No pneumothorax. Stable vascularity without edema. No definite left lung consolidation. Paucity of bowel gas in the upper abdomen. Stable visualized osseous structures. Postoperative changes to the cervical spine and right shoulder. IMPRESSION: 1. Evidence of ongoing right lung base consolidation/pneumonia. Some underlying chronic elevation of the right hemidiaphragm. Small if any right pleural effusion. 2. Low lung volumes with no new cardiopulmonary abnormality identified. Electronically Signed   By: Odessa Fleming M.D.   On: 04/29/2023 07:10   ECHOCARDIOGRAM COMPLETE  Result Date: 04/28/2023    ECHOCARDIOGRAM REPORT   Patient Name:   Bradley Morgan Date of Exam: 04/28/2023 Medical Rec #:  [id number]       Height:       73.0 in Accession #:    [id number]      Weight:       197.5 lb Date of Birth:  Jan 02, 1951       BSA:          2.140 m Patient Age:    72 years        BP:           107/81 mmHg Patient Gender: M               HR:           130 bpm. Exam Location:  Inpatient Procedure: 2D Echo, Cardiac Doppler and Color Doppler Indications:    I49.9* Cardiac arrhythmia, unspecified  History:        Patient has prior history of Echocardiogram examinations, most                 recent 06/09/2021. Abnormal ECG; Signs/Symptoms:Covid.  Sonographer:    Darlys Gales Referring Phys: Youlanda Roys  Sonographer Comments: Suboptimal parasternal window. IMPRESSIONS  1. Spontaneous echo contrast noted in LV, concerning for low flow state. Left ventricular ejection fraction, by estimation, is 25-30% with septal/lateral dyssynchrony. The left ventricle has severely decreased function. The left ventricle demonstrates global hypokinesis. There is moderate left ventricular hypertrophy. Left ventricular diastolic parameters are indeterminate.  2. Right ventricular systolic function is mildly reduced. The right ventricular size is normal.  3. Left atrial size was moderately dilated.  4. A small  pericardial effusion is present.  5. The mitral valve is grossly normal. Trivial mitral valve regurgitation. No evidence of mitral stenosis.  6. The aortic valve is grossly normal. There is mild thickening of the aortic valve. Aortic valve regurgitation is mild. No aortic stenosis is present.  7. IVC not well visualized. FINDINGS  Left Ventricle: Spontaneous echo contrast noted in LV, concerning for low flow state. Left ventricular ejection fraction, by estimation, is 25 to 30%. The left ventricle has severely decreased function. The left ventricle demonstrates global hypokinesis. The left ventricular internal cavity size was normal in size. There is moderate left ventricular hypertrophy. Abnormal (paradoxical) septal motion, consistent with left bundle branch block. Left ventricular diastolic parameters are indeterminate. Right Ventricle: The right ventricular size is normal. Right vetricular wall thickness was not well visualized. Right ventricular systolic function is mildly reduced. Left Atrium: Left atrial size was moderately dilated. Right Atrium: Right atrial size was normal in size. Pericardium: A small pericardial effusion is present. Mitral Valve: The mitral valve is grossly normal. Mild mitral annular calcification. Trivial mitral valve regurgitation. No evidence of mitral valve stenosis. Tricuspid Valve: The tricuspid valve is grossly normal. Tricuspid valve regurgitation is mild. Aortic Valve: The aortic valve is grossly normal. There is mild thickening of the aortic valve. Aortic valve regurgitation is mild. No aortic stenosis is present. Pulmonic Valve: The pulmonic valve was not well visualized. Pulmonic valve regurgitation is trivial. Aorta: The aortic root was not well visualized. Venous: IVC not well visualized. The inferior vena cava was not well visualized. IAS/Shunts: The interatrial septum was not well visualized.  LEFT VENTRICLE PLAX 2D LVIDd:         3.50 cm LV PW:         1.20 cm LV IVS:         1.54 cm LVOT diam:     2.00 cm LVOT Area:     3.14 cm  LEFT ATRIUM             Index LA Vol (A2C):   65.3 ml 30.51 ml/m LA Vol (A4C):   56.5 ml 26.40 ml/m LA Biplane Vol: 62.2 ml 29.06 ml/m  TRICUSPID VALVE TR Peak grad:   25.8 mmHg TR Vmax:        254.00 cm/s  SHUNTS Systemic Diam: 2.00 cm Weston Brass MD Electronically signed by Weston Brass MD Signature Date/Time: 04/28/2023/5:41:31 PM    Final    DG Chest Port 1 View  Result Date: 04/28/2023 CLINICAL DATA:  Shortness of breath EXAM: PORTABLE CHEST 1 VIEW COMPARISON:  Chest x-ray 04/27/2023 FINDINGS: The heart is enlarged, unchanged. Small right pleural effusion has decreased. Right basilar infiltrate has resolved. Left lung is clear. No pneumothorax. Osseous structures are stable. IMPRESSION: Small right pleural effusion has decreased. Right basilar infiltrate has resolved. Electronically Signed   By: Darliss Cheney M.D.   On: 04/28/2023 15:46   US RENAL  Result Date: 04/28/2023 CLINICAL DATA:  Acute kidney injury. EXAM: RENAL / URINARY TRACT ULTRASOUND COMPLETE COMPARISON:  None Available. FINDINGS: Right Kidney: The right kidney is not visualized. Left Kidney: Renal measurements: 11.2 cm x 5.7 cm x 7.4 cm = volume: 245.4 mL. Echogenicity within normal limits. A 1.6 cm x 1.2 cm x 1.3 cm simple cyst is seen within the left kidney. No abnormal flow is noted within this region on color Doppler evaluation. No hydronephrosis is visualized. Bladder: A mild amount of echogenic debris is seen within the dependent portion of the gallbladder lumen.  Other: None. IMPRESSION: 1. Nonvisualization of the right kidney. 2. 1.6 cm x 1.2 cm x 1.3 cm simple left renal cyst. 3. Mild amount of echogenic debris within the urinary bladder. A small amount of blood products cannot be excluded. Correlation with urinalysis is recommended. Electronically Signed   By: Aram Candela M.D.   On: 04/28/2023 03:16   CT Head Wo Contrast  Result Date: 04/27/2023 CLINICAL  DATA:  Altered mental status. EXAM: CT HEAD WITHOUT CONTRAST TECHNIQUE: Contiguous axial images were obtained from the base of the skull through the vertex without intravenous contrast. RADIATION DOSE REDUCTION: This exam was performed according to the departmental dose-optimization program which includes automated exposure control, adjustment of the mA and/or kV according to patient size and/or use of iterative reconstruction technique. COMPARISON:  April 26, 2014 FINDINGS: Brain: There is mild cerebral atrophy with widening of the extra-axial spaces and ventricular dilatation. There are areas of decreased attenuation within the white matter tracts of the supratentorial brain, consistent with microvascular disease changes. Vascular: No hyperdense vessel or unexpected calcification. Skull: Normal. Negative for fracture or focal lesion. Sinuses/Orbits: There is mild left maxillary sinus mucosal thickening. Other: None. IMPRESSION: 1. Generalized cerebral atrophy with mild, chronic white matter small vessel ischemic changes. 2. No acute intracranial abnormality. 3. Mild left maxillary sinus disease. Electronically Signed   By: Aram Candela M.D.   On: 04/27/2023 23:58   DG Chest 2 View  Result Date: 04/27/2023 CLINICAL DATA:  Suspected sepsis. EXAM: CHEST - 2 VIEW COMPARISON:  Chest radiograph 10/04/2014. FINDINGS: Dense consolidation in the right lung base likely involving both right middle and lower lobes. Suspected air bronchograms. Mild peribronchial thickening. Streaky atelectasis at the left lung base. Grossly stable heart size and mediastinal contours. No large pleural effusion. No pneumothorax. Cervical spine hardware is partially included. IMPRESSION: Dense consolidation in the right lung base likely involving both right middle and lower lobes, suspicious for pneumonia. Followup PA and lateral chest X-ray is recommended in 3-4 weeks following trial of antibiotic therapy to ensure resolution and exclude  underlying malignancy. Electronically Signed   By: Narda Rutherford M.D.   On: 04/27/2023 23:45   Medications: Infusions:  amiodarone 60 mg/hr (04/29/23 1200)   amiodarone     cefTRIAXone (ROCEPHIN)  IV 2 g (04/28/23 1803)   heparin 2,000 Units/hr (04/29/23 1200)    Scheduled Medications:  aspirin EC  81 mg Oral Daily   carvedilol  6.25 mg Oral BID WC   [START ON 04/30/2023] Chlorhexidine Gluconate Cloth  6 each Topical Daily   dexamethasone  6 mg Oral Daily   gabapentin  100 mg Oral BID   levalbuterol  0.63 mg Nebulization Q6H   pantoprazole  40 mg Oral BID   rOPINIRole  1 mg Oral QHS   sodium bicarbonate  650 mg Oral BID    have reviewed scheduled and prn medications.  Physical Exam: General: pale, large O2 req-  some distress Heart: irreg Lungs: CBS bilat  Abdomen: soft, non tender Extremities: no edema    04/29/2023,12:46 PM  LOS: 1 day

## 2023-04-29 NOTE — Progress Notes (Signed)
eLink Physician-Brief Progress Note Patient Name: Bradley Morgan DOB: 02/10/1951 MRN: [id number]   Date of Service  04/29/2023  HPI/Events of Note  Throwing PVCs, had a non sustained 3 beat run of Vtach. On amio drip. Received request to check electrolytes. Patient asymptomatic  eICU Interventions  Reviewed previous labs and abnormalities noted BMP, Mg ordered     Intervention Category Intermediate Interventions: Arrhythmia - evaluation and management  Darl Pikes 04/29/2023, 11:05 PM

## 2023-04-29 NOTE — Progress Notes (Signed)
ANTICOAGULATION CONSULT NOTE  Pharmacy Consult for Heparin  Indication: atrial fibrillation Brief A/P: Heparin level subtherapeutic Increase Heparin rate   Allergies  Allergen Reactions   Aspirin Other (See Comments)    Stomach ulcers. No high doses   Nsaids Other (See Comments)    CAUSES STOMACH ULCERS     Patient Measurements: Height: 6\' 1"  (185.4 cm) Weight: 93.1 kg (205 lb 4 oz) IBW/kg (Calculated) : 79.9 Heparin weight: 89.6 kg  Vital Signs: Temp: 97.9 F (36.6 C) (08/09 0429) Temp Source: Axillary (08/09 0429) BP: 124/89 (08/09 0429) Pulse Rate: 120 (08/09 0429)  Labs: Recent Labs    04/27/23 0020 04/28/23 0031 04/28/23 0032 04/28/23 0454 04/28/23 1102 04/28/23 1951 04/29/23 0638  HGB 14.0 14.3  --  12.4*  --   --  13.5  HCT 41.4 42.0  --  37.6*  --   --  38.7*  PLT 237  --   --  187  --   --  270  LABPROT 19.6*  --   --   --   --   --   --   INR 1.6*  --   --   --   --   --   --   HEPARINUNFRC  --   --   --   --  <0.10* 0.20* 0.24*  CREATININE 5.87*  --  6.60* 5.76*  --   --   --   CKTOTAL  --   --   --  4,057*  --   --   --     Estimated Creatinine Clearance: 13.5 mL/min (A) (by C-G formula based on SCr of 5.76 mg/dL (H)).   Assessment: 72 y.o. male with Afib for heparin  Goal of Therapy:  Heparin level 0.3-0.7 units/ml Monitor platelets by anticoagulation protocol: Yes   Plan:  Increase Heparin 2000 units/hr Check heparin level in 8 hours.  Geannie Risen, PharmD, BCPS

## 2023-04-29 NOTE — Progress Notes (Addendum)
NAME:  Bradley Morgan, MRN:  [id number], DOB:  1951/08/28, LOS: 1 ADMISSION DATE:  04/27/2023, CONSULTATION DATE:  04/28/23 REFERRING MD:  Olevia Bowens, TRH, CHIEF COMPLAINT:  Respiratory distress   History of Present Illness:  Bradley Morgan is a 72 yo male with a PMH of CHF (EF 45%), HTN, HLD, CKD3a, and chronic LBBB who presented for progressive AMS, lethargy, and SOB over the last 7 days. Presented to ED and found to be afebrile, tachycardic to 140 with evidence of new-onset afib with RVR, tachypnea to 25, and hypoxemia to 90-93% O2 requiring 3L. WBC 22.6, Cr 6.6, lactic acid 3.2. Patient is COVID+. VBG with normal pH, low CO2, low bicarb. CXR with large consolidation in RLL. CT head normal. He was admitted to progressive on IV azithro and CTX for PNA. Outside window for remdesevir. Rates more reassuring at that time so no rate control given. Once on the floor, rapid was called for worsening respiratory distress now requiring 6L. He remained in afib with RVR. PCCM was ultimately consulted for further recommendations.  Pertinent  Medical History   Past Medical History:  Diagnosis Date   Adrenal adenoma, left    Arthritis    Bursitis of elbow    Complication of anesthesia    ANESTHESIA DID NOT TAKE EFFECT WITH BACK SURGERY "I FELT THEM CUT"   Congenital absence of right kidney    Coronary artery disease    Diverticulosis of colon    Family history of anesthesia complication    "hard time waking up my dad" many yrs ago   Frequency of urination    Full dentures    GAD (generalized anxiety disorder)    GERD (gastroesophageal reflux disease)    History of acute renal failure    12-16-2013;  09-24-2017 due to stone obstruction   History of duodenal ulcer 04/2000   w/ gi bleed post egd w/ cauterization (caused by chronic nsaid use)   History of encephalopathy 04/26/2014   ACUTE EPISODE SECONDARY TO DRUG OVERDOSE (NARC, BZD, FLEXERIL)   History of GI bleed    08/ 2001   duodenal ulcer bleed post  egd w/ cauterization   History of kidney stones    History of prostatitis    Hyperlipidemia    Hypertension    Left bundle branch block 05/05/2021   Left ureteral stone    Nocturia more than twice per night    Peripheral neuropathy    bilateral leg numbness due to back issues, walks w/ cane   Pneumonia    PONV (postoperative nausea and vomiting)    Restless leg syndrome    Solitary left kidney    Wears glasses    Significant Hospital Events: Including procedures, antibiotic start and stop dates in addition to other pertinent events   8/8 - admitted with PNA on IV abx, resp distress worsened requiring 3>6L O2 necessitating PCCM consult  04/29/2023 transferred to intensive care unit  Interim History / Subjective:  Patient feels his breathing has gotten worse over time. He has never been hospitalized for respiratory issues before. Has had PNA in the past without much complication. Does have about 30 pack-year smoking history but has been quit for 26 years. Has never been told he has afib and is not on any medications for this. Wife at bedside who feels he looks better now than when he presented in the ED.  Objective   Blood pressure 124/89, pulse (!) 120, temperature 98.7 F (37.1 C), temperature source Oral, resp.  rate 20, height 6\' 1"  (1.854 m), weight 93.1 kg, SpO2 94%.    FiO2 (%):  [70 %] 70 % PEEP:  [6 cmH20] 6 cmH20 Pressure Support:  [6 cmH20-8 cmH20] 8 cmH20   Intake/Output Summary (Last 24 hours) at 04/29/2023 0946 Last data filed at 04/29/2023 1610 Gross per 24 hour  Intake 169.83 ml  Output 400 ml  Net -230.17 ml   Filed Weights   04/28/23 0124 04/29/23 0429  Weight: 89.6 kg 93.1 kg    Examination: Elderly male who is somewhat confused No JVD lymphadenopathy Increased work of breathing and O2 saturation decreased despite being on 75% oxygen Heart sounds are irregular No lower extremity edema    Assessment & Plan:  Sepsis due to streptococcal pneumonia Acute  hypoxemic respiratory failure Acute metabolic encephalopathy COVID-19 Worsening respiratory distress on 04/29/2023 Requiring high flow oxygen of 75% Critical care unit transfer May need intubation  PAF with RVR Chronic LBBB Continue Calcium channel blocker and heparin  AKI on CKD3a Lab Results  Component Value Date   CREATININE 6.21 (H) 04/29/2023   CREATININE 5.76 (H) 04/28/2023   CREATININE 6.60 (H) 04/28/2023   Continue to monitor creatinine Avoid nephrotoxic      Remainder per primary team.  Labs   CBC: Recent Labs  Lab 04/27/23 0020 04/28/23 0031 04/28/23 0454 04/29/23 0638  WBC 22.6*  --  18.9* 22.0*  NEUTROABS 20.2*  --  17.4*  --   HGB 14.0 14.3 12.4* 13.5  HCT 41.4 42.0 37.6* 38.7*  MCV 88.1  --  88.7 84.5  PLT 237  --  187 270    Basic Metabolic Panel: Recent Labs  Lab 04/27/23 0020 04/28/23 0031 04/28/23 0032 04/28/23 0454 04/28/23 2117 04/29/23 0638  NA 139 137  --  138  --  134*  K 3.7 3.6  --  4.1  --  4.2  CL 100  --   --  101  --  99  CO2 16*  --   --  18*  --  14*  GLUCOSE 106*  --   --  110*  --  112*  BUN 78*  --   --  80*  --  100*  CREATININE 5.87*  --  6.60* 5.76*  --  6.21*  CALCIUM 8.9  --   --  8.6*  --  8.1*  MG  --   --   --   --  2.1  --   PHOS  --   --   --   --  6.7*  --     Recent Labs  Lab 04/27/23 0020 04/28/23 0032 04/28/23 0131 04/28/23 0454 04/29/23 0638  PROCALCITON  --   --   --   --  44.41  WBC 22.6*  --   --  18.9* 22.0*  LATICACIDVEN  --  3.2* 1.9  --   --     Liver Function Tests: Recent Labs  Lab 04/27/23 0020 04/28/23 0454 04/29/23 0638  AST 60* 75* 61*  ALT 32 31 33  ALKPHOS 79 89 76  BILITOT 0.6 0.6 0.6  PROT 6.7 6.0* 5.8*  ALBUMIN 3.0* 2.6* 2.3*    ABG    Component Value Date/Time   PHART 7.35 04/29/2023 0250   PCO2ART 27 (L) 04/29/2023 0250   PO2ART 101 04/29/2023 0250   HCO3 14.9 (L) 04/29/2023 0250   TCO2 19 (L) 04/28/2023 0031   ACIDBASEDEF 9.1 (H) 04/29/2023 0250    O2SAT 98.9 04/29/2023 0250  Coagulation Profile: Recent Labs  Lab 04/27/23 0020  INR 1.6*    Cardiac Enzymes: Recent Labs  Lab 04/28/23 0454 04/29/23 0638  CKTOTAL 4,057* 2,671*    HbA1C: Hgb A1c MFr Bld  Date/Time Value Ref Range Status  12/16/2013 08:34 PM 5.4 <5.7 % Final    Comment:    (NOTE)                                                                       According to the ADA Clinical Practice Recommendations for 2011, when HbA1c is used as a screening test:  >=6.5%   Diagnostic of Diabetes Mellitus           (if abnormal result is confirmed) 5.7-6.4%   Increased risk of developing Diabetes Mellitus References:Diagnosis and Classification of Diabetes Mellitus,Diabetes Care,2011,34(Suppl 1):S62-S69 and Standards of Medical Care in         Diabetes - 2011,Diabetes Care,2011,34 (Suppl 1):S11-S61.     Critical care time: 30 min    Brett Canales Minor ACNP Acute Care Nurse Practitioner [facility]Critical [facility] Please consult Amion 04/29/2023, 9:46 AM   I agree with the Advanced Practitioner's note, impression, and recommendations as outlined. I have taken an independent interval history, reviewed the chart and examined the patient.  My medical decision making is as follows:   Subjective: Worsening respiratory distress, renail failure, hypoxemia progressing to needing heated high flow  Objective: Blood pressure 101/79, pulse (!) 101, temperature 98.6 F (37 C), temperature source Oral, resp. rate (!) 25, height 6\' 1"  (1.854 m), weight 92.4 kg, SpO2 93%.  sitting up in bed, tachypnic On heated high flow Appears less alert than yesterday, eyes closed, fatigued Breath sounds diminished right lung base, no wheeze RR in the high 30s.  No peripheral edema Dry mucus membranes  Labs/Imaging: Cr>6 BUN 100,  Na 134 K 4.2 Phos 6.7 ABG with respiratory acidosis    Assessment and Plan:  Acute hypoxemic respiratory failure Covid 19 infection Severe  sepsis secondary to pneumococcal pneumonia, RLL AKI, oliguric with azotemia New onset/Acute HFrEF EF 25% Atrial fibrillation with RVR Anion gap metabolic acidosis secondary to renal failure  He is high risk for intubation. Continue heated high flow.  Switched from diltiazem to amiodarone for rate/rhythm control Heparin gtt Ceftriaxone for pneumoccal sepsis. MRSA swab repeated and pending. No risk factors for hospital acquired organisms.  Discussed plan of care with family May need CRRT. Agree that he is dry and will benefit from IVF.   The patient is critically ill due to respiratory failure with multiple organ systems failure and requires high complexity decision making for assessment and support, frequent evaluation and titration of therapies, application of advanced monitoring technologies and extensive interpretation of multiple databases.   Critical Care Time devoted to patient care services described in this note is 50 minutes. This time reflects time of care of this signee Charlott Holler . This critical care time does not reflect separately billable procedures or procedure time, teaching time and supervisory time of PA/NP/Med student/Med Resident etc but could involve care discussion time.  Charlott Holler [facility] 04/29/2023 1:50 PM  Pager: see AMION  If no response to pager, please call critical care on  call (see AMION) until 7pm After 7:00 pm call Elink

## 2023-04-30 ENCOUNTER — Inpatient Hospital Stay (HOSPITAL_COMMUNITY): Payer: Medicare HMO

## 2023-04-30 ENCOUNTER — Other Ambulatory Visit: Payer: Self-pay

## 2023-04-30 DIAGNOSIS — I5022 Chronic systolic (congestive) heart failure: Secondary | ICD-10-CM | POA: Diagnosis not present

## 2023-04-30 DIAGNOSIS — J189 Pneumonia, unspecified organism: Secondary | ICD-10-CM | POA: Diagnosis not present

## 2023-04-30 DIAGNOSIS — J9600 Acute respiratory failure, unspecified whether with hypoxia or hypercapnia: Secondary | ICD-10-CM | POA: Diagnosis not present

## 2023-04-30 DIAGNOSIS — I4891 Unspecified atrial fibrillation: Secondary | ICD-10-CM | POA: Diagnosis not present

## 2023-04-30 LAB — COMPREHENSIVE METABOLIC PANEL
ALT: 32 U/L (ref 0–44)
AST: 51 U/L — ABNORMAL HIGH (ref 15–41)
Albumin: 2.4 g/dL — ABNORMAL LOW (ref 3.5–5.0)
Alkaline Phosphatase: 78 U/L (ref 38–126)
Anion gap: 16 — ABNORMAL HIGH (ref 5–15)
BUN: 105 mg/dL — ABNORMAL HIGH (ref 8–23)
CO2: 18 mmol/L — ABNORMAL LOW (ref 22–32)
Calcium: 7.8 mg/dL — ABNORMAL LOW (ref 8.9–10.3)
Chloride: 97 mmol/L — ABNORMAL LOW (ref 98–111)
Creatinine, Ser: 5.7 mg/dL — ABNORMAL HIGH (ref 0.61–1.24)
GFR, Estimated: 10 mL/min — ABNORMAL LOW (ref >60)
Glucose, Bld: 182 mg/dL — ABNORMAL HIGH (ref 70–99)
Potassium: 3.8 mmol/L (ref 3.5–5.1)
Sodium: 131 mmol/L — ABNORMAL LOW (ref 135–145)
Total Bilirubin: 0.6 mg/dL (ref 0.3–1.2)
Total Protein: 5.6 g/dL — ABNORMAL LOW (ref 6.5–8.1)

## 2023-04-30 LAB — POCT I-STAT 7, (LYTES, BLD GAS, ICA,H+H)
Acid-base deficit: 7 mmol/L — ABNORMAL HIGH (ref 0.0–2.0)
Bicarbonate: 19 mmol/L — ABNORMAL LOW (ref 20.0–28.0)
Calcium, Ion: 1.09 mmol/L — ABNORMAL LOW (ref 1.15–1.40)
HCT: 40 % (ref 39.0–52.0)
Hemoglobin: 13.6 g/dL (ref 13.0–17.0)
O2 Saturation: 100 %
Patient temperature: 36.5
Potassium: 4.1 mmol/L (ref 3.5–5.1)
Sodium: 137 mmol/L (ref 135–145)
TCO2: 20 mmol/L — ABNORMAL LOW (ref 22–32)
pCO2 arterial: 39.5 mmHg (ref 32–48)
pH, Arterial: 7.288 — ABNORMAL LOW (ref 7.35–7.45)
pO2, Arterial: 214 mmHg — ABNORMAL HIGH (ref 83–108)

## 2023-04-30 LAB — MAGNESIUM: Magnesium: 2.1 mg/dL (ref 1.7–2.4)

## 2023-04-30 LAB — CBC
HCT: 36.6 % — ABNORMAL LOW (ref 39.0–52.0)
Hemoglobin: 12.5 g/dL — ABNORMAL LOW (ref 13.0–17.0)
MCH: 29.3 pg (ref 26.0–34.0)
MCHC: 34.2 g/dL (ref 30.0–36.0)
MCV: 85.7 fL (ref 80.0–100.0)
Platelets: 259 K/uL (ref 150–400)
RBC: 4.27 MIL/uL (ref 4.22–5.81)
RDW: 15.1 % (ref 11.5–15.5)
WBC: 17.2 K/uL — ABNORMAL HIGH (ref 4.0–10.5)
nRBC: 0.2 % (ref 0.0–0.2)

## 2023-04-30 LAB — HEPARIN LEVEL (UNFRACTIONATED): Heparin Unfractionated: 0.5 IU/mL (ref 0.30–0.70)

## 2023-04-30 LAB — GLUCOSE, CAPILLARY
Glucose-Capillary: 136 mg/dL — ABNORMAL HIGH (ref 70–99)
Glucose-Capillary: 116 mg/dL — ABNORMAL HIGH (ref 70–99)

## 2023-04-30 MED ORDER — HYDROCODONE-ACETAMINOPHEN 5-325 MG PO TABS
1.0000 | ORAL_TABLET | Freq: Four times a day (QID) | ORAL | Status: DC | PRN
  Administered 2023-05-01: 1
  Filled 2023-04-30: qty 1

## 2023-04-30 MED ORDER — DEXMEDETOMIDINE HCL IN NACL 400 MCG/100ML IV SOLN
0.0000 ug/kg/h | INTRAVENOUS | Status: DC
  Administered 2023-04-30: 0.4 ug/kg/h via INTRAVENOUS
  Administered 2023-05-01: 1 ug/kg/h via INTRAVENOUS
  Administered 2023-05-01 (×2): 1.2 ug/kg/h via INTRAVENOUS
  Administered 2023-05-01 (×2): 1 ug/kg/h via INTRAVENOUS
  Administered 2023-05-01 – 2023-05-03 (×8): 1.2 ug/kg/h via INTRAVENOUS
  Filled 2023-04-30 (×16): qty 100

## 2023-04-30 MED ORDER — FENTANYL CITRATE PF 50 MCG/ML IJ SOSY
50.0000 ug | PREFILLED_SYRINGE | INTRAMUSCULAR | Status: DC | PRN
  Administered 2023-04-30 – 2023-05-02 (×16): 100 ug via INTRAVENOUS
  Administered 2023-05-02: 200 ug via INTRAVENOUS
  Administered 2023-05-02 – 2023-05-03 (×5): 100 ug via INTRAVENOUS
  Administered 2023-05-03: 50 ug via INTRAVENOUS
  Administered 2023-05-03 (×2): 100 ug via INTRAVENOUS
  Administered 2023-05-03: 50 ug via INTRAVENOUS
  Administered 2023-05-04: 100 ug via INTRAVENOUS
  Filled 2023-04-30 (×14): qty 2
  Filled 2023-04-30: qty 4
  Filled 2023-04-30: qty 2

## 2023-04-30 MED ORDER — MIDAZOLAM HCL 2 MG/2ML IJ SOLN
1.0000 mg | INTRAMUSCULAR | Status: DC | PRN
  Administered 2023-04-30 – 2023-05-04 (×17): 2 mg via INTRAVENOUS
  Filled 2023-04-30 (×18): qty 2

## 2023-04-30 MED ORDER — POTASSIUM CHLORIDE 20 MEQ PO PACK
20.0000 meq | PACK | Freq: Once | ORAL | Status: AC
  Administered 2023-04-30: 20 meq
  Filled 2023-04-30: qty 1

## 2023-04-30 MED ORDER — AMIODARONE HCL 200 MG PO TABS
400.0000 mg | ORAL_TABLET | Freq: Two times a day (BID) | ORAL | Status: DC
  Administered 2023-04-30 – 2023-05-01 (×2): 400 mg
  Filled 2023-04-30 (×2): qty 2

## 2023-04-30 MED ORDER — SODIUM CHLORIDE 0.9% FLUSH
10.0000 mL | Freq: Two times a day (BID) | INTRAVENOUS | Status: DC
  Administered 2023-04-30: 10 mL
  Administered 2023-05-01: 20 mL
  Administered 2023-05-01 – 2023-05-03 (×4): 10 mL
  Administered 2023-05-03: 40 mL
  Administered 2023-05-04 – 2023-05-06 (×5): 10 mL

## 2023-04-30 MED ORDER — ETOMIDATE 2 MG/ML IV SOLN
20.0000 mg | Freq: Once | INTRAVENOUS | Status: AC
  Administered 2023-04-30: 20 mg via INTRAVENOUS
  Filled 2023-04-30: qty 10

## 2023-04-30 MED ORDER — CLONAZEPAM 0.5 MG PO TABS: 0.5000 mg | ORAL_TABLET | Freq: Three times a day (TID) | ORAL | Status: DC | PRN

## 2023-04-30 MED ORDER — MIDAZOLAM HCL 2 MG/2ML IJ SOLN
2.0000 mg | Freq: Once | INTRAMUSCULAR | Status: AC
  Administered 2023-04-30: 2 mg via INTRAVENOUS
  Filled 2023-04-30: qty 2

## 2023-04-30 MED ORDER — FENTANYL CITRATE PF 50 MCG/ML IJ SOSY
100.0000 ug | PREFILLED_SYRINGE | Freq: Once | INTRAMUSCULAR | Status: AC
  Administered 2023-04-30: 100 ug via INTRAVENOUS
  Filled 2023-04-30: qty 2

## 2023-04-30 MED ORDER — FENTANYL CITRATE PF 50 MCG/ML IJ SOSY
50.0000 ug | PREFILLED_SYRINGE | INTRAMUSCULAR | Status: AC | PRN
  Administered 2023-04-30 – 2023-05-02 (×3): 50 ug via INTRAVENOUS
  Filled 2023-04-30 (×2): qty 1

## 2023-04-30 MED ORDER — ROCURONIUM BROMIDE 50 MG/5ML IV SOLN
100.0000 mg | Freq: Once | INTRAVENOUS | Status: DC
  Filled 2023-04-30: qty 10

## 2023-04-30 MED ORDER — ACETAMINOPHEN 325 MG PO TABS
650.0000 mg | ORAL_TABLET | Freq: Three times a day (TID) | ORAL | Status: DC | PRN
  Administered 2023-05-02 – 2023-05-06 (×2): 650 mg
  Filled 2023-04-30 (×2): qty 2

## 2023-04-30 MED ORDER — FENTANYL CITRATE PF 50 MCG/ML IJ SOSY
PREFILLED_SYRINGE | INTRAMUSCULAR | Status: AC
  Filled 2023-04-30: qty 1

## 2023-04-30 MED ORDER — BUDESONIDE 0.5 MG/2ML IN SUSP
0.5000 mg | Freq: Two times a day (BID) | RESPIRATORY_TRACT | Status: DC
  Administered 2023-04-30 – 2023-05-06 (×13): 0.5 mg via RESPIRATORY_TRACT
  Filled 2023-04-30 (×13): qty 2

## 2023-04-30 MED ORDER — ORAL CARE MOUTH RINSE: 15.0000 mL | OROMUCOSAL | Status: DC | PRN

## 2023-04-30 MED ORDER — SODIUM BICARBONATE 650 MG PO TABS
650.0000 mg | ORAL_TABLET | Freq: Two times a day (BID) | ORAL | Status: DC
  Administered 2023-04-30 – 2023-05-05 (×10): 650 mg
  Filled 2023-04-30 (×10): qty 1

## 2023-04-30 MED ORDER — GABAPENTIN 250 MG/5ML PO SOLN
100.0000 mg | Freq: Two times a day (BID) | ORAL | Status: DC
  Administered 2023-04-30 – 2023-05-06 (×11): 100 mg
  Filled 2023-04-30 (×14): qty 2

## 2023-04-30 MED ORDER — ROPINIROLE HCL 1 MG PO TABS
1.0000 mg | ORAL_TABLET | Freq: Every day | ORAL | Status: DC
  Administered 2023-04-30 – 2023-05-05 (×5): 1 mg
  Filled 2023-04-30 (×7): qty 1

## 2023-04-30 MED ORDER — PHENYLEPHRINE 80 MCG/ML (10ML) SYRINGE FOR IV PUSH (FOR BLOOD PRESSURE SUPPORT)
PREFILLED_SYRINGE | INTRAVENOUS | Status: AC
  Filled 2023-04-30: qty 10

## 2023-04-30 MED ORDER — LACTATED RINGERS IV BOLUS
500.0000 mL | Freq: Once | INTRAVENOUS | Status: DC
Start: 2023-04-30 — End: 2023-04-30

## 2023-04-30 MED ORDER — SODIUM CHLORIDE 0.9% FLUSH: 10.0000 mL | INTRAVENOUS | Status: DC | PRN

## 2023-04-30 MED ORDER — ORAL CARE MOUTH RINSE
15.0000 mL | OROMUCOSAL | Status: DC
  Administered 2023-04-30 – 2023-05-06 (×66): 15 mL via OROMUCOSAL

## 2023-04-30 MED ORDER — DEXAMETHASONE 4 MG PO TABS
6.0000 mg | ORAL_TABLET | Freq: Every day | ORAL | Status: DC
  Administered 2023-05-01 – 2023-05-06 (×6): 6 mg
  Filled 2023-04-30 (×6): qty 2

## 2023-04-30 MED ORDER — POTASSIUM CHLORIDE CRYS ER 20 MEQ PO TBCR: 20.0000 meq | EXTENDED_RELEASE_TABLET | Freq: Once | ORAL | Status: DC

## 2023-04-30 MED ORDER — ROCURONIUM BROMIDE 10 MG/ML (PF) SYRINGE
PREFILLED_SYRINGE | INTRAVENOUS | Status: AC
  Filled 2023-04-30: qty 10

## 2023-04-30 MED ORDER — LORAZEPAM 2 MG/ML IJ SOLN
0.5000 mg | Freq: Once | INTRAMUSCULAR | Status: AC | PRN
  Administered 2023-04-30: 0.5 mg via INTRAVENOUS
  Filled 2023-04-30: qty 1

## 2023-04-30 MED ORDER — AMIODARONE HCL 200 MG PO TABS
400.0000 mg | ORAL_TABLET | Freq: Two times a day (BID) | ORAL | Status: DC
  Administered 2023-04-30: 400 mg via ORAL
  Filled 2023-04-30: qty 2

## 2023-04-30 NOTE — Progress Notes (Addendum)
PICC ordered.  Pt with GFR 10.  Dr Arlean Hopping nephrology orders to not place PICC line via secure chat. Dr Everardo All added to conversation as well as Rolly Pancake.  Spoke with pt family, pt lethargic, cool and clammy to touch.  Explained reason the PICC will not be placed to family, verbalized understanding.  2 PICC nurses Assessed BUE for PIV, no suitable areas present due to multiple infiltrates, bruising from previous attempts and poor veins. Dr Everardo All notified via secure chat that CVC recommended.

## 2023-04-30 NOTE — Progress Notes (Signed)
Progress Note  Patient Name: Bradley Morgan Date of Encounter: 04/30/2023 Primary Cardiologist: Donato Schultz, MD   Subjective   Overnight Creatinine improving. Patient notes fatigue but follows along with converation.  Vital Signs    Vitals:   04/30/23 0500 04/30/23 0530 04/30/23 0600 04/30/23 0630  BP: 126/73  (!) 108/95   Pulse: (!) 119 (!) 102 (!) 117 (!) 148  Resp: (!) 26 (!) 22 (!) 27 (!) 27  Temp:      TempSrc:      SpO2: 91% 94% (!) 89% 96%  Weight:      Height:        Intake/Output Summary (Last 24 hours) at 04/30/2023 0715 Last data filed at 04/30/2023 0600 Gross per 24 hour  Intake 2377.49 ml  Output 925 ml  Net 1452.49 ml   Filed Weights   04/28/23 0124 04/29/23 0429 04/29/23 1100  Weight: 89.6 kg 93.1 kg 92.4 kg    Physical Exam   GEN: acute distress Neck: No JVD Cardiac: IRIR, systolic murmurs,no rubs, or gallops.  Respiratory: Coarse breath sounds GI: Soft, nontender, non-distended  MS: No edema  Labs   Telemetry: AF RVR with short NSVT and PVCs   Chemistry Recent Labs  Lab 04/28/23 0454 04/29/23 0638 04/30/23 0027  NA 138 134* 131*  K 4.1 4.2 3.8  CL 101 99 97*  CO2 18* 14* 18*  GLUCOSE 110* 112* 182*  BUN 80* 100* 105*  CREATININE 5.76* 6.21* 5.70*  CALCIUM 8.6* 8.1* 7.8*  PROT 6.0* 5.8* 5.6*  ALBUMIN 2.6* 2.3* 2.4*  AST 75* 61* 51*  ALT 31 33 32  ALKPHOS 89 76 78  BILITOT 0.6 0.6 0.6  GFRNONAA 10* 9* 10*  ANIONGAP 19* 21* 16*     Hematology Recent Labs  Lab 04/28/23 0454 04/29/23 0638 04/30/23 0027  WBC 18.9* 22.0* 17.2*  RBC 4.24 4.58 4.27  HGB 12.4* 13.5 12.5*  HCT 37.6* 38.7* 36.6*  MCV 88.7 84.5 85.7  MCH 29.2 29.5 29.3  MCHC 33.0 34.9 34.2  RDW 14.9 15.1 15.1  PLT 187 270 259    Cardiac EnzymesNo results for input(s): "TROPONINI" in the last 168 hours. No results for input(s): "TROPIPOC" in the last 168 hours.   BNPNo results for input(s): "BNP", "PROBNP" in the last 168 hours.   DDimer No results  for input(s): "DDIMER" in the last 168 hours.   Cardiac Studies   Cardiac Studies & Procedures     STRESS TESTS  MYOCARDIAL PERFUSION IMAGING 06/19/2021  Narrative   Findings are equivocal. The study is intermediate risk.   No ST deviation was noted.   LV perfusion is equivocal. There is evidence of ischemia. 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   Left ventricular function is abnormal. There were multiple regional abnormalities. Nuclear stress EF: 39 %. The left ventricular ejection fraction is moderately decreased (30-44%). End systolic cavity size is normal.   Prior study available for comparison from 12/17/2013. No changes compared to prior study.  There is reduced perfusion in the mid-apical inferior myocardium that is slightly worse at stress than rest consistent with infarct with peri-infarct ischemia.  However, this was also noted on stress imaging in 2015.  Wall motion abnormality is more consistent with LBBB than infarct.  Suspect this is related to diaphragmatic attenuation rather than infarct with peri-infarct ischemia.  If clinical suspicion is high, consider coronary CT-A or invasive angiography.   ECHOCARDIOGRAM  ECHOCARDIOGRAM COMPLETE 04/28/2023  Narrative ECHOCARDIOGRAM REPORT  Patient Name:   Bradley Morgan Date of Exam: 04/28/2023 Medical Rec #:  [id number]       Height:       73.0 in Accession #:    [id number]      Weight:       197.5 lb Date of Birth:  1951/08/06       BSA:          2.140 m Patient Age:    72 years        BP:           107/81 mmHg Patient Gender: M               HR:           130 bpm. Exam Location:  Inpatient  Procedure: 2D Echo, Cardiac Doppler and Color Doppler  Indications:    I49.9* Cardiac arrhythmia, unspecified  History:        Patient has prior history of Echocardiogram examinations, most recent 06/09/2021. Abnormal ECG; Signs/Symptoms:Covid.  Sonographer:    Darlys Gales Referring Phys: Youlanda Roys   Sonographer Comments:  Suboptimal parasternal window. IMPRESSIONS   1. Spontaneous echo contrast noted in LV, concerning for low flow state. Left ventricular ejection fraction, by estimation, is 25-30% with septal/lateral dyssynchrony. The left ventricle has severely decreased function. The left ventricle demonstrates global hypokinesis. There is moderate left ventricular hypertrophy. Left ventricular diastolic parameters are indeterminate. 2. Right ventricular systolic function is mildly reduced. The right ventricular size is normal. 3. Left atrial size was moderately dilated. 4. A small pericardial effusion is present. 5. The mitral valve is grossly normal. Trivial mitral valve regurgitation. No evidence of mitral stenosis. 6. The aortic valve is grossly normal. There is mild thickening of the aortic valve. Aortic valve regurgitation is mild. No aortic stenosis is present. 7. IVC not well visualized.  FINDINGS Left Ventricle: Spontaneous echo contrast noted in LV, concerning for low flow state. Left ventricular ejection fraction, by estimation, is 25 to 30%. The left ventricle has severely decreased function. The left ventricle demonstrates global hypokinesis. The left ventricular internal cavity size was normal in size. There is moderate left ventricular hypertrophy. Abnormal (paradoxical) septal motion, consistent with left bundle branch block. Left ventricular diastolic parameters are indeterminate.  Right Ventricle: The right ventricular size is normal. Right vetricular wall thickness was not well visualized. Right ventricular systolic function is mildly reduced.  Left Atrium: Left atrial size was moderately dilated.  Right Atrium: Right atrial size was normal in size.  Pericardium: A small pericardial effusion is present.  Mitral Valve: The mitral valve is grossly normal. Mild mitral annular calcification. Trivial mitral valve regurgitation. No evidence of mitral valve stenosis.  Tricuspid Valve: The  tricuspid valve is grossly normal. Tricuspid valve regurgitation is mild.  Aortic Valve: The aortic valve is grossly normal. There is mild thickening of the aortic valve. Aortic valve regurgitation is mild. No aortic stenosis is present.  Pulmonic Valve: The pulmonic valve was not well visualized. Pulmonic valve regurgitation is trivial.  Aorta: The aortic root was not well visualized.  Venous: IVC not well visualized. The inferior vena cava was not well visualized.  IAS/Shunts: The interatrial septum was not well visualized.   LEFT VENTRICLE PLAX 2D LVIDd:         3.50 cm LV PW:         1.20 cm LV IVS:        1.54 cm LVOT diam:     2.00 cm  LVOT Area:     3.14 cm   LEFT ATRIUM             Index LA Vol (A2C):   65.3 ml 30.51 ml/m LA Vol (A4C):   56.5 ml 26.40 ml/m LA Biplane Vol: 62.2 ml 29.06 ml/m TRICUSPID VALVE TR Peak grad:   25.8 mmHg TR Vmax:        254.00 cm/s  SHUNTS Systemic Diam: 2.00 cm  Weston Brass MD Electronically signed by Weston Brass MD Signature Date/Time: 04/28/2023/5:41:31 PM    Final             Assessment & Plan   Acute on Chronic HFrEF - euvolemic - limiting GDMT for rhabdo-kidney function  AF RVR NSVT - holding BB - continue IV amiodarone - Mg 2.1 - agree with heparin  CAP Sepsis Covid-19 - agree with aggressive therapy  Rhabdomyolysis and renal failure - ok for IV  GOC - family has many questions; patient is critically ill but at this point it is reasonable to continue currently therapy and see how he recovers.  CRITICAL CARE Performed by:  A   Total critical care time: 30 minutes. Critical care time was exclusive of separately billable procedures and treating other patients. Critical care was necessary to treat or prevent imminent or life-threatening deterioration. Critical care was time spent personally by me on the following activities: development of treatment plan with patient and/or  surrogate as well as nursing, discussions with consultants, evaluation of patient's response to treatment, examination of patient, obtaining history from patient or surrogate, ordering and performing treatments and interventions, ordering and review of laboratory studies, ordering and review of radiographic studies, pulse oximetry and re-evaluation of patient's condition.    Signed, Riley Lam, MD FASE [facility] [place]  [facility]  04/30/2023 11:43 AM    For questions or updates, please contact [facility] Please consult [email or web address] for contact info under Cardiology/STEMI.      Riley Lam, MD FASE [facility] Cardiologist [facility]  [address] [place], [address] [place], [place] [address] [phone]  7:15 AM

## 2023-04-30 NOTE — Progress Notes (Signed)
PCCM Progress Note  Worsening respiratory failure. Tripoding. Attempted trial of bipap however continued to have increased work of breathing. Decision made to intubate and place central line for access. Family called and updated on his critical condition.   Communicated with Elink to follow-up CXR, ABG  Care Time: 60 min  Mechele Collin, M.D. [facility] Pulmonary/Critical Care Medicine 04/30/2023 7:08 PM   See Amion for personal pager For hours between 7 PM to 7 AM, please call Elink for urgent questions

## 2023-04-30 NOTE — Procedures (Signed)
Intubation Procedure Note  Bradley Morgan  [id number]  1950-11-10  Date:04/30/23  Time:6:33 PM   Provider Performing: Mechele Collin, MD   Procedure: Intubation (31500)  Indication(s) Respiratory Failure  Consent Risks of the procedure as well as the alternatives and risks of each were explained to the patient and/or caregiver.  Consent for the procedure was obtained and is signed in the bedside chart   Anesthesia Etomidate, Versed, and Fentanyl   Time Out Verified patient identification, verified procedure, site/side was marked, verified correct patient position, special equipment/implants available, medications/allergies/relevant history reviewed, required imaging and test results available.   Sterile Technique Usual hand hygeine, masks, and gloves were used   Procedure Description Patient positioned in bed supine.  Sedation given as noted above.  Patient was intubated with endotracheal tube using Glidescope.  View was Grade 1 full glottis .  Number of attempts was 1.  Colorimetric CO2 detector was consistent with tracheal placement.   Complications/Tolerance None; patient tolerated the procedure well. Chest X-ray is ordered to verify placement.   EBL Minimal   Specimen(s) None

## 2023-04-30 NOTE — Progress Notes (Signed)
eLink Physician-Brief Progress Note Patient Name: Bradley Morgan DOB: Nov 09, 1950 MRN: [id number]   Date of Service  04/30/2023  HPI/Events of Note  Patient c/o anxiety w/ Bipap mask on. he only has an order for prn klonopin for anxiety, which is oral. Asking if he can have something IV to help him relax and keep the bipap mask on?  eICU Interventions  Ordered a one time dose of Ativan 0.5 mg IV     Intervention Category Minor Interventions: Agitation / anxiety - evaluation and management  Rosalie Gums  04/30/2023, 2:25 AM

## 2023-04-30 NOTE — Procedures (Signed)
Central Venous Catheter Insertion Procedure Note  Bradley Morgan  [id number]  12/29/1950  Date:04/30/23  Time:7:06 PM   Provider Performing: Mechele Collin, MD  Procedure: Insertion of Non-tunneled Central Venous [phone]) with US guidance ([id number])   Indication(s) Difficult access  Consent Risks of the procedure as well as the alternatives and risks of each were explained to the patient and/or caregiver.  Consent for the procedure was obtained and is signed in the bedside chart  Anesthesia Topical only with 1% lidocaine   Timeout Verified patient identification, verified procedure, site/side was marked, verified correct patient position, special equipment/implants available, medications/allergies/relevant history reviewed, required imaging and test results available.  Sterile Technique Maximal sterile technique including full sterile barrier drape, hand hygiene, sterile gown, sterile gloves, mask, hair covering, sterile ultrasound probe cover (if used).  Procedure Description Area of catheter insertion was cleaned with chlorhexidine and draped in sterile fashion.  With real-time ultrasound guidance a central venous catheter was placed into the left internal jugular vein. Nonpulsatile blood flow and easy flushing noted in all ports.  The catheter was sutured in place and sterile dressing applied.  Complications/Tolerance None; patient tolerated the procedure well. Chest X-ray is ordered to verify placement for internal jugular or subclavian cannulation.   Chest x-ray is not ordered for femoral cannulation.  EBL Minimal  Specimen(s) None

## 2023-04-30 NOTE — Progress Notes (Signed)
NAME:  Bradley Morgan, MRN:  [id number], DOB:  August 06, 1951, LOS: 2 ADMISSION DATE:  04/27/2023, CONSULTATION DATE:  04/28/23 REFERRING MD:  Olevia Bowens, TRH, CHIEF COMPLAINT:  Respiratory distress   History of Present Illness:  Bradley Morgan is a 72 yo male with a PMH of CHF (EF 45%), HTN, HLD, CKD3a, and chronic LBBB who presented for progressive AMS, lethargy, and SOB over the last 7 days. Presented to ED and found to be afebrile, tachycardic to 140 with evidence of new-onset afib with RVR, tachypnea to 25, and hypoxemia to 90-93% O2 requiring 3L. WBC 22.6, Cr 6.6, lactic acid 3.2. Patient is COVID+. VBG with normal pH, low CO2, low bicarb. CXR with large consolidation in RLL. CT head normal. He was admitted to progressive on IV azithro and CTX for PNA. Outside window for remdesevir. Rates more reassuring at that time so no rate control given. Once on the floor, rapid was called for worsening respiratory distress now requiring 6L. He remained in afib with RVR. PCCM was ultimately consulted for further recommendations.  Pertinent  Medical History   Past Medical History:  Diagnosis Date   Adrenal adenoma, left    Arthritis    Bursitis of elbow    Complication of anesthesia    ANESTHESIA DID NOT TAKE EFFECT WITH BACK SURGERY "I FELT THEM CUT"   Congenital absence of right kidney    Coronary artery disease    Diverticulosis of colon    Family history of anesthesia complication    "hard time waking up my dad" many yrs ago   Frequency of urination    Full dentures    GAD (generalized anxiety disorder)    GERD (gastroesophageal reflux disease)    History of acute renal failure    12-16-2013;  09-24-2017 due to stone obstruction   History of duodenal ulcer 04/2000   w/ gi bleed post egd w/ cauterization (caused by chronic nsaid use)   History of encephalopathy 04/26/2014   ACUTE EPISODE SECONDARY TO DRUG OVERDOSE (NARC, BZD, FLEXERIL)   History of GI bleed    08/ 2001   duodenal ulcer bleed post  egd w/ cauterization   History of kidney stones    History of prostatitis    Hyperlipidemia    Hypertension    Left bundle branch block 05/05/2021   Left ureteral stone    Nocturia more than twice per night    Peripheral neuropathy    bilateral leg numbness due to back issues, walks w/ cane   Pneumonia    PONV (postoperative nausea and vomiting)    Restless leg syndrome    Solitary left kidney    Wears glasses    Significant Hospital Events: Including procedures, antibiotic start and stop dates in addition to other pertinent events   8/8 - admitted with PNA on IV abx, resp distress worsened requiring 3>6L O2 necessitating PCCM consult  04/29/2023 transferred to intensive care unit  Interim History / Subjective:  Remains on HHFNC 70% 40L Used BiPAP overnight. Had some anxiety  Objective   Blood pressure 111/68, pulse (!) 109, temperature 97.9 F (36.6 C), temperature source Axillary, resp. rate (!) 24, height 6\' 1"  (1.854 m), weight 92.4 kg, SpO2 94%.    FiO2 (%):  [70 %-80 %] 70 % PEEP:  [6 cmH20] 6 cmH20 Pressure Support:  [8 cmH20] 8 cmH20   Intake/Output Summary (Last 24 hours) at 04/30/2023 1014 Last data filed at 04/30/2023 0800 Gross per 24 hour  Intake 2603.46 ml  Output 925 ml  Net 1678.46 ml   Filed Weights   04/28/23 0124 04/29/23 0429 04/29/23 1100  Weight: 89.6 kg 93.1 kg 92.4 kg    Physical Exam: General: Elderly chronically ill-appearing, fatigued, no acute distress HENT: , AT, OP clear, MMM, HHFNC Eyes: EOMI, no scleral icterus Respiratory: Diminished bases bilaterally Cardiovascular: Irregular rate and rhythm, -M/R/G, no JVD GI: BS+, soft, nontender Extremities:-Edema,-tenderness Neuro: AAO x4, CNII-XII grossly intact Psych: Normal mood, normal affect  Na 131 CO2 14>18 BUN/Cr 105/5.7 improving AST 51 slightly improving  Assessment & Plan:  Sepsis due to streptococcal pneumonia Acute hypoxemic respiratory failure - remains on high O2  requirements Acute metabolic encephalopathy - improved COVID-19 Former smoker -Monitor respiratory status for distress or failure. High risk for intuabtion -Wean heated high flow O2 for goal >85% -Ceftriaxone -Decadron -Pulmonary toilet -Scheduled xopenex nebs -Add Pulmicort BID -F/u blood cultres  Paroxysmal atrial fibrillation with RVR - improved control Chronic LBBB Chronic systolic heart failure (EF 44% 2022> 25-30%) HTN - hold home due soft pressures -Tele -Cardiology following -Coreg and amio gtt -IV heparin  AoCKD3a with metabolic acidosis - improving Solitary kidney -No acute dialysis -LR @75  cc/hr -Encouraged PO intake -Sodium bicarb tabs BID -Trend -Monitor UOP/Cr  Hyponatremia -Trend -IVF fluid resuscitation  Anxiety PRN klonopin   ?Chronic pain -Gabapentin 100 mg BID -Holding home flexiril -PRN norco  Critical care time: 40 min    Diet: Regular DVT: systemic heparin  GI: Home protonix BID  The patient is critically ill with multiple organ systems failure and requires high complexity decision making for assessment and support, frequent evaluation and titration of therapies, application of advanced monitoring technologies and extensive interpretation of multiple databases.  Independent Critical Care Time: 40 Minutes.   Mechele Collin, M.D. [facility] Pulmonary/Critical Care Medicine 04/30/2023 10:14 AM   Please see Amion for pager number to reach on-call Pulmonary and Critical Care Team.

## 2023-04-30 NOTE — Progress Notes (Signed)
ANTICOAGULATION CONSULT NOTE   Pharmacy Consult for Heparin  Indication: atrial fibrillation  Allergies  Allergen Reactions   Aspirin Other (See Comments)    Stomach ulcers. No high doses   Nsaids Other (See Comments)    CAUSES STOMACH ULCERS     Patient Measurements: Height: 6\' 1"  (185.4 cm) Weight: 92.4 kg (203 lb 11.3 oz) IBW/kg (Calculated) : 79.9  Vital Signs: Temp: 98.4 F (36.9 C) (08/09 2320) Temp Source: Oral (08/09 2320) BP: 119/81 (08/10 0000) Pulse Rate: 108 (08/10 0030)  Labs: Recent Labs    04/28/23 0454 04/28/23 1102 04/29/23 0638 04/29/23 1352 04/30/23 0027  HGB 12.4*  --  13.5  --  12.5*  HCT 37.6*  --  38.7*  --  36.6*  PLT 187  --  270  --  259  HEPARINUNFRC  --    < > 0.24* 0.28* 0.42  CREATININE 5.76*  --  6.21*  --  5.70*  CKTOTAL 4,057*  --  2,671*  --   --    < > = values in this interval not displayed.    Estimated Creatinine Clearance: 13.6 mL/min (A) (by C-G formula based on SCr of 5.7 mg/dL (H)).   Medical History: Past Medical History:  Diagnosis Date   Adrenal adenoma, left    Arthritis    Bursitis of elbow    Complication of anesthesia    ANESTHESIA DID NOT TAKE EFFECT WITH BACK SURGERY "I FELT THEM CUT"   Congenital absence of right kidney    Coronary artery disease    Diverticulosis of colon    Family history of anesthesia complication    "hard time waking up my dad" many yrs ago   Frequency of urination    Full dentures    GAD (generalized anxiety disorder)    GERD (gastroesophageal reflux disease)    History of acute renal failure    12-16-2013;  09-24-2017 due to stone obstruction   History of duodenal ulcer 04/2000   w/ gi bleed post egd w/ cauterization (caused by chronic nsaid use)   History of encephalopathy 04/26/2014   ACUTE EPISODE SECONDARY TO DRUG OVERDOSE (NARC, BZD, FLEXERIL)   History of GI bleed    08/ 2001   duodenal ulcer bleed post egd w/ cauterization   History of kidney stones    History of  prostatitis    Hyperlipidemia    Hypertension    Left bundle branch block 05/05/2021   Left ureteral stone    Nocturia more than twice per night    Peripheral neuropathy    bilateral leg numbness due to back issues, walks w/ cane   Pneumonia    PONV (postoperative nausea and vomiting)    Restless leg syndrome    Solitary left kidney    Wears glasses     Assessment: 72 y/o M with COVID-19, acute on chronic kidney failure, and new onset afib. Starting heparin. PTA meds reviewed. Labs above reviewed.  8/10 AM update:  Heparin level therapeutic  Goal of Therapy:  Heparin level 0.3-0.7 units/ml Monitor platelets by anticoagulation protocol: Yes   Plan:  Cont heparin 2200 units/hr Heparin level with AM labs  Abran Duke, PharmD, BCPS Clinical Pharmacist Phone: [phone]

## 2023-04-30 NOTE — Progress Notes (Signed)
Subjective:  got moved to the ICU-  hemodynamically stable- req transient  bipap now back on high flow O2-  at least 925 of UOP -  BUN stable and crt down -  he is alert, no distress but confused  Objective Vital signs in last 24 hours: Vitals:   04/30/23 0530 04/30/23 0600 04/30/23 0630 04/30/23 0734  BP:  (!) 108/95    Pulse: (!) 102 (!) 117 (!) 148   Resp: (!) 22 (!) 27 (!) 27   Temp:    97.9 F (36.6 C)  TempSrc:    Axillary  SpO2: 94% (!) 89% 96%   Weight:      Height:       Weight change: -0.7 kg  Intake/Output Summary (Last 24 hours) at 04/30/2023 0747 Last data filed at 04/30/2023 0700 Gross per 24 hour  Intake 2489.74 ml  Output 925 ml  Net 1564.74 ml    Assessment/ Plan: Pt is a 72 y.o. yo male with ischemic cardiomyopathy, A fib, solitary kidney with baseline stage 3 CKD  who was admitted on 04/27/2023 with confusion-  diagnosed with bacterial PNA and covid-  also found to have worsening EF and Afib with RVR  Assessment/Plan: 1. Renal-  crt 1.6 in May of 23, 1.22 in April of 24.  Now with  A on CRF in the setting of PNA, Afib with RVR-  imaging does not show hydro-  urine with 100 of protein- no RBC, min WBC.  Renal function trending worse since admit but non oliguric.  Suspect mostly hemodynamic given the scenario but also an element of rhabdo-  was on ARB as OP-  has been held.    Not quite to dialysis indications but close.  I would say has a high liklihood of progressing to needing dialysis this admission and given soft BP will likely need CRRT.  The last 24 hours making pretty good urine- crt down-  no acute dialysis needs today-  continue to follow  2. Metabolic acidosis- elevated lactate got a liter of LR and has continued with IVF to support BP and acidosis-  also added oral bicarb-  moving in right direction 3. Anemia-  surprisingly good-  indicates likely dry   4. HTN/volume- if anything seems dry-  gentle IVF 5. Hyponatremia-  curious has developed with giving LR  an isotonic fluid-  follow  6 ID-  given tx for  covid and PNA-  WBC improving   Cecille Aver    Labs: Basic Metabolic Panel: Recent Labs  Lab 04/28/23 0454 04/28/23 2117 04/29/23 0638 04/30/23 0027  NA 138  --  134* 131*  K 4.1  --  4.2 3.8  CL 101  --  99 97*  CO2 18*  --  14* 18*  GLUCOSE 110*  --  112* 182*  BUN 80*  --  100* 105*  CREATININE 5.76*  --  6.21* 5.70*  CALCIUM 8.6*  --  8.1* 7.8*  PHOS  --  6.7*  --   --    Liver Function Tests: Recent Labs  Lab 04/28/23 0454 04/29/23 0638 04/30/23 0027  AST 75* 61* 51*  ALT 31 33 32  ALKPHOS 89 76 78  BILITOT 0.6 0.6 0.6  PROT 6.0* 5.8* 5.6*  ALBUMIN 2.6* 2.3* 2.4*   No results for input(s): "LIPASE", "AMYLASE" in the last 168 hours. No results for input(s): "AMMONIA" in the last 168 hours. CBC: Recent Labs  Lab 04/27/23 0020 04/28/23 0031 04/28/23 4782  04/29/23 0638 04/30/23 0027  WBC 22.6*  --  18.9* 22.0* 17.2*  NEUTROABS 20.2*  --  17.4*  --   --   HGB 14.0   < > 12.4* 13.5 12.5*  HCT 41.4   < > 37.6* 38.7* 36.6*  MCV 88.1  --  88.7 84.5 85.7  PLT 237  --  187 270 259   < > = values in this interval not displayed.   Cardiac Enzymes: Recent Labs  Lab 04/28/23 0454 04/29/23 0638  CKTOTAL 4,057* 2,671*   CBG: Recent Labs  Lab 04/29/23 1103  GLUCAP 121*    Iron Studies: No results for input(s): "IRON", "TIBC", "TRANSFERRIN", "FERRITIN" in the last 72 hours. Studies/Results: DG Chest Port 1V same Day  Result Date: 04/29/2023 CLINICAL DATA:  72 year old male with shortness of breath. EXAM: PORTABLE CHEST 1 VIEW COMPARISON:  Portable chest 04/28/2023 and earlier. FINDINGS: Portable AP semi upright view at 0652 hours. Ongoing low lung volumes. Stable mediastinal contours with suspicion of cardiomegaly. Visualized tracheal air column is within normal limits. Asymmetry of the diaphragm which might reflect mild elevation on the right side, although small right pleural effusion is difficult  to exclude. Confluent medial right lung base airspace disease seen on 04/27/2023 likely on going, with possible air bronchograms along the right heart border. Upper lungs remain clear. No pneumothorax. Stable vascularity without edema. No definite left lung consolidation. Paucity of bowel gas in the upper abdomen. Stable visualized osseous structures. Postoperative changes to the cervical spine and right shoulder. IMPRESSION: 1. Evidence of ongoing right lung base consolidation/pneumonia. Some underlying chronic elevation of the right hemidiaphragm. Small if any right pleural effusion. 2. Low lung volumes with no new cardiopulmonary abnormality identified. Electronically Signed   By: Odessa Fleming M.D.   On: 04/29/2023 07:10   ECHOCARDIOGRAM COMPLETE  Result Date: 04/28/2023    ECHOCARDIOGRAM REPORT   Patient Name:   Bradley Morgan Date of Exam: 04/28/2023 Medical Rec #:  [id number]       Height:       73.0 in Accession #:    [id number]      Weight:       197.5 lb Date of Birth:  [birth year]       BSA:          2.140 m Patient Age:    72 years        BP:           107/81 mmHg Patient Gender: M               HR:           130 bpm. Exam Location:  Inpatient Procedure: 2D Echo, Cardiac Doppler and Color Doppler Indications:    I49.9* Cardiac arrhythmia, unspecified  History:        Patient has prior history of Echocardiogram examinations, most                 recent 06/09/2021. Abnormal ECG; Signs/Symptoms:Covid.  Sonographer:    Darlys Gales Referring Phys: Youlanda Roys  Sonographer Comments: Suboptimal parasternal window. IMPRESSIONS  1. Spontaneous echo contrast noted in LV, concerning for low flow state. Left ventricular ejection fraction, by estimation, is 25-30% with septal/lateral dyssynchrony. The left ventricle has severely decreased function. The left ventricle demonstrates global hypokinesis. There is moderate left ventricular hypertrophy. Left ventricular diastolic parameters are indeterminate.  2. Right  ventricular systolic function is mildly reduced. The right ventricular size is normal.  3. Left atrial  size was moderately dilated.  4. A small pericardial effusion is present.  5. The mitral valve is grossly normal. Trivial mitral valve regurgitation. No evidence of mitral stenosis.  6. The aortic valve is grossly normal. There is mild thickening of the aortic valve. Aortic valve regurgitation is mild. No aortic stenosis is present.  7. IVC not well visualized. FINDINGS  Left Ventricle: Spontaneous echo contrast noted in LV, concerning for low flow state. Left ventricular ejection fraction, by estimation, is 25 to 30%. The left ventricle has severely decreased function. The left ventricle demonstrates global hypokinesis. The left ventricular internal cavity size was normal in size. There is moderate left ventricular hypertrophy. Abnormal (paradoxical) septal motion, consistent with left bundle branch block. Left ventricular diastolic parameters are indeterminate. Right Ventricle: The right ventricular size is normal. Right vetricular wall thickness was not well visualized. Right ventricular systolic function is mildly reduced. Left Atrium: Left atrial size was moderately dilated. Right Atrium: Right atrial size was normal in size. Pericardium: A small pericardial effusion is present. Mitral Valve: The mitral valve is grossly normal. Mild mitral annular calcification. Trivial mitral valve regurgitation. No evidence of mitral valve stenosis. Tricuspid Valve: The tricuspid valve is grossly normal. Tricuspid valve regurgitation is mild. Aortic Valve: The aortic valve is grossly normal. There is mild thickening of the aortic valve. Aortic valve regurgitation is mild. No aortic stenosis is present. Pulmonic Valve: The pulmonic valve was not well visualized. Pulmonic valve regurgitation is trivial. Aorta: The aortic root was not well visualized. Venous: IVC not well visualized. The inferior vena cava was not well  visualized. IAS/Shunts: The interatrial septum was not well visualized.  LEFT VENTRICLE PLAX 2D LVIDd:         3.50 cm LV PW:         1.20 cm LV IVS:        1.54 cm LVOT diam:     2.00 cm LVOT Area:     3.14 cm  LEFT ATRIUM             Index LA Vol (A2C):   65.3 ml 30.51 ml/m LA Vol (A4C):   56.5 ml 26.40 ml/m LA Biplane Vol: 62.2 ml 29.06 ml/m  TRICUSPID VALVE TR Peak grad:   25.8 mmHg TR Vmax:        254.00 cm/s  SHUNTS Systemic Diam: 2.00 cm Weston Brass MD Electronically signed by Weston Brass MD Signature Date/Time: 04/28/2023/5:41:31 PM    Final    DG Chest Port 1 View  Result Date: 04/28/2023 CLINICAL DATA:  Shortness of breath EXAM: PORTABLE CHEST 1 VIEW COMPARISON:  Chest x-ray 04/27/2023 FINDINGS: The heart is enlarged, unchanged. Small right pleural effusion has decreased. Right basilar infiltrate has resolved. Left lung is clear. No pneumothorax. Osseous structures are stable. IMPRESSION: Small right pleural effusion has decreased. Right basilar infiltrate has resolved. Electronically Signed   By: Darliss Cheney M.D.   On: 04/28/2023 15:46   Medications: Infusions:  amiodarone 30 mg/hr (04/30/23 0700)   cefTRIAXone (ROCEPHIN)  IV Stopped (04/29/23 1756)   heparin 2,200 Units/hr (04/30/23 0700)   lactated ringers 75 mL/hr at 04/30/23 0700    Scheduled Medications:  Chlorhexidine Gluconate Cloth  6 each Topical Daily   dexamethasone  6 mg Oral Daily   gabapentin  100 mg Oral BID   levalbuterol  0.63 mg Nebulization Q6H   pantoprazole  40 mg Oral BID   rOPINIRole  1 mg Oral QHS   sodium bicarbonate  650 mg  Oral BID    have reviewed scheduled and prn medications.  Physical Exam: General: pale, large O2 req-  less distress than yesterday but he is confused -  initially said 2021 , then 2024-  says is hungry but just stares at the food in from of him Heart: irreg Lungs: CBS bilat  Abdomen: soft, non tender Extremities: no edema    04/30/2023,7:47 AM  LOS: 2 days

## 2023-04-30 NOTE — Progress Notes (Signed)
eLink Physician-Brief Progress Note Patient Name: Bradley Morgan DOB: 07-01-1951 MRN: [id number]   Date of Service  04/30/2023  HPI/Events of Note  BMP/ Mg is back. K was 3.8 and Calcium 7.8. Mag 2.1. Still in Afib w/ PVC's. Corrected calcium 9.1  Creatinine 5.7  eICU Interventions  Continue amiodarone drip for now     Intervention Category Intermediate Interventions: Arrhythmia - evaluation and management  Irving Burton T  04/30/2023, 1:30 AM

## 2023-05-01 DIAGNOSIS — J189 Pneumonia, unspecified organism: Secondary | ICD-10-CM | POA: Diagnosis not present

## 2023-05-01 LAB — POCT I-STAT 7, (LYTES, BLD GAS, ICA,H+H)
Acid-base deficit: 6 mmol/L — ABNORMAL HIGH (ref 0.0–2.0)
Bicarbonate: 18.3 mmol/L — ABNORMAL LOW (ref 20.0–28.0)
Calcium, Ion: 1.1 mmol/L — ABNORMAL LOW (ref 1.15–1.40)
HCT: 34 % — ABNORMAL LOW (ref 39.0–52.0)
Hemoglobin: 11.6 g/dL — ABNORMAL LOW (ref 13.0–17.0)
O2 Saturation: 98 %
Patient temperature: 98.8
Potassium: 4.6 mmol/L (ref 3.5–5.1)
Sodium: 138 mmol/L (ref 135–145)
TCO2: 19 mmol/L — ABNORMAL LOW (ref 22–32)
pCO2 arterial: 32.9 mmHg (ref 32–48)
pH, Arterial: 7.353 (ref 7.35–7.45)
pO2, Arterial: 117 mmHg — ABNORMAL HIGH (ref 83–108)

## 2023-05-01 LAB — CBC
HCT: 35.3 % — ABNORMAL LOW (ref 39.0–52.0)
Hemoglobin: 11.8 g/dL — ABNORMAL LOW (ref 13.0–17.0)
MCH: 29.3 pg (ref 26.0–34.0)
MCHC: 33.4 g/dL (ref 30.0–36.0)
MCV: 87.6 fL (ref 80.0–100.0)
Platelets: 274 10*3/uL (ref 150–400)
RBC: 4.03 MIL/uL — ABNORMAL LOW (ref 4.22–5.81)
RDW: 15.5 % (ref 11.5–15.5)
WBC: 11.6 10*3/uL — ABNORMAL HIGH (ref 4.0–10.5)
nRBC: 0.3 % — ABNORMAL HIGH (ref 0.0–0.2)

## 2023-05-01 LAB — COMPREHENSIVE METABOLIC PANEL WITH GFR
ALT: 27 U/L (ref 0–44)
AST: 28 U/L (ref 15–41)
Albumin: 2.1 g/dL — ABNORMAL LOW (ref 3.5–5.0)
Alkaline Phosphatase: 70 U/L (ref 38–126)
Anion gap: 19 — ABNORMAL HIGH (ref 5–15)
BUN: 99 mg/dL — ABNORMAL HIGH (ref 8–23)
CO2: 18 mmol/L — ABNORMAL LOW (ref 22–32)
Calcium: 8.3 mg/dL — ABNORMAL LOW (ref 8.9–10.3)
Chloride: 103 mmol/L (ref 98–111)
Creatinine, Ser: 4.52 mg/dL — ABNORMAL HIGH (ref 0.61–1.24)
GFR, Estimated: 13 mL/min — ABNORMAL LOW (ref >60)
Glucose, Bld: 112 mg/dL — ABNORMAL HIGH (ref 70–99)
Potassium: 4.5 mmol/L (ref 3.5–5.1)
Sodium: 140 mmol/L (ref 135–145)
Total Bilirubin: 0.5 mg/dL (ref 0.3–1.2)
Total Protein: 5.2 g/dL — ABNORMAL LOW (ref 6.5–8.1)

## 2023-05-01 LAB — HEPARIN LEVEL (UNFRACTIONATED): Heparin Unfractionated: 0.59 [IU]/mL (ref 0.30–0.70)

## 2023-05-01 LAB — GLUCOSE, CAPILLARY
Glucose-Capillary: 122 mg/dL — ABNORMAL HIGH (ref 70–99)
Glucose-Capillary: 124 mg/dL — ABNORMAL HIGH (ref 70–99)
Glucose-Capillary: 125 mg/dL — ABNORMAL HIGH (ref 70–99)

## 2023-05-01 LAB — PHOSPHORUS
Phosphorus: 5.5 mg/dL — ABNORMAL HIGH (ref 2.5–4.6)
Phosphorus: 5.4 mg/dL — ABNORMAL HIGH (ref 2.5–4.6)

## 2023-05-01 LAB — MAGNESIUM
Magnesium: 2 mg/dL (ref 1.7–2.4)
Magnesium: 1.9 mg/dL (ref 1.7–2.4)

## 2023-05-01 MED ORDER — VITAL AF 1.2 CAL PO LIQD
1000.0000 mL | ORAL | Status: DC
  Administered 2023-05-01: 1000 mL

## 2023-05-01 MED ORDER — LACTATED RINGERS IV BOLUS
1000.0000 mL | Freq: Once | INTRAVENOUS | Status: AC
  Administered 2023-05-01: 1000 mL via INTRAVENOUS

## 2023-05-01 MED ORDER — LACTATED RINGERS IV BOLUS: 1000.0000 mL | Freq: Once | INTRAVENOUS | Status: DC

## 2023-05-01 MED ORDER — PANTOPRAZOLE SODIUM 40 MG IV SOLR
40.0000 mg | Freq: Two times a day (BID) | INTRAVENOUS | Status: DC
  Administered 2023-05-01 – 2023-05-06 (×11): 40 mg via INTRAVENOUS
  Filled 2023-05-01 (×11): qty 10

## 2023-05-01 MED ORDER — LEVALBUTEROL HCL 0.63 MG/3ML IN NEBU
0.6300 mg | INHALATION_SOLUTION | Freq: Two times a day (BID) | RESPIRATORY_TRACT | Status: DC
  Administered 2023-05-02 – 2023-05-06 (×9): 0.63 mg via RESPIRATORY_TRACT
  Filled 2023-05-01 (×11): qty 3

## 2023-05-01 MED ORDER — VITAL HIGH PROTEIN PO LIQD: 1000.0000 mL | ORAL | Status: DC

## 2023-05-01 MED ORDER — PROSOURCE TF20 ENFIT COMPATIBL EN LIQD
60.0000 mL | Freq: Every day | ENTERAL | Status: DC
  Administered 2023-05-01 – 2023-05-06 (×6): 60 mL
  Filled 2023-05-01 (×6): qty 60

## 2023-05-01 MED ORDER — AMIODARONE HCL IN DEXTROSE 360-4.14 MG/200ML-% IV SOLN
30.0000 mg/h | INTRAVENOUS | Status: DC
  Administered 2023-05-01 (×2): 30 mg/h via INTRAVENOUS
  Filled 2023-05-01 (×2): qty 200

## 2023-05-01 MED ORDER — LACTATED RINGERS IV SOLN: INTRAVENOUS | Status: DC

## 2023-05-01 NOTE — Progress Notes (Signed)
ANTICOAGULATION CONSULT NOTE   Pharmacy Consult for Heparin  Indication: atrial fibrillation  Allergies  Allergen Reactions   Aspirin Other (See Comments)    Stomach ulcers. No high doses   Nsaids Other (See Comments)    CAUSES STOMACH ULCERS     Patient Measurements: Height: 6\' 1"  (185.4 cm) Weight: 98.1 kg (216 lb 4.3 oz) IBW/kg (Calculated) : 79.9  Vital Signs: Temp: 99.2 F (37.3 C) (08/11 1100) Temp Source: Oral (08/11 1100) BP: 145/88 (08/11 1406) Pulse Rate: 111 (08/11 1406)  Labs: Recent Labs    04/29/23 5284 04/29/23 1352 04/30/23 0027 04/30/23 0647 04/30/23 1947 05/01/23 0437 05/01/23 0453  HGB 13.5  --  12.5*  --  13.6 11.6* 11.8*  HCT 38.7*  --  36.6*  --  40.0 34.0* 35.3*  PLT 270  --  259  --   --   --  274  HEPARINUNFRC 0.24*   < > 0.42 0.50  --   --  0.59  CREATININE 6.21*  --  5.70*  --   --   --  4.52*  CKTOTAL 2,671*  --   --   --   --   --   --    < > = values in this interval not displayed.    Estimated Creatinine Clearance: 18.8 mL/min (A) (by C-G formula based on SCr of 4.52 mg/dL (H)).   Medical History: Past Medical History:  Diagnosis Date   Adrenal adenoma, left    Arthritis    Bursitis of elbow    Complication of anesthesia    ANESTHESIA DID NOT TAKE EFFECT WITH BACK SURGERY "I FELT THEM CUT"   Congenital absence of right kidney    Coronary artery disease    Diverticulosis of colon    Family history of anesthesia complication    "hard time waking up my dad" many yrs ago   Frequency of urination    Full dentures    GAD (generalized anxiety disorder)    GERD (gastroesophageal reflux disease)    History of acute renal failure    12-16-2013;  09-24-2017 due to stone obstruction   History of duodenal ulcer 04/2000   w/ gi bleed post egd w/ cauterization (caused by chronic nsaid use)   History of encephalopathy 04/26/2014   ACUTE EPISODE SECONDARY TO DRUG OVERDOSE (NARC, BZD, FLEXERIL)   History of GI bleed    08/ 2001    duodenal ulcer bleed post egd w/ cauterization   History of kidney stones    History of prostatitis    Hyperlipidemia    Hypertension    Left bundle branch block 05/05/2021   Left ureteral stone    Nocturia more than twice per night    Peripheral neuropathy    bilateral leg numbness due to back issues, walks w/ cane   Pneumonia    PONV (postoperative nausea and vomiting)    Restless leg syndrome    Solitary left kidney    Wears glasses     Assessment: 72 y/o M with COVID-19, acute on chronic kidney failure, and new onset afib. Starting heparin. PTA meds reviewed. Labs above reviewed.  8/11 update:  Heparin level therapeutic  Goal of Therapy:  Heparin level 0.3-0.7 units/ml Monitor platelets by anticoagulation protocol: Yes   Plan:  Cont heparin 2200 units/hr Heparin level with AM labs  Rutherford Nail, PharmD PGY2 Critical Care Pharmacy Resident Phone: [phone]

## 2023-05-01 NOTE — Progress Notes (Signed)
Subjective:  ended up getting intubated-   not a big surprise-  only 600 of urine recorded which is less but BUN and crt down - BP soft  Objective Vital signs in last 24 hours: Vitals:   05/01/23 0545 05/01/23 0600 05/01/23 0700 05/01/23 0741  BP:  102/71 99/66   Pulse: 77 80 78   Resp: (!) 22 20 (!) 24   Temp:    99.5 F (37.5 C)  TempSrc:    Axillary  SpO2: 92% 92% 92%   Weight:      Height:       Weight change: 5.7 kg  Intake/Output Summary (Last 24 hours) at 05/01/2023 0751 Last data filed at 05/01/2023 0700 Gross per 24 hour  Intake 1955.01 ml  Output 600 ml  Net 1355.01 ml    Assessment/ Plan: Pt is a 72 y.o. yo male with ischemic cardiomyopathy, A fib, solitary kidney with baseline stage 3 CKD  who was admitted on 05/26/23 with confusion-  diagnosed with bacterial PNA and covid-  also found to have worsening EF and Afib with RVR  Assessment/Plan: 1. Renal-  crt 1.6 in May of 23, 1.22 in April of 24.  Now with  A on CRF in the setting of PNA, Afib with RVR-  imaging does not show hydro-  urine with 100 of protein- no RBC, min WBC.  Renal function trending worse since admit but non oliguric.  Suspect mostly hemodynamic given the scenario but also an element of rhabdo-  was on ARB as OP-  has been held.    Not quite to dialysis indications but close.  Thought he had a high risk of progressing to needing dialysis but has held his own so far.  The last 24 hours making less urine- but crt down-  no acute dialysis needs today-  continue to follow  2. Metabolic acidosis- elevated lactate got a liter of LR and has continued with IVF to support BP and acidosis-  also added oral bicarb-  moving in right direction 3. Anemia-  surprisingly good-  indicates was likely dry   4. HTN/volume- seems euvolemic-  not overloaded   5 ID-  given tx for  covid and PNA-  WBC improving   6. Resp failure-  intubated on 8/10  [place] Monds A Stefano Trulson    Labs: Basic Metabolic Panel: Recent Labs  Lab  04/28/23 2117 04/29/23 7829 04/30/23 0027 04/30/23 1947 05/01/23 0437 05/01/23 0453  NA  --  134* 131* 137 138 140  K  --  4.2 3.8 4.1 4.6 4.5  CL  --  99 97*  --   --  103  CO2  --  14* 18*  --   --  18*  GLUCOSE  --  112* 182*  --   --  112*  BUN  --  100* 105*  --   --  99*  CREATININE  --  6.21* 5.70*  --   --  4.52*  CALCIUM  --  8.1* 7.8*  --   --  8.3*  PHOS 6.7*  --   --   --   --   --    Liver Function Tests: Recent Labs  Lab 04/29/23 0638 04/30/23 0027 05/01/23 0453  AST 61* 51* 28  ALT 33 32 27  ALKPHOS 76 78 70  BILITOT 0.6 0.6 0.5  PROT 5.8* 5.6* 5.2*  ALBUMIN 2.3* 2.4* 2.1*   No results for input(s): "LIPASE", "AMYLASE" in the last 168 hours.  No results for input(s): "AMMONIA" in the last 168 hours. CBC: Recent Labs  Lab 04/22/2023 0020 04/28/23 0031 04/28/23 0454 04/29/23 1610 04/30/23 0027 04/30/23 1947 05/01/23 0437 05/01/23 0453  WBC 22.6*  --  18.9* 22.0* 17.2*  --   --  11.6*  NEUTROABS 20.2*  --  17.4*  --   --   --   --   --   HGB 14.0   < > 12.4* 13.5 12.5* 13.6 11.6* 11.8*  HCT 41.4   < > 37.6* 38.7* 36.6* 40.0 34.0* 35.3*  MCV 88.1  --  88.7 84.5 85.7  --   --  87.6  PLT 237  --  187 270 259  --   --  274   < > = values in this interval not displayed.   Cardiac Enzymes: Recent Labs  Lab 04/28/23 0454 04/29/23 0638  CKTOTAL 4,057* 2,671*   CBG: Recent Labs  Lab 04/29/23 1103 04/30/23 1933 04/30/23 2309 05/01/23 0326  GLUCAP 121* 136* 116* 125*    Iron Studies: No results for input(s): "IRON", "TIBC", "TRANSFERRIN", "FERRITIN" in the last 72 hours. Studies/Results: DG Abd Portable 1V  Result Date: 04/30/2023 CLINICAL DATA:  [id number].  Encounter for orogastric tube placement. EXAM: PORTABLE ABDOMEN - 1 VIEW COMPARISON:  Earlier study today at 7:23 p.m. FINDINGS: Single high transverse abdomen at 9:33 p.m. NG tube enters the stomach coursing to the left with the tip abutting the wall of the proximal fundus. The bowel pattern is  nonobstructive. There are patchy calcifications in the aorta and splenic artery. No nephrolithiasis is seen. There is no supine evidence of free air. There is advanced degenerative change and levoscoliosis of the lumbar spine. Consolidation in the left lower lobe is present and a small left pleural effusion. IMPRESSION: 1. NG tube enters the stomach coursing to the left with the tip abutting the wall of the proximal fundus. 2. Nonobstructive bowel gas pattern.  Incidental findings as above. Electronically Signed   By: Almira Bar M.D.   On: 04/30/2023 21:46   DG Abd Portable 1V  Result Date: 04/30/2023 CLINICAL DATA:  Check gastric catheter placement EXAM: PORTABLE ABDOMEN - 1 VIEW COMPARISON:  None Available. FINDINGS: Gastric catheter is noted in the distal esophagus. This should be advanced several cm deeper into the stomach. IMPRESSION: Gastric catheter in the distal esophagus as described. Electronically Signed   By: Alcide Clever M.D.   On: 04/30/2023 19:45   DG CHEST PORT 1 VIEW  Result Date: 04/30/2023 CLINICAL DATA:  Check gastric catheter placement EXAM: PORTABLE CHEST 1 VIEW COMPARISON:  04/29/2023 FINDINGS: Endotracheal tube is noted in satisfactory position. Left jugular central line is seen with the catheter tip at the junction of the innominate veins. No pneumothorax is noted. Gastric catheter lies in the distal esophagus and should be advanced several cm deeper into the stomach. Persistent airspace opacity is noted in the bases bilaterally somewhat improved on the right but somewhat worsened in the left retrocardiac region. No other focal abnormality is noted. IMPRESSION: Tubes and lines as described above. Gastric catheter should be advanced deeper into the stomach. Bibasilar airspace opacities somewhat improved on the right and somewhat worsened on the left. Electronically Signed   By: Alcide Clever M.D.   On: 04/30/2023 19:44   [place] EKG SITE RITE  Result Date: 04/30/2023 If Site Rite  image not attached, placement could not be confirmed due to current cardiac rhythm.  Medications: Infusions:  cefTRIAXone (ROCEPHIN)  IV Stopped (  04/30/23 2110)   dexmedetomidine (PRECEDEX) IV infusion 1 mcg/kg/hr (05/01/23 0700)   heparin 2,200 Units/hr (05/01/23 0700)    Scheduled Medications:  amiodarone  400 mg Per Tube BID   budesonide (PULMICORT) nebulizer solution  0.5 mg Nebulization BID   Chlorhexidine Gluconate Cloth  6 each Topical Daily   dexamethasone  6 mg Per Tube Daily   gabapentin  100 mg Per Tube Q12H   levalbuterol  0.63 mg Nebulization Q6H   mouth rinse  15 mL Mouth Rinse Q2H   pantoprazole  40 mg Oral BID   rocuronium  100 mg Intravenous Once   rOPINIRole  1 mg Per Tube QHS   sodium bicarbonate  650 mg Per Tube BID   sodium chloride flush  10-40 mL Intracatheter Q12H    have reviewed scheduled and prn medications.  Physical Exam: General: intubated/sedated  Heart: irreg Lungs: CBS bilat  Abdomen: soft, non tender Extremities: no edema    05/01/2023,7:51 AM  LOS: 3 days

## 2023-05-01 NOTE — Progress Notes (Signed)
NAME:  Bradley Morgan, MRN:  [id number], DOB:  [birth year], LOS: 3 ADMISSION DATE:  04/27/2023, CONSULTATION DATE:  04/28/23 REFERRING MD:  Olevia Bowens, TRH, CHIEF COMPLAINT:  Respiratory distress   History of Present Illness:  Bradley Morgan is a 72 yo male with a PMH of CHF (EF 45%), HTN, HLD, CKD3a, and chronic LBBB who presented for progressive AMS, lethargy, and SOB over the last 7 days. Presented to ED and found to be afebrile, tachycardic to 140 with evidence of new-onset afib with RVR, tachypnea to 25, and hypoxemia to 90-93% O2 requiring 3L. WBC 22.6, Cr 6.6, lactic acid 3.2. Patient is COVID+. VBG with normal pH, low CO2, low bicarb. CXR with large consolidation in RLL. CT head normal. He was admitted to progressive on IV azithro and CTX for PNA. Outside window for remdesevir. Rates more reassuring at that time so no rate control given. Once on the floor, rapid was called for worsening respiratory distress now requiring 6L. He remained in afib with RVR. PCCM was ultimately consulted for further recommendations.  Pertinent  Medical History   Past Medical History:  Diagnosis Date   Adrenal adenoma, left    Arthritis    Bursitis of elbow    Complication of anesthesia    ANESTHESIA DID NOT TAKE EFFECT WITH BACK SURGERY "I FELT THEM CUT"   Congenital absence of right kidney    Coronary artery disease    Diverticulosis of colon    Family history of anesthesia complication    "hard time waking up my dad" many yrs ago   Frequency of urination    Full dentures    GAD (generalized anxiety disorder)    GERD (gastroesophageal reflux disease)    History of acute renal failure    12-16-2013;  09-24-2017 due to stone obstruction   History of duodenal ulcer 04/2000   w/ gi bleed post egd w/ cauterization (caused by chronic nsaid use)   History of encephalopathy 04/26/2014   ACUTE EPISODE SECONDARY TO DRUG OVERDOSE (NARC, BZD, FLEXERIL)   History of GI bleed    08/ 2001   duodenal ulcer bleed post  egd w/ cauterization   History of kidney stones    History of prostatitis    Hyperlipidemia    Hypertension    Left bundle branch block 05/05/2021   Left ureteral stone    Nocturia more than twice per night    Peripheral neuropathy    bilateral leg numbness due to back issues, walks w/ cane   Pneumonia    PONV (postoperative nausea and vomiting)    Restless leg syndrome    Solitary left kidney    Wears glasses    Significant Hospital Events: Including procedures, antibiotic start and stop dates in addition to other pertinent events   8/8 - admitted with PNA on IV abx, resp distress worsened requiring 3>6L O2 necessitating PCCM consult 04/29/2023 transferred to intensive care unit 8/10 Intubated for respiratory failure  Interim History / Subjective:  On minimal vent settings  Objective   Blood pressure 106/78, pulse 83, temperature 99.5 F (37.5 C), temperature source Axillary, resp. rate (!) 23, height 6\' 1"  (1.854 m), weight 98.1 kg, SpO2 93%.    Vent Mode: PRVC FiO2 (%):  [40 %-100 %] 40 % Set Rate:  [18 bmp] 18 bmp Vt Set:  [630 mL] 630 mL PEEP:  [5 cmH20] 5 cmH20 Plateau Pressure:  [18 cmH20] 18 cmH20   Intake/Output Summary (Last 24 hours) at 05/01/2023 1004 Last  data filed at 05/01/2023 0931 Gross per 24 hour  Intake 1316.69 ml  Output 800 ml  Net 516.69 ml   Filed Weights   04/29/23 0429 04/29/23 1100 05/01/23 0320  Weight: 93.1 kg 92.4 kg 98.1 kg   Physical Exam: General: Elderly, chronically ill-appearing, intermittently agitated HENT: [place], AT, ETT in place Eyes: EOMI, no scleral icterus Respiratory: Diminished but clear to auscultation bilaterally.  No crackles, wheezing or rales Cardiovascular: Irregular rate and rhythm, -M/R/G, no JVD GI: BS+, soft, nontender Extremities:-Edema,-tenderness Neuro: Eyes closed, intermittently agitated, moves extremities x 4  CXR 8/10 ETT in place, bibasilar opacities ABG adequate Na 40 CO2 18 BUN/Cr 99/4.52  improving LFTs normalized  Assessment & Plan:  Sepsis due to streptococcal pneumonia Acute hypoxemic respiratory failure  Acute metabolic encephalopathy - improved COVID-19 Former smoker -Full vent support -LTVV, 4-8cc/kg IBW with goal Pplat<30 and DP<15 -SBT/WUA daily -Ceftriaxone -Decadron -Pulmonary toilet -Scheduled  Pulmicort BID  and xopenex nebs  Paroxysmal atrial fibrillation with RVR - improved control Chronic LBBB Chronic systolic heart failure (EF 44% 2022> 25-30%) HTN - hold home due soft pressures -Tele -Cardiology following -Coreg and amio gtt -IV heparin  AoCKD3a with metabolic acidosis - improving Solitary kidney -No acute dialysis -Bolus followed by LR @75  cc/hr -Encouraged PO intake -Sodium bicarb tabs BID -Trend -Monitor UOP/Cr  Hyponatremia - resolved -Trend -IVF fluid resuscitation as above  Anxiety IV sedation per PAD protocol  ?Chronic pain -Gabapentin 100 mg BID -Holding home flexiril -PRN norco  Critical care time: 38 min    Diet: Tube feeds DVT: systemic heparin  GI: Home protonix BID  The patient is critically ill with multiple organ systems failure and requires high complexity decision making for assessment and support, frequent evaluation and titration of therapies, application of advanced monitoring technologies and extensive interpretation of multiple databases.  Independent Critical Care Time: 38 Minutes.   Mechele Collin, M.D. [facility] 05/01/2023 10:04 AM   Please see Amion for pager number to reach on-call Pulmonary and Critical Care Team.

## 2023-05-01 NOTE — Progress Notes (Signed)
Brief Nutrition Note  Consult received for enteral/tube feeding initiation and management.  Adult Enteral Nutrition Protocol initiated. Full assessment to follow.  OG tube in place with tip located in stomach per xray imaging.   Admitting Dx: Disorientation [R41.0] Acute respiratory failure with hypoxia (HCC) [J96.01] Sepsis due to pneumonia (HCC) [J18.9, A41.9] Community acquired pneumonia of right lower lobe of lung [J18.9]  Body mass index is 28.53 kg/m. Pt meets criteria for Overweight based on current BMI.  Labs:  Recent Labs  Lab 04/28/23 2117 04/29/23 1610 04/30/23 0027 04/30/23 1947 05/01/23 0437 05/01/23 0453 05/01/23 1149  NA  --  134* 131* 137 138 140  --   K  --  4.2 3.8 4.1 4.6 4.5  --   CL  --  99 97*  --   --  103  --   CO2  --  14* 18*  --   --  18*  --   BUN  --  100* 105*  --   --  99*  --   CREATININE  --  6.21* 5.70*  --   --  4.52*  --   CALCIUM  --  8.1* 7.8*  --   --  8.3*  --   MG 2.1  --  2.1  --   --   --  2.0  PHOS 6.7*  --   --   --   --   --  5.5*  GLUCOSE  --  112* 182*  --   --  112*  --      Hodges Bing, RD, LDN, CNSC.

## 2023-05-02 ENCOUNTER — Inpatient Hospital Stay (HOSPITAL_COMMUNITY): Payer: Medicare HMO

## 2023-05-02 DIAGNOSIS — I5022 Chronic systolic (congestive) heart failure: Secondary | ICD-10-CM | POA: Diagnosis not present

## 2023-05-02 DIAGNOSIS — N179 Acute kidney failure, unspecified: Secondary | ICD-10-CM | POA: Diagnosis not present

## 2023-05-02 DIAGNOSIS — J9601 Acute respiratory failure with hypoxia: Secondary | ICD-10-CM | POA: Diagnosis not present

## 2023-05-02 DIAGNOSIS — J189 Pneumonia, unspecified organism: Secondary | ICD-10-CM | POA: Diagnosis not present

## 2023-05-02 DIAGNOSIS — E43 Unspecified severe protein-calorie malnutrition: Secondary | ICD-10-CM | POA: Insufficient documentation

## 2023-05-02 LAB — CULTURE, RESPIRATORY W GRAM STAIN

## 2023-05-02 LAB — GLUCOSE, CAPILLARY
Glucose-Capillary: 119 mg/dL — ABNORMAL HIGH (ref 70–99)
Glucose-Capillary: 128 mg/dL — ABNORMAL HIGH (ref 70–99)
Glucose-Capillary: 120 mg/dL — ABNORMAL HIGH (ref 70–99)
Glucose-Capillary: 148 mg/dL — ABNORMAL HIGH (ref 70–99)
Glucose-Capillary: 126 mg/dL — ABNORMAL HIGH (ref 70–99)

## 2023-05-02 LAB — HEPARIN LEVEL (UNFRACTIONATED): Heparin Unfractionated: 0.67 IU/mL (ref 0.30–0.70)

## 2023-05-02 MED ORDER — CARVEDILOL 6.25 MG PO TABS
6.2500 mg | ORAL_TABLET | Freq: Two times a day (BID) | ORAL | Status: DC
  Administered 2023-05-02 (×2): 6.25 mg
  Filled 2023-05-02 (×2): qty 1

## 2023-05-02 MED ORDER — ACETYLCYSTEINE 20 % IN SOLN
4.0000 mL | Freq: Four times a day (QID) | RESPIRATORY_TRACT | Status: AC
  Administered 2023-05-02 – 2023-05-05 (×12): 4 mL via RESPIRATORY_TRACT
  Filled 2023-05-02 (×12): qty 4

## 2023-05-02 MED ORDER — ROCURONIUM BROMIDE 10 MG/ML (PF) SYRINGE
PREFILLED_SYRINGE | INTRAVENOUS | Status: AC
  Administered 2023-05-02: 100 mg via INTRAVENOUS
  Filled 2023-05-02: qty 10

## 2023-05-02 MED ORDER — MAGNESIUM SULFATE 4 GM/100ML IV SOLN
4.0000 g | Freq: Once | INTRAVENOUS | Status: AC
  Administered 2023-05-02: 4 g via INTRAVENOUS
  Filled 2023-05-02: qty 100

## 2023-05-02 MED ORDER — LEVALBUTEROL HCL 1.25 MG/0.5ML IN NEBU
1.2500 mg | INHALATION_SOLUTION | Freq: Four times a day (QID) | RESPIRATORY_TRACT | Status: DC | PRN
  Administered 2023-05-03 – 2023-05-04 (×3): 1.25 mg via RESPIRATORY_TRACT
  Filled 2023-05-02 (×3): qty 0.5

## 2023-05-02 MED ORDER — FENTANYL BOLUS VIA INFUSION
25.0000 ug | INTRAVENOUS | Status: DC | PRN
  Administered 2023-05-02 (×2): 100 ug via INTRAVENOUS

## 2023-05-02 MED ORDER — FENTANYL CITRATE PF 50 MCG/ML IJ SOSY: 25.0000 ug | PREFILLED_SYRINGE | Freq: Once | INTRAMUSCULAR | Status: DC

## 2023-05-02 MED ORDER — ROCURONIUM BROMIDE 50 MG/5ML IV SOLN: 100.0000 mg | Freq: Once | INTRAVENOUS | Status: AC

## 2023-05-02 MED ORDER — AMIODARONE HCL 200 MG PO TABS
200.0000 mg | ORAL_TABLET | Freq: Two times a day (BID) | ORAL | Status: DC
  Administered 2023-05-02 – 2023-05-05 (×7): 200 mg
  Filled 2023-05-02 (×7): qty 1

## 2023-05-02 MED ORDER — ETOMIDATE 2 MG/ML IV SOLN: 20.0000 mg | Freq: Once | INTRAVENOUS | Status: AC

## 2023-05-02 MED ORDER — AMIODARONE HCL 200 MG PO TABS: 200.0000 mg | ORAL_TABLET | Freq: Two times a day (BID) | ORAL | Status: DC

## 2023-05-02 MED ORDER — FENTANYL 2500MCG IN NS 250ML (10MCG/ML) PREMIX INFUSION
25.0000 ug/h | INTRAVENOUS | Status: DC
  Administered 2023-05-02: 25 ug/h via INTRAVENOUS
  Administered 2023-05-03: 200 ug/h via INTRAVENOUS
  Administered 2023-05-03: 125 ug/h via INTRAVENOUS
  Administered 2023-05-04: 150 ug/h via INTRAVENOUS
  Administered 2023-05-04: 200 ug/h via INTRAVENOUS
  Administered 2023-05-05 (×2): 150 ug/h via INTRAVENOUS
  Administered 2023-05-06: 100 ug/h via INTRAVENOUS
  Filled 2023-05-02 (×8): qty 250

## 2023-05-02 MED ORDER — VITAL 1.5 CAL PO LIQD
1000.0000 mL | ORAL | Status: DC
  Administered 2023-05-02 – 2023-05-06 (×3): 1000 mL

## 2023-05-02 MED ORDER — ETOMIDATE 2 MG/ML IV SOLN
INTRAVENOUS | Status: AC
  Administered 2023-05-02: 20 mg via INTRAVENOUS
  Filled 2023-05-02: qty 10

## 2023-05-02 NOTE — Progress Notes (Signed)
Pt too agitated for CPT at this time after many attempts. Discussed with RN that CPT will be completed at later time.

## 2023-05-02 NOTE — IPAL (Signed)
  Interdisciplinary Goals of Care Family Meeting   Date carried out: 05/02/2023  Location of the meeting: Bedside  Member's involved: Physician, Bedside Registered Nurse, and Family Member or next of kin  Durable Power of Attorney or acting medical decision maker: Bradley Morgan  Discussion: We discussed goals of care for Bradley Morgan .   Patient condition was updated to patient's wife at bedside and patient's daughter over the phone, explained that he is generalized weak, unable to come off of ventilator, will try in the next few days to see if he continued to improve and he can come off of ventilator, if he came to then I gave 2 options including proceeding with tracheostomy versus comfort focused approach. Patient's family stated that he would not want tracheostomy or nursing home placement, he has been suffering with chronic problems, if he does not improve they would like him to be comfortable and let him pass peacefully  They would like to keep patient DNR and continue current care for next few days before patient's daughter arrive from [place] and finalize the decision  Code status:   Code Status: DNR   Disposition: Continue current acute care     Bradley Fowler, MD [facility] See Amion for pager If no response to pager, please call [phone] until 7pm After 7pm, Please call E-link [phone]

## 2023-05-02 NOTE — TOC CM/SW Note (Addendum)
Transition of Care [facility]) - Inpatient Brief Assessment   Patient Details  Name: Bradley Morgan MRN: [id number] Date of Birth: 1951-04-17  Transition of Care [facility]) CM/SW Contact:    Tom-Johnson, Hershal Coria, RN Phone Number: 05/02/2023, 2:22 PM   Clinical Narrative:  Patient presented to the ED with progressive Weakness, Fatigue, SOB, Altered Mental Status, Fever, Decreased Appetite and  Non-productive Cough.  Found to have CAP and Covid Positive.  Patient was placed on BIPAP on admission.  Intubated on 04/30/23 for worsening Respiratory Function, currently intubated and sedated. On IV abx and Heparin gtt.  Nephrology and Cardiology following.   No TOC needs or recommendations noted at this time.  Patient not Medically ready for discharge.  CM will continue to follow as patient progresses with care towards discharge.     Transition of Care Asessment: Insurance and Status: Insurance coverage has been reviewed Patient has primary care physician: Yes Home environment has been reviewed: Yes Prior level of function:: UTA, patient Intubated Prior/Current Home Services:  (UTA, patient Intubated) Social Determinants of Health Reivew:  (UTA, patient Intubated) Readmission risk has been reviewed: Yes Transition of care needs: transition of care needs identified, TOC will continue to follow

## 2023-05-02 NOTE — Progress Notes (Signed)
ANTICOAGULATION CONSULT NOTE   Pharmacy Consult for Heparin  Indication: atrial fibrillation  Allergies  Allergen Reactions   Aspirin Other (See Comments)    Stomach ulcers. No high doses   Nsaids Other (See Comments)    CAUSES STOMACH ULCERS     Patient Measurements: Height: 6\' 1"  (185.4 cm) Weight: 91.8 kg (202 lb 6.1 oz) IBW/kg (Calculated) : 79.9 Heparin dosing weight: 92.4 kg  Vital Signs: Temp: 99.8 F (37.7 C) (08/12 0200) Temp Source: Axillary (08/12 0200) BP: 110/76 (08/12 0600) Pulse Rate: 79 (08/12 0600)  Labs: Recent Labs    04/30/23 0027 04/30/23 0647 04/30/23 1947 05/01/23 0437 05/01/23 0453 05/02/23 0319  HGB 12.5*  --    < > 11.6* 11.8* 11.6*  HCT 36.6*  --    < > 34.0* 35.3* 34.7*  PLT 259  --   --   --  274 258  HEPARINUNFRC 0.42 0.50  --   --  0.59 0.73*  CREATININE 5.70*  --   --   --  4.52* 3.60*   < > = values in this interval not displayed.    Estimated Creatinine Clearance: 21.6 mL/min (A) (by C-G formula based on SCr of 3.6 mg/dL (H)).   Medical History: Past Medical History:  Diagnosis Date   Adrenal adenoma, left    Arthritis    Bursitis of elbow    Complication of anesthesia    ANESTHESIA DID NOT TAKE EFFECT WITH BACK SURGERY "I FELT THEM CUT"   Congenital absence of right kidney    Coronary artery disease    Diverticulosis of colon    Family history of anesthesia complication    "hard time waking up my dad" many yrs ago   Frequency of urination    Full dentures    GAD (generalized anxiety disorder)    GERD (gastroesophageal reflux disease)    History of acute renal failure    12-16-2013;  09-24-2017 due to stone obstruction   History of duodenal ulcer 04/2000   w/ gi bleed post egd w/ cauterization (caused by chronic nsaid use)   History of encephalopathy 04/26/2014   ACUTE EPISODE SECONDARY TO DRUG OVERDOSE (NARC, BZD, FLEXERIL)   History of GI bleed    08/ 2001   duodenal ulcer bleed post egd w/ cauterization    History of kidney stones    History of prostatitis    Hyperlipidemia    Hypertension    Left bundle branch block 05/05/2021   Left ureteral stone    Nocturia more than twice per night    Peripheral neuropathy    bilateral leg numbness due to back issues, walks w/ cane   Pneumonia    PONV (postoperative nausea and vomiting)    Restless leg syndrome    Solitary left kidney    Wears glasses     Assessment: 72 y/o M with COVID-19, acute on chronic kidney failure, and new onset afib. Pharmacy consulted to manage heparin infusion.   CBC Hgb 11.6, Plt 258 - low stable HL 0.73 - supratherapeutic  Goal of Therapy:  Heparin level 0.3-0.7 units/ml Monitor platelets by anticoagulation protocol: Yes   Plan:  Decrease to heparin 2100 units/hr Heparin level in 8 hours at 1600 Daily HL, CBC F/u transition to enteral anticoagulation as appropriate  Calton Dach, PharmD Clinical Pharmacist 05/02/2023 7:19 AM

## 2023-05-02 NOTE — Progress Notes (Addendum)
NAME:  Bradley Morgan, MRN:  [id number], DOB:  Apr 26, 1951, LOS: 4 ADMISSION DATE:  04/27/2023, CONSULTATION DATE:  04/28/23 REFERRING MD:  Olevia Bowens, TRH, CHIEF COMPLAINT:  Respiratory distress   History of Present Illness:  Mr. Witteman is a 72 yo male with a PMH of CHF (EF 45%), HTN, HLD, CKD3a, and chronic LBBB who presented for progressive AMS, lethargy, and SOB over the last 7 days. Presented to ED and found to be afebrile, tachycardic to 140 with evidence of new-onset afib with RVR, tachypnea to 25, and hypoxemia to 90-93% O2 requiring 3L. WBC 22.6, Cr 6.6, lactic acid 3.2. Patient is COVID+. VBG with normal pH, low CO2, low bicarb. CXR with large consolidation in RLL. CT head normal. He was admitted to progressive on IV azithro and CTX for PNA. Outside window for remdesevir. Rates more reassuring at that time so no rate control given. Once on the floor, rapid was called for worsening respiratory distress now requiring 6L. He remained in afib with RVR. PCCM was ultimately consulted for further recommendations.  Pertinent  Medical History   Past Medical History:  Diagnosis Date   Adrenal adenoma, left    Arthritis    Bursitis of elbow    Complication of anesthesia    ANESTHESIA DID NOT TAKE EFFECT WITH BACK SURGERY "I FELT THEM CUT"   Congenital absence of right kidney    Coronary artery disease    Diverticulosis of colon    Family history of anesthesia complication    "hard time waking up my dad" many yrs ago   Frequency of urination    Full dentures    GAD (generalized anxiety disorder)    GERD (gastroesophageal reflux disease)    History of acute renal failure    12-16-2013;  09-24-2017 due to stone obstruction   History of duodenal ulcer 04/2000   w/ gi bleed post egd w/ cauterization (caused by chronic nsaid use)   History of encephalopathy 04/26/2014   ACUTE EPISODE SECONDARY TO DRUG OVERDOSE (NARC, BZD, FLEXERIL)   History of GI bleed    08/ 2001   duodenal ulcer bleed post  egd w/ cauterization   History of kidney stones    History of prostatitis    Hyperlipidemia    Hypertension    Left bundle branch block 05/05/2021   Left ureteral stone    Nocturia more than twice per night    Peripheral neuropathy    bilateral leg numbness due to back issues, walks w/ cane   Pneumonia    PONV (postoperative nausea and vomiting)    Restless leg syndrome    Solitary left kidney    Wears glasses    Significant Hospital Events: Including procedures, antibiotic start and stop dates in addition to other pertinent events   8/8 - admitted with PNA on IV abx, resp distress worsened requiring 3>6L O2 necessitating PCCM consult 04/29/2023 transferred to intensive care unit 8/10 Intubated for respiratory failure  Interim History / Subjective:  Continues to have thick respiratory secretions Did not tolerate spontaneous breathing trial this morning Spiked fever with Tmax 100.5  Objective   Blood pressure 100/70, pulse 67, temperature (!) 100.5 F (38.1 C), temperature source Axillary, resp. rate (!) 30, height 6\' 1"  (1.854 m), weight 91.8 kg, SpO2 93%.    Vent Mode: PSV;CPAP FiO2 (%):  [40 %] 40 % Set Rate:  [18 bmp] 18 bmp Vt Set:  [630 mL] 630 mL PEEP:  [5 cmH20] 5 cmH20 Pressure Support:  [  8 cmH20] 8 cmH20   Intake/Output Summary (Last 24 hours) at 05/02/2023 6160 Last data filed at 05/02/2023 0800 Gross per 24 hour  Intake 4647.49 ml  Output 1400 ml  Net 3247.49 ml   Filed Weights   04/29/23 1100 05/01/23 0320 05/02/23 0500  Weight: 92.4 kg 98.1 kg 91.8 kg   Physical Exam: General: Crtitically ill-appearing elderly male, orally intubated HEENT: [place]/AT, eyes anicteric.  ETT and OGT in place Neuro: Sedated, not following commands.  Eyes are closed.  Pupils 3 mm bilateral reactive to light Chest: Bilateral coarse crackles all over, no wheezes or rhonchi Heart: Irregular irregular, no murmurs or gallops Abdomen: Soft, nondistended, bowel sounds present Skin: No  rash  Labs and images were reviewed  Resolved Hospital problem:  Hyponatremia Hyperphosphatemia  Assessment & Plan:  Sepsis due to streptococcal pneumonia, POA Acute hypoxemic respiratory failure  Acute septic encephalopathy COVID-19 pneumonia Continue full support mechanical ventilation VAP prevention bundle in place PAD protocol with Precedex and fentanyl infusion Repeat respiratory culture Continue IV antibiotics Sepsis and Lasix have improved Continue IV Decadron for 10 days therapy Continue Pulmicort and Xopenex nebs  Paroxysmal atrial fibrillation with RVR Chronic LBBB Chronic systolic heart failure (EF 44% 2022> 25-30%) HTN Remain in A-fib with controlled rate Continue amiodarone Restarted back on low-dose Coreg Appreciate cardiology input Continue IV heparin infusion for stroke prophylaxis  AKI on CKD3a HAGMA Solitary kidney Continue gentle IV fluid hydration Serum creatinine started trending down, down from 4.5-3.6 Serum bicarbonate has improved to 21 Continue sodium bicarbonate Avoid nephrotoxic agent  Chronic pain Continue gabapentin 100 mg BID Holding home flexiril Continue PRN norco  Severe protein calorie malnutrition Continue dietary supplements   The patient is critically ill due to acute respiratory failure with hypoxia, pneumococcal pneumonia, COVID-19 p Pneumonia. Critical care was necessary to treat or prevent imminent or life-threatening deterioration.  Critical care was time spent personally by me on the following activities: development of treatment plan with patient and/or surrogate as well as nursing, discussions with consultants, evaluation of patient's response to treatment, examination of patient, obtaining history from patient or surrogate, ordering and performing treatments and interventions, ordering and review of laboratory studies, ordering and review of radiographic studies, pulse oximetry, re-evaluation of patient's condition and  participation in multidisciplinary rounds.   During this encounter critical care time was devoted to patient care services described in this note for 39 minutes.     Cheri Fowler, MD [facility] See Amion for pager If no response to pager, please call [phone] until 7pm After 7pm, Please call E-link [phone]

## 2023-05-02 NOTE — Progress Notes (Signed)
Initial Nutrition Assessment  DOCUMENTATION CODES:   Severe malnutrition in context of chronic illness  INTERVENTION:   - Plan to exchange OG tube for Cortrak tube today  Continue tube feeds via Cortrak once placement confirmed by x-ray: - Change to Vital 1.5 @ 35 ml/hr. Titrate by 10 ml every 8 hours to goal rate of 65 ml/hr (1560 ml/day). - PROSource TF20 60 ml daily  Tube feeding regimen at goal rate provides 2420 kcal, 125 grams of protein, and 1192 ml of H2O.  Monitor magnesium, potassium, and phosphorus every 12 hours for at least 6 occurrences. MD to replete as needed as pt is at risk for refeeding syndrome given severe malnutrition.  NUTRITION DIAGNOSIS:   Severe Malnutrition related to chronic illness (CHF, CKD) as evidenced by severe fat depletion, severe muscle depletion.  GOAL:   Patient will meet greater than or equal to 90% of their needs  MONITOR:   Vent status, Labs, Weight trends, TF tolerance  REASON FOR ASSESSMENT:   Ventilator, Consult Enteral/tube feeding initiation and management  ASSESSMENT:   72 year old male who presented to the ED on 8/07 with AMS. 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 Pt tested positive for COVID-19 on admission. Pt admitted with sepsis due to pneumonia, COVID-19 infection, AKI on CKD stage IIIa.  8/08 - dysphagia 3 with thin liquids 8/10 - intubated  Discussed pt with RN and during ICU rounds. Consult received for enteral nutrition initiation and management. Pt with OG tube in stomach per abdominal x-ray. Tube feeding protocol started yesterday. Plan to exchange OG tube for Cortrak.  Nephrology following. Per notes, UOP increasing and renal functioning slowly improving.  When pt previously on a dysphagia 3 diet, meal completions were 50-70% for 2 documented meals.  Reviewed weight history in chart. Pt with a 2.8 kg weight loss since 02/10/23. This is a 3% weight loss in  less than 3 months which is not clinically significant but is concerning given pt meets criteria for moderate malnutrition based on NFPE. Will start tube feeds at lower rate and slowly increase to goal. Pt is at risk for refeeding syndrome.  Admit weight: 89.6 kg Current weight: 91.8 kg  Current TF: Vital AF 1.2 @ 30 ml/hr, PROSource TF20 60 ml daily  Patient is currently intubated on ventilator support MV: 19 L/min Temp (24hrs), Avg:99.5 F (37.5 C), Min:98.7 F (37.1 C), Max:100.5 F (38.1 C)  Drips: Precedex Fentanyl Heparin LR: 75 ml/hr  Medications reviewed and include: decadron, IV protonix, sodium bicarbonate 650 mg BID, IV abx, IV magnesium sulfate 4 grams x 1  Labs reviewed: BUN 95, creatinine 3.90, WBC 11.8, magnesium 1.9 CBG's: 116-136 x 24 hours  UOP: 1600 ml x 24 hours I/O's: +8.9 L since admit  NUTRITION - FOCUSED PHYSICAL EXAM:  Flowsheet Row Most Recent Value  Orbital Region Severe depletion  Upper Arm Region Moderate depletion  Thoracic and Lumbar Region Severe depletion  Buccal Region Unable to assess  Temple Region Severe depletion  Clavicle Bone Region Severe depletion  Clavicle and Acromion Bone Region Severe depletion  Scapular Bone Region Moderate depletion  Dorsal Hand Unable to assess  Patellar Region Moderate depletion  Anterior Thigh Region Moderate depletion  Posterior Calf Region Mild depletion  Edema (RD Assessment) Mild  Hair Reviewed  Eyes Reviewed  Mouth Reviewed  Skin Reviewed  Nails Reviewed    Diet Order:   Diet Order     None  EDUCATION NEEDS:   Not appropriate for education at this time  Skin:  Skin Assessment: Reviewed RN Assessment  Last BM:  04/30/23  Height:   Ht Readings from Last 1 Encounters:  04/30/23 6\' 1"  (1.854 m)    Weight:   Wt Readings from Last 1 Encounters:  05/02/23 91.8 kg    Ideal Body Weight:  83.6 kg  BMI:  Body mass index is 26.7 kg/m.  Estimated Nutritional Needs:    Kcal:  2300-2500  Protein:  115-130 grams  Fluid:  >2.0 L    Mertie Clause, MS, RD, LDN Registered Dietitian II Please see AMiON for contact information.

## 2023-05-02 NOTE — Progress Notes (Signed)
ANTICOAGULATION CONSULT NOTE   Pharmacy Consult for Heparin  Indication: atrial fibrillation  Allergies  Allergen Reactions   Aspirin Other (See Comments)    Stomach ulcers. No high doses   Nsaids Other (See Comments)    CAUSES STOMACH ULCERS     Patient Measurements: Height: 6\' 1"  (185.4 cm) Weight: 91.8 kg (202 lb 6.1 oz) IBW/kg (Calculated) : 79.9 Heparin dosing weight: 92.4 kg  Vital Signs: Temp: 100.7 F (38.2 C) (08/12 1618) Temp Source: Axillary (08/12 1618) BP: 100/70 (08/12 1530) Pulse Rate: 68 (08/12 1530)  Labs: Recent Labs    04/30/23 0027 04/30/23 0647 05/01/23 0437 05/01/23 0453 05/02/23 0319 05/02/23 1651  HGB 12.5*   < > 11.6* 11.8* 11.6*  --   HCT 36.6*   < > 34.0* 35.3* 34.7*  --   PLT 259  --   --  274 258  --   HEPARINUNFRC 0.42   < >  --  0.59 0.73* 0.67  CREATININE 5.70*  --   --  4.52* 3.60*  --    < > = values in this interval not displayed.    Estimated Creatinine Clearance: 21.6 mL/min (A) (by C-G formula based on SCr of 3.6 mg/dL (H)).   Assessment: 72 y/o M with COVID-19, acute on chronic kidney failure, and new onset afib. Pharmacy consulted to manage heparin infusion.   Heparin level therapeutic (0.67) on infusion at 2100 units/hr. No bleeding noted.  Goal of Therapy:  Heparin level 0.3-0.7 units/ml Monitor platelets by anticoagulation protocol: Yes   Plan:  Continue heparin infusion at 2100 units/hr F/u a.m. heparin level  Christoper Fabian, PharmD, BCPS Please see amion for complete clinical pharmacist phone list 05/02/2023 5:46 PM

## 2023-05-02 NOTE — Progress Notes (Signed)
Sputum collected and taken down to lab at this time

## 2023-05-02 NOTE — Progress Notes (Signed)
72 y.o. yo male with ischemic cardiomyopathy, A fib, solitary kidney with baseline stage 3 CKD  who was admitted on 2023-05-24 with confusion-  diagnosed with bacterial PNA and covid-  also found to have worsening EF and Afib with RVR   1. Renal-  crt 1.6 in May of 23, 1.22 in April of 24.  Now with  A on CRF in the setting of PNA, Afib with RVR-  imaging does not show hydro-  urine with 100 of protein- no RBC, min WBC.  Renal function trending worse since admit but non oliguric.  Suspect mostly hemodynamic given the scenario but also an element of rhabdo-  was on ARB as OP-  has been held. Thought he had a high risk of progressing to needing dialysis but has held his own so far.  The last 24 hours 1600 mL UOP and renal function slowly improving. no acute dialysis needs today and hopefully improving trend improves as well as increased UOP.  Will continue to follow to for now.  2. Metabolic acidosis- elevated lactate on maintenance LR 13ml/hr to support BP and acidosis-  also added oral bicarb-  moving in right direction 3. Anemia-  surprisingly good-  indicates was likely dry   4. HTN/volume- seems euvolemic-  not overloaded   5 ID-  given tx for  covid and PNA-  WBC improving   6. Resp failure-  intubated on 8/10  Subjective:  ended up getting intubated-   not a big surprise. Very agitated requiring sedation  Objective Vital signs in last 24 hours: Vitals:   05/02/23 0530 05/02/23 0600 05/02/23 0700 05/02/23 0741  BP: 101/64 110/76 100/70   Pulse: 74 79 73 67  Resp: (!) 30 18 18  (!) 30  Temp:    (!) 100.5 F (38.1 C)  TempSrc:    Axillary  SpO2: 91% 93% 92% 93%  Weight:      Height:       Weight change: -6.3 kg  Intake/Output Summary (Last 24 hours) at 05/02/2023 1002 Last data filed at 05/02/2023 0800 Gross per 24 hour  Intake 4647.49 ml  Output 1400 ml  Net 3247.49 ml   Tremaine Earwood W    Labs: Basic Metabolic Panel: Recent Labs  Lab 04/30/23 0027 04/30/23 1947  05/01/23 0437 05/01/23 0453 05/01/23 1149 05/01/23 1719 05/02/23 0319  NA 131*   < > 138 140  --   --  144  K 3.8   < > 4.6 4.5  --   --  4.5  CL 97*  --   --  103  --   --  106  CO2 18*  --   --  18*  --   --  21*  GLUCOSE 182*  --   --  112*  --   --  134*  BUN 105*  --   --  99*  --   --  95*  CREATININE 5.70*  --   --  4.52*  --   --  3.60*  CALCIUM 7.8*  --   --  8.3*  --   --  8.8*  PHOS  --   --   --   --  5.5* 5.4* 4.2   < > = values in this interval not displayed.   Liver Function Tests: Recent Labs  Lab 04/29/23 0638 04/30/23 0027 05/01/23 0453  AST 61* 51* 28  ALT 33 32 27  ALKPHOS 76 78 70  BILITOT 0.6 0.6 0.5  PROT 5.8* 5.6* 5.2*  ALBUMIN 2.3* 2.4* 2.1*   No results for input(s): "LIPASE", "AMYLASE" in the last 168 hours. No results for input(s): "AMMONIA" in the last 168 hours. CBC: Recent Labs  Lab 05/11/2023 0020 04/28/23 0031 04/28/23 0454 04/29/23 3244 04/30/23 0027 04/30/23 1947 05/01/23 0437 05/01/23 0453 05/02/23 0319  WBC 22.6*  --  18.9* 22.0* 17.2*  --   --  11.6* 11.8*  NEUTROABS 20.2*  --  17.4*  --   --   --   --   --   --   HGB 14.0   < > 12.4* 13.5 12.5*   < > 11.6* 11.8* 11.6*  HCT 41.4   < > 37.6* 38.7* 36.6*   < > 34.0* 35.3* 34.7*  MCV 88.1  --  88.7 84.5 85.7  --   --  87.6 88.5  PLT 237  --  187 270 259  --   --  274 258   < > = values in this interval not displayed.   Cardiac Enzymes: Recent Labs  Lab 04/28/23 0454 04/29/23 0638  CKTOTAL 4,057* 2,671*   CBG: Recent Labs  Lab 04/30/23 2309 05/01/23 0326 05/01/23 1954 05/01/23 2335 05/02/23 0755  GLUCAP 116* 125* 124* 122* 119*    Iron Studies: No results for input(s): "IRON", "TIBC", "TRANSFERRIN", "FERRITIN" in the last 72 hours. Studies/Results: DG Abd Portable 1V  Result Date: 04/30/2023 CLINICAL DATA:  [id number].  Encounter for orogastric tube placement. EXAM: PORTABLE ABDOMEN - 1 VIEW COMPARISON:  Earlier study today at 7:23 p.m. FINDINGS: Single high  transverse abdomen at 9:33 p.m. NG tube enters the stomach coursing to the left with the tip abutting the wall of the proximal fundus. The bowel pattern is nonobstructive. There are patchy calcifications in the aorta and splenic artery. No nephrolithiasis is seen. There is no supine evidence of free air. There is advanced degenerative change and levoscoliosis of the lumbar spine. Consolidation in the left lower lobe is present and a small left pleural effusion. IMPRESSION: 1. NG tube enters the stomach coursing to the left with the tip abutting the wall of the proximal fundus. 2. Nonobstructive bowel gas pattern.  Incidental findings as above. Electronically Signed   By: Almira Bar M.D.   On: 04/30/2023 21:46   DG Abd Portable 1V  Result Date: 04/30/2023 CLINICAL DATA:  Check gastric catheter placement EXAM: PORTABLE ABDOMEN - 1 VIEW COMPARISON:  None Available. FINDINGS: Gastric catheter is noted in the distal esophagus. This should be advanced several cm deeper into the stomach. IMPRESSION: Gastric catheter in the distal esophagus as described. Electronically Signed   By: Alcide Clever M.D.   On: 04/30/2023 19:45   DG CHEST PORT 1 VIEW  Result Date: 04/30/2023 CLINICAL DATA:  Check gastric catheter placement EXAM: PORTABLE CHEST 1 VIEW COMPARISON:  04/29/2023 FINDINGS: Endotracheal tube is noted in satisfactory position. Left jugular central line is seen with the catheter tip at the junction of the innominate veins. No pneumothorax is noted. Gastric catheter lies in the distal esophagus and should be advanced several cm deeper into the stomach. Persistent airspace opacity is noted in the bases bilaterally somewhat improved on the right but somewhat worsened in the left retrocardiac region. No other focal abnormality is noted. IMPRESSION: Tubes and lines as described above. Gastric catheter should be advanced deeper into the stomach. Bibasilar airspace opacities somewhat improved on the right and  somewhat worsened on the left. Electronically Signed   By: Alcide Clever  M.D.   On: 04/30/2023 19:44   [place] EKG SITE RITE  Result Date: 04/30/2023 If Site Rite image not attached, placement could not be confirmed due to current cardiac rhythm.  Medications: Infusions:  cefTRIAXone (ROCEPHIN)  IV Stopped (05/01/23 1748)   dexmedetomidine (PRECEDEX) IV infusion 1.2 mcg/kg/hr (05/02/23 0929)   feeding supplement (VITAL AF 1.2 CAL) 30 mL/hr at 05/02/23 0800   heparin 2,100 Units/hr (05/02/23 0800)   lactated ringers 75 mL/hr at 05/02/23 0800   magnesium sulfate bolus IVPB      Scheduled Medications:  amiodarone  200 mg Per Tube BID   budesonide (PULMICORT) nebulizer solution  0.5 mg Nebulization BID   carvedilol  6.25 mg Per Tube BID   Chlorhexidine Gluconate Cloth  6 each Topical Daily   dexamethasone  6 mg Per Tube Daily   feeding supplement (PROSource TF20)  60 mL Per Tube Daily   gabapentin  100 mg Per Tube Q12H   levalbuterol  0.63 mg Nebulization BID   mouth rinse  15 mL Mouth Rinse Q2H   pantoprazole (PROTONIX) IV  40 mg Intravenous Q12H   rOPINIRole  1 mg Per Tube QHS   sodium bicarbonate  650 mg Per Tube BID   sodium chloride flush  10-40 mL Intracatheter Q12H    have reviewed scheduled and prn medications.  Physical Exam: General: intubated/sedated  Heart: irreg Lungs: CBS bilat  Abdomen: soft, non tender Extremities: tr edema    05/02/2023,10:02 AM  LOS: 4 days

## 2023-05-02 NOTE — Plan of Care (Signed)

## 2023-05-02 NOTE — Procedures (Signed)
Bronchoscopy Procedure Note  Bradley Morgan  [id number]  [birth year]  Date:05/02/23  Time:12:28 PM   Provider Performing:    Procedure(s):  Flexible bronchoscopy with bronchial alveolar lavage [phone]) and Initial Therapeutic Aspiration of Tracheobronchial Tree [phone])  Indication(s) Acute respiratory failure/high peak pressures due to mucous plugging  Consent Risks of the procedure as well as the alternatives and risks of each were explained to the patient and/or caregiver.  Consent for the procedure was obtained and is signed in the bedside chart  Anesthesia Etomidate and rocuronium   Time Out Verified patient identification, verified procedure, site/side was marked, verified correct patient position, special equipment/implants available, medications/allergies/relevant history reviewed, required imaging and test results available.   Sterile Technique Usual hand hygiene, masks, gowns, and gloves were used   Procedure Description Bronchoscope advanced through endotracheal tube and into airway.  Airways were examined down to subsegmental level with findings noted below.   Following diagnostic evaluation, BAL(s) performed in right lower lobe with normal saline and return of yellowish fluid and Therapeutic aspiration performed in throughout the respiratory tree especially in bilateral lower lobes  Findings: Copious amount of tenacious secretions noted throughout the respiratory tree, mucosa looked inflamed especially in the right and left lower lobe   Complications/Tolerance None; patient tolerated the procedure well. Chest X-ray is needed post procedure.   EBL Minimal   Specimen(s) BAL

## 2023-05-02 NOTE — Procedures (Signed)
Cortrak  Person Inserting Tube:  Alroy Dust, Makayla L, RD Tube Type:  Cortrak - 43 inches Tube Size:  10 Tube Location:  Left nare Secured by: Bridle Technique Used to Measure Tube Placement:  Marking at nare/corner of mouth Cortrak Secured At:  70 cm   Cortrak Tube Team Note:  Consult received to place a Cortrak feeding tube.   X-ray is required, abdominal x-ray has been ordered by the Cortrak team. Please confirm tube placement before using the Cortrak tube.   If the tube becomes dislodged please keep the tube and contact the Cortrak team at [email or web address] for replacement.  If after hours and replacement cannot be delayed, place a NG tube and confirm placement with an abdominal x-ray.    Hermina Barters RD, LDN Clinical Dietitian See Shea Evans for contact information.

## 2023-05-03 DIAGNOSIS — N179 Acute kidney failure, unspecified: Secondary | ICD-10-CM | POA: Diagnosis not present

## 2023-05-03 DIAGNOSIS — J189 Pneumonia, unspecified organism: Secondary | ICD-10-CM | POA: Diagnosis not present

## 2023-05-03 DIAGNOSIS — J9601 Acute respiratory failure with hypoxia: Secondary | ICD-10-CM | POA: Diagnosis not present

## 2023-05-03 LAB — HEPARIN LEVEL (UNFRACTIONATED): Heparin Unfractionated: 0.69 IU/mL (ref 0.30–0.70)

## 2023-05-03 LAB — GLUCOSE, CAPILLARY
Glucose-Capillary: 135 mg/dL — ABNORMAL HIGH (ref 70–99)
Glucose-Capillary: 141 mg/dL — ABNORMAL HIGH (ref 70–99)
Glucose-Capillary: 117 mg/dL — ABNORMAL HIGH (ref 70–99)
Glucose-Capillary: 145 mg/dL — ABNORMAL HIGH (ref 70–99)
Glucose-Capillary: 149 mg/dL — ABNORMAL HIGH (ref 70–99)

## 2023-05-03 MED ORDER — SODIUM CHLORIDE 0.9 % IV SOLN
3.0000 g | Freq: Two times a day (BID) | INTRAVENOUS | Status: DC
  Administered 2023-05-03 – 2023-05-04 (×3): 3 g via INTRAVENOUS
  Filled 2023-05-03 (×3): qty 8

## 2023-05-03 MED ORDER — PROPOFOL 1000 MG/100ML IV EMUL
0.0000 ug/kg/min | INTRAVENOUS | Status: DC
  Administered 2023-05-03 (×3): 30 ug/kg/min via INTRAVENOUS
  Administered 2023-05-04: 50 ug/kg/min via INTRAVENOUS
  Administered 2023-05-04 (×2): 35 ug/kg/min via INTRAVENOUS
  Administered 2023-05-04 (×3): 40 ug/kg/min via INTRAVENOUS
  Administered 2023-05-05: 45 ug/kg/min via INTRAVENOUS
  Administered 2023-05-05: 50 ug/kg/min via INTRAVENOUS
  Administered 2023-05-05: 40 ug/kg/min via INTRAVENOUS
  Administered 2023-05-05: 30 ug/kg/min via INTRAVENOUS
  Administered 2023-05-05: 40 ug/kg/min via INTRAVENOUS
  Administered 2023-05-06 (×2): 30 ug/kg/min via INTRAVENOUS
  Administered 2023-05-06: 15 ug/kg/min via INTRAVENOUS
  Filled 2023-05-03 (×17): qty 100

## 2023-05-03 MED ORDER — CARVEDILOL 3.125 MG PO TABS
3.1250 mg | ORAL_TABLET | Freq: Two times a day (BID) | ORAL | Status: DC
  Administered 2023-05-03 – 2023-05-06 (×7): 3.125 mg
  Filled 2023-05-03 (×7): qty 1

## 2023-05-03 NOTE — Progress Notes (Signed)
ANTICOAGULATION CONSULT NOTE   Pharmacy Consult for Heparin  Indication: atrial fibrillation  Allergies  Allergen Reactions   Aspirin Other (See Comments)    Stomach ulcers. No high doses   Nsaids Other (See Comments)    CAUSES STOMACH ULCERS     Patient Measurements: Height: 6\' 1"  (185.4 cm) Weight: 91.8 kg (202 lb 6.1 oz) IBW/kg (Calculated) : 79.9 Heparin dosing weight: 92.4 kg  Vital Signs: Temp: 98.3 F (36.8 C) (08/13 1135) Temp Source: Axillary (08/13 1135) BP: 132/81 (08/13 1300) Pulse Rate: 104 (08/13 1300)  Labs: Recent Labs    05/01/23 0453 05/02/23 0319 05/02/23 1651 05/03/23 0357 05/03/23 1256  HGB 11.8* 11.6*  --  11.9*  --   HCT 35.3* 34.7*  --  35.4*  --   PLT 274 258  --  253  --   HEPARINUNFRC 0.59 0.73* 0.67 0.80* 0.69  CREATININE 4.52* 3.60*  --  2.78*  --     Estimated Creatinine Clearance: 27.9 mL/min (A) (by C-G formula based on SCr of 2.78 mg/dL (H)).   Medical History: Past Medical History:  Diagnosis Date   Adrenal adenoma, left    Arthritis    Bursitis of elbow    Complication of anesthesia    ANESTHESIA DID NOT TAKE EFFECT WITH BACK SURGERY "I FELT THEM CUT"   Congenital absence of right kidney    Coronary artery disease    Diverticulosis of colon    Family history of anesthesia complication    "hard time waking up my dad" many yrs ago   Frequency of urination    Full dentures    GAD (generalized anxiety disorder)    GERD (gastroesophageal reflux disease)    History of acute renal failure    12-16-2013;  09-24-2017 due to stone obstruction   History of duodenal ulcer 04/2000   w/ gi bleed post egd w/ cauterization (caused by chronic nsaid use)   History of encephalopathy 04/26/2014   ACUTE EPISODE SECONDARY TO DRUG OVERDOSE (NARC, BZD, FLEXERIL)   History of GI bleed    08/ 2001   duodenal ulcer bleed post egd w/ cauterization   History of kidney stones    History of prostatitis    Hyperlipidemia    Hypertension     Left bundle branch block 05/05/2021   Left ureteral stone    Nocturia more than twice per night    Peripheral neuropathy    bilateral leg numbness due to back issues, walks w/ cane   Pneumonia    PONV (postoperative nausea and vomiting)    Restless leg syndrome    Solitary left kidney    Wears glasses     Assessment: 72 y/o M with COVID-19, acute on chronic kidney failure, and new onset afib. Pharmacy consulted to manage heparin infusion.   CBC Hgb 11.9, Plt 253 - low stable HL 0.69 - at upper end of therapeutic  Goal of Therapy:  Heparin level 0.3-0.7 units/ml Monitor platelets by anticoagulation protocol: Yes   Plan:  Decrease heparin slightly to 1850 units/hr to maintain therapeutic levels Daily HL, CBC F/u transition to enteral anticoagulation as appropriate  Calton Dach, PharmD Clinical Pharmacist 05/03/2023 1:40 PM

## 2023-05-03 NOTE — Progress Notes (Addendum)
NAME:  Bradley Morgan, MRN:  [id number], DOB:  12-08-1950, LOS: 5 ADMISSION DATE:  04/27/2023, CONSULTATION DATE:  04/28/23 REFERRING MD:  Olevia Bowens, TRH, CHIEF COMPLAINT:  Respiratory distress   History of Present Illness:  Mr. Pickar is a 72 yo male with a PMH of CHF (EF 45%), HTN, HLD, CKD3a, and chronic LBBB who presented for progressive AMS, lethargy, and SOB over the last 7 days. Presented to ED and found to be afebrile, tachycardic to 140 with evidence of new-onset afib with RVR, tachypnea to 25, and hypoxemia to 90-93% O2 requiring 3L. WBC 22.6, Cr 6.6, lactic acid 3.2. Patient is COVID+. VBG with normal pH, low CO2, low bicarb. CXR with large consolidation in RLL. CT head normal. He was admitted to progressive on IV azithro and CTX for PNA. Outside window for remdesevir. Rates more reassuring at that time so no rate control given. Once on the floor, rapid was called for worsening respiratory distress now requiring 6L. He remained in afib with RVR. PCCM was ultimately consulted for further recommendations.  Pertinent  Medical History   Past Medical History:  Diagnosis Date   Adrenal adenoma, left    Arthritis    Bursitis of elbow    Complication of anesthesia    ANESTHESIA DID NOT TAKE EFFECT WITH BACK SURGERY "I FELT THEM CUT"   Congenital absence of right kidney    Coronary artery disease    Diverticulosis of colon    Family history of anesthesia complication    "hard time waking up my dad" many yrs ago   Frequency of urination    Full dentures    GAD (generalized anxiety disorder)    GERD (gastroesophageal reflux disease)    History of acute renal failure    12-16-2013;  09-24-2017 due to stone obstruction   History of duodenal ulcer 04/2000   w/ gi bleed post egd w/ cauterization (caused by chronic nsaid use)   History of encephalopathy 04/26/2014   ACUTE EPISODE SECONDARY TO DRUG OVERDOSE (NARC, BZD, FLEXERIL)   History of GI bleed    08/ 2001   duodenal ulcer bleed post  egd w/ cauterization   History of kidney stones    History of prostatitis    Hyperlipidemia    Hypertension    Left bundle branch block 05/05/2021   Left ureteral stone    Nocturia more than twice per night    Peripheral neuropathy    bilateral leg numbness due to back issues, walks w/ cane   Pneumonia    PONV (postoperative nausea and vomiting)    Restless leg syndrome    Solitary left kidney    Wears glasses    Significant Hospital Events: Including procedures, antibiotic start and stop dates in addition to other pertinent events   8/8 - admitted with PNA on IV abx, resp distress worsened requiring 3>6L O2 necessitating PCCM consult 04/29/2023 transferred to intensive care unit 8/10 Intubated for respiratory failure  Interim History / Subjective:  Continues to have copious amount of thick secretions via ET tube despite bronchoscope yesterday Spiked fever with Tmax 100.8 Agitated, restless Remain in A-fib with controlled rate  Objective   Blood pressure 92/63, pulse 82, temperature 99 F (37.2 C), temperature source Axillary, resp. rate 19, height 6\' 1"  (1.854 m), weight 91.8 kg, SpO2 92%.    Vent Mode: PRVC FiO2 (%):  [40 %-60 %] 50 % Set Rate:  [18 bmp] 18 bmp Vt Set:  [865 mL] 630 mL PEEP:  [  5 cmH20] 5 cmH20 Plateau Pressure:  [19 cmH20-30 cmH20] 19 cmH20   Intake/Output Summary (Last 24 hours) at 05/03/2023 0827 Last data filed at 05/03/2023 0400 Gross per 24 hour  Intake 3986.82 ml  Output 2020 ml  Net 1966.82 ml   Filed Weights   05/01/23 0320 05/02/23 0500 05/03/23 0500  Weight: 98.1 kg 91.8 kg 91.8 kg   Physical Exam: General: Crtitically ill-appearing male, orally intubated HEENT: [place]/AT, eyes anicteric.  ETT and OGT in place Neuro: Eyes, does not open, not following commands, agitated, restless, trying to get out of bed Chest: Bilateral basal crackles, no wheezes or rhonchi Heart: Irregularly irregular, no murmurs or gallops Abdomen: Soft, nondistended,  bowel sounds present Skin: No rash  Labs and images were reviewed  Resolved Hospital problem:  Hyponatremia Hyperphosphatemia  Assessment & Plan:  Sepsis due to streptococcal pneumonia, POA Acute hypoxemic respiratory failure  Acute septic encephalopathy COVID-19 pneumonia Continue full support mechanical ventilation VAP prevention bundle in place Switch Precedex with propofol infusion Continue fentanyl infusion Repeat respiratory culture showing no organism but abundant WBCs Continue IV antibiotics Continue IV Decadron for 10 days therapy Continue Pulmicort and Xopenex nebs  Paroxysmal atrial fibrillation with RVR Chronic LBBB 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%) HTN Remain in A-fib with controlled rate Continue amiodarone Decrease Coreg to 3.125 mg daily Appreciate cardiology input Continue IV heparin infusion for stroke prophylaxis  AKI on CKD3a HAGMA Solitary kidney Continue gentle IV fluid hydration Serum creatinine continue to improve, now down to 2.7 Serum bicarbonate has improved to 21 Continue sodium bicarbonate per tube Avoid nephrotoxic agent Nephrology is following  Chronic pain Continue gabapentin 100 mg BID Holding home flexiril Continue PRN norco  Severe protein calorie malnutrition Continue dietary supplements Dietitian is following  Best practice Diet: Tube feeds DVT: systemic heparin  GI:  protonix BID  Patient's daughter and patient's wife were updated, please see IPal note  The patient is critically ill due to acute respiratory failure with hypoxia, pneumococcal pneumonia, COVID-19 p Pneumonia. Critical care was necessary to treat or prevent imminent or life-threatening deterioration.  Critical care was time spent personally by me on the following activities: development of treatment plan with patient and/or surrogate as well as nursing, discussions with consultants, evaluation of patient's response to  treatment, examination of patient, obtaining history from patient or surrogate, ordering and performing treatments and interventions, ordering and review of laboratory studies, ordering and review of radiographic studies, pulse oximetry, re-evaluation of patient's condition and participation in multidisciplinary rounds.   During this encounter critical care time was devoted to patient care services described in this note for 35 minutes.     Cheri Fowler, MD [facility] See Amion for pager If no response to pager, please call [phone] until 7pm After 7pm, Please call E-link [phone]

## 2023-05-03 NOTE — Progress Notes (Signed)
72 y.o. yo male with ischemic cardiomyopathy, A fib, solitary kidney with baseline stage 3 CKD  who was admitted on 04/22/2023 with confusion-  diagnosed with bacterial PNA and covid-  also found to have worsening EF and Afib with RVR   1. Renal-  crt 1.6 in May of 23, 1.22 in April of 24.  Now with  A on CRF in the setting of PNA, Afib with RVR-  imaging does not show hydro-  urine with 100 of protein- no RBC, min WBC.  Renal function trending worse since admit but non oliguric.  Suspect mostly hemodynamic given the scenario but also an element of rhabdo-  was on ARB as OP-  has been held. Thought he had a high risk of progressing to needing dialysis but has held his own so far.  The last 24 hours 1600 mL UOP and renal function slowly improving. no acute dialysis needs today and renal function continues to improve with good UOP.   Signing off at this time; please reconsult as needed.  2. Metabolic acidosis- elevated lactate on maintenance LR 63ml/hr to support BP and acidosis-  also on bicarb-  moving in right direction 3. Anemia-  surprisingly good-  indicates was likely dry   4. HTN/volume- seems euvolemic-  not overloaded   5 ID-  given tx for  covid and PNA-  WBC improving   6. Resp failure-  intubated on 8/10  Subjective:  ended up getting intubated-   not a big surprise. Had been very agitated still on sedation  Objective Vital signs in last 24 hours: Vitals:   05/03/23 0400 05/03/23 0500 05/03/23 0721 05/03/23 0748  BP: 92/63     Pulse: 74  82   Resp: 19     Temp:    99 F (37.2 C)  TempSrc:    Axillary  SpO2: 92%     Weight:  91.8 kg    Height:       Weight change: 0 kg  Intake/Output Summary (Last 24 hours) at 05/03/2023 0851 Last data filed at 05/03/2023 0400 Gross per 24 hour  Intake 3986.82 ml  Output 2020 ml  Net 1966.82 ml   Savon Cobbs W    Labs: Basic Metabolic Panel: Recent Labs  Lab 05/01/23 0453 05/01/23 1149 05/01/23 1719 05/02/23 0319 05/02/23 1651  05/03/23 0357  NA 140  --   --  144  --  144  K 4.5  --   --  4.5  --  3.9  CL 103  --   --  106  --  109  CO2 18*  --   --  21*  --  21*  GLUCOSE 112*  --   --  134*  --  145*  BUN 99*  --   --  95*  --  82*  CREATININE 4.52*  --   --  3.60*  --  2.78*  CALCIUM 8.3*  --   --  8.8*  --  8.2*  PHOS  --    < > 5.4* 4.2 3.3  --    < > = values in this interval not displayed.   Liver Function Tests: Recent Labs  Lab 04/29/23 0638 04/30/23 0027 05/01/23 0453  AST 61* 51* 28  ALT 33 32 27  ALKPHOS 76 78 70  BILITOT 0.6 0.6 0.5  PROT 5.8* 5.6* 5.2*  ALBUMIN 2.3* 2.4* 2.1*   No results for input(s): "LIPASE", "AMYLASE" in the last 168 hours. No results  for input(s): "AMMONIA" in the last 168 hours. CBC: Recent Labs  Lab 05/02/2023 0020 04/28/23 0031 04/28/23 0454 04/29/23 1610 04/30/23 0027 04/30/23 1947 05/01/23 0453 05/02/23 0319 05/03/23 0357  WBC 22.6*  --  18.9* 22.0* 17.2*  --  11.6* 11.8* 20.7*  NEUTROABS 20.2*  --  17.4*  --   --   --   --   --   --   HGB 14.0   < > 12.4* 13.5 12.5*   < > 11.8* 11.6* 11.9*  HCT 41.4   < > 37.6* 38.7* 36.6*   < > 35.3* 34.7* 35.4*  MCV 88.1  --  88.7 84.5 85.7  --  87.6 88.5 91.7  PLT 237  --  187 270 259  --  274 258 253   < > = values in this interval not displayed.   Cardiac Enzymes: Recent Labs  Lab 04/28/23 0454 04/29/23 0638  CKTOTAL 4,057* 2,671*   CBG: Recent Labs  Lab 05/02/23 1613 05/02/23 1932 05/02/23 2318 05/03/23 0323 05/03/23 0743  GLUCAP 120* 148* 126* 135* 141*    Iron Studies: No results for input(s): "IRON", "TIBC", "TRANSFERRIN", "FERRITIN" in the last 72 hours. Studies/Results: DG Abd Portable 1V  Result Date: 05/02/2023 CLINICAL DATA:  Feeding tube placement EXAM: PORTABLE ABDOMEN - 1 VIEW COMPARISON:  04/30/2023 FINDINGS: There is interval removal of NG tube and placement of feeding tube with its tip in the antrum of the stomach. Bowel gas pattern in the upper abdomen is unremarkable. There is  blunting of right lateral CP angle. Linear densities are seen in right lower lung field. IMPRESSION: Tip of feeding tube is seen in the antrum of the stomach. Electronically Signed   By: Ernie Avena M.D.   On: 05/02/2023 15:37   Medications: Infusions:  ampicillin-sulbactam (UNASYN) IV     dexmedetomidine (PRECEDEX) IV infusion 1.2 mcg/kg/hr (05/03/23 0400)   feeding supplement (VITAL 1.5 CAL) 55 mL/hr at 05/03/23 0400   fentaNYL infusion INTRAVENOUS 125 mcg/hr (05/03/23 0503)   heparin 1,900 Units/hr (05/03/23 0520)   lactated ringers 75 mL/hr at 05/03/23 0400   propofol (DIPRIVAN) infusion 30 mcg/kg/min (05/03/23 0818)    Scheduled Medications:  acetylcysteine  4 mL Nebulization Q6H   amiodarone  200 mg Per Tube BID   budesonide (PULMICORT) nebulizer solution  0.5 mg Nebulization BID   carvedilol  3.125 mg Per Tube BID   Chlorhexidine Gluconate Cloth  6 each Topical Daily   dexamethasone  6 mg Per Tube Daily   feeding supplement (PROSource TF20)  60 mL Per Tube Daily   fentaNYL (SUBLIMAZE) injection  25 mcg Intravenous Once   gabapentin  100 mg Per Tube Q12H   levalbuterol  0.63 mg Nebulization BID   mouth rinse  15 mL Mouth Rinse Q2H   pantoprazole (PROTONIX) IV  40 mg Intravenous Q12H   rOPINIRole  1 mg Per Tube QHS   sodium bicarbonate  650 mg Per Tube BID   sodium chloride flush  10-40 mL Intracatheter Q12H    have reviewed scheduled and prn medications.  Physical Exam: General: intubated/sedated  Heart: irreg Lungs: CBS bilat  Abdomen: soft, non tender Extremities: tr edema GU: male purewick    05/03/2023,8:51 AM  LOS: 5 days

## 2023-05-03 NOTE — Progress Notes (Signed)
ANTICOAGULATION CONSULT NOTE   Pharmacy Consult for Heparin  Indication: atrial fibrillation  Allergies  Allergen Reactions   Aspirin Other (See Comments)    Stomach ulcers. No high doses   Nsaids Other (See Comments)    CAUSES STOMACH ULCERS     Patient Measurements: Height: 6\' 1"  (185.4 cm) Weight: 91.8 kg (202 lb 6.1 oz) IBW/kg (Calculated) : 79.9 Heparin dosing weight: 92.4 kg  Vital Signs: Temp: 99 F (37.2 C) (08/13 0339) Temp Source: Axillary (08/13 0339) BP: 92/63 (08/13 0400) Pulse Rate: 74 (08/13 0400)  Labs: Recent Labs    05/01/23 0453 05/02/23 0319 05/02/23 1651 05/03/23 0357  HGB 11.8* 11.6*  --  11.9*  HCT 35.3* 34.7*  --  35.4*  PLT 274 258  --  253  HEPARINUNFRC 0.59 0.73* 0.67 0.80*  CREATININE 4.52* 3.60*  --  2.78*    Estimated Creatinine Clearance: 27.9 mL/min (A) (by C-G formula based on SCr of 2.78 mg/dL (H)).   Assessment: 72 y/o M with COVID-19, acute on chronic kidney failure, and new onset afib. Pharmacy consulted to manage heparin infusion.   Heparin level therapeutic (0.67) on infusion at 2100 units/hr. No bleeding noted.  8/13 AM update:  Heparin level supra-therapeutic   Goal of Therapy:  Heparin level 0.3-0.7 units/ml Monitor platelets by anticoagulation protocol: Yes   Plan:  Dec heparin to 1900 units/hr 1300 heparin level  Abran Duke, PharmD, BCPS Clinical Pharmacist Phone: [phone]

## 2023-05-03 NOTE — Plan of Care (Signed)
  Problem: Clinical Measurements: Goal: Will remain free from infection Outcome: Progressing   Problem: Clinical Measurements: Goal: Diagnostic test results will improve Outcome: Progressing   Problem: Clinical Measurements: Goal: Respiratory complications will improve Outcome: Progressing   Problem: Clinical Measurements: Goal: Cardiovascular complication will be avoided Outcome: Progressing   Problem: Activity: Goal: Risk for activity intolerance will decrease Outcome: Progressing   Problem: Nutrition: Goal: Adequate nutrition will be maintained Outcome: Progressing   Problem: Coping: Goal: Level of anxiety will decrease Outcome: Progressing   Problem: Elimination: Goal: Will not experience complications related to bowel motility Outcome: Progressing   Problem: Pain Managment: Goal: General experience of comfort will improve Outcome: Progressing   Problem: Safety: Goal: Ability to remain free from injury will improve Outcome: Progressing   Problem: Skin Integrity: Goal: Risk for impaired skin integrity will decrease Outcome: Progressing

## 2023-05-03 NOTE — Progress Notes (Signed)
CPT held at this time due to patient agitation.

## 2023-05-04 DIAGNOSIS — J189 Pneumonia, unspecified organism: Secondary | ICD-10-CM | POA: Diagnosis not present

## 2023-05-04 DIAGNOSIS — J1569 Pneumonia due to other gram-negative bacteria: Secondary | ICD-10-CM | POA: Diagnosis not present

## 2023-05-04 DIAGNOSIS — J9601 Acute respiratory failure with hypoxia: Secondary | ICD-10-CM | POA: Diagnosis not present

## 2023-05-04 LAB — GLUCOSE, CAPILLARY
Glucose-Capillary: 139 mg/dL — ABNORMAL HIGH (ref 70–99)
Glucose-Capillary: 152 mg/dL — ABNORMAL HIGH (ref 70–99)
Glucose-Capillary: 113 mg/dL — ABNORMAL HIGH (ref 70–99)
Glucose-Capillary: 151 mg/dL — ABNORMAL HIGH (ref 70–99)
Glucose-Capillary: 132 mg/dL — ABNORMAL HIGH (ref 70–99)
Glucose-Capillary: 137 mg/dL — ABNORMAL HIGH (ref 70–99)

## 2023-05-04 MED ORDER — SODIUM CHLORIDE 0.9 % IV SOLN
1.0000 g | Freq: Two times a day (BID) | INTRAVENOUS | Status: DC
  Administered 2023-05-04 – 2023-05-06 (×5): 1 g via INTRAVENOUS
  Filled 2023-05-04 (×5): qty 20

## 2023-05-04 MED ORDER — FREE WATER
100.0000 mL | Freq: Four times a day (QID) | Status: DC
  Administered 2023-05-04 – 2023-05-05 (×4): 100 mL

## 2023-05-04 MED ORDER — LINEZOLID 600 MG PO TABS
600.0000 mg | ORAL_TABLET | Freq: Two times a day (BID) | ORAL | Status: DC
  Administered 2023-05-04 – 2023-05-06 (×5): 600 mg
  Filled 2023-05-04 (×6): qty 1

## 2023-05-04 NOTE — Progress Notes (Signed)
Cardiology signing off. Follow up will be made. Please let us know if you have questions.

## 2023-05-04 NOTE — Progress Notes (Signed)
Heart Failure Navigator Progress Note  Assessed for Heart & Vascular TOC clinic readiness.  Patient has a scheduled [facility] appointment on 05/19/23, Renal consulted may need dialysis and / or CRRT this admission. .   Navigator will sign off at this time.   Rhae Hammock, BSN, Scientist, clinical (histocompatibility and immunogenetics) Only

## 2023-05-04 NOTE — Progress Notes (Signed)
PCCM Interval Progress Note:  Updated patient's son in depth at bedside re: patient's clinical status, Cx results and antibiotic changes.  Contacted patient's daughter, Cordelia Pen via phone at [phone]. Provided clinical updates as well as Cx data details (Resp Cx with klebsiella/staph) and antibiotic changes. We discussed Harold's infection's resistance to his previous antibiotic and change to Meropenem and Linezolid. Given his copious secretions, would recommend another 24-48H on broader antibiotics prior to discussion of extubation. If no significant improvement at that time, would revisit GOC and one-way extubation. Additionally, patient's renal function is improving.  All questions answered to family's satisfaction; they will be at the hospital toomorrow and we will provide further updates at that time.  Tim Lair, PA-C [facility] 05/04/23 3:03 PM  Please see [email or web address] for pager details.  From 7A-7P if no response, please call [phone] After hours, please call ELink [phone]

## 2023-05-04 NOTE — Progress Notes (Signed)
NAME:  Bradley Morgan, MRN:  [id number], DOB:  1951-05-27, LOS: 6 ADMISSION DATE:  04/27/2023, CONSULTATION DATE:  04/28/23 REFERRING MD:  Olevia Bowens, TRH, CHIEF COMPLAINT:  Respiratory distress   History of Present Illness:  Bradley Morgan is a 72 yo male with a PMH of CHF (EF 45%), HTN, HLD, CKD3a, and chronic LBBB who presented for progressive AMS, lethargy, and SOB over the last 7 days. Presented to ED and found to be afebrile, tachycardic to 140 with evidence of new-onset afib with RVR, tachypnea to 25, and hypoxemia to 90-93% O2 requiring 3L. WBC 22.6, Cr 6.6, lactic acid 3.2. Patient is COVID+. VBG with normal pH, low CO2, low bicarb. CXR with large consolidation in RLL. CT head normal. He was admitted to progressive on IV azithro and CTX for PNA. Outside window for remdesevir. Rates more reassuring at that time so no rate control given. Once on the floor, rapid was called for worsening respiratory distress now requiring 6L. He remained in afib with RVR. PCCM was ultimately consulted for further recommendations.  Pertinent  Medical History    has a past medical history of Adrenal adenoma, left, Arthritis, Bursitis of elbow, Complication of anesthesia, Congenital absence of right kidney, Coronary artery disease, Diverticulosis of colon, Family history of anesthesia complication, Frequency of urination, Full dentures, GAD (generalized anxiety disorder), GERD (gastroesophageal reflux disease), History of acute renal failure, History of duodenal ulcer (04/2000), History of encephalopathy (04/26/2014), History of GI bleed, History of kidney stones, History of prostatitis, Hyperlipidemia, Hypertension, Left bundle branch block (05/05/2021), Left ureteral stone, Nocturia more than twice per night, Peripheral neuropathy, Pneumonia, PONV (postoperative nausea and vomiting), Restless leg syndrome, Solitary left kidney, and Wears glasses.  Significant Hospital Events: Including procedures, antibiotic start and stop  dates in addition to other pertinent events   8/8 - admitted with PNA on IV abx, resp distress worsened requiring 3>6L O2 necessitating PCCM consult 04/29/2023 transferred to intensive care unit 8/10 Intubated for respiratory failure  Interim History / Subjective:  Ongoing difficulty with secretions, not tol CPT r/t agitation  Afebrile overnight  Scr trending down, good UOP   Objective   Blood pressure (!) 90/55, pulse 73, temperature 97.7 F (36.5 C), temperature source Oral, resp. rate 18, height 6\' 1"  (1.854 m), weight 95.5 kg, SpO2 93%.    Vent Mode: PRVC FiO2 (%):  [40 %-50 %] 40 % Set Rate:  [18 bmp] 18 bmp Vt Set:  [630 mL] 630 mL PEEP:  [5 cmH20] 5 cmH20 Plateau Pressure:  [17 cmH20-23 cmH20] 17 cmH20   Intake/Output Summary (Last 24 hours) at 05/04/2023 0844 Last data filed at 05/04/2023 0600 Gross per 24 hour  Intake 4408.01 ml  Output 500 ml  Net 3908.01 ml   Filed Weights   05/02/23 0500 05/03/23 0500 05/04/23 0440  Weight: 91.8 kg 91.8 kg 95.5 kg   Physical Exam: General: Crtitically ill-appearing male, NAD HEENT: mm moist, ETT, no JVD Neuro: easily agitated per nsg, currently sedated, RASS -1, does not follow commands, MAE spontaneously when agitated  Chest: resps even non labored on vent, coarse, no wheeze  Heart: Irregularly irregular, no murmurs or gallops Abdomen: Soft, nondistended, bowel sounds present Skin: No rash  Labs and images were reviewed  Resolved Hospital problem:  Hyponatremia Hyperphosphatemia  Assessment & Plan:  Sepsis due to streptococcal pneumonia, POA Acute hypoxemic respiratory failure  Acute septic encephalopathy COVID-19 pneumonia Continue full support mechanical ventilation VAP prevention bundle in place Continue propofol - precedex was inadequate  for sedation and vent compliance  Continue fentanyl infusion Repeat respiratory culture showing no organism but abundant WBCs Continue IV antibiotics Continue IV Decadron for 10  days therapy - stop date in place  Continue Pulmicort and Xopenex nebs Daily WUA and SBT when appropriate  Mucomyst nebs   Paroxysmal atrial fibrillation with RVR Chronic LBBB 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%) HTN Continue amiodarone Decrease Coreg to 3.125 mg daily Appreciate cardiology input Continue IV heparin infusion per pharmacy   AKI on CKD3a HAGMA Solitary kidney Hypernatremia  Continue gentle IV fluid hydration Serum creatinine improving, good UOP  Add free water  F/u chem  Continue sodium bicarbonate per tube Avoid nephrotoxic agent Nephrology is following  AMS - agitated delirium  PAD protocol goal RASS -1 Propofol, fentanyl gtts  Wean as able, daily WUA   Chronic pain Continue gabapentin 100 mg BID Holding home flexiril Fentanyl gtt   Severe protein calorie malnutrition TF  Dietitian is following  Best practice Diet: Tube feeds DVT: systemic heparin  GI:  protonix BID  Ongoing goals of care discussions with family   The patient is critically ill due to acute respiratory failure with hypoxia, pneumococcal pneumonia, COVID-19 p Pneumonia. Critical care was necessary to treat or prevent imminent or life-threatening deterioration.  Critical care was time spent personally by me on the following activities: development of treatment plan with patient and/or surrogate as well as nursing, discussions with consultants, evaluation of patient's response to treatment, examination of patient, obtaining history from patient or surrogate, ordering and performing treatments and interventions, ordering and review of laboratory studies, ordering and review of radiographic studies, pulse oximetry, re-evaluation of patient's condition and participation in multidisciplinary rounds.   During this encounter critical care time was devoted to patient care services described in this note for 33 minutes.  Dirk Dress, NP Pulmonary/Critical  Care Medicine  05/04/2023  8:44 AM   See Loretha Stapler for personal pager PCCM on call pager [phone] until 7pm. Please call Elink 7p-7a. [phone]

## 2023-05-04 NOTE — Progress Notes (Signed)
ANTICOAGULATION CONSULT NOTE   Pharmacy Consult for Heparin  Indication: atrial fibrillation  Allergies  Allergen Reactions   Aspirin Other (See Comments)    Stomach ulcers. No high doses   Nsaids Other (See Comments)    CAUSES STOMACH ULCERS     Patient Measurements: Height: 6\' 1"  (185.4 cm) Weight: 95.5 kg (210 lb 8.6 oz) IBW/kg (Calculated) : 79.9 Heparin dosing weight: 92.4 kg  Vital Signs: Temp: 97.9 F (36.6 C) (08/14 1130) Temp Source: Oral (08/14 1130) BP: 90/55 (08/14 0600) Pulse Rate: 73 (08/14 0600)  Labs: Recent Labs    05/02/23 0319 05/02/23 1651 05/03/23 0357 05/03/23 1256 05/04/23 0449  HGB 11.6*  --  11.9*  --   --   HCT 34.7*  --  35.4*  --   --   PLT 258  --  253  --   --   HEPARINUNFRC 0.73*   < > 0.80* 0.69 0.48  CREATININE 3.60*  --  2.78*  --  2.37*   < > = values in this interval not displayed.    Estimated Creatinine Clearance: 32.8 mL/min (A) (by C-G formula based on SCr of 2.37 mg/dL (H)).   Medical History: Past Medical History:  Diagnosis Date   Adrenal adenoma, left    Arthritis    Bursitis of elbow    Complication of anesthesia    ANESTHESIA DID NOT TAKE EFFECT WITH BACK SURGERY "I FELT THEM CUT"   Congenital absence of right kidney    Coronary artery disease    Diverticulosis of colon    Family history of anesthesia complication    "hard time waking up my dad" many yrs ago   Frequency of urination    Full dentures    GAD (generalized anxiety disorder)    GERD (gastroesophageal reflux disease)    History of acute renal failure    12-16-2013;  09-24-2017 due to stone obstruction   History of duodenal ulcer 04/2000   w/ gi bleed post egd w/ cauterization (caused by chronic nsaid use)   History of encephalopathy 04/26/2014   ACUTE EPISODE SECONDARY TO DRUG OVERDOSE (NARC, BZD, FLEXERIL)   History of GI bleed    08/ 2001   duodenal ulcer bleed post egd w/ cauterization   History of kidney stones    History of prostatitis     Hyperlipidemia    Hypertension    Left bundle branch block 05/05/2021   Left ureteral stone    Nocturia more than twice per night    Peripheral neuropathy    bilateral leg numbness due to back issues, walks w/ cane   Pneumonia    PONV (postoperative nausea and vomiting)    Restless leg syndrome    Solitary left kidney    Wears glasses     Assessment: 72 y/o M with COVID-19, acute on chronic kidney failure, and new onset afib. Pharmacy consulted to manage heparin infusion.   8/13 CBC Hgb 11.9, Plt 253 - low stable, no CBC 8/14 HL 0.48- at upper end of therapeutic  Goal of Therapy:  Heparin level 0.3-0.7 units/ml Monitor platelets by anticoagulation protocol: Yes   Plan:  Continue 1850 units/hr to maintain therapeutic levels Daily HL, CBC F/u transition to enteral anticoagulation as appropriate  Stephenie Acres, PharmD PGY1 Pharmacy Resident 05/04/2023 11:34 AM

## 2023-05-05 ENCOUNTER — Inpatient Hospital Stay (HOSPITAL_COMMUNITY): Payer: Medicare HMO

## 2023-05-05 DIAGNOSIS — N179 Acute kidney failure, unspecified: Secondary | ICD-10-CM | POA: Diagnosis not present

## 2023-05-05 DIAGNOSIS — J1569 Pneumonia due to other gram-negative bacteria: Secondary | ICD-10-CM | POA: Diagnosis not present

## 2023-05-05 DIAGNOSIS — J189 Pneumonia, unspecified organism: Secondary | ICD-10-CM | POA: Diagnosis not present

## 2023-05-05 DIAGNOSIS — J9601 Acute respiratory failure with hypoxia: Secondary | ICD-10-CM | POA: Diagnosis not present

## 2023-05-05 LAB — BASIC METABOLIC PANEL
Anion gap: 8 (ref 5–15)
BUN: 79 mg/dL — ABNORMAL HIGH (ref 8–23)
CO2: 24 mmol/L (ref 22–32)
Calcium: 8 mg/dL — ABNORMAL LOW (ref 8.9–10.3)
Chloride: 116 mmol/L — ABNORMAL HIGH (ref 98–111)
Creatinine, Ser: 2.27 mg/dL — ABNORMAL HIGH (ref 0.61–1.24)
GFR, Estimated: 30 mL/min — ABNORMAL LOW (ref >60)
Glucose, Bld: 110 mg/dL — ABNORMAL HIGH (ref 70–99)
Potassium: 4.8 mmol/L (ref 3.5–5.1)
Sodium: 148 mmol/L — ABNORMAL HIGH (ref 135–145)

## 2023-05-05 LAB — GLUCOSE, CAPILLARY
Glucose-Capillary: 111 mg/dL — ABNORMAL HIGH (ref 70–99)
Glucose-Capillary: 109 mg/dL — ABNORMAL HIGH (ref 70–99)
Glucose-Capillary: 121 mg/dL — ABNORMAL HIGH (ref 70–99)
Glucose-Capillary: 130 mg/dL — ABNORMAL HIGH (ref 70–99)
Glucose-Capillary: 128 mg/dL — ABNORMAL HIGH (ref 70–99)
Glucose-Capillary: 129 mg/dL — ABNORMAL HIGH (ref 70–99)

## 2023-05-05 LAB — CBC
HCT: 33.8 % — ABNORMAL LOW (ref 39.0–52.0)
Hemoglobin: 11.1 g/dL — ABNORMAL LOW (ref 13.0–17.0)
MCH: 30.3 pg (ref 26.0–34.0)
MCHC: 32.8 g/dL (ref 30.0–36.0)
MCV: 92.3 fL (ref 80.0–100.0)
Platelets: 286 10*3/uL (ref 150–400)
RBC: 3.66 MIL/uL — ABNORMAL LOW (ref 4.22–5.81)
RDW: 16.9 % — ABNORMAL HIGH (ref 11.5–15.5)
WBC: 12.9 10*3/uL — ABNORMAL HIGH (ref 4.0–10.5)
nRBC: 0.5 % — ABNORMAL HIGH (ref 0.0–0.2)

## 2023-05-05 LAB — HEPARIN LEVEL (UNFRACTIONATED)
Heparin Unfractionated: 0.75 [IU]/mL — ABNORMAL HIGH (ref 0.30–0.70)
Heparin Unfractionated: 0.67 [IU]/mL (ref 0.30–0.70)

## 2023-05-05 MED ORDER — QUETIAPINE FUMARATE 50 MG PO TABS
50.0000 mg | ORAL_TABLET | Freq: Two times a day (BID) | ORAL | Status: DC
  Administered 2023-05-05 – 2023-05-06 (×3): 50 mg
  Filled 2023-05-05 (×3): qty 1

## 2023-05-05 MED ORDER — AMIODARONE HCL 200 MG PO TABS
200.0000 mg | ORAL_TABLET | Freq: Two times a day (BID) | ORAL | Status: DC
  Administered 2023-05-05 – 2023-05-06 (×2): 200 mg
  Filled 2023-05-05 (×2): qty 1

## 2023-05-05 MED ORDER — AMIODARONE HCL 200 MG PO TABS: 200.0000 mg | ORAL_TABLET | Freq: Every day | ORAL | Status: DC

## 2023-05-05 MED ORDER — FUROSEMIDE 10 MG/ML IJ SOLN
40.0000 mg | Freq: Once | INTRAMUSCULAR | Status: AC
  Administered 2023-05-05: 40 mg via INTRAVENOUS
  Filled 2023-05-05: qty 4

## 2023-05-05 MED ORDER — FREE WATER
200.0000 mL | Freq: Four times a day (QID) | Status: DC
  Administered 2023-05-05 – 2023-05-06 (×4): 200 mL

## 2023-05-05 MED ORDER — AMIODARONE HCL 200 MG PO TABS: 200.0000 mg | ORAL_TABLET | Freq: Two times a day (BID) | ORAL | Status: DC

## 2023-05-05 NOTE — Progress Notes (Signed)
ANTICOAGULATION CONSULT NOTE- Follow Up  Pharmacy Consult for Heparin  Indication: atrial fibrillation  Allergies  Allergen Reactions   Aspirin Other (See Comments)    Stomach ulcers. No high doses   Nsaids Other (See Comments)    CAUSES STOMACH ULCERS     Patient Measurements: Height: 6\' 1"  (185.4 cm) Weight: 99.9 kg (220 lb 3.8 oz) IBW/kg (Calculated) : 79.9 Heparin dosing weight: 92.4 kg  Vital Signs: Temp: 98.8 F (37.1 C) (08/15 0330) Temp Source: Oral (08/15 0330) BP: 105/64 (08/15 0500) Pulse Rate: 65 (08/15 0500)  Labs: Recent Labs    05/03/23 0357 05/03/23 1256 05/04/23 0449 05/05/23 0517  HGB 11.9*  --   --  11.1*  HCT 35.4*  --   --  33.8*  PLT 253  --   --  286  HEPARINUNFRC 0.80* 0.69 0.48 0.75*  CREATININE 2.78*  --  2.37* 2.27*    Estimated Creatinine Clearance: 37.6 mL/min (A) (by C-G formula based on SCr of 2.27 mg/dL (H)).   Medical History: Past Medical History:  Diagnosis Date   Adrenal adenoma, left    Arthritis    Bursitis of elbow    Complication of anesthesia    ANESTHESIA DID NOT TAKE EFFECT WITH BACK SURGERY "I FELT THEM CUT"   Congenital absence of right kidney    Coronary artery disease    Diverticulosis of colon    Family history of anesthesia complication    "hard time waking up my dad" many yrs ago   Frequency of urination    Full dentures    GAD (generalized anxiety disorder)    GERD (gastroesophageal reflux disease)    History of acute renal failure    12-16-2013;  09-24-2017 due to stone obstruction   History of duodenal ulcer 04/2000   w/ gi bleed post egd w/ cauterization (caused by chronic nsaid use)   History of encephalopathy 04/26/2014   ACUTE EPISODE SECONDARY TO DRUG OVERDOSE (NARC, BZD, FLEXERIL)   History of GI bleed    08/ 2001   duodenal ulcer bleed post egd w/ cauterization   History of kidney stones    History of prostatitis    Hyperlipidemia    Hypertension    Left bundle branch block 05/05/2021    Left ureteral stone    Nocturia more than twice per night    Peripheral neuropathy    bilateral leg numbness due to back issues, walks w/ cane   Pneumonia    PONV (postoperative nausea and vomiting)    Restless leg syndrome    Solitary left kidney    Wears glasses     Assessment: 72 y/o M with COVID-19, acute on chronic kidney failure, and new onset afib. Pharmacy consulted to manage heparin infusion.   8/13 CBC Hgb 11.9, Plt 253 - low stable, no CBC 8/14 HL 0.48- at upper end of therapeutic  8/15 AM: heparin level; returned at 0.75 on 1850 units/h (supratherapeutic). Per RN, no issues with heparin gtt or signs/symptoms of bleeding. CBC stable  Goal of Therapy:  Heparin level 0.3-0.7 units/ml Monitor platelets by anticoagulation protocol: Yes   Plan:  Decrease to 1750 units/hr 8h heparin level Daily heparin level, CBC F/u transition to enteral anticoagulation as appropriate  Arabella Merles, PharmD. Clinical Pharmacist 05/05/2023 6:38 AM

## 2023-05-05 NOTE — Progress Notes (Addendum)
NAME:  Bradley Morgan, MRN:  [id number], DOB:  1950/12/31, LOS: 7 ADMISSION DATE:  04/27/2023, CONSULTATION DATE:  04/28/23 REFERRING MD:  Olevia Bowens, TRH, CHIEF COMPLAINT:  Resp distress   History of Present Illness:  [name] is a 72 yo male with a PMH of CHF (EF 45%), HTN, HLD, CKD3a, and chronic LBBB who presented for progressive AMS, lethargy, and SOB over the last 7 days. Presented to ED and found to be afebrile, tachycardic to 140 with evidence of new-onset afib with RVR, tachypnea to 25, and hypoxemia to 90-93% O2 requiring 3L. WBC 22.6, Cr 6.6, lactic acid 3.2. Patient is COVID+. VBG with normal pH, low CO2, low bicarb. CXR with large consolidation in RLL. CT head normal. He was admitted to progressive on IV azithro and CTX for PNA. Outside window for remdesevir. Rates more reassuring at that time so no rate control given. Once on the floor, rapid was called for worsening respiratory distress now requiring 6L. He remained in afib with RVR. PCCM was ultimately consulted for further recommendations.   Pertinent  Medical History   Past Medical History:  Diagnosis Date   Adrenal adenoma, left    Arthritis    Bursitis of elbow    Complication of anesthesia    ANESTHESIA DID NOT TAKE EFFECT WITH BACK SURGERY "I FELT THEM CUT"   Congenital absence of right kidney    Coronary artery disease    Diverticulosis of colon    Family history of anesthesia complication    "hard time waking up my dad" many yrs ago   Frequency of urination    Full dentures    GAD (generalized anxiety disorder)    GERD (gastroesophageal reflux disease)    History of acute renal failure    12-16-2013;  09-24-2017 due to stone obstruction   History of duodenal ulcer 04/2000   w/ gi bleed post egd w/ cauterization (caused by chronic nsaid use)   History of encephalopathy 04/26/2014   ACUTE EPISODE SECONDARY TO DRUG OVERDOSE (NARC, BZD, FLEXERIL)   History of GI bleed    08/ 2001   duodenal ulcer bleed post egd w/  cauterization   History of kidney stones    History of prostatitis    Hyperlipidemia    Hypertension    Left bundle branch block 05/05/2021   Left ureteral stone    Nocturia more than twice per night    Peripheral neuropathy    bilateral leg numbness due to back issues, walks w/ cane   Pneumonia    PONV (postoperative nausea and vomiting)    Restless leg syndrome    Solitary left kidney    Wears glasses    Significant Hospital Events: Including procedures, antibiotic start and stop dates in addition to other pertinent events   8/8 - admitted with PNA on IV abx, resp distress worsened requiring 3>6L O2 necessitating PCCM consult 04/29/2023 transferred to intensive care unit 8/10 Intubated for respiratory failure 8/14 remained intubated due to continued secretions, abx switched from unasyn to meropenem/linezolid due to sensitivities  Interim History / Subjective:  Able to intermittently wiggle toes on command. Able to do fairly well on PS/CPAP.  Objective   Blood pressure 105/64, pulse 65, temperature 98.4 F (36.9 C), temperature source Oral, resp. rate (!) 0, height 6\' 1"  (1.854 m), weight 99.9 kg, SpO2 93%.    Vent Mode: PRVC FiO2 (%):  [40 %] 40 % Set Rate:  [18 bmp] 18 bmp Vt Set:  [474 mL]  630 mL PEEP:  [5 cmH20] 5 cmH20 Plateau Pressure:  [16 cmH20-20 cmH20] 18 cmH20   Intake/Output Summary (Last 24 hours) at 05/05/2023 1102 Last data filed at 05/05/2023 8657 Gross per 24 hour  Intake 4318.17 ml  Output 1500 ml  Net 2818.17 ml   Filed Weights   05/03/23 0500 05/04/23 0440 05/05/23 0319  Weight: 91.8 kg 95.5 kg 99.9 kg    Examination: General: Lying in bed, NAD HENT: ETT in place, NCAT Lungs: Coarse breath sounds at bilateral bases Cardiovascular: Irregularly irregular rate and rhythm, no m/r/g Abdomen: Soft, nondistended, normoactive bowel sounds Extremities: Mild pitting edema to bilateral LE around ankles Neuro: Sedated, intermittently agitated and following  commands  Bedside US with sliding lung, B lines bilaterally, consolidated lung tissue at bases  Resolved Hospital Problem list   Hyponatremia Hyperphosphatemia  Assessment & Plan:  Sepsis due to streptococcal pneumonia, POA Acute hypoxemic respiratory failure  Acute septic encephalopathy COVID-19 pneumonia Continue full support mechanical ventilation VAP prevention bundle in place Continue propofol and fentanyl for now though add seroquel as below to help wean Repeat respiratory culture with abundant staph aureus and multidrug resistant klebsiella, unasyn switched to meropenem and linezolid (8/14-), will need to trial for 1-2 days and assess improvement before discussing extubation Continue IV Decadron for 10 days therapy - stop date in place  Continue Pulmicort and Xopenex nebs Daily WUA and SBT when appropriate  Mucomyst nebs    Paroxysmal atrial fibrillation with RVR Chronic LBBB 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%) HTN Continue amiodarone, stop date in place, start daily orally 200 mg at that time Coreg 3.125 mg daily Appreciate cardiology input, signed off now Continue IV heparin infusion per pharmacy    AKI on CKD3a HAGMA Solitary kidney Hypernatremia  Stop IVF for now given evolving overload and CHF history Give one dose IV lasix 40 mg and assess response Serum creatinine improving, now 2.27, good UOP  Continue free water bolus, increase to 200 mL Q6H F/u chem  Can stop bicarb given normalization Avoid nephrotoxic agent Nephrology is following   AMS - agitated delirium  PAD protocol goal RASS -1 Propofol, fentanyl gtts  Wean as able, daily WUA Seroquel added today after ensuring safe QTC, helps to get off sedation   Chronic pain Continue gabapentin 100 mg BID Holding home flexiril Fentanyl gtt    Severe protein calorie malnutrition TF  Dietitian is following  Best Practice (right click and "Reselect all SmartList  Selections" daily)   Diet/type: tubefeeds DVT prophylaxis: systemic heparin GI prophylaxis: PPI Lines: Central line and yes and it is still needed Foley:  N/A, external Code Status:  DNR Last date of multidisciplinary goals of care discussion [8/15 - daughter and son in law updated on unit, discussing having family meeting soon]  Labs   CBC: Recent Labs  Lab 04/30/23 0027 04/30/23 1947 05/01/23 0437 05/01/23 0453 05/02/23 0319 05/03/23 0357 05/05/23 0517  WBC 17.2*  --   --  11.6* 11.8* 20.7* 12.9*  HGB 12.5*   < > 11.6* 11.8* 11.6* 11.9* 11.1*  HCT 36.6*   < > 34.0* 35.3* 34.7* 35.4* 33.8*  MCV 85.7  --   --  87.6 88.5 91.7 92.3  PLT 259  --   --  274 258 253 286   < > = values in this interval not displayed.    Basic Metabolic Panel: Recent Labs  Lab 04/28/23 2117 04/29/23 8469 04/30/23 0027 04/30/23 1947 05/01/23 0453 05/01/23 1149  05/01/23 1719 05/02/23 0319 05/02/23 1651 05/03/23 0357 05/04/23 0449 05/05/23 0517  NA  --    < > 131*   < > 140  --   --  144  --  144 147* 148*  K  --    < > 3.8   < > 4.5  --   --  4.5  --  3.9 4.2 4.8  CL  --    < > 97*  --  103  --   --  106  --  109 115* 116*  CO2  --    < > 18*  --  18*  --   --  21*  --  21* 22 24  GLUCOSE  --    < > 182*  --  112*  --   --  134*  --  145* 143* 110*  BUN  --    < > 105*  --  99*  --   --  95*  --  82* 80* 79*  CREATININE  --    < > 5.70*  --  4.52*  --   --  3.60*  --  2.78* 2.37* 2.27*  CALCIUM  --    < > 7.8*  --  8.3*  --   --  8.8*  --  8.2* 8.0* 8.0*  MG 2.1  --  2.1  --   --  2.0 1.9 1.9 2.5*  --   --   --   PHOS 6.7*  --   --   --   --  5.5* 5.4* 4.2 3.3  --   --   --    < > = values in this interval not displayed.   Liver Function Tests: Recent Labs  Lab 04/29/23 0638 04/30/23 0027 05/01/23 0453  AST 61* 51* 28  ALT 33 32 27  ALKPHOS 76 78 70  BILITOT 0.6 0.6 0.5  PROT 5.8* 5.6* 5.2*  ALBUMIN 2.3* 2.4* 2.1*   ABG    Component Value Date/Time   PHART 7.353  05/01/2023 0437   PCO2ART 32.9 05/01/2023 0437   PO2ART 117 (H) 05/01/2023 0437   HCO3 18.3 (L) 05/01/2023 0437   TCO2 19 (L) 05/01/2023 0437   ACIDBASEDEF 6.0 (H) 05/01/2023 0437   O2SAT 98 05/01/2023 0437    Recent Labs  Lab 04/29/23 0638  CKTOTAL 2,671*    HbA1C: Hgb A1c MFr Bld  Date/Time Value Ref Range Status  12/16/2013 08:34 PM 5.4 <5.7 % Final    Comment:    (NOTE)                                                                       According to the ADA Clinical Practice Recommendations for 2011, when HbA1c is used as a screening test:  >=6.5%   Diagnostic of Diabetes Mellitus           (if abnormal result is confirmed) 5.7-6.4%   Increased risk of developing Diabetes Mellitus References:Diagnosis and Classification of Diabetes Mellitus,Diabetes Care,2011,34(Suppl 1):S62-S69 and Standards of Medical Care in         Diabetes - 2011,Diabetes Care,2011,34 (Suppl 1):S11-S61.    CBG: Recent Labs  Lab 05/04/23 1517 05/04/23 1932 05/04/23 2317 05/05/23  0324 05/05/23 0720  GLUCAP 151* 132* 137* 111* 109*    Critical care time:     Janeal Holmes, MD

## 2023-05-05 NOTE — Progress Notes (Signed)
Nutrition Follow-up  DOCUMENTATION CODES:   Severe malnutrition in context of chronic illness  INTERVENTION:   Continue tube feeds via Cortrak: - Vital 1.5 @ 65 ml/hr (1560 ml/day) - PROSource TF20 60 ml daily - Free water flushes per CCM, currently 200 ml q 6 hours  Tube feeding regimen provides 2420 kcal, 125 grams of protein, and 1192 ml of H2O.  Total free water with flushes: 1992 ml  NUTRITION DIAGNOSIS:   Severe Malnutrition related to chronic illness (CHF, CKD) as evidenced by severe fat depletion, severe muscle depletion.  Ongoing, being addressed via TF  GOAL:   Patient will meet greater than or equal to 90% of their needs  Met via TF  MONITOR:   Vent status, Labs, Weight trends, TF tolerance  REASON FOR ASSESSMENT:   Ventilator, Consult Enteral/tube feeding initiation and management  ASSESSMENT:   72 year old male who presented to the ED on 8/07 with AMS. 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 Pt tested positive for COVID-19 on admission. Pt admitted with sepsis due to pneumonia, COVID-19 infection, AKI on CKD stage IIIa.  8/08 - dysphagia 3 with thin liquids 8/10 - intubated 8/11 - TF initiated 8/12 - Cortrak placed (tip gastric antrum)  Discussed pt with RN and during ICU rounds. Pt with copious thick secretions from ETT. Renal function is improving. MD stopping IV fluids due to volume overload and giving 1 time does of lasix. Increasing volume of free water flushes due to ongoing hypernatremia.  Pt tolerating tube feeds at goal rate via Cortrak without issues at this time.  Admit weight: 89.6 kg Current weight: 99.9 kg  Current TF: Vital 1.5 @ 65 ml/hr, PROSource TF20 60 ml daily, free water flushes 200 ml every 6 hours  Patient remains intubated on ventilator support Temp (24hrs), Avg:99.1 F (37.3 C), Min:98.4 F (36.9 C), Max:99.7 F (37.6 C)  Drips: Propofol: 40 mcg/kg/min (provides  580 kcal daily from lipid) Fentanyl Heparin  Medications reviewed and include: decadron, IV lasix 40 mg x 1, IV protonix, IV abx  Labs reviewed: sodium 148, chloride 116, BUN 79, creatinine 2.27, WBC 12.9 CBG's: 109-151 x 24 hours  UOP: 1500 ml x 24 hours I/O's: +17.7 L since admit  Diet Order:   Diet Order     None       EDUCATION NEEDS:   Not appropriate for education at this time  Skin:  Skin Assessment: Reviewed RN Assessment  Last BM:  05/05/23 smear  Height:   Ht Readings from Last 1 Encounters:  04/30/23 6\' 1"  (1.854 m)    Weight:   Wt Readings from Last 1 Encounters:  05/05/23 99.9 kg    Ideal Body Weight:  83.6 kg  BMI:  Body mass index is 29.06 kg/m.  Estimated Nutritional Needs:   Kcal:  2300-2500  Protein:  115-130 grams  Fluid:  >2.0 L    Mertie Clause, MS, RD, LDN Registered Dietitian II Please see AMiON for contact information.

## 2023-05-05 NOTE — Progress Notes (Signed)
ANTICOAGULATION CONSULT NOTE- Follow Up  Pharmacy Consult for Heparin  Indication: atrial fibrillation  Allergies  Allergen Reactions   Aspirin Other (See Comments)    Stomach ulcers. No high doses   Nsaids Other (See Comments)    CAUSES STOMACH ULCERS     Patient Measurements: Height: 6\' 1"  (185.4 cm) Weight: 99.9 kg (220 lb 3.8 oz) IBW/kg (Calculated) : 79.9 Heparin dosing weight: 92.4 kg  Vital Signs: Temp: 98 F (36.7 C) (08/15 1509) Temp Source: Oral (08/15 1509) BP: 101/69 (08/15 1600) Pulse Rate: 78 (08/15 1630)  Labs: Recent Labs    05/03/23 0357 05/03/23 1256 05/04/23 0449 05/05/23 0517 05/05/23 1631  HGB 11.9*  --   --  11.1*  --   HCT 35.4*  --   --  33.8*  --   PLT 253  --   --  286  --   HEPARINUNFRC 0.80*   < > 0.48 0.75* 0.67  CREATININE 2.78*  --  2.37* 2.27*  --    < > = values in this interval not displayed.    Estimated Creatinine Clearance: 37.6 mL/min (A) (by C-G formula based on SCr of 2.27 mg/dL (H)).   Medical History: Past Medical History:  Diagnosis Date   Adrenal adenoma, left    Arthritis    Bursitis of elbow    Complication of anesthesia    ANESTHESIA DID NOT TAKE EFFECT WITH BACK SURGERY "I FELT THEM CUT"   Congenital absence of right kidney    Coronary artery disease    Diverticulosis of colon    Family history of anesthesia complication    "hard time waking up my dad" many yrs ago   Frequency of urination    Full dentures    GAD (generalized anxiety disorder)    GERD (gastroesophageal reflux disease)    History of acute renal failure    12-16-2013;  09-24-2017 due to stone obstruction   History of duodenal ulcer 04/2000   w/ gi bleed post egd w/ cauterization (caused by chronic nsaid use)   History of encephalopathy 04/26/2014   ACUTE EPISODE SECONDARY TO DRUG OVERDOSE (NARC, BZD, FLEXERIL)   History of GI bleed    08/ 2001   duodenal ulcer bleed post egd w/ cauterization   History of kidney stones    History of  prostatitis    Hyperlipidemia    Hypertension    Left bundle branch block 05/05/2021   Left ureteral stone    Nocturia more than twice per night    Peripheral neuropathy    bilateral leg numbness due to back issues, walks w/ cane   Pneumonia    PONV (postoperative nausea and vomiting)    Restless leg syndrome    Solitary left kidney    Wears glasses     Assessment: 72 y/o M with COVID-19, acute on chronic kidney failure, and new onset afib. Pharmacy consulted to manage heparin infusion.   HL 0.67 - therapeutic CBC: hgb 11.1, Plt 286 - low stable    Goal of Therapy:  Heparin level 0.3-0.7 units/ml Monitor platelets by anticoagulation protocol: Yes   Plan:  Continue heparin infusion at 1750 units/hr Daily heparin level, CBC F/u transition to enteral anticoagulation as appropriate  Calton Dach, PharmD, BCCCP Clinical Pharmacist 05/05/2023 5:08 PM

## 2023-05-06 DIAGNOSIS — J189 Pneumonia, unspecified organism: Secondary | ICD-10-CM | POA: Diagnosis not present

## 2023-05-06 DIAGNOSIS — I5022 Chronic systolic (congestive) heart failure: Secondary | ICD-10-CM | POA: Diagnosis not present

## 2023-05-06 DIAGNOSIS — I4891 Unspecified atrial fibrillation: Secondary | ICD-10-CM | POA: Diagnosis not present

## 2023-05-06 DIAGNOSIS — J9600 Acute respiratory failure, unspecified whether with hypoxia or hypercapnia: Secondary | ICD-10-CM | POA: Diagnosis not present

## 2023-05-06 LAB — BASIC METABOLIC PANEL
Anion gap: 11 (ref 5–15)
BUN: 79 mg/dL — ABNORMAL HIGH (ref 8–23)
CO2: 23 mmol/L (ref 22–32)
Calcium: 8.2 mg/dL — ABNORMAL LOW (ref 8.9–10.3)
Chloride: 114 mmol/L — ABNORMAL HIGH (ref 98–111)
Creatinine, Ser: 2.3 mg/dL — ABNORMAL HIGH (ref 0.61–1.24)
GFR, Estimated: 30 mL/min — ABNORMAL LOW (ref >60)
Glucose, Bld: 137 mg/dL — ABNORMAL HIGH (ref 70–99)
Potassium: 5.2 mmol/L — ABNORMAL HIGH (ref 3.5–5.1)
Sodium: 148 mmol/L — ABNORMAL HIGH (ref 135–145)

## 2023-05-06 LAB — GLUCOSE, CAPILLARY
Glucose-Capillary: 121 mg/dL — ABNORMAL HIGH (ref 70–99)
Glucose-Capillary: 109 mg/dL — ABNORMAL HIGH (ref 70–99)
Glucose-Capillary: 136 mg/dL — ABNORMAL HIGH (ref 70–99)

## 2023-05-06 LAB — HEPARIN LEVEL (UNFRACTIONATED): Heparin Unfractionated: 0.7 [IU]/mL (ref 0.30–0.70)

## 2023-05-06 MED ORDER — FREE WATER
200.0000 mL | Status: DC
  Administered 2023-05-06: 200 mL

## 2023-05-06 MED ORDER — GLYCOPYRROLATE 1 MG PO TABS: 1.0000 mg | ORAL_TABLET | ORAL | Status: DC | PRN

## 2023-05-06 MED ORDER — MIDAZOLAM BOLUS VIA INFUSION (WITHDRAWAL LIFE SUSTAINING TX): 2.0000 mg | INTRAVENOUS | Status: DC | PRN

## 2023-05-06 MED ORDER — SODIUM CHLORIDE 0.9 % IV SOLN: INTRAVENOUS | Status: DC

## 2023-05-06 MED ORDER — GLYCOPYRROLATE 0.2 MG/ML IJ SOLN
0.2000 mg | INTRAMUSCULAR | Status: DC | PRN
  Administered 2023-05-06: 0.2 mg via INTRAVENOUS
  Filled 2023-05-06: qty 1

## 2023-05-06 MED ORDER — ACETAMINOPHEN 325 MG PO TABS: 650.0000 mg | ORAL_TABLET | Freq: Four times a day (QID) | ORAL | Status: DC | PRN

## 2023-05-06 MED ORDER — MIDAZOLAM-SODIUM CHLORIDE 100-0.9 MG/100ML-% IV SOLN
0.0000 mg/h | INTRAVENOUS | Status: DC
  Administered 2023-05-06: 5 mg/h via INTRAVENOUS
  Filled 2023-05-06: qty 100

## 2023-05-06 MED ORDER — GLYCOPYRROLATE 0.2 MG/ML IJ SOLN: 0.2000 mg | INTRAMUSCULAR | Status: DC | PRN

## 2023-05-06 MED ORDER — FUROSEMIDE 10 MG/ML IJ SOLN
40.0000 mg | Freq: Two times a day (BID) | INTRAMUSCULAR | Status: DC
  Administered 2023-05-06: 40 mg via INTRAVENOUS
  Filled 2023-05-06: qty 4

## 2023-05-06 MED ORDER — FENTANYL 2500MCG IN NS 250ML (10MCG/ML) PREMIX INFUSION: 0.0000 ug/h | INTRAVENOUS | Status: DC

## 2023-05-06 MED ORDER — MIDAZOLAM HCL 2 MG/2ML IJ SOLN: 1.0000 mg | INTRAMUSCULAR | Status: DC | PRN

## 2023-05-06 MED ORDER — FENTANYL BOLUS VIA INFUSION: 100.0000 ug | INTRAVENOUS | Status: DC | PRN

## 2023-05-06 MED ORDER — POLYVINYL ALCOHOL 1.4 % OP SOLN: 1.0000 [drp] | Freq: Four times a day (QID) | OPHTHALMIC | Status: DC | PRN

## 2023-05-06 MED ORDER — FUROSEMIDE 10 MG/ML IJ SOLN: 40.0000 mg | Freq: Once | INTRAMUSCULAR | Status: DC

## 2023-05-06 MED ORDER — ACETAMINOPHEN 650 MG RE SUPP: 650.0000 mg | Freq: Four times a day (QID) | RECTAL | Status: DC | PRN

## 2023-05-06 MED ORDER — DEXMEDETOMIDINE HCL IN NACL 400 MCG/100ML IV SOLN
0.0000 ug/kg/h | INTRAVENOUS | Status: DC
  Administered 2023-05-06: 0.4 ug/kg/h via INTRAVENOUS
  Administered 2023-05-06: 0.8 ug/kg/h via INTRAVENOUS
  Filled 2023-05-06 (×2): qty 100

## 2023-05-19 ENCOUNTER — Ambulatory Visit: Payer: Medicare HMO | Admitting: Nurse Practitioner

## 2023-05-22 NOTE — Progress Notes (Addendum)
Pt placed back on full support due to desaturation by CCM. RT will monitor.

## 2023-05-22 NOTE — Progress Notes (Addendum)
NAME:  Bradley Morgan, MRN:  [id number], DOB:  Mar 15, 1951, LOS: 8 ADMISSION DATE:  04/27/2023, CONSULTATION DATE:  04/28/23 REFERRING MD:  Olevia Bowens, TRH, CHIEF COMPLAINT:  Resp distress   History of Present Illness:  Bradley Morgan is a 72 yo male with a PMH of CHF (EF 45%), HTN, HLD, CKD3a, and chronic LBBB who presented for progressive AMS, lethargy, and SOB over the last 7 days. Presented to ED and found to be afebrile, tachycardic to 140 with evidence of new-onset afib with RVR, tachypnea to 25, and hypoxemia to 90-93% O2 requiring 3L. WBC 22.6, Cr 6.6, lactic acid 3.2. Patient is COVID+. VBG with normal pH, low CO2, low bicarb. CXR with large consolidation in RLL. CT head normal. He was admitted to progressive on IV azithro and CTX for PNA. Outside window for remdesevir. Rates more reassuring at that time so no rate control given. Once on the floor, rapid was called for worsening respiratory distress now requiring 6L. He remained in afib with RVR. PCCM was ultimately consulted for further recommendations.   Pertinent  Medical History   Past Medical History:  Diagnosis Date   Adrenal adenoma, left    Arthritis    Bursitis of elbow    Complication of anesthesia    ANESTHESIA DID NOT TAKE EFFECT WITH BACK SURGERY "I FELT THEM CUT"   Congenital absence of right kidney    Coronary artery disease    Diverticulosis of colon    Family history of anesthesia complication    "hard time waking up my dad" many yrs ago   Frequency of urination    Full dentures    GAD (generalized anxiety disorder)    GERD (gastroesophageal reflux disease)    History of acute renal failure    12-16-2013;  09-24-2017 due to stone obstruction   History of duodenal ulcer 04/2000   w/ gi bleed post egd w/ cauterization (caused by chronic nsaid use)   History of encephalopathy 04/26/2014   ACUTE EPISODE SECONDARY TO DRUG OVERDOSE (NARC, BZD, FLEXERIL)   History of GI bleed    08/ 2001   duodenal ulcer bleed post egd w/  cauterization   History of kidney stones    History of prostatitis    Hyperlipidemia    Hypertension    Left bundle branch block 05/05/2021   Left ureteral stone    Nocturia more than twice per night    Peripheral neuropathy    bilateral leg numbness due to back issues, walks w/ cane   Pneumonia    PONV (postoperative nausea and vomiting)    Restless leg syndrome    Solitary left kidney    Wears glasses    Significant Hospital Events: Including procedures, antibiotic start and stop dates in addition to other pertinent events   8/8 - admitted with PNA on IV abx, resp distress worsened requiring 3>6L O2 necessitating PCCM consult 04/29/2023 transferred to intensive care unit 8/10 Intubated for respiratory failure 8/14 remained intubated due to continued secretions, abx switched from unasyn to meropenem/linezolid due to sensitivities  Interim History / Subjective:  Pt placed on PSV then back to PRVC  Objective   Blood pressure 135/64, pulse 81, temperature 98.4 F (36.9 C), temperature source Oral, resp. rate (!) 22, height 6\' 1"  (1.854 m), weight 99.9 kg, SpO2 90%.    Vent Mode: PRVC FiO2 (%):  [40 %-50 %] 40 % Set Rate:  [18 bmp] 18 bmp Vt Set:  [409 mL] 630 mL PEEP:  [5  cmH20-8 cmH20] 5 cmH20 Pressure Support:  [8 cmH20] 8 cmH20 Plateau Pressure:  [18 cmH20-21 cmH20] 18 cmH20   Intake/Output Summary (Last 24 hours) at 04/25/2023 1020 Last data filed at 04/27/2023 0900 Gross per 24 hour  Intake 2739.81 ml  Output 1650 ml  Net 1089.81 ml   Filed Weights   05/04/23 0440 05/05/23 0319 05/15/2023 0319  Weight: 95.5 kg 99.9 kg 99.9 kg    Examination: General: Lying in bed, NAD HENT: ETT in place, NCAT Lungs: Coarse breath sounds bilaterally. Increased WOB on vent Cardiovascular: Normal rate, irregularly irregular rhythm, no m/r/g Abdomen: Soft, nondistended Extremities: Mild pitting edema to bilateral LE around ankles Neuro: Sedated, does not respond to verbal  stimuli   Resolved Hospital Problem list   Hyponatremia Hyperphosphatemia  Assessment & Plan:  Sepsis due to streptococcal pneumonia, POA Acute hypoxemic respiratory failure  Acute septic encephalopathy COVID-19 pneumonia Continue full support ventilation PRVC Hypervolemic - diurese with IV Lasix 40mg  BID today, monitor I/O and kidney function Switch propofol to precedex. Continue fentanyl. Seroquel as below to help wean Daily SBT VAP prevention bundle in place Repeat respiratory culture with abundant staph aureus and multidrug resistant klebsiella, unasyn switched to meropenem and linezolid (8/14-), trial for 1-2 days and assess improvement before discussing extubation Continue IV Decadron for 10 days therapy - stop date in place  Continue Pulmicort and Xopenex nebs   Paroxysmal atrial fibrillation with RVR Chronic LBBB 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%) HTN Oral amiodarone 200mg  BID x10d, then 200mg  daily Coreg 3.125 mg daily Appreciate cardiology input, signed off now Continue IV heparin infusion per pharmacy    AKI on CKD3a HAGMA Solitary kidney Hypernatremia  IVF stopped given evolving overload and CHF history S/p IV lasix 40mg  8/15, 1.65L output last 24h, receiving additional 40mg  x2 today. Will likely also help with slight hyperkalemia 5.2 Serum creatinine 2.30, good UOP  Na 148 - Continue free water bolus, 200 mL Q4H F/u chem  S/p bicarb Avoid nephrotoxic agents Appreciate nephrology (signed off)   AMS - agitated delirium  PAD protocol goal RASS -1 precedex, fentanyl gtts  Wean as able, daily WUA Seroquel added to address patient's delirium and wean off sedation   Chronic pain Continue gabapentin 100 mg BID Holding home flexiril Fentanyl gtt    Severe protein calorie malnutrition TF  Dietitian is following  Best Practice (right click and "Reselect all SmartList Selections" daily)   Diet/type: tubefeeds DVT  prophylaxis: systemic heparin GI prophylaxis: PPI Lines: Central line and yes and it is still needed Foley:  N/A, external Code Status:  DNR Last date of multidisciplinary goals of care discussion [8/15 - daughter and son in law updated on unit, discussing having family meeting soon]  Labs   CBC: Recent Labs  Lab 04/30/23 0027 04/30/23 1947 05/01/23 0437 05/01/23 0453 05/02/23 0319 05/03/23 0357 05/05/23 0517  WBC 17.2*  --   --  11.6* 11.8* 20.7* 12.9*  HGB 12.5*   < > 11.6* 11.8* 11.6* 11.9* 11.1*  HCT 36.6*   < > 34.0* 35.3* 34.7* 35.4* 33.8*  MCV 85.7  --   --  87.6 88.5 91.7 92.3  PLT 259  --   --  274 258 253 286   < > = values in this interval not displayed.    Basic Metabolic Panel: Recent Labs  Lab 04/30/23 0027 04/30/23 1947 05/01/23 1149 05/01/23 1719 05/02/23 0319 05/02/23 1651 05/03/23 0357 05/04/23 0449 05/05/23 0517 05/12/2023 0756  NA  131*   < >  --   --  144  --  144 147* 148* 148*  K 3.8   < >  --   --  4.5  --  3.9 4.2 4.8 5.2*  CL 97*   < >  --   --  106  --  109 115* 116* 114*  CO2 18*   < >  --   --  21*  --  21* 22 24 23   GLUCOSE 182*   < >  --   --  134*  --  145* 143* 110* 137*  BUN 105*   < >  --   --  95*  --  82* 80* 79* 79*  CREATININE 5.70*   < >  --   --  3.60*  --  2.78* 2.37* 2.27* 2.30*  CALCIUM 7.8*   < >  --   --  8.8*  --  8.2* 8.0* 8.0* 8.2*  MG 2.1  --  2.0 1.9 1.9 2.5*  --   --   --   --   PHOS  --   --  5.5* 5.4* 4.2 3.3  --   --   --   --    < > = values in this interval not displayed.   Liver Function Tests: Recent Labs  Lab 04/30/23 0027 05/01/23 0453  AST 51* 28  ALT 32 27  ALKPHOS 78 70  BILITOT 0.6 0.5  PROT 5.6* 5.2*  ALBUMIN 2.4* 2.1*   ABG    Component Value Date/Time   PHART 7.353 05/01/2023 0437   PCO2ART 32.9 05/01/2023 0437   PO2ART 117 (H) 05/01/2023 0437   HCO3 18.3 (L) 05/01/2023 0437   TCO2 19 (L) 05/01/2023 0437   ACIDBASEDEF 6.0 (H) 05/01/2023 0437   O2SAT 98 05/01/2023 0437    No  results for input(s): "CKTOTAL", "CKMB", "CKMBINDEX", "TROPONINI" in the last 168 hours.   HbA1C: Hgb A1c MFr Bld  Date/Time Value Ref Range Status  12/16/2013 08:34 PM 5.4 <5.7 % Final    Comment:    (NOTE)                                                                       According to the ADA Clinical Practice Recommendations for 2011, when HbA1c is used as a screening test:  >=6.5%   Diagnostic of Diabetes Mellitus           (if abnormal result is confirmed) 5.7-6.4%   Increased risk of developing Diabetes Mellitus References:Diagnosis and Classification of Diabetes Mellitus,Diabetes Care,2011,34(Suppl 1):S62-S69 and Standards of Medical Care in         Diabetes - 2011,Diabetes Care,2011,34 (Suppl 1):S11-S61.    CBG: Recent Labs  Lab 05/05/23 1505 05/05/23 1910 05/05/23 2317 05/10/2023 0312 05/21/2023 0711  GLUCAP 130* 128* 129* 121* 109*    Critical care time:     Vonna Drafts, MD

## 2023-05-22 NOTE — Progress Notes (Signed)
ANTICOAGULATION CONSULT NOTE- Follow Up  Pharmacy Consult for Heparin  Indication: atrial fibrillation  Allergies  Allergen Reactions   Aspirin Other (See Comments)    Stomach ulcers. No high doses   Nsaids Other (See Comments)    CAUSES STOMACH ULCERS     Patient Measurements: Height: 6\' 1"  (185.4 cm) Weight: 99.9 kg (220 lb 3.8 oz) IBW/kg (Calculated) : 79.9 Heparin dosing weight: 92.4 kg  Vital Signs: Temp: 98.6 F (37 C) (08/16 0400) Temp Source: Oral (08/16 0400) BP: 109/68 (08/16 0300) Pulse Rate: 72 (08/16 0300)  Labs: Recent Labs    05/04/23 0449 05/05/23 0517 05/05/23 1631 05/15/2023 0318  HGB  --  11.1*  --   --   HCT  --  33.8*  --   --   PLT  --  286  --   --   HEPARINUNFRC 0.48 0.75* 0.67 0.70  CREATININE 2.37* 2.27*  --   --     Estimated Creatinine Clearance: 37.6 mL/min (A) (by C-G formula based on SCr of 2.27 mg/dL (H)).   Medical History: Past Medical History:  Diagnosis Date   Adrenal adenoma, left    Arthritis    Bursitis of elbow    Complication of anesthesia    ANESTHESIA DID NOT TAKE EFFECT WITH BACK SURGERY "I FELT THEM CUT"   Congenital absence of right kidney    Coronary artery disease    Diverticulosis of colon    Family history of anesthesia complication    "hard time waking up my dad" many yrs ago   Frequency of urination    Full dentures    GAD (generalized anxiety disorder)    GERD (gastroesophageal reflux disease)    History of acute renal failure    12-16-2013;  09-24-2017 due to stone obstruction   History of duodenal ulcer 04/2000   w/ gi bleed post egd w/ cauterization (caused by chronic nsaid use)   History of encephalopathy 04/26/2014   ACUTE EPISODE SECONDARY TO DRUG OVERDOSE (NARC, BZD, FLEXERIL)   History of GI bleed    08/ 2001   duodenal ulcer bleed post egd w/ cauterization   History of kidney stones    History of prostatitis    Hyperlipidemia    Hypertension    Left bundle branch block 05/05/2021    Left ureteral stone    Nocturia more than twice per night    Peripheral neuropathy    bilateral leg numbness due to back issues, walks w/ cane   Pneumonia    PONV (postoperative nausea and vomiting)    Restless leg syndrome    Solitary left kidney    Wears glasses     Assessment: 72 y/o M with COVID-19, acute on chronic kidney failure, and new onset afib. Pharmacy consulted to manage heparin infusion.    Heparin level 0.7 with infusion at 1750 units/hr [therapeutic, upper end of goal range]. No new labs this AM (8/16). 8/15 Hgb 11.1 and plt 286, low stable. No signs/symptoms bleeding noted.    Goal of Therapy:  Heparin level 0.3-0.7 units/ml Monitor platelets by anticoagulation protocol: Yes   Plan:  Decrease heparin infusion to 1650 units/hr given current trends Daily heparin level, CBC F/u transition to enteral anticoagulation as appropriate  Stephenie Acres, PharmD PGY1 Pharmacy Resident 05/02/2023 6:40 AM

## 2023-05-22 NOTE — Progress Notes (Signed)
Pt placed on PS/CPAP 8/5 on 40% and is tolerating okay. RT will monitor.

## 2023-05-22 NOTE — Progress Notes (Signed)
Increased amount of blood noted in ETT after suctioning. Heparin gtt stopped (to be held for 3 hours) per MD and pharmacy.

## 2023-05-22 NOTE — Discharge Summary (Signed)
DEATH SUMMARY   Patient Details  Name: Bradley Morgan MRN: [id number] DOB: 1951-09-02  Admission/Discharge Information   Admit Date:  2023/05/14  Date of Death: Date of Death: 23-May-2023  Time of Death: Time of Death: 1640  Length of Stay: 8  Referring Physician: Barbie Banner, MD   Reason(s) for Hospitalization  Pneumonia   Diagnoses  Preliminary cause of death: Bacterial pneumonia.  Secondary Diagnoses (including complications and co-morbidities):  Principal Problem:   Community acquired pneumonia of right lower lobe of lung Active Problems:   Essential hypertension, benign   Pure hypercholesterolemia   Chronic systolic heart failure (HCC)   Acute respiratory failure with hypoxia (HCC)   AKI (acute kidney injury) (HCC)   Paroxysmal atrial fibrillation with RVR (HCC)   Protein-calorie malnutrition, severe   Brief Hospital Course (including significant findings, care, treatment, and services provided and events leading to death)  COTTRELL LHUILLIER is a 72 y.o. year old male who has a history of CHF (EF 45%), HTN, HLD, CKD3a, and chronic LBBB who presented for progressive AMS, lethargy, and SOB over the last 7 days. He was COVID positive and had a RLL which ultimately grew pneumococccus from sputum. He required mechanical ventilation and sedation. He was treated with remdesivir, steroids and antibiotics. After a week on mechanical ventilation weaning was still limited by agitated delirium, volume overload and a new HCAP.   The family at this point was concerned that his trajectory was towards further debility for a man who was already in severe chronic pain and who had been losing weight and functional capacity for nearly one year.  It was then decided to transition to comfort care and the patient with compassionately extubated.   Pertinent Labs and Studies  Significant Diagnostic Studies DG Chest Port 1 View  Result Date: 05/05/2023 CLINICAL DATA:  Respiratory failure. EXAM:  PORTABLE CHEST 1 VIEW COMPARISON:  04/30/2023 FINDINGS: Endotracheal tube tip is 4.9 cm above the base of the carina. A feeding tube passes into the stomach although the distal tip position is not included on the film. Left IJ central line tip overlies the expected region of the innominate vein confluence. Cardiopericardial silhouette is at upper limits of normal for size. Retrocardiac left base collapse/consolidation is similar to prior as is the collapse/consolidative disease in the right middle and lower lobes. Small bilateral pleural effusions evident. IMPRESSION: 1. No substantial interval change. 2. Persistent collapse/consolidation in the right middle and lower lobes with small bilateral pleural effusions. Electronically Signed   By: Kennith Center M.D.   On: 05/05/2023 09:14   DG Abd Portable 1V  Result Date: 05/02/2023 CLINICAL DATA:  Feeding tube placement EXAM: PORTABLE ABDOMEN - 1 VIEW COMPARISON:  04/30/2023 FINDINGS: There is interval removal of NG tube and placement of feeding tube with its tip in the antrum of the stomach. Bowel gas pattern in the upper abdomen is unremarkable. There is blunting of right lateral CP angle. Linear densities are seen in right lower lung field. IMPRESSION: Tip of feeding tube is seen in the antrum of the stomach. Electronically Signed   By: Ernie Avena M.D.   On: 05/02/2023 15:37   DG Abd Portable 1V  Result Date: 04/30/2023 CLINICAL DATA:  [id number].  Encounter for orogastric tube placement. EXAM: PORTABLE ABDOMEN - 1 VIEW COMPARISON:  Earlier study today at 7:23 p.m. FINDINGS: Single high transverse abdomen at 9:33 p.m. NG tube enters the stomach coursing to the left with the tip abutting the wall of  the proximal fundus. The bowel pattern is nonobstructive. There are patchy calcifications in the aorta and splenic artery. No nephrolithiasis is seen. There is no supine evidence of free air. There is advanced degenerative change and levoscoliosis of the lumbar  spine. Consolidation in the left lower lobe is present and a small left pleural effusion. IMPRESSION: 1. NG tube enters the stomach coursing to the left with the tip abutting the wall of the proximal fundus. 2. Nonobstructive bowel gas pattern.  Incidental findings as above. Electronically Signed   By: Almira Bar M.D.   On: 04/30/2023 21:46   DG Abd Portable 1V  Result Date: 04/30/2023 CLINICAL DATA:  Check gastric catheter placement EXAM: PORTABLE ABDOMEN - 1 VIEW COMPARISON:  None Available. FINDINGS: Gastric catheter is noted in the distal esophagus. This should be advanced several cm deeper into the stomach. IMPRESSION: Gastric catheter in the distal esophagus as described. Electronically Signed   By: Alcide Clever M.D.   On: 04/30/2023 19:45   DG CHEST PORT 1 VIEW  Result Date: 04/30/2023 CLINICAL DATA:  Check gastric catheter placement EXAM: PORTABLE CHEST 1 VIEW COMPARISON:  04/29/2023 FINDINGS: Endotracheal tube is noted in satisfactory position. Left jugular central line is seen with the catheter tip at the junction of the innominate veins. No pneumothorax is noted. Gastric catheter lies in the distal esophagus and should be advanced several cm deeper into the stomach. Persistent airspace opacity is noted in the bases bilaterally somewhat improved on the right but somewhat worsened in the left retrocardiac region. No other focal abnormality is noted. IMPRESSION: Tubes and lines as described above. Gastric catheter should be advanced deeper into the stomach. Bibasilar airspace opacities somewhat improved on the right and somewhat worsened on the left. Electronically Signed   By: Alcide Clever M.D.   On: 04/30/2023 19:44   [place] EKG SITE RITE  Result Date: 04/30/2023 If Site Rite image not attached, placement could not be confirmed due to current cardiac rhythm.  DG Chest Port 1V same Day  Result Date: 04/29/2023 CLINICAL DATA:  72 year old male with shortness of breath. EXAM: PORTABLE CHEST 1  VIEW COMPARISON:  Portable chest 04/28/2023 and earlier. FINDINGS: Portable AP semi upright view at 0652 hours. Ongoing low lung volumes. Stable mediastinal contours with suspicion of cardiomegaly. Visualized tracheal air column is within normal limits. Asymmetry of the diaphragm which might reflect mild elevation on the right side, although small right pleural effusion is difficult to exclude. Confluent medial right lung base airspace disease seen on 04/27/2023 likely on going, with possible air bronchograms along the right heart border. Upper lungs remain clear. No pneumothorax. Stable vascularity without edema. No definite left lung consolidation. Paucity of bowel gas in the upper abdomen. Stable visualized osseous structures. Postoperative changes to the cervical spine and right shoulder. IMPRESSION: 1. Evidence of ongoing right lung base consolidation/pneumonia. Some underlying chronic elevation of the right hemidiaphragm. Small if any right pleural effusion. 2. Low lung volumes with no new cardiopulmonary abnormality identified. Electronically Signed   By: Odessa Fleming M.D.   On: 04/29/2023 07:10   ECHOCARDIOGRAM COMPLETE  Result Date: 04/28/2023    ECHOCARDIOGRAM REPORT   Patient Name:   ELDER SZAFRAN Date of Exam: 04/28/2023 Medical Rec #:  [id number]       Height:       73.0 in Accession #:    [id number]      Weight:       197.5 lb Date of Birth:  1951-05-26  BSA:          2.140 m Patient Age:    72 years        BP:           107/81 mmHg Patient Gender: M               HR:           130 bpm. Exam Location:  Inpatient Procedure: 2D Echo, Cardiac Doppler and Color Doppler Indications:    I49.9* Cardiac arrhythmia, unspecified  History:        Patient has prior history of Echocardiogram examinations, most                 recent 06/09/2021. Abnormal ECG; Signs/Symptoms:Covid.  Sonographer:    Darlys Gales Referring Phys: Youlanda Roys  Sonographer Comments: Suboptimal parasternal window. IMPRESSIONS  1.  Spontaneous echo contrast noted in LV, concerning for low flow state. Left ventricular ejection fraction, by estimation, is 25-30% with septal/lateral dyssynchrony. The left ventricle has severely decreased function. The left ventricle demonstrates global hypokinesis. There is moderate left ventricular hypertrophy. Left ventricular diastolic parameters are indeterminate.  2. Right ventricular systolic function is mildly reduced. The right ventricular size is normal.  3. Left atrial size was moderately dilated.  4. A small pericardial effusion is present.  5. The mitral valve is grossly normal. Trivial mitral valve regurgitation. No evidence of mitral stenosis.  6. The aortic valve is grossly normal. There is mild thickening of the aortic valve. Aortic valve regurgitation is mild. No aortic stenosis is present.  7. IVC not well visualized. FINDINGS  Left Ventricle: Spontaneous echo contrast noted in LV, concerning for low flow state. Left ventricular ejection fraction, by estimation, is 25 to 30%. The left ventricle has severely decreased function. The left ventricle demonstrates global hypokinesis. The left ventricular internal cavity size was normal in size. There is moderate left ventricular hypertrophy. Abnormal (paradoxical) septal motion, consistent with left bundle branch block. Left ventricular diastolic parameters are indeterminate. Right Ventricle: The right ventricular size is normal. Right vetricular wall thickness was not well visualized. Right ventricular systolic function is mildly reduced. Left Atrium: Left atrial size was moderately dilated. Right Atrium: Right atrial size was normal in size. Pericardium: A small pericardial effusion is present. Mitral Valve: The mitral valve is grossly normal. Mild mitral annular calcification. Trivial mitral valve regurgitation. No evidence of mitral valve stenosis. Tricuspid Valve: The tricuspid valve is grossly normal. Tricuspid valve regurgitation is mild.  Aortic Valve: The aortic valve is grossly normal. There is mild thickening of the aortic valve. Aortic valve regurgitation is mild. No aortic stenosis is present. Pulmonic Valve: The pulmonic valve was not well visualized. Pulmonic valve regurgitation is trivial. Aorta: The aortic root was not well visualized. Venous: IVC not well visualized. The inferior vena cava was not well visualized. IAS/Shunts: The interatrial septum was not well visualized.  LEFT VENTRICLE PLAX 2D LVIDd:         3.50 cm LV PW:         1.20 cm LV IVS:        1.54 cm LVOT diam:     2.00 cm LVOT Area:     3.14 cm  LEFT ATRIUM             Index LA Vol (A2C):   65.3 ml 30.51 ml/m LA Vol (A4C):   56.5 ml 26.40 ml/m LA Biplane Vol: 62.2 ml 29.06 ml/m  TRICUSPID VALVE TR Peak grad:  25.8 mmHg TR Vmax:        254.00 cm/s  SHUNTS Systemic Diam: 2.00 cm Weston Brass MD Electronically signed by Weston Brass MD Signature Date/Time: 04/28/2023/5:41:31 PM    Final    DG Chest Port 1 View  Result Date: 04/28/2023 CLINICAL DATA:  Shortness of breath EXAM: PORTABLE CHEST 1 VIEW COMPARISON:  Chest x-ray 04/27/2023 FINDINGS: The heart is enlarged, unchanged. Small right pleural effusion has decreased. Right basilar infiltrate has resolved. Left lung is clear. No pneumothorax. Osseous structures are stable. IMPRESSION: Small right pleural effusion has decreased. Right basilar infiltrate has resolved. Electronically Signed   By: Darliss Cheney M.D.   On: 04/28/2023 15:46   US RENAL  Result Date: 04/28/2023 CLINICAL DATA:  Acute kidney injury. EXAM: RENAL / URINARY TRACT ULTRASOUND COMPLETE COMPARISON:  None Available. FINDINGS: Right Kidney: The right kidney is not visualized. Left Kidney: Renal measurements: 11.2 cm x 5.7 cm x 7.4 cm = volume: 245.4 mL. Echogenicity within normal limits. A 1.6 cm x 1.2 cm x 1.3 cm simple cyst is seen within the left kidney. No abnormal flow is noted within this region on color Doppler evaluation. No hydronephrosis  is visualized. Bladder: A mild amount of echogenic debris is seen within the dependent portion of the gallbladder lumen. Other: None. IMPRESSION: 1. Nonvisualization of the right kidney. 2. 1.6 cm x 1.2 cm x 1.3 cm simple left renal cyst. 3. Mild amount of echogenic debris within the urinary bladder. A small amount of blood products cannot be excluded. Correlation with urinalysis is recommended. Electronically Signed   By: Aram Candela M.D.   On: 04/28/2023 03:16   CT Head Wo Contrast  Result Date: 04/27/2023 CLINICAL DATA:  Altered mental status. EXAM: CT HEAD WITHOUT CONTRAST TECHNIQUE: Contiguous axial images were obtained from the base of the skull through the vertex without intravenous contrast. RADIATION DOSE REDUCTION: This exam was performed according to the departmental dose-optimization program which includes automated exposure control, adjustment of the mA and/or kV according to patient size and/or use of iterative reconstruction technique. COMPARISON:  April 26, 2014 FINDINGS: Brain: There is mild cerebral atrophy with widening of the extra-axial spaces and ventricular dilatation. There are areas of decreased attenuation within the white matter tracts of the supratentorial brain, consistent with microvascular disease changes. Vascular: No hyperdense vessel or unexpected calcification. Skull: Normal. Negative for fracture or focal lesion. Sinuses/Orbits: There is mild left maxillary sinus mucosal thickening. Other: None. IMPRESSION: 1. Generalized cerebral atrophy with mild, chronic white matter small vessel ischemic changes. 2. No acute intracranial abnormality. 3. Mild left maxillary sinus disease. Electronically Signed   By: Aram Candela M.D.   On: 04/27/2023 23:58   DG Chest 2 View  Result Date: 04/27/2023 CLINICAL DATA:  Suspected sepsis. EXAM: CHEST - 2 VIEW COMPARISON:  Chest radiograph 10/04/2014. FINDINGS: Dense consolidation in the right lung base likely involving both right  middle and lower lobes. Suspected air bronchograms. Mild peribronchial thickening. Streaky atelectasis at the left lung base. Grossly stable heart size and mediastinal contours. No large pleural effusion. No pneumothorax. Cervical spine hardware is partially included. IMPRESSION: Dense consolidation in the right lung base likely involving both right middle and lower lobes, suspicious for pneumonia. Followup PA and lateral chest X-ray is recommended in 3-4 weeks following trial of antibiotic therapy to ensure resolution and exclude underlying malignancy. Electronically Signed   By: Narda Rutherford M.D.   On: 04/27/2023 23:45    Microbiology Recent Results (from the past  240 hour(s))  Culture, Respiratory w Gram Stain     Status: None   Collection Time: 05/02/23  9:56 AM   Specimen: Tracheal Aspirate; Respiratory  Result Value Ref Range Status   Specimen Description TRACHEAL ASPIRATE  Final   Special Requests NONE  Final   Gram Stain   Final    FEW WBC PRESENT, PREDOMINANTLY PMN RARE GRAM VARIABLE ROD Performed at [facility], [address] [place], [place] [address]    Culture   Final    ABUNDANT KLEBSIELLA SPECIES ABUNDANT STAPHYLOCOCCUS AUREUS    Report Status 05/14/2023 FINAL  Final   Organism ID, Bacteria KLEBSIELLA SPECIES  Final   Organism ID, Bacteria STAPHYLOCOCCUS AUREUS  Final      Susceptibility   Klebsiella species - MIC*    AMPICILLIN >=32 RESISTANT Resistant     CEFEPIME >=32 RESISTANT Resistant     CEFTAZIDIME 16 INTERMEDIATE Intermediate     CEFTRIAXONE >=64 RESISTANT Resistant     CIPROFLOXACIN 2 RESISTANT Resistant     GENTAMICIN >=16 RESISTANT Resistant     IMIPENEM <=0.25 SENSITIVE Sensitive     TRIMETH/SULFA >=320 RESISTANT Resistant     AMPICILLIN/SULBACTAM >=32 RESISTANT Resistant     PIP/TAZO 16 SENSITIVE Sensitive     * ABUNDANT KLEBSIELLA SPECIES   Staphylococcus aureus - MIC*    CIPROFLOXACIN <=0.5 SENSITIVE Sensitive     ERYTHROMYCIN >=8  RESISTANT Resistant     GENTAMICIN <=0.5 SENSITIVE Sensitive     OXACILLIN 1 SENSITIVE Sensitive     TETRACYCLINE <=1 SENSITIVE Sensitive     VANCOMYCIN <=0.5 SENSITIVE Sensitive     TRIMETH/SULFA <=10 SENSITIVE Sensitive     CLINDAMYCIN <=0.25 SENSITIVE Sensitive     RIFAMPIN <=0.5 SENSITIVE Sensitive     Inducible Clindamycin NEGATIVE Sensitive     LINEZOLID 2 SENSITIVE Sensitive     * ABUNDANT STAPHYLOCOCCUS AUREUS    Lab Basic Metabolic Panel: Recent Labs  Lab 05/02/23 1651 05/03/23 0357 05/04/23 0449 05/05/23 0517 05/17/2023 0756  NA  --  144 147* 148* 148*  K  --  3.9 4.2 4.8 5.2*  CL  --  109 115* 116* 114*  CO2  --  21* 22 24 23   GLUCOSE  --  145* 143* 110* 137*  BUN  --  82* 80* 79* 79*  CREATININE  --  2.78* 2.37* 2.27* 2.30*  CALCIUM  --  8.2* 8.0* 8.0* 8.2*  MG 2.5*  --   --   --   --   PHOS 3.3  --   --   --   --    Liver Function Tests: No results for input(s): "AST", "ALT", "ALKPHOS", "BILITOT", "PROT", "ALBUMIN" in the last 168 hours. No results for input(s): "LIPASE", "AMYLASE" in the last 168 hours. No results for input(s): "AMMONIA" in the last 168 hours. CBC: Recent Labs  Lab 05/03/23 0357 05/05/23 0517  WBC 20.7* 12.9*  HGB 11.9* 11.1*  HCT 35.4* 33.8*  MCV 91.7 92.3  PLT 253 286   Cardiac Enzymes: No results for input(s): "CKTOTAL", "CKMB", "CKMBINDEX", "TROPONINI" in the last 168 hours. Sepsis Labs: Recent Labs  Lab 05/03/23 0357 05/05/23 0517  WBC 20.7* 12.9*    Procedures/Operations  Mechanical ventilation.    Berton Butrick 05/09/2023, 1:48 PM

## 2023-05-22 NOTE — Procedures (Signed)
Extubation Procedure Note  Patient Details:   Name: Bradley Morgan DOB: 1951-08-30 MRN: [id number]   Airway Documentation:    Vent end date: 05/19/2023 Vent end time: 1445   Pt extubated to comfort care with RN and family at bedside per order.  [name] 05/16/2023, 3:08 PM

## 2023-05-22 NOTE — TOC Progression Note (Signed)
Transition of Care [facility]) - Progression Note    Patient Details  Name: Bradley Morgan MRN: [id number] Date of Birth: 1951/08/24  Transition of [facility]) CM/SW Contact  Tom-Johnson, Hershal Coria, RN Phone Number: 05/14/2023, 3:54 PM  Clinical Narrative:     Patient extubated, transitioned to Comfort Care per family's request.  CM will continue to follow and render compassionate support at this time.        Expected Discharge Plan and Services                                               Social Determinants of Health (SDOH) Interventions SDOH Screenings   Tobacco Use: Medium Risk (04/27/2023)    Readmission Risk Interventions    05/02/2023    2:20 PM  Readmission Risk Prevention Plan  Transportation Screening Complete  PCP or Specialist Appt within 5-7 Days Complete  Home Care Screening Complete  Medication Review (RN CM) Referral to Pharmacy

## 2023-05-22 DEATH — deceased
